# Patient Record
Sex: Female | Born: 1960
Health system: Southern US, Community
[De-identification: ages and names within clinical notes are randomized; demographics above are authoritative.]

## PROBLEM LIST (undated history)

## (undated) DIAGNOSIS — F32A Depression, unspecified: Secondary | ICD-10-CM

## (undated) DIAGNOSIS — G5700 Lesion of sciatic nerve, unspecified lower limb: Secondary | ICD-10-CM

## (undated) DIAGNOSIS — J45909 Unspecified asthma, uncomplicated: Secondary | ICD-10-CM

## (undated) DIAGNOSIS — E785 Hyperlipidemia, unspecified: Secondary | ICD-10-CM

## (undated) DIAGNOSIS — I10 Essential (primary) hypertension: Secondary | ICD-10-CM

## (undated) DIAGNOSIS — R0600 Dyspnea, unspecified: Secondary | ICD-10-CM

## (undated) DIAGNOSIS — E079 Disorder of thyroid, unspecified: Secondary | ICD-10-CM

## (undated) DIAGNOSIS — E039 Hypothyroidism, unspecified: Secondary | ICD-10-CM

## (undated) HISTORY — DX: Lesion of sciatic nerve, unspecified lower limb: G57.00

## (undated) HISTORY — DX: Unspecified asthma, uncomplicated: J45.909

## (undated) HISTORY — PX: STRABISMUS SURGERY: SHX218

## (undated) HISTORY — DX: Disorder of thyroid, unspecified: E07.9

## (undated) HISTORY — DX: Hyperlipidemia, unspecified: E78.5

## (undated) HISTORY — PX: FOOT SURGERY: SHX648

## (undated) HISTORY — DX: Essential (primary) hypertension: I10

## (undated) HISTORY — PX: TOTAL ABDOMINAL HYSTERECTOMY: SHX209

---

## 1998-11-02 ENCOUNTER — Encounter: Admission: RE | Admit: 1998-11-02 | Discharge: 1998-11-02 | Payer: Self-pay | Admitting: Internal Medicine

## 1998-11-30 ENCOUNTER — Emergency Department (HOSPITAL_COMMUNITY): Admission: EM | Admit: 1998-11-30 | Discharge: 1998-11-30 | Payer: Self-pay | Admitting: Emergency Medicine

## 1998-11-30 ENCOUNTER — Encounter: Payer: Self-pay | Admitting: Emergency Medicine

## 1999-05-02 ENCOUNTER — Emergency Department (HOSPITAL_COMMUNITY): Admission: EM | Admit: 1999-05-02 | Discharge: 1999-05-02 | Payer: Self-pay | Admitting: Emergency Medicine

## 2000-09-04 ENCOUNTER — Other Ambulatory Visit: Admission: RE | Admit: 2000-09-04 | Discharge: 2000-09-04 | Payer: Self-pay | Admitting: Obstetrics and Gynecology

## 2003-01-19 ENCOUNTER — Emergency Department (HOSPITAL_COMMUNITY): Admission: EM | Admit: 2003-01-19 | Discharge: 2003-01-20 | Payer: Self-pay | Admitting: Emergency Medicine

## 2003-01-20 ENCOUNTER — Encounter: Payer: Self-pay | Admitting: Emergency Medicine

## 2003-07-07 ENCOUNTER — Ambulatory Visit (HOSPITAL_COMMUNITY): Admission: RE | Admit: 2003-07-07 | Discharge: 2003-07-07 | Payer: Self-pay | Admitting: Obstetrics and Gynecology

## 2003-07-07 ENCOUNTER — Encounter (INDEPENDENT_AMBULATORY_CARE_PROVIDER_SITE_OTHER): Payer: Self-pay | Admitting: *Deleted

## 2003-08-18 ENCOUNTER — Ambulatory Visit (HOSPITAL_COMMUNITY): Admission: RE | Admit: 2003-08-18 | Discharge: 2003-08-18 | Payer: Self-pay | Admitting: Obstetrics and Gynecology

## 2004-08-19 ENCOUNTER — Ambulatory Visit (HOSPITAL_COMMUNITY): Admission: RE | Admit: 2004-08-19 | Discharge: 2004-08-19 | Payer: Self-pay | Admitting: Obstetrics and Gynecology

## 2005-02-24 ENCOUNTER — Other Ambulatory Visit: Admission: RE | Admit: 2005-02-24 | Discharge: 2005-02-24 | Payer: Self-pay | Admitting: Family Medicine

## 2005-09-22 ENCOUNTER — Ambulatory Visit (HOSPITAL_COMMUNITY): Admission: RE | Admit: 2005-09-22 | Discharge: 2005-09-22 | Payer: Self-pay | Admitting: Obstetrics and Gynecology

## 2005-11-12 ENCOUNTER — Other Ambulatory Visit: Admission: RE | Admit: 2005-11-12 | Discharge: 2005-11-12 | Payer: Self-pay | Admitting: Obstetrics and Gynecology

## 2006-06-16 ENCOUNTER — Encounter (INDEPENDENT_AMBULATORY_CARE_PROVIDER_SITE_OTHER): Payer: Self-pay | Admitting: *Deleted

## 2006-06-16 ENCOUNTER — Ambulatory Visit (HOSPITAL_COMMUNITY): Admission: RE | Admit: 2006-06-16 | Discharge: 2006-06-17 | Payer: Self-pay | Admitting: Obstetrics and Gynecology

## 2006-10-13 ENCOUNTER — Ambulatory Visit (HOSPITAL_COMMUNITY): Admission: RE | Admit: 2006-10-13 | Discharge: 2006-10-13 | Payer: Self-pay | Admitting: Obstetrics and Gynecology

## 2007-11-16 ENCOUNTER — Ambulatory Visit (HOSPITAL_COMMUNITY): Admission: RE | Admit: 2007-11-16 | Discharge: 2007-11-16 | Payer: Self-pay | Admitting: Obstetrics and Gynecology

## 2007-11-16 ENCOUNTER — Encounter: Admission: RE | Admit: 2007-11-16 | Discharge: 2007-11-16 | Payer: Self-pay | Admitting: *Deleted

## 2007-11-16 ENCOUNTER — Encounter (INDEPENDENT_AMBULATORY_CARE_PROVIDER_SITE_OTHER): Payer: Self-pay | Admitting: Interventional Radiology

## 2007-11-16 ENCOUNTER — Other Ambulatory Visit: Admission: RE | Admit: 2007-11-16 | Discharge: 2007-11-16 | Payer: Self-pay | Admitting: Interventional Radiology

## 2007-12-02 ENCOUNTER — Encounter: Admission: RE | Admit: 2007-12-02 | Discharge: 2007-12-02 | Payer: Self-pay | Admitting: *Deleted

## 2008-03-30 ENCOUNTER — Encounter: Admission: RE | Admit: 2008-03-30 | Discharge: 2008-03-30 | Payer: Self-pay | Admitting: Obstetrics and Gynecology

## 2008-04-12 ENCOUNTER — Encounter: Admission: RE | Admit: 2008-04-12 | Discharge: 2008-04-12 | Payer: Self-pay | Admitting: Endocrinology

## 2008-11-21 ENCOUNTER — Ambulatory Visit (HOSPITAL_COMMUNITY): Admission: RE | Admit: 2008-11-21 | Discharge: 2008-11-21 | Payer: Self-pay | Admitting: Obstetrics and Gynecology

## 2009-05-19 ENCOUNTER — Encounter: Admission: RE | Admit: 2009-05-19 | Discharge: 2009-05-19 | Payer: Self-pay | Admitting: Chiropractic Medicine

## 2009-11-22 ENCOUNTER — Ambulatory Visit (HOSPITAL_COMMUNITY): Admission: RE | Admit: 2009-11-22 | Discharge: 2009-11-22 | Payer: Self-pay | Admitting: Obstetrics and Gynecology

## 2010-07-21 ENCOUNTER — Encounter: Payer: Self-pay | Admitting: Endocrinology

## 2010-07-21 ENCOUNTER — Encounter: Payer: Self-pay | Admitting: Chiropractic Medicine

## 2010-07-22 ENCOUNTER — Encounter: Payer: Self-pay | Admitting: Endocrinology

## 2010-08-26 ENCOUNTER — Other Ambulatory Visit: Payer: Self-pay | Admitting: Endocrinology

## 2010-08-26 DIAGNOSIS — E049 Nontoxic goiter, unspecified: Secondary | ICD-10-CM

## 2010-09-04 ENCOUNTER — Ambulatory Visit
Admission: RE | Admit: 2010-09-04 | Discharge: 2010-09-04 | Disposition: A | Discharge status: Home / Self Care | Payer: Managed Care, Other (non HMO) | Source: Ambulatory Visit | Attending: Endocrinology | Admitting: Endocrinology

## 2010-09-04 DIAGNOSIS — E049 Nontoxic goiter, unspecified: Secondary | ICD-10-CM

## 2010-11-15 NOTE — H&P (Signed)
NAME:  Evelyn Baker, Evelyn Baker NO.:  [id number]   MEDICAL RECORD NO.:  [id number]          PATIENT TYPE:  AMB   LOCATION:                                [facility]:  [facility]   PHYSICIAN:  Janine Limbo, M.D.DATE OF BIRTH:  04-Jan-1961   DATE OF ADMISSION:  06/16/2006  DATE OF DISCHARGE:                              HISTORY & PHYSICAL   HISTORY OF PRESENT ILLNESS:  Evelyn Baker is a 50 year old female, para 4-  1-0-4, who presents for a vaginal hysterectomy.  The patient has been  followed at the [facility] and [facility]cology [facility] of  [facility].  The patient has a known history of  fibroids, menorrhagia, and dysmenorrhea.  An endometrial biopsy was  performed and it showed benign elements.  The patient's most recent Pap  smear was within normal limits.  Gonorrhea and Chlamydia cultures of the  cervix were negative. The patient has had a hysteroscopy with dilatation  and curettage.  This was performed in 2005.  The findings at the time  included endometrial polyps.  The pathology returned showing benign  cells.  The patient is prepared at this time to proceed with definitive  therapy.  The patient has tried hormone therapy in the past but she  continued to have discomfort.   OBSTETRICAL HISTORY:  The patient has had 4 term vaginal deliveries and  1 preterm stillbirth.   PAST MEDICAL HISTORY:  The patient has a history of hypothyroidism and  she currently taking Synthroid.  She had her wisdom teeth removed.  The  patient has had a laparoscopic tubal cautery and a diagnostic  laparoscopy in the past.  The patient has also had a prior dilatation  and curettage.   DRUG ALLERGIES:  The patient reports that she is ALLERGIC TO IODINE AND  SHELLFISH.   SOCIAL HISTORY:  The patient denies cigarette use, alcohol use, and  recreational drug use.  She is married.  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   REVIEW OF  SYSTEMS:  The patient has a history of occasional dyspareunia.   FAMILY HISTORY:  The patient's mother and father have hypertension.  Her  paternal grandmother has had a myocardial infarction in the past.   PHYSICAL EXAM:  Weight is 204 pounds.  Height is 5 feet 5 inches.  HEENT:  Within normal limits.  CHEST:  Clear.  HEART:  Regular rate and rhythm.  BREASTS:  Without masses.  ABDOMEN:  Nontender.  EXTREMITIES:  Grossly normal.  NEUROLOGIC:  Grossly normal.  PELVIC:  External genitalia is normal. Vagina is normal.  Cervix is  nontender.  Uterus is 10 weeks' size and irregular.  Adnexa no masses  and rectovaginal exam confirms.   LABORATORY VALUES:  The patient has an ultrasound performed and it  showed a uterus that measured 10 cm x 6 cm.  Multiple fibroids were  noted.  The ovaries were within normal limits.   ASSESSMENT:  1. Fibroid uterus.  2. Menorrhagia.  3. Dysmenorrhea.  4. Hypothyroidism.  5. Jehovah's Witness.  PLAN:  The patient will undergo a vaginal hysterectomy.  She understands  the indications for her surgical procedure and she accepts the risk of,  but not limited to, anesthetic complications, bleeding, infections, and  possible damage to the surrounding organs.      Janine Limbo, M.D.  Electronically Signed     AVS/MEDQ  D:  06/15/2006  T:  06/15/2006  Job:  [id number]

## 2010-11-15 NOTE — H&P (Signed)
NAME:  Evelyn Baker, GANGL                        ACCOUNT NO.:  [id number]   MEDICAL RECORD NO.:  [id number]                   PATIENT TYPE:  AMB   LOCATION:  SDC                                  [facility]ILITY:  [facility]   PHYSICIAN:  Janine Limbo, M.D.            DATE OF BIRTH:  [birth year]   DATE OF ADMISSION:  DATE OF DISCHARGE:                                HISTORY & PHYSICAL   DATE OF SURGERY:  July 07, 2003   HISTORY OF PRESENT ILLNESS:  Evelyn Baker is a 50 year old female, para 4-1-0-  4, who presents for a hysteroscopy and D&C because of irregular cycles and  an endometrial polyp.  The patient has been followed at the [facility] and Gynecology division of [facility].  An  endometrial biopsy showed benign elements.  A hydrosonogram showed a 9.1 x  5.6 cm uterus with an endometrium that measured 0.72 cm.  There was a 0.94  cm lesion seen within the endometrium that was on a stalk.  This was thought  to be consistent with an endometrial polyp.  The patient's most recent Pap  smear was in June 2004 and it was said to be within normal limits.   OBSTETRICAL HISTORY:  The patient has had four term vaginal deliveries and  one preterm stillbirth.   PAST MEDICAL HISTORY:  The patient has a history of thyroid disease.  She is  currently taking Synthroid.  She has also had her wisdom teeth removed as  well as other teeth.  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  Her biopsy, however, was normal.  The patient has had a  D&C in the past.  She also has had a laparoscopy and a tubal ligation in the  past.   DRUG ALLERGIES:  SHELLFISH.   SOCIAL HISTORY:  The patient denies cigarette use, alcohol use, and  recreational drug use.   REVIEW OF SYSTEMS:  Noncontributory.   FAMILY HISTORY:  Noncontributory.   PHYSICAL EXAMINATION:  VITAL SIGNS:  Weight is 176 pounds, height is 5 feet  5 inches.  HEENT:  Within normal  limits.  CHEST:  Clear.  HEART:  Regular rate and rhythm.  BREASTS:  Without masses.  ABDOMEN:  Nontender.  EXTREMITIES:  Within normal limits.  NEUROLOGIC:  Grossly normal.  PELVIC:  External genitalia is normal.  Vagina is normal.  Cervix is  nontender.  Uterus is upper limits of normal size.  Adnexa with no masses.   ASSESSMENT:  1. Irregular cycles.  2. Endometrial polyp.   PLAN:  The patient will undergo an hysteroscopy with dilatation and  curettage the understands the indications for her procedure and she accepts  the risks of, but not limited to, anesthetic complications, bleeding,  infections, and possible damage to the surrounding organs.  Janine Limbo, M.D.    AVS/MEDQ  D:  07/05/2003  T:  07/05/2003  Job:  [id number]   cc:   Al Decant. Janey Greaser, MD  [address]  [place]  [place] [address]  Fax: [phone]

## 2010-11-15 NOTE — Op Note (Signed)
NAME:  Evelyn Baker, Evelyn Baker                        ACCOUNT NO.:  [id number]   MEDICAL RECORD NO.:  [id number]                   PATIENT TYPE:  AMB   LOCATION:  SDC                                  [facility]   PHYSICIAN:  Janine Limbo, M.D.            DATE OF BIRTH:  01-11-1961   DATE OF PROCEDURE:  07/07/2003  DATE OF DISCHARGE:                                 OPERATIVE REPORT   PREOPERATIVE DIAGNOSES:  1. Irregular bleeding.  2. Endometrial polyp.   POSTOPERATIVE DIAGNOSES:  1. Irregular bleeding.  2. Endometrial polyp.   PROCEDURE:  Hysteroscopy with dilatation and curettage.   SURGEON:  Janine Limbo, M.D.   ANESTHESIA:  General.   DISPOSITION:  [name] is a 50 year old female, para 4-1-0-4, who  presents with irregular bleeding.  A hydrosonogram showed a 0.94 cm lesion  within the endometrium that was on a stalk.  This was thought to be  consistent with an endometrial polyp.  The patient understands the  indications for her procedure and she accepts the risks of, but not limited  to, anesthetic complications, bleeding, infections, and possible damage to  the surrounding organs.   FINDINGS:  The uterus was upper limits normal size.  The uterus sounded to 9  cm.  A small endometrial polyp was seen inside the uterine cavity.  There  were no pathologic lesions seen.   DESCRIPTION OF PROCEDURE:  The patient was taken to the operating room,  where a general anesthetic was given.  The patient's abdomen, perineum, and  vagina were prepped with multiple layers of Betadine.  A Foley catheter was  placed in the bladder.  The patient was sterilely draped.  Examination under  anesthesia was performed.  A paracervical block was placed using 10 mL of  0.5% Marcaine with epinephrine.  An endocervical curettage was then  performed.  The uterus sounded to 9 cm.  The cervix was gradually dilated.  The diagnostic hysteroscope was inserted and the cavity was carefully  inspected.  Pictures were taken.  The cavity was then explored using Randall  stone forceps and then the cavity was curetted using a medium sharp curette.  The cavity was felt to be clean at the end of our procedure.  The diagnostic  hysteroscope was reinspected and the cavity was carefully inspected.  No  lesions were noted.  Again pictures were taken.  All instruments were  removed.  There was bleeding from the cervix, and silver nitrate was  applied.  At this point hemostasis was thought to be adequate throughout.  Repeat exam showed the uterus to be firm.  The patient was then awakened  from her anesthetic and returned to the supine position.  She was taken to  the recovery room in stable condition.  She was given 30 mg of IV Toradol  during her procedure.   FOLLOW-UP INSTRUCTIONS:  The patient was given a prescription for Darvocet-N  100, and she will take one tablet every four to six hours as needed for  pain.  She was given 20 tablets with one refill.  She will return to see Dr.  Stefano Gaul in two to three weeks for a follow-up examination.  She was given a  copy of the postoperative instruction sheet as prepared by the [facility] for patients who have undergone a dilatation and  curettage.                                               Janine Limbo, M.D.    AVS/MEDQ  D:  07/07/2003  T:  07/07/2003  Job:  [id number]   cc:   Al Decant. Janey Greaser, MD  [address]  [place]  [place] [address]  Fax: [phone]

## 2010-11-15 NOTE — Op Note (Signed)
NAME:  [name], [name] NO.:  [id number]   MEDICAL RECORD NO.:  [id number]          PATIENT TYPE:  AMB   LOCATION:  SDC                           [facility]   PHYSICIAN:  Janine Limbo, M.D.DATE OF BIRTH:  [birth year]   DATE OF PROCEDURE:  06/16/2006  DATE OF DISCHARGE:                               OPERATIVE REPORT   PREOPERATIVE DIAGNOSES:  1. Ten-week size fibroid uterus.  2. Menorrhagia.  3. Dysmenorrhea.   POSTOPERATIVE DIAGNOSIS:  1. Ten-week size fibroid uterus.  2. Menorrhagia.  3. Dysmenorrhea.  4. Pelvic adhesions.   PROCEDURES:  1. Vaginal hysterectomy.  2. Lysis of adhesions.   SURGEON:  Janine Limbo, M.D.   FIRST ASSISTANT:  Osborn Coho, M.D.   ANESTHETIC:  General.   DISPOSITION:  Evelyn Baker is a 50 year old female, para 4 -1-0-4, who  presents with the above-mentioned diagnosis.  The patient understands  the indications for her surgical procedure and she accepts the risks of,  but not limited to, anesthetic complications, bleeding, infections, and  possible damage to the surrounding organs.   FINDINGS:  The uterus weighed approximately 170 g, and it contained  multiple fibroids.  There were adhesions present between the left pelvic  sidewall and the left ovary.  There were also adhesions in the left  fallopian tube.  There were a few adhesions in the posterior cul-de-sac.  The adhesions were lysed so that we could remove the uterus.  The right  fallopian tube and the right ovary appeared normal.   PROCEDURE:  The patient was taken to the operating room, where a general  anesthetic was given.  The patient's abdomen, perineum, and vagina were  prepped with multiple layers of Hibiclens.  Examination under anesthesia  was performed.  A Foley catheter was placed in the bladder.  The patient  was then sterilely draped.  The cervix was injected with 30 mL of a  diluted solution of Pitressin and saline.  A circumferential  incision  was made around the cervix and the vaginal mucosa was advanced  anteriorly and posteriorly.  The posterior cul-de-sac and then the  anterior cul-de-sac was sharply entered.  Alternating from right to left  the uterosacral ligaments, paracervical tissues, and the parametrial  tissues were clamped, cut, sutured, and tied securely.  Adhesions were  noted and the adhesions were lysed so that we could properly isolate the  uterine arteries.  The uterine arteries and then the upper pedicles were  then clamped, cut, sutured, and tied securely.  The uterus was inverted  through the posterior colpotomy.  The remainder of the upper pedicle was  then clamped and cut and the uterus was removed from the operative  field.  The upper pedicles were then secured using a free tie and then a  suture ligature.  Again adhesions were lysed on the left pelvic sidewall  so that we could properly isolate the pedicle.  Hemostasis was noted to  be adequate in the pelvis.  There was some bleeding from the vaginal  cuff and figure-of-eight sutures were placed.  The sutures attached  to  the uterosacral ligaments were then brought out through the vaginal  angles and tied securely.  A McCall culdoplasty suture was placed in the  posterior cul-de-sac incorporating the posterior vaginal mucosa and the  uterosacral ligaments bilaterally.  We again inspected the pelvis and  there was no bleeding from the pelvis.  We again noted some bleeding  from the vaginal cuff, but this was isolated using Allis clamps.  After  we were comfortable that hemostasis was adequate, we then began to close  the posterior cuff.  Figure-of-eight sutures were used incorporating the  anterior vaginal mucosa, the anterior peritoneum, the posterior  peritoneum, and then the posterior vaginal mucosa.  The McCall  culdoplasty suture was tied and the apex of vagina was noted to elevate  into the mid pelvis.  Hemostasis was adequate throughout.   Sponge,  needle, and instrument counts were correct.  The estimated blood loss  for the procedure was 300 mL.  The patient had a urine output of  approximately 100 mL of clear urine.  The patient received 1.5 L of IV  fluid.  She received a gram of Ancef 1 hour prior to surgery.  She  received 30 mg of Toradol IV and 30 mg of Toradol IM during the  operative procedure.  The patient was awakened from her anesthetic and  taken to the recovery room in stable condition.  The uterus was taken to  pathology for evaluation.  The adhesions were taken to pathology for  evaluation.      Janine Limbo, M.D.  Electronically Signed     AVS/MEDQ  D:  06/16/2006  T:  06/16/2006  Job:  [id number]

## 2010-12-06 ENCOUNTER — Other Ambulatory Visit (HOSPITAL_COMMUNITY): Payer: Self-pay | Admitting: Obstetrics and Gynecology

## 2010-12-06 DIAGNOSIS — Z1231 Encounter for screening mammogram for malignant neoplasm of breast: Secondary | ICD-10-CM

## 2010-12-26 ENCOUNTER — Ambulatory Visit (HOSPITAL_COMMUNITY): Payer: Managed Care, Other (non HMO)

## 2011-01-22 ENCOUNTER — Ambulatory Visit (HOSPITAL_COMMUNITY)
Admission: RE | Admit: 2011-01-22 | Discharge: 2011-01-22 | Disposition: A | Discharge status: Home / Self Care | Payer: Managed Care, Other (non HMO) | Source: Ambulatory Visit | Attending: Obstetrics and Gynecology | Admitting: Obstetrics and Gynecology

## 2011-01-22 DIAGNOSIS — Z1231 Encounter for screening mammogram for malignant neoplasm of breast: Secondary | ICD-10-CM | POA: Insufficient documentation

## 2011-01-23 ENCOUNTER — Other Ambulatory Visit (HOSPITAL_COMMUNITY): Payer: Self-pay | Admitting: Obstetrics and Gynecology

## 2011-01-23 DIAGNOSIS — R928 Other abnormal and inconclusive findings on diagnostic imaging of breast: Secondary | ICD-10-CM

## 2011-02-05 ENCOUNTER — Ambulatory Visit
Admission: RE | Admit: 2011-02-05 | Discharge: 2011-02-05 | Disposition: A | Discharge status: Home / Self Care | Payer: Managed Care, Other (non HMO) | Source: Ambulatory Visit | Attending: Obstetrics and Gynecology | Admitting: Obstetrics and Gynecology

## 2011-02-05 DIAGNOSIS — R928 Other abnormal and inconclusive findings on diagnostic imaging of breast: Secondary | ICD-10-CM

## 2011-09-01 ENCOUNTER — Other Ambulatory Visit: Payer: Self-pay | Admitting: Endocrinology

## 2011-09-01 DIAGNOSIS — E049 Nontoxic goiter, unspecified: Secondary | ICD-10-CM

## 2011-09-30 ENCOUNTER — Other Ambulatory Visit: Payer: Managed Care, Other (non HMO)

## 2011-10-03 ENCOUNTER — Ambulatory Visit
Admission: RE | Admit: 2011-10-03 | Discharge: 2011-10-03 | Disposition: A | Discharge status: Home / Self Care | Payer: BC Managed Care – PPO | Source: Ambulatory Visit | Attending: Endocrinology | Admitting: Endocrinology

## 2011-10-03 DIAGNOSIS — E049 Nontoxic goiter, unspecified: Secondary | ICD-10-CM

## 2011-10-17 ENCOUNTER — Other Ambulatory Visit: Payer: Managed Care, Other (non HMO)

## 2012-02-05 ENCOUNTER — Other Ambulatory Visit: Payer: Self-pay | Admitting: Obstetrics and Gynecology

## 2012-02-05 DIAGNOSIS — Z1231 Encounter for screening mammogram for malignant neoplasm of breast: Secondary | ICD-10-CM

## 2012-02-19 ENCOUNTER — Encounter: Payer: Self-pay | Admitting: Obstetrics and Gynecology

## 2012-02-19 ENCOUNTER — Ambulatory Visit (HOSPITAL_COMMUNITY)
Admission: RE | Admit: 2012-02-19 | Discharge: 2012-02-19 | Disposition: A | Discharge status: Home / Self Care | Payer: BC Managed Care – PPO | Source: Ambulatory Visit | Attending: Obstetrics and Gynecology | Admitting: Obstetrics and Gynecology

## 2012-02-19 ENCOUNTER — Ambulatory Visit (INDEPENDENT_AMBULATORY_CARE_PROVIDER_SITE_OTHER): Payer: BC Managed Care – PPO | Admitting: Obstetrics and Gynecology

## 2012-02-19 VITALS — BP 120/82 | HR 96 | Ht 65.0 in | Wt 195.0 lb

## 2012-02-19 DIAGNOSIS — Z1231 Encounter for screening mammogram for malignant neoplasm of breast: Secondary | ICD-10-CM

## 2012-02-19 DIAGNOSIS — Z01419 Encounter for gynecological examination (general) (routine) without abnormal findings: Secondary | ICD-10-CM

## 2012-02-19 NOTE — Progress Notes (Signed)
Regular Periods: no Mammogram: yes  Monthly Breast Ex.: yes Exercise: no  Tetanus < 10 years: yes Seatbelts: yes  NI. Bladder Functn.: yes Abuse at home: no  Daily BM's: yes Stressful Work: yes  Healthy Diet: yes Sigmoid-Colonoscopy: n/a  Calcium: no Medical problems this year: back problems   LAST PAP:12/13/07  Contraception: partial hysterectomy   Mammogram:  August 2012, pt is scheduled for 1 today  PCP: Dr. Gaye Alken  PMH: none  [facility]: none  Last Bone Scan: n/a  Subjective:    Evelyn Baker is a 51 y.o. female, G5P4, who presents for an annual exam. See above. The patient recently traveled to [place] and now has diarrhea.  She sees her family physician.  Prior Hysterectomy: Yes    History   Social History  . Marital Status: Married    Spouse Name: N/A    Number of Children: N/A  . Years of Education: N/A   Social History Main Topics  . Smoking status: Never Smoker   . Smokeless tobacco: None  . Alcohol Use: No  . Drug Use: No  . Sexually Active: Yes    Birth Control/ Protection: Surgical     partial hysterectomy   Other Topics Concern  . None   Social History Narrative  . None    Menstrual cycle:   LMP: No LMP recorded. Patient has had a hysterectomy.           Cycle: no cycle due to hysterectomy  The following portions of the patient's history were reviewed and updated as appropriate: allergies, current medications, past family history, past medical history, past social history, past surgical history and problem list.  Review of Systems Pertinent items are noted in HPI. Breast:Negative for breast lump,nipple discharge or nipple retraction Gastrointestinal: Negative for abdominal pain, change in bowel habits or rectal bleeding Urinary:negative   Objective:    BP 120/82  Pulse 96  Ht 5\' 5"  (1.651 m)  Wt 195 lb (88.451 kg)  BMI 32.45 kg/m2    Weight:  Wt Readings from Last 1 Encounters:  02/19/12 195 lb (88.451 kg)          BMI:  Body mass index is 32.45 kg/(m^2).  General Appearance: Alert, appropriate appearance for age. No acute distress HEENT: Grossly normal Neck / Thyroid: Supple, no masses, nodes or enlargement Lungs: clear to auscultation bilaterally Back: No CVA tenderness Breast Exam: No masses or nodes.No dimpling, nipple retraction or discharge. Cardiovascular: Regular rate and rhythm. S1, S2, no murmur Gastrointestinal: Soft, non-tender, no masses or organomegaly  ++++++++++++++++++++++++++++++++++++++++++++++++++++++++  Pelvic Exam: External genitalia: normal general appearance Vaginal: normal without tenderness, induration or masses Adnexa: normal bimanual exam Rectovaginal: normal rectal, no masses  ++++++++++++++++++++++++++++++++++++++++++++++++++++++++  Lymphatic Exam: Non-palpable nodes in neck, clavicular, axillary, or inguinal regions Neurologic: Normal speech, no tremor  Psychiatric: Alert and oriented, appropriate affect.   Wet Prep:not applicable Urinalysis:not applicable UPT: Not done   Assessment:    Normal gyn exam   Overweight or obese: Yes   Pelvic relaxation: Yes   Plan:    mammogram return annually or prn Contraception:no method    STD screen request: No   The updated Pap smear screening guidelines were discussed with the patient. The patient requested that I obtain a Pap smear: No.  Proper diet and regular exercise were reviewed.  Annual mammograms recommended starting at age 17. Proper breast care was discussed.  Screening colonoscopy is recommended beginning at age 70.  Regular health maintenance was reviewed.  Sleep hygiene was discussed.  Adequate calcium and vitamin D intake was emphasized.  Mylinda Latina.D.

## 2012-02-24 ENCOUNTER — Other Ambulatory Visit: Payer: Self-pay | Admitting: Obstetrics and Gynecology

## 2012-02-24 DIAGNOSIS — R928 Other abnormal and inconclusive findings on diagnostic imaging of breast: Secondary | ICD-10-CM

## 2012-02-26 ENCOUNTER — Encounter: Payer: Self-pay | Admitting: Obstetrics and Gynecology

## 2012-03-05 ENCOUNTER — Ambulatory Visit
Admission: RE | Admit: 2012-03-05 | Discharge: 2012-03-05 | Disposition: A | Discharge status: Home / Self Care | Payer: BC Managed Care – PPO | Source: Ambulatory Visit | Attending: Obstetrics and Gynecology | Admitting: Obstetrics and Gynecology

## 2012-03-05 DIAGNOSIS — R928 Other abnormal and inconclusive findings on diagnostic imaging of breast: Secondary | ICD-10-CM

## 2012-08-02 ENCOUNTER — Other Ambulatory Visit: Payer: Self-pay | Admitting: Obstetrics and Gynecology

## 2012-08-02 DIAGNOSIS — R921 Mammographic calcification found on diagnostic imaging of breast: Secondary | ICD-10-CM

## 2012-08-02 DIAGNOSIS — C50919 Malignant neoplasm of unspecified site of unspecified female breast: Secondary | ICD-10-CM

## 2012-09-01 ENCOUNTER — Other Ambulatory Visit: Payer: Self-pay | Admitting: Endocrinology

## 2012-09-01 DIAGNOSIS — E049 Nontoxic goiter, unspecified: Secondary | ICD-10-CM

## 2012-09-10 ENCOUNTER — Ambulatory Visit
Admission: RE | Admit: 2012-09-10 | Discharge: 2012-09-10 | Disposition: A | Discharge status: Home / Self Care | Payer: BC Managed Care – PPO | Source: Ambulatory Visit | Attending: Obstetrics and Gynecology | Admitting: Obstetrics and Gynecology

## 2012-09-10 ENCOUNTER — Other Ambulatory Visit: Payer: Self-pay | Admitting: Obstetrics and Gynecology

## 2012-09-10 ENCOUNTER — Ambulatory Visit
Admission: RE | Admit: 2012-09-10 | Discharge: 2012-09-10 | Disposition: A | Discharge status: Home / Self Care | Payer: BC Managed Care – PPO | Source: Ambulatory Visit | Attending: Endocrinology | Admitting: Endocrinology

## 2012-09-10 DIAGNOSIS — R921 Mammographic calcification found on diagnostic imaging of breast: Secondary | ICD-10-CM

## 2012-09-10 DIAGNOSIS — E049 Nontoxic goiter, unspecified: Secondary | ICD-10-CM

## 2012-09-17 ENCOUNTER — Ambulatory Visit
Admission: RE | Admit: 2012-09-17 | Discharge: 2012-09-17 | Disposition: A | Discharge status: Home / Self Care | Payer: BC Managed Care – PPO | Source: Ambulatory Visit | Attending: Obstetrics and Gynecology | Admitting: Obstetrics and Gynecology

## 2012-09-17 DIAGNOSIS — R921 Mammographic calcification found on diagnostic imaging of breast: Secondary | ICD-10-CM

## 2013-03-11 ENCOUNTER — Other Ambulatory Visit: Payer: Self-pay | Admitting: Obstetrics and Gynecology

## 2013-03-11 DIAGNOSIS — Z1231 Encounter for screening mammogram for malignant neoplasm of breast: Secondary | ICD-10-CM

## 2013-03-18 ENCOUNTER — Ambulatory Visit (HOSPITAL_COMMUNITY)
Admission: RE | Admit: 2013-03-18 | Discharge: 2013-03-18 | Disposition: A | Discharge status: Home / Self Care | Payer: BC Managed Care – PPO | Source: Ambulatory Visit | Attending: Obstetrics and Gynecology | Admitting: Obstetrics and Gynecology

## 2013-03-18 DIAGNOSIS — Z1231 Encounter for screening mammogram for malignant neoplasm of breast: Secondary | ICD-10-CM | POA: Insufficient documentation

## 2014-01-16 IMAGING — MG MM DIGITAL SCREENING BILAT
6 series · 6 of 6 positions shown · non-contrast
Comparison: Previous exams.

CLINICAL DATA: Screening.

DIGITAL BILATERAL SCREENING MAMMOGRAM WITH CAD

[R CC]
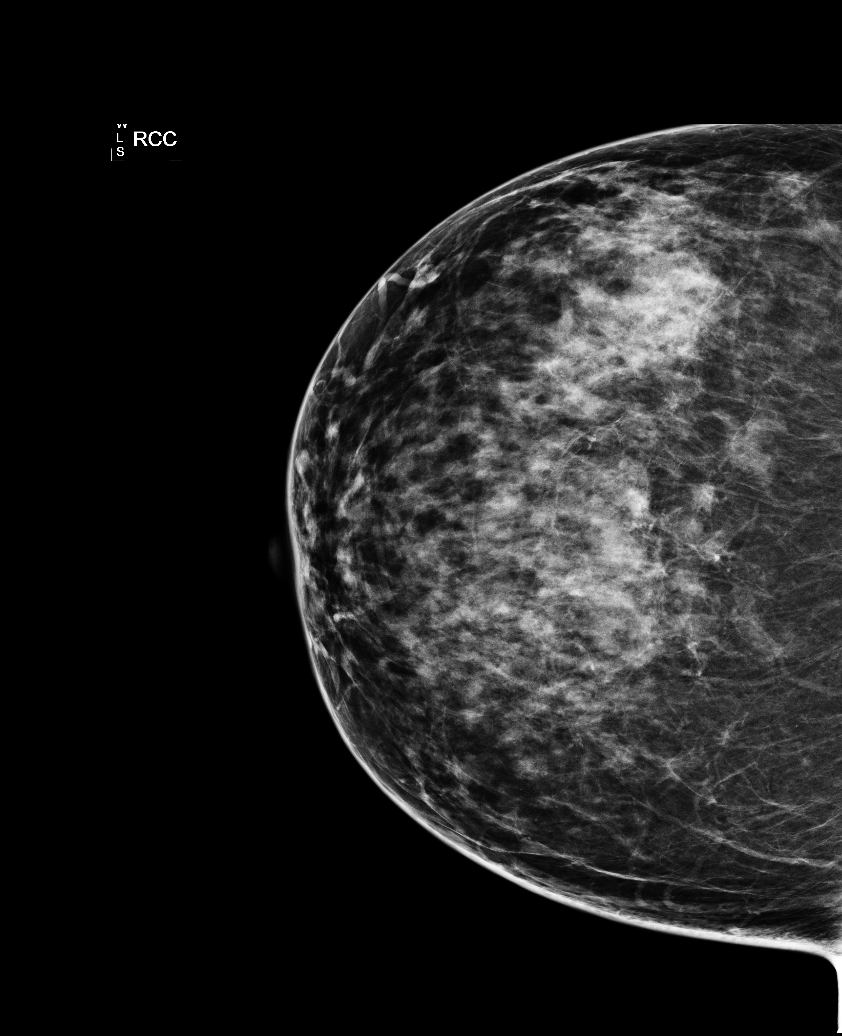

[R MLO (1 of 2)]
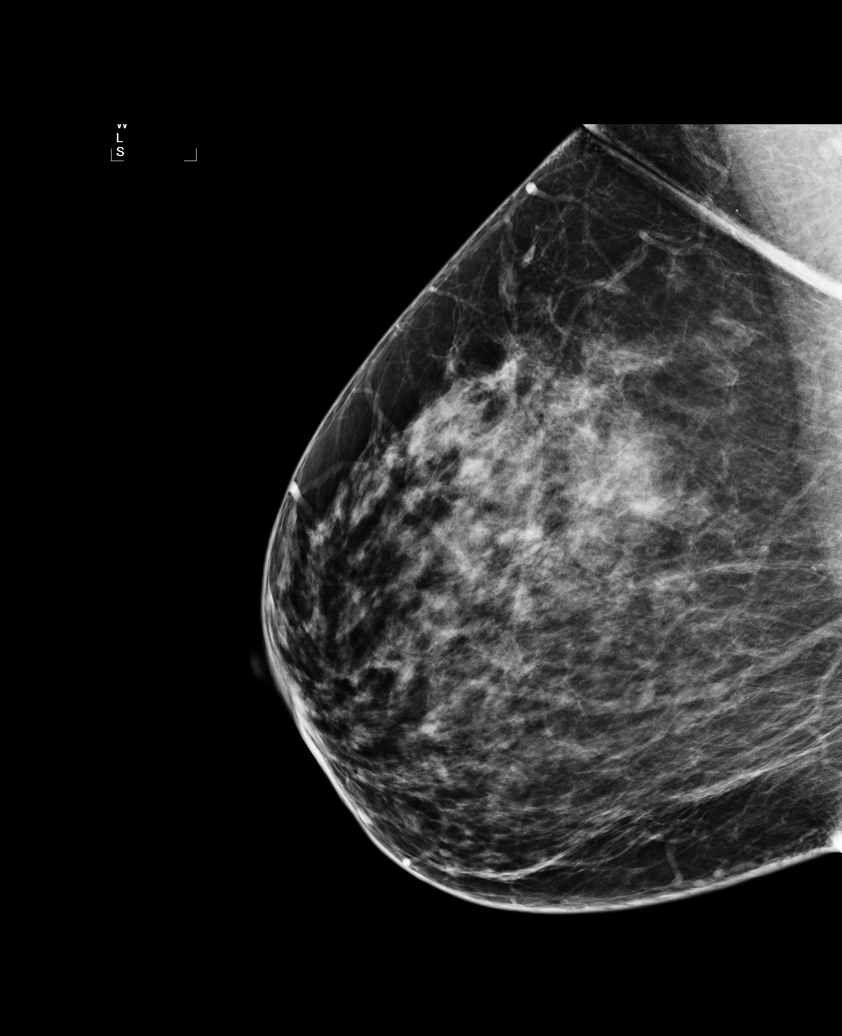

[L CC]
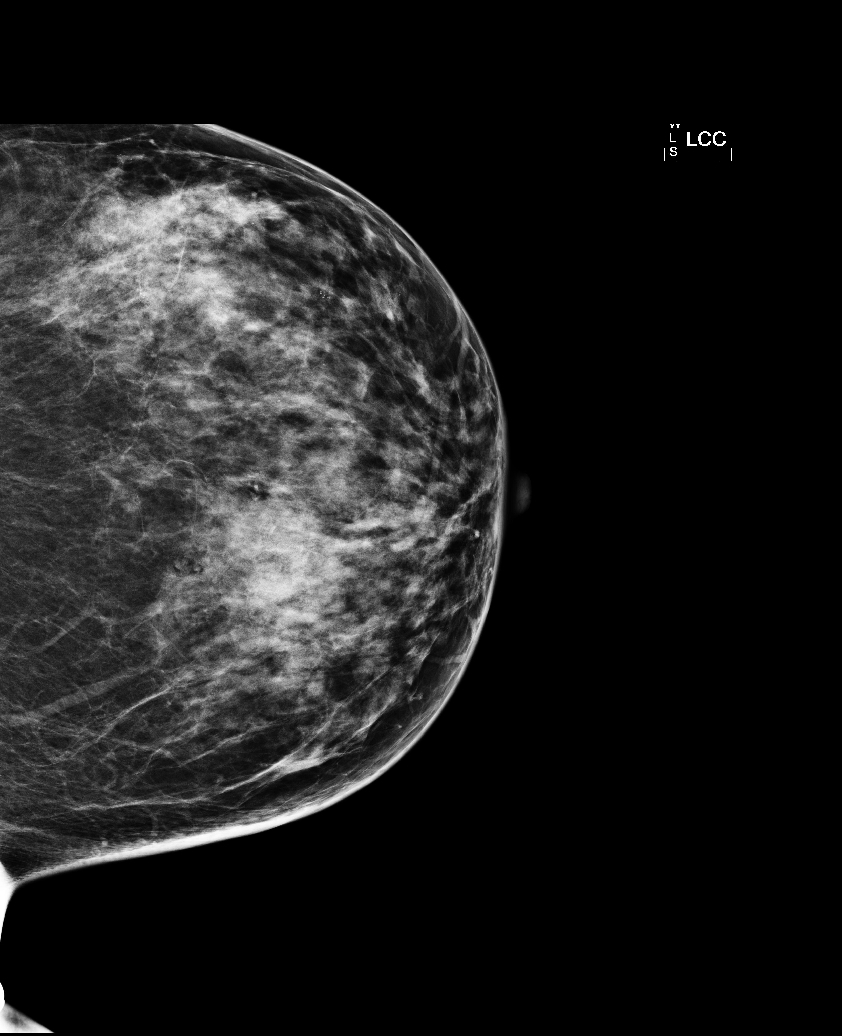

[L MLO (1 of 2)]
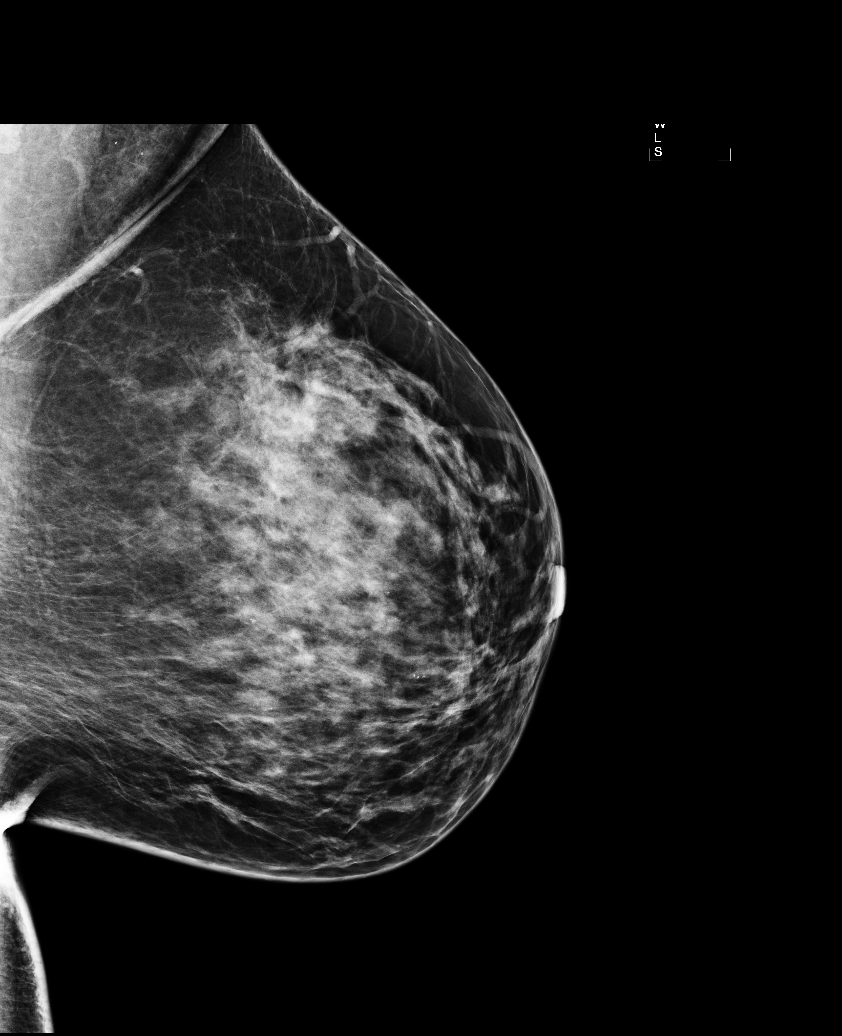

[L MLO (2 of 2)]
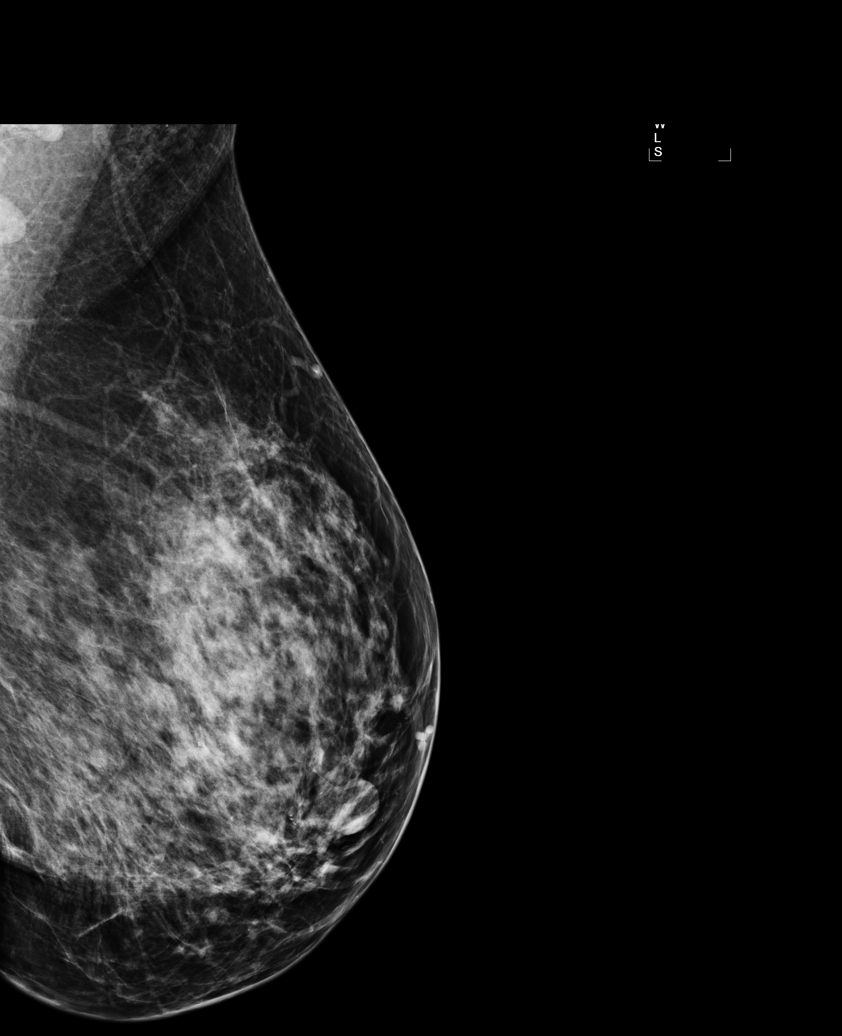

[R MLO (2 of 2)]
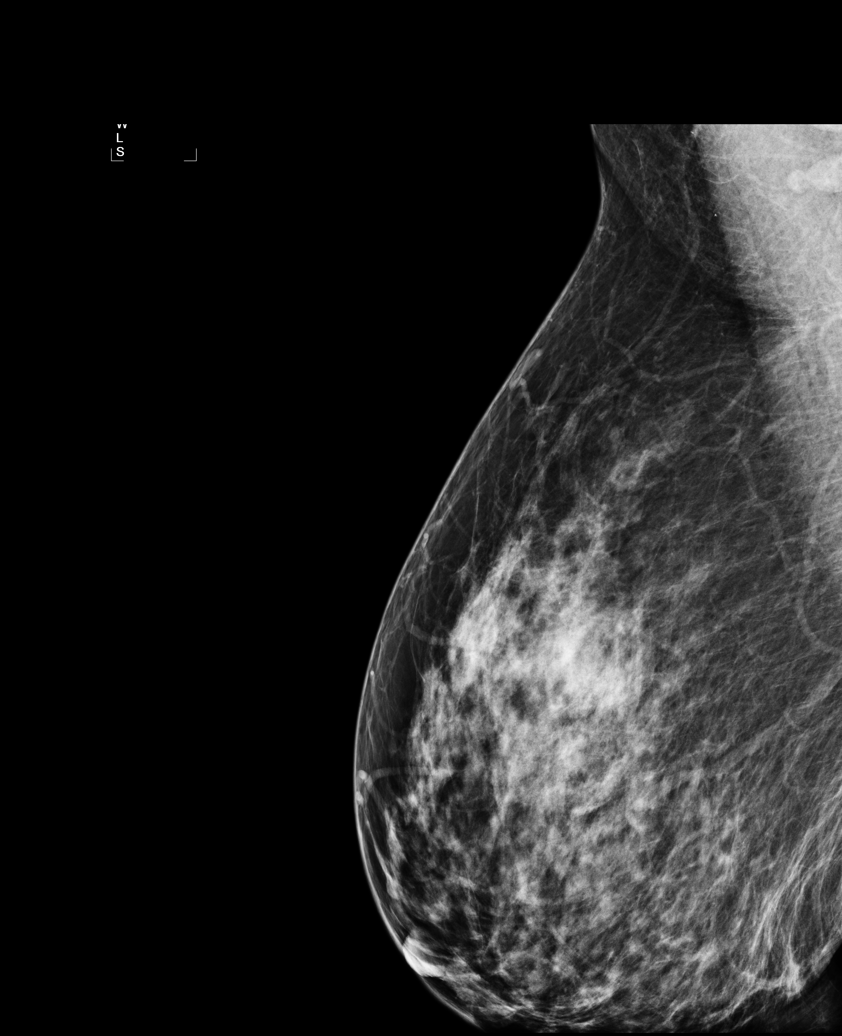

[6 of 6 positions shown; findings below may reference images not displayed]

FINDINGS: Two views of each breast demonstrate heterogeneously
dense tissue.  In the right breast, calcifications warrant further
evaluation with magnified views.  In the left breast, no mass or
malignant type calcifications are identified.

Images were processed with CAD.
IMPRESSION: Further evaluation is suggested for calcifications in the right
breast.

RECOMMENDATION:
Diagnostic mammogram of the right breast. (Code:LA-1-774)

BI-RADS CATEGORY 0:  Incomplete.  Need additional imaging
evaluation and/or prior mammograms for comparison.

## 2014-03-31 ENCOUNTER — Other Ambulatory Visit: Payer: Self-pay | Admitting: Endocrinology

## 2014-03-31 DIAGNOSIS — E049 Nontoxic goiter, unspecified: Secondary | ICD-10-CM

## 2014-04-03 ENCOUNTER — Other Ambulatory Visit (HOSPITAL_COMMUNITY): Payer: Self-pay | Admitting: Obstetrics and Gynecology

## 2014-04-03 DIAGNOSIS — Z1231 Encounter for screening mammogram for malignant neoplasm of breast: Secondary | ICD-10-CM

## 2014-04-07 ENCOUNTER — Ambulatory Visit (HOSPITAL_COMMUNITY)
Admission: RE | Admit: 2014-04-07 | Discharge: 2014-04-07 | Disposition: A | Discharge status: Home / Self Care | Payer: 59 | Source: Ambulatory Visit | Attending: Obstetrics and Gynecology | Admitting: Obstetrics and Gynecology

## 2014-04-07 ENCOUNTER — Ambulatory Visit (HOSPITAL_COMMUNITY): Payer: BC Managed Care – PPO

## 2014-04-07 DIAGNOSIS — Z1231 Encounter for screening mammogram for malignant neoplasm of breast: Secondary | ICD-10-CM | POA: Diagnosis present

## 2014-04-14 ENCOUNTER — Ambulatory Visit
Admission: RE | Admit: 2014-04-14 | Discharge: 2014-04-14 | Disposition: A | Discharge status: Home / Self Care | Payer: 59 | Source: Ambulatory Visit | Attending: Endocrinology | Admitting: Endocrinology

## 2014-04-14 DIAGNOSIS — E049 Nontoxic goiter, unspecified: Secondary | ICD-10-CM

## 2014-05-01 ENCOUNTER — Encounter: Payer: Self-pay | Admitting: Obstetrics and Gynecology

## 2015-04-09 ENCOUNTER — Other Ambulatory Visit: Payer: Self-pay | Admitting: Endocrinology

## 2015-04-09 DIAGNOSIS — E049 Nontoxic goiter, unspecified: Secondary | ICD-10-CM

## 2015-04-18 ENCOUNTER — Other Ambulatory Visit: Payer: Self-pay

## 2015-04-20 ENCOUNTER — Other Ambulatory Visit: Payer: Self-pay

## 2015-04-24 ENCOUNTER — Ambulatory Visit
Admission: RE | Admit: 2015-04-24 | Discharge: 2015-04-24 | Disposition: A | Discharge status: Home / Self Care | Payer: 59 | Source: Ambulatory Visit | Attending: Endocrinology | Admitting: Endocrinology

## 2015-04-24 DIAGNOSIS — E049 Nontoxic goiter, unspecified: Secondary | ICD-10-CM

## 2015-05-03 ENCOUNTER — Other Ambulatory Visit: Payer: Self-pay | Admitting: Endocrinology

## 2015-05-03 DIAGNOSIS — E049 Nontoxic goiter, unspecified: Secondary | ICD-10-CM

## 2015-07-26 MED FILL — AMLODIPINE BESYLATE 2.5 MG: 2.5 | 30 days supply | Qty: 30 | Fill #0

## 2015-07-26 MED FILL — LEVOTHYROXINE 137 MCG TAB: 137 | 30 days supply | Qty: 30 | Fill #1

## 2015-07-26 MED FILL — HYDROCHLOROTHIAZIDE 25 MG T: 25 | 30 days supply | Qty: 30 | Fill #0

## 2015-07-27 ENCOUNTER — Other Ambulatory Visit: Payer: Self-pay

## 2015-07-27 DIAGNOSIS — Z1231 Encounter for screening mammogram for malignant neoplasm of breast: Secondary | ICD-10-CM

## 2015-08-08 ENCOUNTER — Ambulatory Visit
Admission: RE | Admit: 2015-08-08 | Discharge: 2015-08-08 | Disposition: A | Discharge status: Home / Self Care | Payer: 59 | Source: Ambulatory Visit

## 2015-08-08 DIAGNOSIS — Z1231 Encounter for screening mammogram for malignant neoplasm of breast: Secondary | ICD-10-CM | POA: Diagnosis not present

## 2015-08-23 DIAGNOSIS — E039 Hypothyroidism, unspecified: Secondary | ICD-10-CM | POA: Diagnosis not present

## 2015-08-28 DIAGNOSIS — E039 Hypothyroidism, unspecified: Secondary | ICD-10-CM | POA: Diagnosis not present

## 2015-08-28 DIAGNOSIS — E049 Nontoxic goiter, unspecified: Secondary | ICD-10-CM | POA: Diagnosis not present

## 2015-08-28 MED FILL — LEVOTHYROXINE 112 MCG TAB: 112 | 30 days supply | Qty: 30 | Fill #0

## 2015-08-30 DIAGNOSIS — I10 Essential (primary) hypertension: Secondary | ICD-10-CM | POA: Diagnosis not present

## 2015-08-30 DIAGNOSIS — E039 Hypothyroidism, unspecified: Secondary | ICD-10-CM | POA: Diagnosis not present

## 2015-08-30 DIAGNOSIS — J4520 Mild intermittent asthma, uncomplicated: Secondary | ICD-10-CM | POA: Diagnosis not present

## 2015-08-30 DIAGNOSIS — K219 Gastro-esophageal reflux disease without esophagitis: Secondary | ICD-10-CM | POA: Diagnosis not present

## 2015-08-30 MED FILL — AMLODIPINE BESYLATE 2.5 MG: 2.5 | 90 days supply | Qty: 90 | Fill #0

## 2015-08-30 MED FILL — EPINEPHRINE 0.3 MG AUTO-INJ: 0.3 | 30 days supply | Qty: 2 | Fill #0

## 2015-08-30 MED FILL — HYDROCHLOROTHIAZIDE 25 MG T: 25 | 90 days supply | Qty: 90 | Fill #0

## 2015-10-01 MED FILL — LEVOTHYROXINE 112 MCG TAB: 112 | 30 days supply | Qty: 30 | Fill #1

## 2015-10-03 ENCOUNTER — Ambulatory Visit
Admission: RE | Admit: 2015-10-03 | Discharge: 2015-10-03 | Disposition: A | Discharge status: Home / Self Care | Payer: 59 | Source: Ambulatory Visit | Attending: Endocrinology | Admitting: Endocrinology

## 2015-10-03 DIAGNOSIS — E049 Nontoxic goiter, unspecified: Secondary | ICD-10-CM

## 2015-10-03 DIAGNOSIS — E042 Nontoxic multinodular goiter: Secondary | ICD-10-CM | POA: Diagnosis not present

## 2015-10-03 MED FILL — DENTA 5000 PLUS CREAM: 1.1 | 30 days supply | Qty: 51 | Fill #2

## 2015-10-26 DIAGNOSIS — E039 Hypothyroidism, unspecified: Secondary | ICD-10-CM | POA: Diagnosis not present

## 2015-11-02 MED FILL — LEVOTHYROXINE 100 MCG TAB: 100 | 90 days supply | Qty: 90 | Fill #0

## 2015-11-23 MED FILL — HYDROCODON-APAP 5-325: 5-325 | 5 days supply | Qty: 20 | Fill #0

## 2015-11-23 MED FILL — IBUPROFEN 600 MG TABLET: 600 | 8 days supply | Qty: 30 | Fill #0

## 2015-11-23 MED FILL — CHLORHEXIDINE 0.12% RINSE: 0.12 | 16 days supply | Qty: 473 | Fill #0

## 2015-11-23 MED FILL — CLINDAMYCIN HCL 300 MG CAP: 300 | 10 days supply | Qty: 40 | Fill #0

## 2015-12-06 DIAGNOSIS — M62838 Other muscle spasm: Secondary | ICD-10-CM | POA: Diagnosis not present

## 2015-12-11 MED FILL — CHLORHEXIDINE 0.12% RINSE: 0.12 | 17 days supply | Qty: 473 | Fill #0

## 2015-12-18 DIAGNOSIS — E039 Hypothyroidism, unspecified: Secondary | ICD-10-CM | POA: Diagnosis not present

## 2015-12-20 MED FILL — HYDROCHLOROTHIAZIDE 25 MG T: 25 | 90 days supply | Qty: 90 | Fill #1

## 2016-02-04 MED FILL — LEVOTHYROXINE 100 MCG TAB: 100 | 90 days supply | Qty: 90 | Fill #0

## 2016-04-09 DIAGNOSIS — H8110 Benign paroxysmal vertigo, unspecified ear: Secondary | ICD-10-CM | POA: Diagnosis not present

## 2016-04-09 DIAGNOSIS — I10 Essential (primary) hypertension: Secondary | ICD-10-CM | POA: Diagnosis not present

## 2016-04-09 MED FILL — MECLIZINE 25 MG TABLET: 25 | 30 days supply | Qty: 30 | Fill #0

## 2016-06-02 MED FILL — LEVOTHYROXINE 100 MCG TAB: 100 | 90 days supply | Qty: 90 | Fill #0

## 2016-06-27 DIAGNOSIS — J4520 Mild intermittent asthma, uncomplicated: Secondary | ICD-10-CM | POA: Diagnosis not present

## 2016-06-27 MED FILL — FLUTICASONE PROP 50 MCG SPR: 50 | 60 days supply | Qty: 16 | Fill #0

## 2016-07-01 DIAGNOSIS — H5213 Myopia, bilateral: Secondary | ICD-10-CM | POA: Diagnosis not present

## 2016-07-01 DIAGNOSIS — H524 Presbyopia: Secondary | ICD-10-CM | POA: Diagnosis not present

## 2016-08-26 DIAGNOSIS — E039 Hypothyroidism, unspecified: Secondary | ICD-10-CM | POA: Diagnosis not present

## 2016-09-02 DIAGNOSIS — E049 Nontoxic goiter, unspecified: Secondary | ICD-10-CM | POA: Diagnosis not present

## 2016-09-02 DIAGNOSIS — E039 Hypothyroidism, unspecified: Secondary | ICD-10-CM | POA: Diagnosis not present

## 2016-09-02 DIAGNOSIS — I10 Essential (primary) hypertension: Secondary | ICD-10-CM | POA: Diagnosis not present

## 2016-09-02 MED FILL — HYDROCHLOROTHIAZIDE 25 MG T: 25 | 30 days supply | Qty: 30 | Fill #0

## 2016-09-02 MED FILL — AMLODIPINE BESYLATE 2.5 MG: 2.5 | 30 days supply | Qty: 30 | Fill #0

## 2016-09-02 MED FILL — LEVOTHYROXINE 112 MCG TAB: 112 | 30 days supply | Qty: 30 | Fill #0

## 2016-10-06 MED FILL — LEVOTHYROXINE 112 MCG TAB: 112 | 30 days supply | Qty: 30 | Fill #1

## 2016-10-06 MED FILL — AMLODIPINE BESYLATE 2.5 MG: 2.5 | 30 days supply | Qty: 30 | Fill #1

## 2016-10-06 MED FILL — HYDROCHLOROTHIAZIDE 25 MG T: 25 | 30 days supply | Qty: 30 | Fill #1

## 2016-10-11 DIAGNOSIS — J101 Influenza due to other identified influenza virus with other respiratory manifestations: Secondary | ICD-10-CM | POA: Diagnosis not present

## 2016-10-11 DIAGNOSIS — J069 Acute upper respiratory infection, unspecified: Secondary | ICD-10-CM | POA: Diagnosis not present

## 2016-10-14 DIAGNOSIS — E039 Hypothyroidism, unspecified: Secondary | ICD-10-CM | POA: Diagnosis not present

## 2016-10-15 MED FILL — LEVOTHYROXINE 137 MCG TAB: 137 | 30 days supply | Qty: 30 | Fill #0

## 2016-10-21 DIAGNOSIS — Z01419 Encounter for gynecological examination (general) (routine) without abnormal findings: Secondary | ICD-10-CM | POA: Diagnosis not present

## 2016-10-21 DIAGNOSIS — Z01411 Encounter for gynecological examination (general) (routine) with abnormal findings: Secondary | ICD-10-CM | POA: Diagnosis not present

## 2016-10-21 DIAGNOSIS — Z1231 Encounter for screening mammogram for malignant neoplasm of breast: Secondary | ICD-10-CM | POA: Diagnosis not present

## 2016-10-21 DIAGNOSIS — Z6832 Body mass index (BMI) 32.0-32.9, adult: Secondary | ICD-10-CM | POA: Diagnosis not present

## 2016-10-27 ENCOUNTER — Institutional Professional Consult (permissible substitution): Payer: 59 | Admitting: Pulmonary Disease

## 2016-11-25 MED FILL — HYDROCHLOROTHIAZIDE 25 MG T: 25 | 90 days supply | Qty: 90 | Fill #2

## 2016-11-25 MED FILL — LEVOTHYROXINE 112 MCG TAB: 112 | 30 days supply | Qty: 30 | Fill #2

## 2016-11-25 MED FILL — AMLODIPINE BESYLATE 2.5 MG: 2.5 | 90 days supply | Qty: 90 | Fill #2

## 2016-11-27 MED FILL — LEVOTHYROXINE 137 MCG TAB: 137 | 30 days supply | Qty: 30 | Fill #1

## 2016-12-26 MED FILL — LEVOTHYROXINE 137 MCG TAB: 137 | 30 days supply | Qty: 30 | Fill #2

## 2017-03-19 MED FILL — OMEPRAZOLE DR 40 MG CAPSULE: 40 | 90 days supply | Qty: 90 | Fill #0

## 2017-03-19 MED FILL — LEVOTHYROXINE 137 MCG TAB: 137 | 90 days supply | Qty: 90 | Fill #0

## 2017-04-23 DIAGNOSIS — I10 Essential (primary) hypertension: Secondary | ICD-10-CM | POA: Diagnosis not present

## 2017-04-23 DIAGNOSIS — R400 Somnolence: Secondary | ICD-10-CM | POA: Diagnosis not present

## 2017-04-23 DIAGNOSIS — E039 Hypothyroidism, unspecified: Secondary | ICD-10-CM | POA: Diagnosis not present

## 2017-06-16 MED FILL — LEVOTHYROXINE 137 MCG TAB: 137 | 90 days supply | Qty: 90 | Fill #1

## 2017-06-16 MED FILL — HYDROCHLOROTHIAZIDE 25 MG T: 25 | 60 days supply | Qty: 60 | Fill #3

## 2017-06-16 MED FILL — AMLODIPINE BESYLATE 2.5 MG: 2.5 | 60 days supply | Qty: 60 | Fill #3

## 2017-06-18 DIAGNOSIS — J069 Acute upper respiratory infection, unspecified: Secondary | ICD-10-CM | POA: Diagnosis not present

## 2017-07-16 MED FILL — CLINDAMYCIN PH 1% GEL: 1 | 7 days supply | Qty: 30 | Fill #0

## 2017-07-22 MED FILL — LEVOTHYROXINE 125 MCG TAB: 125 | 90 days supply | Qty: 90 | Fill #0

## 2017-07-28 MED FILL — IPRATROPIUM 0.06% SPRAY: 0.06 | 30 days supply | Qty: 15 | Fill #0

## 2017-07-28 MED FILL — OSELTAMIVIR PHOSPHATE 75 MG: 75 | 5 days supply | Qty: 10 | Fill #0

## 2017-07-28 MED FILL — HYDROCODONE-HOMATROPINE SYR: 5-1.5 | 6 days supply | Qty: 120 | Fill #0

## 2017-08-18 ENCOUNTER — Other Ambulatory Visit: Payer: Self-pay | Admitting: Endocrinology

## 2017-08-18 DIAGNOSIS — E049 Nontoxic goiter, unspecified: Secondary | ICD-10-CM

## 2017-08-18 MED FILL — NEO/POLYMYXIN/DEXAMETH DROP: 3.5-10000-0 | 30 days supply | Qty: 5 | Fill #0

## 2017-08-21 MED FILL — AMLODIPINE BESYLATE 2.5 MG: 2.5 | 30 days supply | Qty: 30 | Fill #0

## 2017-08-21 MED FILL — HYDROCHLOROTHIAZIDE 25 MG T: 25 | 30 days supply | Qty: 30 | Fill #0

## 2017-09-16 MED FILL — VENTOLIN HFA 90 MCG INHALER: 108 (90 BAS | 16 days supply | Qty: 18 | Fill #0

## 2017-09-16 MED FILL — TRIAMTERENE/HCTZ 37.5/25 CP: 37.5-25 | 30 days supply | Qty: 30 | Fill #0

## 2017-09-16 MED FILL — QVAR REDIHALER 80 MCG/ACT A: 80 | 60 days supply | Qty: 11 | Fill #0

## 2017-10-08 MED FILL — CLINDAMYCIN PH 1% GEL: 1 | 7 days supply | Qty: 30 | Fill #1

## 2017-10-15 MED FILL — TRIAMTERENE/HCTZ 37.5/25 CP: 37.5-25 | 30 days supply | Qty: 30 | Fill #1

## 2017-10-22 MED FILL — metroNIDAZOLE 500 MG TABS: 500 | 7 days supply | Qty: 14 | Fill #0

## 2017-10-22 MED FILL — FLUCONAZOLE 150 MG TABS: 150 | 1 days supply | Qty: 1 | Fill #0

## 2017-10-27 MED FILL — LEVOTHYROXINE 125 MCG TAB: 125 | 90 days supply | Qty: 90 | Fill #1

## 2017-11-26 MED FILL — TRIAMTERENE/HCTZ 37.5/25 CP: 37.5-25 | 30 days supply | Qty: 30 | Fill #2

## 2017-12-14 ENCOUNTER — Ambulatory Visit
Admission: RE | Admit: 2017-12-14 | Discharge: 2017-12-14 | Disposition: A | Discharge status: Home / Self Care | Payer: Self-pay | Source: Ambulatory Visit | Attending: Endocrinology | Admitting: Endocrinology

## 2017-12-14 DIAGNOSIS — E049 Nontoxic goiter, unspecified: Secondary | ICD-10-CM

## 2017-12-17 MED FILL — EPINEPHRINE 0.3 MG AUTO-INJ: 0.3 | 30 days supply | Qty: 2 | Fill #0

## 2017-12-17 MED FILL — CLINDAMYCIN PH 1% GEL: 1 | 15 days supply | Qty: 30 | Fill #0

## 2017-12-23 MED FILL — TRIAMTERENE/HCTZ 37.5/25 CP: 37.5-25 | 90 days supply | Qty: 90 | Fill #0

## 2018-01-20 MED FILL — LEVOTHYROXINE 125 MCG TAB: 125 | 90 days supply | Qty: 90 | Fill #0

## 2018-04-02 ENCOUNTER — Ambulatory Visit (INDEPENDENT_AMBULATORY_CARE_PROVIDER_SITE_OTHER): Payer: Self-pay | Admitting: Obstetrics and Gynecology

## 2018-04-02 VITALS — BP 125/85 | HR 103 | Temp 99.4°F | Resp 20 | Wt 196.6 lb

## 2018-04-02 DIAGNOSIS — J0110 Acute frontal sinusitis, unspecified: Secondary | ICD-10-CM

## 2018-04-02 DIAGNOSIS — R0981 Nasal congestion: Secondary | ICD-10-CM

## 2018-04-02 DIAGNOSIS — R6889 Other general symptoms and signs: Secondary | ICD-10-CM

## 2018-04-02 LAB — POCT INFLUENZA A/B
Influenza A, POC: NEGATIVE
Influenza B, POC: NEGATIVE

## 2018-04-02 MED ORDER — AMOXICILLIN-POT CLAVULANATE 875-125 MG PO TABS: 1.0000 | ORAL_TABLET | Freq: Two times a day (BID) | ORAL | 0 refills | Status: AC

## 2018-04-02 NOTE — Progress Notes (Signed)
  Subjective:     Patient ID: Evelyn Baker, female   DOB: [birth year], 57 y.o.   MRN: [id number]  HPI   [name] is a 57 y.o. female with a history of asthma here with nasal congestion, chills, headache and cough X 2 days. No fever.  Does not feel short of breath. Has used her albuterol inhaler 1 X in the last 24 hours. No wheezing. History of sinusitis.    Review of Systems  Constitutional: Positive for chills. Negative for fever.  HENT: Positive for congestion, postnasal drip, rhinorrhea, sinus pressure and sore throat. Negative for trouble swallowing and voice change.   Respiratory: Negative for shortness of breath and wheezing.   Neurological: Positive for headaches.   Objective:   Physical Exam  Constitutional: She is oriented to person, place, and time. She appears well-developed and well-nourished.  Non-toxic appearance. She appears ill. No distress.  HENT:  Head: Normocephalic.  Mouth/Throat: Oropharynx is clear and moist.  Eyes: Pupils are equal, round, and reactive to light. Conjunctivae and EOM are normal.  Cardiovascular: Normal rate.  Pulmonary/Chest: Effort normal and breath sounds normal. No stridor. No respiratory distress. She has no wheezes. She has no rales. She exhibits no tenderness.  Musculoskeletal: Normal range of motion.  Neurological: She is alert and oriented to person, place, and time.  Skin: Skin is warm and dry.   Assessment:      1. Flu-like symptoms - POCT Influenza A/B  2. Head congestion  3. Acute non-recurrent frontal sinusitis  Other orders - omeprazole (PRILOSEC) 40 MG capsule; Take 40 mg by mouth daily. - cetirizine (ZYRTEC) 10 MG chewable tablet; Chew 10 mg by mouth daily. - meclizine (ANTIVERT) 25 MG tablet; meclizine 25 mg tablet - beclomethasone (QVAR) 40 MCG/ACT inhaler; Inhale into the lungs 2 (two) times daily. - amoxicillin-clavulanate (AUGMENTIN) 875-125 MG tablet; Take 1 tablet by mouth 2 (two) times daily for 7  days.   Plan:   PCR flu negative  DC home in stable condition Rx: Augmentin Return in 5-7 days if no improvement or see PCP OTC benadryl at night and OTC zyrtec during the day as directed on the bottle Increase rest and fluid  Nyeem Stoke, Artist Pais, NP 04/02/2018 7:42 PM

## 2018-04-09 MED FILL — CLINDAMYCIN PH 1% GEL: 1 | 30 days supply | Qty: 60 | Fill #1

## 2018-05-14 MED FILL — TRIAMTERENE/HCTZ 37.5/25 CP: 37.5-25 | 90 days supply | Qty: 90 | Fill #1

## 2018-05-14 MED FILL — LEVOTHYROXINE 125 MCG TAB: 125 | 90 days supply | Qty: 90 | Fill #1

## 2018-05-24 MED FILL — HYDROCODON-APAP 7.5-325: 7.5-325 | 2 days supply | Qty: 8 | Fill #0

## 2018-05-24 MED FILL — AZITHROMYCIN 250 MG TABLET: 250 | 5 days supply | Qty: 6 | Fill #0

## 2018-05-24 MED FILL — LORazepam 1 MG TABS: 1 | 1 days supply | Qty: 2 | Fill #0

## 2018-06-03 MED FILL — HYDROCORTISONE ACE-PRAMOXIN: 2.5-1 | 14 days supply | Qty: 30 | Fill #0

## 2018-09-15 MED FILL — TRIAMTERENE/HCTZ 37.5/25 CP: 37.5-25 | 30 days supply | Qty: 30 | Fill #0

## 2018-09-15 MED FILL — LEVOTHYROXINE 125 MCG TAB: 125 | 90 days supply | Qty: 90 | Fill #2

## 2018-09-16 MED FILL — HYDROCORTISONE ACE-PRAMOXIN: 2.5-1 | 14 days supply | Qty: 30 | Fill #1

## 2018-09-16 MED FILL — CLINDAMYCIN PH 1% GEL: 1 | 30 days supply | Qty: 60 | Fill #2 | Status: TO

## 2018-11-12 MED FILL — CLINDAMYCIN PH 1% GEL: 1 | 15 days supply | Qty: 30 | Fill #0

## 2018-12-14 MED FILL — TRIAMTERENE/HCTZ 37.5/25 CP: 37.5-25 | 30 days supply | Qty: 30 | Fill #0

## 2018-12-14 MED FILL — LEVOTHYROXINE 125 MCG TAB: 125 | 90 days supply | Qty: 90 | Fill #3

## 2018-12-29 MED FILL — OMEPRAZOLE DR 40 MG CAPSULE: 40 | 90 days supply | Qty: 90 | Fill #0

## 2019-01-25 MED FILL — TRIAMTERENE/HCTZ 37.5/25 CP: 37.5-25 | 90 days supply | Qty: 90 | Fill #0

## 2019-01-26 ENCOUNTER — Other Ambulatory Visit: Payer: Self-pay

## 2019-01-26 ENCOUNTER — Encounter: Payer: Self-pay | Admitting: Dietician

## 2019-01-26 ENCOUNTER — Encounter: Payer: No Typology Code available for payment source | Attending: Family Medicine | Admitting: Dietician

## 2019-01-26 DIAGNOSIS — E785 Hyperlipidemia, unspecified: Secondary | ICD-10-CM | POA: Diagnosis not present

## 2019-01-26 NOTE — Progress Notes (Signed)
Medical Nutrition Therapy  Appt Start Time: 7:30am  End Time: 8:30am  Primary concerns today: grocery shopping, meal planning, weight maintenance, high cholesterol    Referral diagnosis: E78.5 Hyperlipidemia, unspecified  Preferred learning style: no preference indicated Learning readiness: ready   NUTRITION ASSESSMENT   Anthropometrics  Weight: 210 lbs   Biochemical Data & Labs Cholesterol: 198  Chol/HDL Ratio: 4.1 (H) HDL: 48  Triglycerides: 137  LDL: 123 (H)  Non-HDL: 150 (H)   Clinical Medical Hx: asthma, HTN, hyperlipidemia, hypothyroidism, GERD Medications: Qvar, Zyrtec, Cipro, Synthroid, Antivert, Lariam, Prilosec, aspirin  Allergies: shellfish, pineapple, avocado   Psychosocial/Lifestyle Pt is personable and talkative, very pleasant. Lives with her husband, mentioned she has daughters. Currently working from home. Was engaged during appointment and seems very interested in nutrition.   24-Hr Dietary Recall First Meal: quaker oat squares (or honey bunches of oats, or special K) + 2% lactose free milk + decaf coffee w/ creamer & Splenda  Snack: none stated Second Meal: baked fish + vegetables Snack: none stated  Third Meal: fried chicken  Snack: none stated  Beverages: decaf coffee, Crystal Light lemonade, water   Food & Nutrition Related Hx Dietary Hx: For meats, typically eats baked and fried fish and chicken, and will sometimes eat bacon and sausage for breakfast on Sundays. Uses Mrs. Dash and does not add salt. Likes zucchini bread and has been having this lately in the mornings with coffee. Likes fruit including nectarines, apricots, cherries, strawberries, berries, melons (no apples, oranges, and bananas, because she was told these are "bad"), and likes all vegetables. Doesn't care much for sweets (adding sugar to foods, or candy.) Been avoiding bread since June when found out about high cholesterol. Also, made other dietary changes and has been paying attention to  food intake since June. Does not drink sodas, drinks lots of water. Likes her decaf coffee, and now uses small amount of creamer with Splenda, rather than lots of [place] sweet cream like before.    Estimated Daily Fluid Intake: 64 oz Supplements: "Smarty Pants" Women's Formula, [employer] Made "CholestOff Plus" GI / Other Notable Symptoms: constipation, sometimes nausea  Sleep: does not sleep well  Stress / Self-Care: enjoys walking alone daily   Physical Activity  Current average weekly physical activity: 30-60 mins of walking daily   Estimated Energy Needs Calories: 1800-2000 Carbohydrate: 200-225g Protein: 113-125g Fat: 60-67g   NUTRITION DIAGNOSIS  Intake of types of fats inconsistent with needs (NI-5.5.3) related to food and nutrition related knowledge deficit as evidenced by pt reported food hx of saturated fat intake that exceeds estimated needs and elevated serum cholesterol levels.    NUTRITION INTERVENTION  Nutrition education (E-1) on the following topics:  . Balanced, healthful eating  . Hyperlipidemia nutrition therapy   Handouts Provided Include   MyPlate Plan   Dyslipidemia Nutrition Therapy   Learning Style & Readiness for Change Teaching method utilized: Visual & Auditory  Demonstrated degree of understanding via: Teach Back  Barriers to learning/adherence to lifestyle change: None Identified   Goals Established by Pt . Follow the MyPlate Plan while tracking food and beverage intake.    MONITORING & EVALUATION Dietary intake, weekly physical activity, and goals in 4 to 6 weeks.  RD's Notes for Next Visit  . Meal Ideas/ 1-Day Sample Meal Plan  . Types of Fat   Next Steps  Patient is to return to [facility] for follow up visit in 4 to 6 weeks.

## 2019-01-26 NOTE — Patient Instructions (Signed)
   Aim to follow your MyPlate Plan, and we will take a look at your progress when you come back at your follow up.

## 2019-02-24 MED FILL — CLINDAMYCIN PH 1% GEL: 1 | 15 days supply | Qty: 30 | Fill #0

## 2019-02-24 MED FILL — LEVOTHYROXINE 125 MCG TAB: 125 | 90 days supply | Qty: 90 | Fill #0

## 2019-03-02 ENCOUNTER — Ambulatory Visit: Payer: No Typology Code available for payment source | Admitting: Dietician

## 2019-03-31 ENCOUNTER — Other Ambulatory Visit: Payer: Self-pay

## 2019-03-31 ENCOUNTER — Encounter: Payer: No Typology Code available for payment source | Attending: Family Medicine | Admitting: Dietician

## 2019-03-31 ENCOUNTER — Encounter: Payer: Self-pay | Admitting: Dietician

## 2019-03-31 DIAGNOSIS — E785 Hyperlipidemia, unspecified: Secondary | ICD-10-CM | POA: Diagnosis not present

## 2019-03-31 NOTE — Progress Notes (Signed)
199 lbs  Medical Nutrition Therapy  Follow-Up Visit Appt Start Time: 7:30am   End Time: 8:15am  Patient was originally seen on 01/26/2019 for hyperlipidemia and weight maintenance nutrition therapy.  Primary concerns today: continued guidance with food choices and maintaining physical activity   NUTRITION ASSESSMENT   Anthropometrics  Weight: 199 lbs (-11 lbs since previous visit 2 months ago)   Clinical Medical Hx: asthma, HTN, hyperlipidemia, hypothyroidism, GERD  Lifestyle & Dietary Hx Pt is personable and talkative, very pleasant. Lives with her husband, mentioned she has daughters. Still working from home d/t the pandemic. Is always engaged during appointments and seems very interested in nutrition.   Since previous nutrition visit, pt states she kept up with her goal of tracking her food intake and building balanced meals for about 6 weeks. Then, pt states she noticed she began stress eating more d/t certain family situations. States she would like to channel her stress towards walking more and being active rather than eating. Loves walking, and continues to go for walks outdoors almost daily and enjoys going to parks to walk with her daughters. Is worried about keeping up with walking during the winter months. Picked up sewing as a new hobby and has been making masks and other things as another way to keep busy so she doesn't eat out of boredom or stress.   Pt states she has been struggling to figure out good healthy lunch meals since she is at home working everyday and does not always have foods she needs on hand. States her husband eats differently ("eats what he wants") however she would like to make better food choices but has a hard time keeping fresh produce around before it goes bad since only she is eating it. States that her husband will go out and pick up lunch for her during the week, however it is usually fast food (fried chicken sandwich) so she constantly has to request he  order her a salad or something else.   States she likes fruit and that it helps her digestive system. Does not care for sweets or candy. Still avoids red meat (unless it is lean) and has cut back bread intake.  States she loves cheese and eats this almost daily.   24-Hr Dietary Recall First Meal: oatmeal + egg (or cereal + 2% milk + fruit)  Snack: grapes + cheese  Second Meal: salad (or fried chicken sandwich)  Snack: fruit Third Meal: lean meat + vegetables + potato Snack: fruit  Beverages: water   Estimated Energy Needs Calories: 1800-2000 Carbohydrate: 200-225g Protein: 113-125g Fat: 60-67g   NUTRITION DIAGNOSIS  Intake of types of fats inconsistent with needs (NI-5.5.3) related to food and nutrition related knowledge deficit as evidenced by pt reported food hx of saturated fat intake that exceeds estimated needs and elevated serum cholesterol levels.  (from 01/26/2019)   NUTRITION INTERVENTION  Nutrition education (E-1) on the following topics:   Balanced, healthful eating and how to build meals and snacks   Handouts Provided Include   Meal Ideas   Balanced Snacks   Learning Style & Readiness for Change Teaching method utilized: Visual & Auditory  Demonstrated degree of understanding via: Teach Back  Barriers to learning/adherence to lifestyle change: None Identified   Progress/ Updates on Pt's Goals . Continue building balanced meals and snacks, especially focusing on lunches. Incorporate protein and carbohydrates.    MONITORING & EVALUATION Dietary intake, weekly physical activity, and goals in 2 months.  RD's Notes for Next  Visit . Types of Fat  Next Steps  Patient is to return to [facility] for follow up visit in 2 months. Patient may contact via phone or email with questions or concerns in the meantime.

## 2019-05-31 ENCOUNTER — Ambulatory Visit: Payer: No Typology Code available for payment source | Admitting: Dietician

## 2019-06-20 MED FILL — LEVOTHYROXINE 125 MCG TAB: 125 | 90 days supply | Qty: 90 | Fill #1

## 2019-06-20 MED FILL — TRIAMTERENE/HCTZ 37.5/25 CP: 37.5-25 | 90 days supply | Qty: 90 | Fill #1

## 2019-06-22 MED FILL — ALBUTEROL SULFATE HFA 108 (: 108 (90 BAS | 17 days supply | Qty: 9 | Fill #0

## 2019-07-21 MED FILL — LEVOTHYROXINE 137 MCG TAB: 137 | 90 days supply | Qty: 90 | Fill #0

## 2019-07-27 ENCOUNTER — Other Ambulatory Visit: Payer: Self-pay | Admitting: Endocrinology

## 2019-07-27 DIAGNOSIS — E049 Nontoxic goiter, unspecified: Secondary | ICD-10-CM

## 2019-08-25 ENCOUNTER — Other Ambulatory Visit: Payer: Self-pay | Admitting: Family Medicine

## 2019-08-25 DIAGNOSIS — Z1231 Encounter for screening mammogram for malignant neoplasm of breast: Secondary | ICD-10-CM

## 2019-09-05 ENCOUNTER — Ambulatory Visit
Admission: RE | Admit: 2019-09-05 | Discharge: 2019-09-05 | Disposition: A | Discharge status: Home / Self Care | Payer: No Typology Code available for payment source | Source: Ambulatory Visit | Attending: Endocrinology | Admitting: Endocrinology

## 2019-09-05 DIAGNOSIS — E049 Nontoxic goiter, unspecified: Secondary | ICD-10-CM

## 2019-09-26 ENCOUNTER — Ambulatory Visit
Admission: RE | Admit: 2019-09-26 | Discharge: 2019-09-26 | Disposition: A | Discharge status: Home / Self Care | Payer: No Typology Code available for payment source | Source: Ambulatory Visit | Attending: Family Medicine | Admitting: Family Medicine

## 2019-09-26 ENCOUNTER — Other Ambulatory Visit: Payer: Self-pay

## 2019-09-26 DIAGNOSIS — Z1231 Encounter for screening mammogram for malignant neoplasm of breast: Secondary | ICD-10-CM

## 2019-10-11 MED FILL — TRIAMTERENE/HCTZ 37.5/25 CP: 37.5-25 | 90 days supply | Qty: 90 | Fill #0

## 2019-10-19 MED FILL — LEVOTHYROXINE SODIUM 125 MC: 125 | 30 days supply | Qty: 30 | Fill #0

## 2019-11-22 MED FILL — LEVOTHYROXINE SODIUM 125 MC: 125 | 90 days supply | Qty: 90 | Fill #1

## 2020-01-24 MED FILL — LEVOTHYROXINE SODIUM 112 MC: 112 | 30 days supply | Qty: 30 | Fill #0

## 2020-01-31 ENCOUNTER — Other Ambulatory Visit: Payer: Self-pay

## 2020-01-31 DIAGNOSIS — I83893 Varicose veins of bilateral lower extremities with other complications: Secondary | ICD-10-CM

## 2020-02-06 MED FILL — TRIAMTERENE/HCTZ 37.5/25 CP: 37.5-25 | 90 days supply | Qty: 90 | Fill #1

## 2020-02-13 ENCOUNTER — Ambulatory Visit (HOSPITAL_COMMUNITY)
Admission: RE | Admit: 2020-02-13 | Discharge: 2020-02-13 | Disposition: A | Discharge status: Home / Self Care | Payer: No Typology Code available for payment source | Source: Ambulatory Visit | Attending: Surgery | Admitting: Surgery

## 2020-02-13 ENCOUNTER — Other Ambulatory Visit: Payer: Self-pay

## 2020-02-13 ENCOUNTER — Ambulatory Visit (INDEPENDENT_AMBULATORY_CARE_PROVIDER_SITE_OTHER): Payer: No Typology Code available for payment source | Admitting: Physician Assistant

## 2020-02-13 ENCOUNTER — Encounter: Payer: Self-pay | Admitting: Physician Assistant

## 2020-02-13 DIAGNOSIS — I8393 Asymptomatic varicose veins of bilateral lower extremities: Secondary | ICD-10-CM

## 2020-02-13 DIAGNOSIS — R600 Localized edema: Secondary | ICD-10-CM | POA: Insufficient documentation

## 2020-02-13 DIAGNOSIS — I872 Venous insufficiency (chronic) (peripheral): Secondary | ICD-10-CM | POA: Insufficient documentation

## 2020-02-13 DIAGNOSIS — I83893 Varicose veins of bilateral lower extremities with other complications: Secondary | ICD-10-CM | POA: Insufficient documentation

## 2020-02-13 NOTE — Progress Notes (Signed)
Requested by:  Leighton Ruff, [place],  [place] [id number]  Reason for consultation: bilateral lower extremity varicose veins with pain and swelling   History of Present Illness   Evelyn Baker is a 59 y.o. (March 15, 1961) female who presents for evaluation of bilateral lower extremity varicose veins with pain and swelling  Venous symptoms include: aching, heavy, tired, swelling mostly of LLE compared to RLE Onset/duration: noticed more swelling in the past 18 months since working from home and not going into her work office Aggravating factors: sitting for prolonged periods of time Alleviating factors: legs feel better and edema resolves overnight Compression: in distant past but not since symptom onset Pain medications:  none Previous vein procedures:  none History of DVT:  Negative  Past Medical History:  Diagnosis Date  . Asthma   . Hyperlipidemia   . Hypertension   . Thyroid disease     No past surgical history on file.  Social History   Socioeconomic History  . Marital status: Married    Spouse name: Not on file  . Number of children: Not on file  . Years of education: Not on file  . Highest education level: Not on file  Occupational History  . Not on file  Tobacco Use  . Smoking status: Never Smoker  Substance and Sexual Activity  . Alcohol use: No  . Drug use: No  . Sexual activity: Yes    Birth control/protection: Surgical    Comment: partial hysterectomy  Other Topics Concern  . Not on file  Social History Narrative  . Not on file   Social Determinants of Health   Financial Resource Strain:   . Difficulty of Paying Living Expenses:   Food Insecurity:   . Worried About Charity fundraiser in the Last Year:   . Arboriculturist in the Last Year:   Transportation Needs:   . Film/video editor (Medical):   Marland Kitchen Lack of Transportation (Non-Medical):   Physical Activity:   . Days of Exercise per Week:   . Minutes of  Exercise per Session:   Stress:   . Feeling of Stress :   Social Connections:   . Frequency of Communication with Friends and Family:   . Frequency of Social Gatherings with Friends and Family:   . Attends Religious Services:   . Active Member of Clubs or Organizations:   . Attends Archivist Meetings:   Marland Kitchen Marital Status:   Intimate Partner Violence:   . Fear of Current or Ex-Partner:   . Emotionally Abused:   Marland Kitchen Physically Abused:   . Sexually Abused:    No family history on file.  Current Outpatient Medications  Medication Sig Dispense Refill  . beclomethasone (QVAR) 40 MCG/ACT inhaler Inhale into the lungs 2 (two) times daily.    . cetirizine (ZYRTEC) 10 MG chewable tablet Chew 10 mg by mouth daily.    . ciprofloxacin (CIPRO) 500 MG tablet Take 500 mg by mouth 2 (two) times daily. BID for 3 days    . levothyroxine (SYNTHROID, LEVOTHROID) 150 MCG tablet Take 150 mcg by mouth daily.    . meclizine (ANTIVERT) 25 MG tablet meclizine 25 mg tablet    . mefloquine (LARIAM) 250 MG tablet Take 250 mg by mouth every 7 (seven) days.    Marland Kitchen omeprazole (PRILOSEC) 40 MG capsule Take 40 mg by mouth daily.     No current facility-administered medications for this visit.  Allergies  Allergen Reactions  . Codeine   . Latex   . Shellfish Allergy     REVIEW OF SYSTEMS (negative unless checked):   Cardiac:  []  Chest pain or chest pressure? []  Shortness of breath upon activity? []  Shortness of breath when lying flat? []  Irregular heart rhythm?  Vascular:  []  Pain in calf, thigh, or hip brought on by walking? []  Pain in feet at night that wakes you up from your sleep? []  Blood clot in your veins? []  Leg swelling?  Pulmonary:  []  Oxygen at home? []  Productive cough? []  Wheezing?  Neurologic:  []  Sudden weakness in arms or legs? []  Sudden numbness in arms or legs? []  Sudden onset of difficult speaking or slurred speech? []  Temporary loss of vision in one eye? []   Problems with dizziness?  Gastrointestinal:  []  Blood in stool? []  Vomited blood?  Genitourinary:  []  Burning when urinating? []  Blood in urine?  Psychiatric:  []  Major depression  Hematologic:  []  Bleeding problems? []  Problems with blood clotting?  Dermatologic:  []  Rashes or ulcers?  Constitutional:  []  Fever or chills?  Ear/Nose/Throat:  []  Change in hearing? []  Nose bleeds? []  Sore throat?  Musculoskeletal:  []  Back pain? []  Joint pain? []  Muscle pain?   Physical Examination    There were no vitals filed for this visit. There is no height or weight on file to calculate BMI.  General:  WDWN in NAD; vital signs documented above Gait: Not observed HENT: WNL, normocephalic Pulmonary: normal non-labored breathing Cardiac: regular HR Abdomen: soft, NT, no masses Skin: without rashes Extremities: symmetrical DP pulses; prominent surface veins of medial thighs and medial knee; no visible varicosities; pigmentation changes medial lower left leg compared to right Musculoskeletal: no muscle wasting or atrophy  Neurologic: A&O X 3;  No focal weakness or paresthesias are detected Psychiatric:  The pt has Normal affect.  Non-invasive Vascular Imaging   BLE Venous Insufficiency Duplex :   RLE:   Negative for DVT and SVT,   Proximal thigh and proximal calf GSV reflux,  GSV diameter around 24mm  Negative for SSV reflux,  Negative for deep venous reflux   LLE:  Negative for DVT and SVT,   GSV reflux at [facility], proximal calf and anterior accessory,   GSV diameter 4-93mm  Negative for SSV reflux,  deep venous reflux in CFV   Medical Decision Making   Evelyn Baker is a 59 y.o. female who presents with chronic venous insufficiency of LLE with venous symptoms including edema   Venous reflux duplex was negative for DVT of BLE  Venous insufficiency was noted in L GSV at the saphenofemoral junction, in the anterior accessory saphenous vein and in the  proximal calf  I discussed with the patient the use of her 20-30 mm thigh high compression stockings and need for 3 month trial of such;  I also discussed elevation of LLE when possible during the day  She can also use NSAIDs for relief of her venous symptoms  The patient will follow up in 3 months with Dr Oneida Alar or Dr. Scot Dock for further evaluation of venous insufficiency   Dagoberto Ligas, PA-C [facility] [place] Office: [phone]  02/13/2020, 3:20 PM  Clinic MD: Dr. Trula Slade

## 2020-02-21 ENCOUNTER — Other Ambulatory Visit (HOSPITAL_COMMUNITY): Payer: Self-pay | Admitting: Endocrinology

## 2020-02-21 MED FILL — LEVOTHYROXINE 100 MCG TABLE: 100 | 30 days supply | Qty: 30 | Fill #0

## 2020-03-06 MED FILL — OMEPRAZOLE 40 MG CPDR: 40 | 90 days supply | Qty: 90 | Fill #0

## 2020-03-27 MED FILL — LEVOTHYROXINE 100 MCG TABLE: 100 | 30 days supply | Qty: 30 | Fill #1

## 2020-05-07 MED FILL — LEVOTHYROXINE 100 MCG TABLE: 100 | 30 days supply | Qty: 30 | Fill #2

## 2020-05-17 ENCOUNTER — Ambulatory Visit: Payer: No Typology Code available for payment source | Admitting: Vascular Surgery

## 2020-06-07 MED FILL — LEVOTHYROXINE 100 MCG TABLE: 100 | 30 days supply | Qty: 30 | Fill #3

## 2020-06-08 ENCOUNTER — Other Ambulatory Visit (HOSPITAL_COMMUNITY): Payer: Self-pay | Admitting: Family Medicine

## 2020-06-08 MED FILL — TRIAMTERENE/HCTZ 37.5/25 CP: 37.5-25 | 90 days supply | Qty: 90 | Fill #0

## 2020-06-21 ENCOUNTER — Ambulatory Visit: Payer: No Typology Code available for payment source | Admitting: Vascular Surgery

## 2020-07-10 ENCOUNTER — Other Ambulatory Visit (HOSPITAL_COMMUNITY): Payer: Self-pay | Admitting: Family Medicine

## 2020-07-10 MED FILL — TRIAMTERENE-HCTZ 75-50 MG T: 75-50 | 30 days supply | Qty: 30 | Fill #0

## 2020-07-10 MED FILL — LEVOTHYROXINE 100 MCG TABLE: 100 | 90 days supply | Qty: 90 | Fill #4

## 2020-08-09 ENCOUNTER — Other Ambulatory Visit (HOSPITAL_COMMUNITY): Payer: Self-pay | Admitting: Family Medicine

## 2020-08-09 MED FILL — TRIAMTERENE-HCTZ 75-50 MG T: 75-50 | 30 days supply | Qty: 30 | Fill #0

## 2020-08-09 MED FILL — ALBUTEROL SULFATE HFA 108 (: 108 (90 BAS | 17 days supply | Qty: 9 | Fill #0

## 2020-09-13 ENCOUNTER — Ambulatory Visit: Payer: No Typology Code available for payment source | Admitting: Vascular Surgery

## 2020-09-14 ENCOUNTER — Other Ambulatory Visit (HOSPITAL_COMMUNITY): Payer: Self-pay | Admitting: Family Medicine

## 2020-10-11 ENCOUNTER — Other Ambulatory Visit (HOSPITAL_COMMUNITY): Payer: Self-pay

## 2020-10-11 MED ORDER — LEVOTHYROXINE SODIUM 100 MCG PO TABS
100.0000 ug | ORAL_TABLET | Freq: Every morning | ORAL | 0 refills | Status: DC
  Filled 2020-10-11: qty 90, 90d supply, fill #0

## 2020-10-11 MED FILL — Triamterene & Hydrochlorothiazide Tab 75-50 MG: ORAL | 30 days supply | Qty: 30 | Fill #0 | Status: AC

## 2020-10-30 ENCOUNTER — Other Ambulatory Visit: Payer: Self-pay

## 2020-12-11 ENCOUNTER — Other Ambulatory Visit (HOSPITAL_COMMUNITY): Payer: Self-pay

## 2020-12-11 MED FILL — Triamterene & Hydrochlorothiazide Tab 75-50 MG: ORAL | 30 days supply | Qty: 30 | Fill #1 | Status: AC

## 2020-12-12 ENCOUNTER — Other Ambulatory Visit (HOSPITAL_COMMUNITY): Payer: Self-pay | Admitting: Family Medicine

## 2020-12-12 ENCOUNTER — Other Ambulatory Visit: Payer: Self-pay

## 2020-12-12 ENCOUNTER — Ambulatory Visit (HOSPITAL_COMMUNITY)
Admission: RE | Admit: 2020-12-12 | Discharge: 2020-12-12 | Disposition: A | Discharge status: Home / Self Care | Payer: No Typology Code available for payment source | Source: Ambulatory Visit | Attending: Home Modifications | Admitting: Home Modifications

## 2020-12-12 DIAGNOSIS — M79605 Pain in left leg: Secondary | ICD-10-CM | POA: Insufficient documentation

## 2020-12-12 DIAGNOSIS — M7989 Other specified soft tissue disorders: Secondary | ICD-10-CM | POA: Diagnosis present

## 2020-12-12 NOTE — Progress Notes (Signed)
Left lower extremity venous duplex completed. Refer to "CV Proc" under chart review to view preliminary results.  12/12/2020 9:37 AM Kelby Aline., MHA, RVT, RDCS, RDMS

## 2020-12-27 ENCOUNTER — Other Ambulatory Visit (HOSPITAL_COMMUNITY): Payer: Self-pay

## 2020-12-27 MED ORDER — CEPHALEXIN 500 MG PO CAPS
500.0000 mg | ORAL_CAPSULE | Freq: Four times a day (QID) | ORAL | 0 refills | Status: AC
  Filled 2020-12-27: qty 20, 5d supply, fill #0

## 2020-12-27 MED ORDER — DICLOFENAC POTASSIUM 50 MG PO TABS
50.0000 mg | ORAL_TABLET | Freq: Two times a day (BID) | ORAL | 1 refills | Status: DC | PRN
  Filled 2020-12-27: qty 60, 30d supply, fill #0

## 2021-01-02 ENCOUNTER — Ambulatory Visit (INDEPENDENT_AMBULATORY_CARE_PROVIDER_SITE_OTHER): Payer: No Typology Code available for payment source | Admitting: Vascular Surgery

## 2021-01-02 ENCOUNTER — Other Ambulatory Visit: Payer: Self-pay

## 2021-01-02 ENCOUNTER — Encounter: Payer: Self-pay | Admitting: Vascular Surgery

## 2021-01-02 VITALS — BP 124/86 | HR 81 | Resp 16 | Ht 65.0 in | Wt 202.0 lb

## 2021-01-02 DIAGNOSIS — I83812 Varicose veins of left lower extremities with pain: Secondary | ICD-10-CM

## 2021-01-02 NOTE — Progress Notes (Signed)
Patient is a 60 year old female who returns for follow-up today.  She was last seen August 2021 in our APP clinic for bilateral lower extremity varicose veins with pain and swelling.  She currently develops heaviness aching fullness tired feeling left worse than right lower extremity.  This has been going on for about 2 years.  Her symptoms improve overnight and her legs are fairly fresh in the morning.  She was given a prescription for compression stockings in August of this past year.  However, over the last 4 months that she has also developed an additional symptom of a burning sensation deep within her left calf.  The foot is not involved.  It is not numbness or tingling.  She is currently being treated with Keflex and nonsteroidals for possible superficial thrombophlebitis.  She is also had some darkening of the skin and redness that has now occurred over the medial aspect of her left calf.  She states the pain deep inside the calf never leaves.  The swelling and aching does improve with leg elevation and compression stockings.  , Past Medical History:  Diagnosis Date   Asthma    Hyperlipidemia    Hypertension    Thyroid disease     Past Surgical History:  Procedure Laterality Date   FOOT SURGERY     TOTAL ABDOMINAL HYSTERECTOMY      Current Outpatient Medications on File Prior to Visit  Medication Sig Dispense Refill   albuterol (VENTOLIN HFA) 108 (90 Base) MCG/ACT inhaler INHALE 2 PUFFS BY MOUTH EVERY 4 HOURS AS NEEDED 8.5 g 2   beclomethasone (QVAR) 40 MCG/ACT inhaler Inhale into the lungs 2 (two) times daily.     benzonatate (TESSALON) 100 MG capsule TAKE 1 CAPSULE BY MOUTH THREE TIMES DAILY AS NEEDED FOR COUGH 30 capsule 0   diclofenac (CATAFLAM) 50 MG tablet Take 1 tablet (50 mg total) by mouth 2 (two) times daily as needed with food or milk 60 tablet 1   EPINEPHRINE BITARTRATE IN Inhale into the lungs.     levothyroxine (SYNTHROID) 100 MCG tablet TAKE 1 TABLET BY MOUTH IN THE  MORNING ON AN EMPTY STOMACH ONCE DAILY 30 tablet 6   levothyroxine (SYNTHROID) 100 MCG tablet Take 1 tablet by mouth in the morning on an empty stomach daily 90 tablet 0   levothyroxine (SYNTHROID, LEVOTHROID) 150 MCG tablet Take 150 mcg by mouth daily.     meclizine (ANTIVERT) 25 MG tablet meclizine 25 mg tablet     triamterene-hydrochlorothiazide (DYAZIDE) 37.5-25 MG capsule TAKE 1 CAPSULE BY MOUTH DAILY IN THE MORNING 90 capsule 1   triamterene-hydrochlorothiazide (MAXZIDE) 75-50 MG tablet TAKE 1 TABLET BY MOUTH EVERY MORNING 30 tablet 4   triamterene-hydrochlorothiazide (MAXZIDE) 75-50 MG tablet TAKE 1 TABLET BY MOUTH EVERY MORNING 30 tablet 0   No current facility-administered medications on file prior to visit.   Allergies  Allergen Reactions   Codeine    Latex    Shellfish Allergy     Physical exam:   Vitals:   01/02/21 1039  BP: 124/86  Pulse: 81  Resp: 16  SpO2: 98%  Weight: 202 lb (91.6 kg)  Height: 5\' 5"  (1.651 m)    Extremities: 2+ dorsalis pedis pulses Skin: Erythema left pretibial region medial calf  To: I reviewed the patient's venous reflux exam dated February 13, 2020.  This showed reflux in the right greater saphenous vein in the proximal thigh but not at the saphenofemoral junction.  The vein was about 4-1/2 mm  in diameter.  There was also some reflux at the proximal calf.  There is no reflux in the lesser saphenous vein.  On the left side there was reflux at the saphenofemoral junction with a vein diameter of about 4-1/2 to 5 mm throughout the thigh.  Is also an anterior accessory saphenous about 3-1/2 mm in diameter.  He had a DVT ultrasound on December 12, 2020 due to left leg pain.  This showed no evidence of DVT.  I did a SonoSite exam of the patient's left greater saphenous vein today which shows it is dilated around similar in diameter as described in the ultrasound previously.  It is very superficial however less than 2 mm from the skin surface and less than  1 mm from the skin surface in certain locations.  It does become deeper in the mid thigh.  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  She also now is developing some skin changes.  I had a lengthy discussion with her regarding the pain deep within her calf.  I am not sure exactly what the etiology of this is.  I told her with a vein procedure we could certainly help with the skin changes and a heaviness fullness and aching.  Hopefully the pain in her calf will resolve as well but this may be of another etiology and she understands this.  This is not completely resolved with compression stockings.  Plan: On SonoSite exam of the leg today the vein is very superficial almost underneath the skin surface.  In light of this I do not believe she is a very good candidate for laser ablation as she could end up with a significant burn in the skin.  I think a better option would be traditional ligation and stripping of her left greater saphenous vein in the operating room.  I gave her this option today as well as describing risk benefits and possible complications including but not limited to bleeding infection postoperative pain and risk of DVT 1/100,000.  She wishes to think about this and do little better research before she commits to that.  She will call us next week regarding her decision.   Ruta Hinds, MD [facility] [place] Office: [phone]

## 2021-01-02 NOTE — H&P (View-Only) (Signed)
Patient is a 60 year old female who returns for follow-up today.  She was last seen August 2021 in our APP clinic for bilateral lower extremity varicose veins with pain and swelling.  She currently develops heaviness aching fullness tired feeling left worse than right lower extremity.  This has been going on for about 2 years.  Her symptoms improve overnight and her legs are fairly fresh in the morning.  She was given a prescription for compression stockings in August of this past year.  However, over the last 4 months that she has also developed an additional symptom of a burning sensation deep within her left calf.  The foot is not involved.  It is not numbness or tingling.  She is currently being treated with Keflex and nonsteroidals for possible superficial thrombophlebitis.  She is also had some darkening of the skin and redness that has now occurred over the medial aspect of her left calf.  She states the pain deep inside the calf never leaves.  The swelling and aching does improve with leg elevation and compression stockings.  , Past Medical History:  Diagnosis Date   Asthma    Hyperlipidemia    Hypertension    Thyroid disease     Past Surgical History:  Procedure Laterality Date   FOOT SURGERY     TOTAL ABDOMINAL HYSTERECTOMY      Current Outpatient Medications on File Prior to Visit  Medication Sig Dispense Refill   albuterol (VENTOLIN HFA) 108 (90 Base) MCG/ACT inhaler INHALE 2 PUFFS BY MOUTH EVERY 4 HOURS AS NEEDED 8.5 g 2   beclomethasone (QVAR) 40 MCG/ACT inhaler Inhale into the lungs 2 (two) times daily.     benzonatate (TESSALON) 100 MG capsule TAKE 1 CAPSULE BY MOUTH THREE TIMES DAILY AS NEEDED FOR COUGH 30 capsule 0   diclofenac (CATAFLAM) 50 MG tablet Take 1 tablet (50 mg total) by mouth 2 (two) times daily as needed with food or milk 60 tablet 1   EPINEPHRINE BITARTRATE IN Inhale into the lungs.     levothyroxine (SYNTHROID) 100 MCG tablet TAKE 1 TABLET BY MOUTH IN THE  MORNING ON AN EMPTY STOMACH ONCE DAILY 30 tablet 6   levothyroxine (SYNTHROID) 100 MCG tablet Take 1 tablet by mouth in the morning on an empty stomach daily 90 tablet 0   levothyroxine (SYNTHROID, LEVOTHROID) 150 MCG tablet Take 150 mcg by mouth daily.     meclizine (ANTIVERT) 25 MG tablet meclizine 25 mg tablet     triamterene-hydrochlorothiazide (DYAZIDE) 37.5-25 MG capsule TAKE 1 CAPSULE BY MOUTH DAILY IN THE MORNING 90 capsule 1   triamterene-hydrochlorothiazide (MAXZIDE) 75-50 MG tablet TAKE 1 TABLET BY MOUTH EVERY MORNING 30 tablet 4   triamterene-hydrochlorothiazide (MAXZIDE) 75-50 MG tablet TAKE 1 TABLET BY MOUTH EVERY MORNING 30 tablet 0   No current facility-administered medications on file prior to visit.   Allergies  Allergen Reactions   Codeine    Latex    Shellfish Allergy     Physical exam:   Vitals:   01/02/21 1039  BP: 124/86  Pulse: 81  Resp: 16  SpO2: 98%  Weight: 202 lb (91.6 kg)  Height: 5\' 5"  (1.651 m)    Extremities: 2+ dorsalis pedis pulses Skin: Erythema left pretibial region medial calf  To: I reviewed the patient's venous reflux exam dated February 13, 2020.  This showed reflux in the right greater saphenous vein in the proximal thigh but not at the saphenofemoral junction.  The vein was about 4-1/2 mm  in diameter.  There was also some reflux at the proximal calf.  There is no reflux in the lesser saphenous vein.  On the left side there was reflux at the saphenofemoral junction with a vein diameter of about 4-1/2 to 5 mm throughout the thigh.  Is also an anterior accessory saphenous about 3-1/2 mm in diameter.  He had a DVT ultrasound on December 12, 2020 due to left leg pain.  This showed no evidence of DVT.  I did a SonoSite exam of the patient's left greater saphenous vein today which shows it is dilated around similar in diameter as described in the ultrasound previously.  It is very superficial however less than 2 mm from the skin surface and less than  1 mm from the skin surface in certain locations.  It does become deeper in the mid thigh.  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  She also now is developing some skin changes.  I had a lengthy discussion with her regarding the pain deep within her calf.  I am not sure exactly what the etiology of this is.  I told her with a vein procedure we could certainly help with the skin changes and a heaviness fullness and aching.  Hopefully the pain in her calf will resolve as well but this may be of another etiology and she understands this.  This is not completely resolved with compression stockings.  Plan: On SonoSite exam of the leg today the vein is very superficial almost underneath the skin surface.  In light of this I do not believe she is a very good candidate for laser ablation as she could end up with a significant burn in the skin.  I think a better option would be traditional ligation and stripping of her left greater saphenous vein in the operating room.  I gave her this option today as well as describing risk benefits and possible complications including but not limited to bleeding infection postoperative pain and risk of DVT 1/100,000.  She wishes to think about this and do little better research before she commits to that.  She will call us next week regarding her decision.   Ruta Hinds, MD [facility] Office: [phone]

## 2021-01-10 ENCOUNTER — Other Ambulatory Visit: Payer: Self-pay

## 2021-01-15 ENCOUNTER — Other Ambulatory Visit (HOSPITAL_COMMUNITY): Payer: Self-pay

## 2021-01-15 MED ORDER — LEVOTHYROXINE SODIUM 100 MCG PO TABS
100.0000 ug | ORAL_TABLET | Freq: Every morning | ORAL | 0 refills | Status: DC
  Filled 2021-01-15: qty 90, 90d supply, fill #0

## 2021-01-16 ENCOUNTER — Other Ambulatory Visit (HOSPITAL_COMMUNITY): Payer: Self-pay

## 2021-01-21 ENCOUNTER — Encounter (HOSPITAL_COMMUNITY): Payer: Self-pay | Admitting: Vascular Surgery

## 2021-01-21 ENCOUNTER — Telehealth (HOSPITAL_COMMUNITY): Payer: Self-pay

## 2021-01-21 ENCOUNTER — Other Ambulatory Visit: Payer: Self-pay

## 2021-01-21 NOTE — Telephone Encounter (Signed)
All FMLA forms were completed and faxed to [facility] @ [employer] [phone] with confirmation. Filed in accordion.   [employer]

## 2021-01-21 NOTE — Progress Notes (Signed)
PCP - Dr. Drema [place] Cardiologist -  EKG - DOS Chest x-ray -  ECHO -  Cardiac Cath -  CPAP -   COVID TEST- n/a  Anesthesia review: n/a  -------------  SDW INSTRUCTIONS:  Your procedure is scheduled on 7/26 Tuesday. Please report to [facility] Main Entrance "A" at 07:20 A.M., and check in at the Admitting office. Call this number if you have problems the morning of surgery: [phone]   Remember: Do not eat or drink after midnight the night before your surgery   Medications to take morning of surgery with a sip of water include: albuterol (VENTOLIN HFA) --- Please bring all inhalers with you the day of surgery.  beclomethasone (QVAR)  levothyroxine (SYNTHROID)  omeprazole (PRILOSEC) - if needed  As of today, STOP taking any Aspirin (unless otherwise instructed by your surgeon), Aleve, Naproxen, Ibuprofen, Motrin, Advil, Goody's, BC's, all herbal medications, fish oil, and all vitamins.    The Morning of Surgery Do not wear jewelry, make-up or nail polish. Do not wear lotions, powders, or perfumes, or deodorant Do not shave 48 hours prior to surgery.   Do not bring valuables to the hospital. [facility] is not responsible for any belongings or valuables.  If you are a smoker, DO NOT Smoke 24 hours prior to surgery  If you wear a CPAP at night please bring your mask the morning of surgery   Remember that you must have someone to transport you home after your surgery, and remain with you for 24 hours if you are discharged the same day.  Please bring cases for contacts, glasses, hearing aids, dentures or bridgework because it cannot be worn into surgery.   Patients discharged the day of surgery will not be allowed to drive home.   Please shower the NIGHT BEFORE/MORNING OF SURGERY (use antibacterial soap like DIAL soap if possible). Wear comfortable clothes the morning of surgery. Oral Hygiene is also important to reduce your risk of infection.  Remember - BRUSH YOUR TEETH THE  MORNING OF SURGERY WITH YOUR REGULAR TOOTHPASTE  Patient denies shortness of breath, fever, cough and chest pain.

## 2021-01-22 ENCOUNTER — Ambulatory Visit (HOSPITAL_COMMUNITY): Payer: No Typology Code available for payment source | Admitting: Anesthesiology

## 2021-01-22 ENCOUNTER — Ambulatory Visit (HOSPITAL_COMMUNITY)
Admission: RE | Admit: 2021-01-22 | Discharge: 2021-01-22 | Disposition: A | Discharge status: Home / Self Care | Payer: No Typology Code available for payment source | Attending: Vascular Surgery | Admitting: Vascular Surgery

## 2021-01-22 ENCOUNTER — Encounter (HOSPITAL_COMMUNITY): Payer: Self-pay | Admitting: Vascular Surgery

## 2021-01-22 ENCOUNTER — Other Ambulatory Visit (HOSPITAL_COMMUNITY): Payer: Self-pay

## 2021-01-22 ENCOUNTER — Encounter (HOSPITAL_COMMUNITY)
Admission: RE | Disposition: A | Discharge status: Home / Self Care | Payer: Self-pay | Source: Home / Self Care | Attending: Vascular Surgery

## 2021-01-22 ENCOUNTER — Other Ambulatory Visit: Payer: Self-pay

## 2021-01-22 DIAGNOSIS — I83812 Varicose veins of left lower extremities with pain: Secondary | ICD-10-CM

## 2021-01-22 DIAGNOSIS — Z91040 Latex allergy status: Secondary | ICD-10-CM | POA: Diagnosis not present

## 2021-01-22 DIAGNOSIS — Z885 Allergy status to narcotic agent status: Secondary | ICD-10-CM | POA: Diagnosis not present

## 2021-01-22 DIAGNOSIS — Z7951 Long term (current) use of inhaled steroids: Secondary | ICD-10-CM | POA: Diagnosis not present

## 2021-01-22 DIAGNOSIS — Z79899 Other long term (current) drug therapy: Secondary | ICD-10-CM | POA: Insufficient documentation

## 2021-01-22 DIAGNOSIS — Z79890 Hormone replacement therapy (postmenopausal): Secondary | ICD-10-CM | POA: Diagnosis not present

## 2021-01-22 DIAGNOSIS — Z91013 Allergy to seafood: Secondary | ICD-10-CM | POA: Insufficient documentation

## 2021-01-22 HISTORY — DX: Hypothyroidism, unspecified: E03.9

## 2021-01-22 HISTORY — PX: VEIN LIGATION AND STRIPPING: SHX2653

## 2021-01-22 HISTORY — DX: Dyspnea, unspecified: R06.00

## 2021-01-22 HISTORY — DX: Depression, unspecified: F32.A

## 2021-01-22 LAB — POCT I-STAT, CHEM 8
BUN: 18 mg/dL (ref 6–20)
Calcium, Ion: 1.18 mmol/L (ref 1.15–1.40)
Chloride: 104 mmol/L (ref 98–111)
Creatinine, Ser: 1 mg/dL (ref 0.44–1.00)
Glucose, Bld: 100 mg/dL — ABNORMAL HIGH (ref 70–99)
HCT: 38 % (ref 36.0–46.0)
Hemoglobin: 12.9 g/dL (ref 12.0–15.0)
Potassium: 3.3 mmol/L — ABNORMAL LOW (ref 3.5–5.1)
Sodium: 142 mmol/L (ref 135–145)
TCO2: 28 mmol/L (ref 22–32)

## 2021-01-22 LAB — SURGICAL PCR SCREEN
MRSA, PCR: NEGATIVE
Staphylococcus aureus: NEGATIVE

## 2021-01-22 LAB — NO BLOOD PRODUCTS

## 2021-01-22 SURGERY — LIGATION AND STRIPPING, VARICOSE VEIN
Anesthesia: General | Laterality: Left

## 2021-01-22 MED ORDER — HEPARIN SODIUM (PORCINE) 1000 UNIT/ML IJ SOLN
INTRAMUSCULAR | Status: AC
  Filled 2021-01-22: qty 1

## 2021-01-22 MED ORDER — PROPOFOL 10 MG/ML IV BOLUS
INTRAVENOUS | Status: AC
  Filled 2021-01-22: qty 20

## 2021-01-22 MED ORDER — LIDOCAINE HCL (CARDIAC) PF 100 MG/5ML IV SOSY
PREFILLED_SYRINGE | INTRAVENOUS | Status: DC | PRN
  Administered 2021-01-22: 60 mg via INTRAVENOUS

## 2021-01-22 MED ORDER — PROMETHAZINE HCL 25 MG/ML IJ SOLN: 6.2500 mg | INTRAMUSCULAR | Status: DC | PRN

## 2021-01-22 MED ORDER — FENTANYL CITRATE (PF) 100 MCG/2ML IJ SOLN
INTRAMUSCULAR | Status: DC | PRN
  Administered 2021-01-22 (×3): 25 ug via INTRAVENOUS

## 2021-01-22 MED ORDER — FENTANYL CITRATE (PF) 250 MCG/5ML IJ SOLN
INTRAMUSCULAR | Status: AC
  Filled 2021-01-22: qty 5

## 2021-01-22 MED ORDER — MIDAZOLAM HCL 2 MG/2ML IJ SOLN
INTRAMUSCULAR | Status: AC
  Filled 2021-01-22: qty 2

## 2021-01-22 MED ORDER — EPHEDRINE 5 MG/ML INJ
INTRAVENOUS | Status: AC
  Filled 2021-01-22: qty 5

## 2021-01-22 MED ORDER — PHENYLEPHRINE 40 MCG/ML (10ML) SYRINGE FOR IV PUSH (FOR BLOOD PRESSURE SUPPORT)
PREFILLED_SYRINGE | INTRAVENOUS | Status: AC
  Filled 2021-01-22: qty 10

## 2021-01-22 MED ORDER — CHLORHEXIDINE GLUCONATE 0.12 % MT SOLN
15.0000 mL | Freq: Once | OROMUCOSAL | Status: AC
  Administered 2021-01-22: 15 mL via OROMUCOSAL
  Filled 2021-01-22: qty 15

## 2021-01-22 MED ORDER — ROCURONIUM BROMIDE 10 MG/ML (PF) SYRINGE
PREFILLED_SYRINGE | INTRAVENOUS | Status: AC
  Filled 2021-01-22: qty 10

## 2021-01-22 MED ORDER — TRAMADOL HCL 50 MG PO TABS
50.0000 mg | ORAL_TABLET | Freq: Four times a day (QID) | ORAL | 0 refills | Status: DC | PRN
  Filled 2021-01-22: qty 20, 5d supply, fill #0

## 2021-01-22 MED ORDER — PROTAMINE SULFATE 10 MG/ML IV SOLN
INTRAVENOUS | Status: AC
  Filled 2021-01-22: qty 5

## 2021-01-22 MED ORDER — PHENYLEPHRINE HCL (PRESSORS) 10 MG/ML IV SOLN
INTRAVENOUS | Status: DC | PRN
  Administered 2021-01-22: 80 ug via INTRAVENOUS
  Administered 2021-01-22: 200 ug via INTRAVENOUS
  Administered 2021-01-22: 80 ug via INTRAVENOUS
  Administered 2021-01-22: 200 ug via INTRAVENOUS
  Administered 2021-01-22 (×2): 120 ug via INTRAVENOUS

## 2021-01-22 MED ORDER — HEPARIN SODIUM (PORCINE) 1000 UNIT/ML IJ SOLN
INTRAMUSCULAR | Status: AC
  Filled 2021-01-22: qty 2

## 2021-01-22 MED ORDER — PROPOFOL 10 MG/ML IV BOLUS
INTRAVENOUS | Status: DC | PRN
  Administered 2021-01-22: 150 mg via INTRAVENOUS
  Administered 2021-01-22: 50 mg via INTRAVENOUS

## 2021-01-22 MED ORDER — LACTATED RINGERS IV SOLN: INTRAVENOUS | Status: DC

## 2021-01-22 MED ORDER — ACETAMINOPHEN 500 MG PO TABS
1000.0000 mg | ORAL_TABLET | Freq: Once | ORAL | Status: AC
Start: 2021-01-22 — End: 2021-01-22
  Administered 2021-01-22: 1000 mg via ORAL
  Filled 2021-01-22: qty 2

## 2021-01-22 MED ORDER — ORAL CARE MOUTH RINSE: 15.0000 mL | Freq: Once | OROMUCOSAL | Status: AC

## 2021-01-22 MED ORDER — 0.9 % SODIUM CHLORIDE (POUR BTL) OPTIME
TOPICAL | Status: DC | PRN
  Administered 2021-01-22: 1000 mL

## 2021-01-22 MED ORDER — CHLORHEXIDINE GLUCONATE 4 % EX LIQD: 60.0000 mL | Freq: Once | CUTANEOUS | Status: DC

## 2021-01-22 MED ORDER — OXYCODONE HCL 5 MG PO TABS
5.0000 mg | ORAL_TABLET | Freq: Once | ORAL | Status: DC | PRN
Start: 2021-01-22 — End: 2021-01-22

## 2021-01-22 MED ORDER — ONDANSETRON HCL 4 MG/2ML IJ SOLN
INTRAMUSCULAR | Status: DC | PRN
  Administered 2021-01-22: 4 mg via INTRAVENOUS

## 2021-01-22 MED ORDER — HYDROMORPHONE HCL 1 MG/ML IJ SOLN
0.2500 mg | INTRAMUSCULAR | Status: DC | PRN
  Administered 2021-01-22 (×2): 0.25 mg via INTRAVENOUS

## 2021-01-22 MED ORDER — CEFAZOLIN SODIUM-DEXTROSE 2-4 GM/100ML-% IV SOLN
2.0000 g | INTRAVENOUS | Status: AC
  Administered 2021-01-22: 2 g via INTRAVENOUS
  Filled 2021-01-22: qty 100

## 2021-01-22 MED ORDER — LIDOCAINE 2% (20 MG/ML) 5 ML SYRINGE
INTRAMUSCULAR | Status: AC
  Filled 2021-01-22: qty 5

## 2021-01-22 MED ORDER — MIDAZOLAM HCL 5 MG/5ML IJ SOLN
INTRAMUSCULAR | Status: DC | PRN
  Administered 2021-01-22: 2 mg via INTRAVENOUS

## 2021-01-22 MED ORDER — OXYCODONE HCL 5 MG/5ML PO SOLN
5.0000 mg | Freq: Once | ORAL | Status: DC | PRN
Start: 2021-01-22 — End: 2021-01-22

## 2021-01-22 MED ORDER — DEXAMETHASONE SODIUM PHOSPHATE 4 MG/ML IJ SOLN
INTRAMUSCULAR | Status: DC | PRN
  Administered 2021-01-22: 4 mg via INTRAVENOUS

## 2021-01-22 MED ORDER — SODIUM CHLORIDE 0.9 % IV SOLN: INTRAVENOUS | Status: DC

## 2021-01-22 MED ORDER — DEXAMETHASONE SODIUM PHOSPHATE 10 MG/ML IJ SOLN
INTRAMUSCULAR | Status: AC
  Filled 2021-01-22: qty 1

## 2021-01-22 MED ORDER — APREPITANT 40 MG PO CAPS
40.0000 mg | ORAL_CAPSULE | Freq: Once | ORAL | Status: AC
  Administered 2021-01-22: 40 mg via ORAL
  Filled 2021-01-22: qty 1

## 2021-01-22 MED ORDER — ONDANSETRON HCL 4 MG/2ML IJ SOLN
INTRAMUSCULAR | Status: AC
  Filled 2021-01-22: qty 2

## 2021-01-22 MED ORDER — EPHEDRINE SULFATE 50 MG/ML IJ SOLN
INTRAMUSCULAR | Status: DC | PRN
  Administered 2021-01-22: 15 mg via INTRAVENOUS
  Administered 2021-01-22 (×2): 5 mg via INTRAVENOUS

## 2021-01-22 MED ORDER — HYDROMORPHONE HCL 1 MG/ML IJ SOLN
INTRAMUSCULAR | Status: AC
  Filled 2021-01-22: qty 1

## 2021-01-22 SURGICAL SUPPLY — 54 items
ADH SKN CLS APL DERMABOND .7 (GAUZE/BANDAGES/DRESSINGS) ×1
APL SKNCLS STERI-STRIP NONHPOA (GAUZE/BANDAGES/DRESSINGS) ×1
BAG COUNTER SPONGE SURGICOUNT (BAG) ×2 IMPLANT
BAG ISL DRAPE 18X18 STRL (DRAPES) ×1
BAG ISOLATION DRAPE 18X18 (DRAPES) ×1 IMPLANT
BAG SPNG CNTER NS LX DISP (BAG) ×1
BENZOIN TINCTURE PRP APPL 2/3 (GAUZE/BANDAGES/DRESSINGS) ×2 IMPLANT
BLADE SURG 15 STRL LF DISP TIS (BLADE) IMPLANT
BLADE SURG 15 STRL SS (BLADE)
BNDG CMPR MED 10X6 ELC LF (GAUZE/BANDAGES/DRESSINGS) ×1
BNDG ELASTIC 4X5.8 VLCR STR LF (GAUZE/BANDAGES/DRESSINGS) ×2 IMPLANT
BNDG ELASTIC 6X10 VLCR STRL LF (GAUZE/BANDAGES/DRESSINGS) ×1 IMPLANT
BNDG ELASTIC 6X5.8 VLCR STR LF (GAUZE/BANDAGES/DRESSINGS) ×3 IMPLANT
BNDG GAUZE ELAST 4 BULKY (GAUZE/BANDAGES/DRESSINGS) ×4 IMPLANT
CANISTER SUCT 3000ML PPV (MISCELLANEOUS) ×2 IMPLANT
CANNULA VESSEL 3MM 2 BLNT TIP (CANNULA) IMPLANT
CLIP VESOCCLUDE MED 6/CT (CLIP) ×1 IMPLANT
CLIP VESOCCLUDE SM WIDE 24/CT (CLIP) ×2 IMPLANT
COVER PROBE W GEL 5X96 (DRAPES) ×1 IMPLANT
DERMABOND ADVANCED (GAUZE/BANDAGES/DRESSINGS) ×1
DERMABOND ADVANCED .7 DNX12 (GAUZE/BANDAGES/DRESSINGS) IMPLANT
DRAPE ISOLATION BAG 18X18 (DRAPES) ×2
ELECT REM PT RETURN 9FT ADLT (ELECTROSURGICAL) ×2
ELECTRODE REM PT RTRN 9FT ADLT (ELECTROSURGICAL) ×1 IMPLANT
GAUZE 4X4 16PLY ~~LOC~~+RFID DBL (SPONGE) ×1 IMPLANT
GAUZE SPONGE 4X4 12PLY STRL (GAUZE/BANDAGES/DRESSINGS) ×2 IMPLANT
GLOVE SURG ENC MOIS LTX SZ7.5 (GLOVE) ×1 IMPLANT
GOWN STRL REUS W/ TWL LRG LVL3 (GOWN DISPOSABLE) ×3 IMPLANT
GOWN STRL REUS W/TWL LRG LVL3 (GOWN DISPOSABLE) ×4
KIT BASIN OR (CUSTOM PROCEDURE TRAY) ×2 IMPLANT
KIT TURNOVER KIT B (KITS) ×2 IMPLANT
LOOP VESSEL MINI RED (MISCELLANEOUS) ×1 IMPLANT
NS IRRIG 1000ML POUR BTL (IV SOLUTION) ×3 IMPLANT
PACK GENERAL/GYN (CUSTOM PROCEDURE TRAY) ×2 IMPLANT
PACK UNIVERSAL I (CUSTOM PROCEDURE TRAY) ×2 IMPLANT
PAD ARMBOARD 7.5X6 YLW CONV (MISCELLANEOUS) ×4 IMPLANT
SPECIMEN JAR SMALL (MISCELLANEOUS) ×2 IMPLANT
SPONGE T-LAP 18X18 ~~LOC~~+RFID (SPONGE) ×1 IMPLANT
STAPLER VISISTAT 35W (STAPLE) IMPLANT
STRIP CLOSURE SKIN 1/2X4 (GAUZE/BANDAGES/DRESSINGS) ×2 IMPLANT
SUT MNCRL AB 4-0 PS2 18 (SUTURE) ×1 IMPLANT
SUT SILK 0 TIES 10X30 (SUTURE) ×1 IMPLANT
SUT SILK 2 0 (SUTURE) ×2
SUT SILK 2-0 18XBRD TIE 12 (SUTURE) ×1 IMPLANT
SUT SILK 3 0 (SUTURE) ×2
SUT SILK 3-0 18XBRD TIE 12 (SUTURE) ×1 IMPLANT
SUT SILK 4 0 (SUTURE) ×2
SUT SILK 4-0 18XBRD TIE 12 (SUTURE) IMPLANT
SUT VIC AB 3-0 SH 27 (SUTURE) ×2
SUT VIC AB 3-0 SH 27X BRD (SUTURE) ×2 IMPLANT
SUT VICRYL 4-0 PS2 18IN ABS (SUTURE) ×4 IMPLANT
TOWEL GREEN STERILE (TOWEL DISPOSABLE) ×2 IMPLANT
UNDERPAD 30X36 HEAVY ABSORB (UNDERPADS AND DIAPERS) ×2 IMPLANT
WATER STERILE IRR 1000ML POUR (IV SOLUTION) ×2 IMPLANT

## 2021-01-22 NOTE — Anesthesia Postprocedure Evaluation (Signed)
Anesthesia Post Note  Patient: Evelyn Baker  Procedure(s) Performed: LEFT GREATER SAPHENOUS VEIN  LIGATION AND STRIPPING (Left)     Patient location during evaluation: PACU Anesthesia Type: General Level of consciousness: awake and alert, oriented and patient cooperative Pain management: pain level controlled Vital Signs Assessment: post-procedure vital signs reviewed and stable Respiratory status: spontaneous breathing, nonlabored ventilation and respiratory function stable Cardiovascular status: blood pressure returned to baseline and stable Postop Assessment: no apparent nausea or vomiting Anesthetic complications: no   No notable events documented.  Last Vitals:  Vitals:   01/22/21 1215 01/22/21 1230  BP: 137/83 131/86  Pulse: 63 65  Resp: 12 12  Temp:    SpO2: 100% 95%    Last Pain:  Vitals:   01/22/21 1215  TempSrc:   PainSc: [place]

## 2021-01-22 NOTE — Interval H&P Note (Signed)
History and Physical Interval Note:  01/22/2021 9:53 AM  Evelyn Baker  has presented today for surgery, with the diagnosis of VARICOSE VEINS OF LEFT LOWER EXTREMITY WITH PAIN.  The various methods of treatment have been discussed with the patient and family. After consideration of risks, benefits and other options for treatment, the patient has consented to  Procedure(s): [name] (Left) as a surgical intervention.  The patient's history has been reviewed, patient examined, no change in status, stable for surgery.  I have reviewed the patient's chart and labs.  Questions were answered to the patient's satisfaction.     Ruta Hinds

## 2021-01-22 NOTE — Anesthesia Preprocedure Evaluation (Addendum)
Anesthesia Evaluation  Patient identified by MRN, date of birth, ID band Patient awake    Reviewed: Allergy & Precautions, NPO status , Patient's Chart, lab work & pertinent test results  Airway Mallampati: I  TM Distance: >3 FB Neck ROM: Full    Dental no notable dental hx. (+) Edentulous Upper, Edentulous Lower   Pulmonary shortness of breath and with exertion, asthma (uses rescue inhaler every 2-69month) ,    Pulmonary exam normal breath sounds clear to auscultation       Cardiovascular hypertension (SBP 150s in preop, normally 120/80), Pt. on medications Normal cardiovascular exam Rhythm:Regular Rate:Normal     Neuro/Psych PSYCHIATRIC DISORDERS Depression negative neurological ROS     GI/Hepatic Neg liver ROS, GERD  Medicated and Controlled,  Endo/Other  Hypothyroidism BMI 33  Renal/GU negative Renal ROS  negative genitourinary   Musculoskeletal negative musculoskeletal ROS (+)   Abdominal (+) + obese,   Peds  Hematology negative hematology ROS (+)   Anesthesia Other Findings Varicose veins LLE  Reproductive/Obstetrics negative OB ROS                            Anesthesia Physical Anesthesia Plan  ASA: 2  Anesthesia Plan: General   Post-op Pain Management:    Induction: Intravenous  PONV Risk Score and Plan: 3 and Ondansetron, Dexamethasone, Midazolam and Treatment may vary due to age or medical condition  Airway Management Planned: LMA  Additional Equipment: None  Intra-op Plan:   Post-operative Plan: Extubation in OR  Informed Consent: I have reviewed the patients History and Physical, chart, labs and discussed the procedure including the risks, benefits and alternatives for the proposed anesthesia with the patient or authorized representative who has indicated his/her understanding and acceptance.     Dental advisory given  Plan Discussed with: CRNA  Anesthesia  Plan Comments:        Anesthesia Quick Evaluation

## 2021-01-22 NOTE — Discharge Instructions (Signed)
Can remove dressing after 48 hours Elevate legs above the level of your heart when you are resting Can walk as early as today; make sure you are walking a little everyday to avoid blood clots

## 2021-01-22 NOTE — Anesthesia Procedure Notes (Signed)
Procedure Name: LMA Insertion Date/Time: 01/22/2021 10:43 AM Performed by: Cathren Harsh, CRNA Pre-anesthesia Checklist: Patient identified, Emergency Drugs available, Suction available and Patient being monitored Patient Re-evaluated:Patient Re-evaluated prior to induction Oxygen Delivery Method: Circle System Utilized Preoxygenation: Pre-oxygenation with 100% oxygen Induction Type: IV induction LMA: LMA inserted LMA Size: 4.0 Number of attempts: 1 Airway Equipment and Method: Bite block Placement Confirmation: positive ETCO2 Tube secured with: Tape Dental Injury: Teeth and Oropharynx as per pre-operative assessment

## 2021-01-22 NOTE — Op Note (Signed)
Procedure: Ligation and stripping of left greater saphenous vein  Preoperative diagnosis: Symptomatic varicose veins left leg  Postoperative diagnosis: Same  Anesthesia: General  Assistant: Arlee Muslim, PA-C for assistance with exposure retraction and expediting procedure.  Operative details: After the informed consent, the patient was taken the operating.  The patient was in supine position operating table.  After induction general anesthesia patient's left lower extremities prepped and draped in usual sterile fashion.  Ultrasound was then used to identify the course of the left greater saphenous vein from the knee up to the level of saphenofemoral junction.  It was about 4 mm in diameter.  It was about 2 mm in the skin surface.  A transverse incision was made just above the level of the knee joint and the vein was dissected free circumferentially and a vessel loop placed around this.  Attention was then turned to the left groin.  An oblique incision was made in the left groin over the saphenofemoral junction carried down through the subcutaneous tissues down the level of the saphenofemoral junction.  Several small side branches were ligated and divided tween silk ties or cauterized.  A 2-0 silk tie was used to ligate the greater saphenous vein right at the saphenofemoral junction the vein was transected.  The vein was also ligated distally at the knee level and transected.  The vein stripper then was passed from the knee up to the level of the groin.  The bullet was attached and the vein completely stripped through and I confirmed this after taking it off the stripper.  Hemostasis was obtained with direct pressure.  The knee incision was closed with a 4-0 Monocryl suture.  The groin incision was closed with a layer of 3-0 Vicryl subcutaneous layer and a 4-0 Vicryl subcuticular stitch in the skin.  Dermabond was applied to both incisions.  A dry sterile dressing was then applied with compression from the  foot up to the level of the thigh.  The patient tolerated procedure well and there were no complications.  The instrument sponge and needle counts correct in the case.  Patient was taken to recovery in stable condition.  Ruta Hinds, MD [facility] [place] Office: [phone]

## 2021-01-22 NOTE — Transfer of Care (Signed)
Immediate Anesthesia Transfer of Care Note  Patient: Evelyn Baker  Procedure(s) Performed: LEFT GREATER SAPHENOUS VEIN  LIGATION AND STRIPPING (Left)  Patient Location: PACU  Anesthesia Type:General  Level of Consciousness: drowsy  Airway & Oxygen Therapy: Patient connected to face mask  Post-op Assessment: Report given to RN and Post -op Vital signs reviewed and stable  Post vital signs: Reviewed and stable  Last Vitals:  Vitals Value Taken Time  BP 128/83 01/22/21 1200  Temp 36.3 C 01/22/21 1200  Pulse 69 01/22/21 1202  Resp 11 01/22/21 1202  SpO2 100 % 01/22/21 1202  Vitals shown include unvalidated device data.  Last Pain:  Vitals:   01/22/21 0813  TempSrc:   PainSc: 9       Patients Stated Pain Goal: 3 ([id number] [phone])  Complications: No notable events documented.

## 2021-01-23 ENCOUNTER — Encounter (HOSPITAL_COMMUNITY): Payer: Self-pay | Admitting: Vascular Surgery

## 2021-01-24 ENCOUNTER — Telehealth: Payer: Self-pay

## 2021-01-24 NOTE — Telephone Encounter (Signed)
Patient calls today to report she has "dry mouth, a swollen eye and a jumpy nose." She has started taking tylenol #3 and docusate sodium. She thinks it may be an allergic reaction but is unclear what is affecting her. Advised her it was possible and to follow up with her PCP. She also may try benadryl. She is not having any difficulty breathing or SOB.

## 2021-01-29 ENCOUNTER — Other Ambulatory Visit (HOSPITAL_COMMUNITY): Payer: Self-pay

## 2021-01-29 ENCOUNTER — Telehealth: Payer: Self-pay | Admitting: *Deleted

## 2021-01-29 MED FILL — Triamterene & Hydrochlorothiazide Tab 75-50 MG: ORAL | 30 days supply | Qty: 30 | Fill #2 | Status: AC

## 2021-01-29 NOTE — Telephone Encounter (Signed)
Returning [name] earlier telephone voice message regarding over the counter medication to take for left leg discomfort.  Advised Evelyn Baker to take Ibuprofen 600 MG with meals as needed prn leg discomfort.  Advised her it is permitted to shower at this time.

## 2021-02-07 ENCOUNTER — Encounter: Payer: Self-pay | Admitting: Vascular Surgery

## 2021-02-07 ENCOUNTER — Ambulatory Visit (INDEPENDENT_AMBULATORY_CARE_PROVIDER_SITE_OTHER): Payer: No Typology Code available for payment source | Admitting: Vascular Surgery

## 2021-02-07 ENCOUNTER — Other Ambulatory Visit: Payer: Self-pay

## 2021-02-07 VITALS — BP 134/85 | HR 72 | Temp 97.9°F | Resp 20 | Ht 65.0 in | Wt 200.0 lb

## 2021-02-07 DIAGNOSIS — I83812 Varicose veins of left lower extremities with pain: Secondary | ICD-10-CM

## 2021-02-07 NOTE — Progress Notes (Signed)
Patient is a 60 year old female who returns for postoperative follow-up today.  She underwent ligation and prescription cream of her left greater saphenous vein on January 22, 2021.  She has not really had any incisional drainage.  Still complains of some burning and warmth in the left pretibial region.  She has not been wearing compression stockings.  He was encouraged to do this today.  Physical exam:  Vitals:   02/07/21 0915  BP: 134/85  Pulse: 72  Resp: 20  Temp: 97.9 F (36.6 C)  SpO2: 97%  Weight: 200 lb (90.7 kg)  Height: '5\' 5"'$  (1.651 m)    Extremities: Healing left groin and left medial thigh incision some hemosiderin staining left pretibial region no significant edema or bruising  Assessment: Doing well status post ligation stripping left greater saphenous vein.  Hopefully her symptoms will continue to improve with time but she states she really has not had enough time at this point to see if she has had changes in her left leg.  Plan: Patient will continue to wear at least a 20 to 30 mm compression stocking on the left leg to help control symptoms.  She will follow-up on an as-needed basis.  She can return to work on Monday, August 15.  Ruta Hinds, MD [facility] [place] Office: [phone]

## 2021-02-08 ENCOUNTER — Telehealth: Payer: Self-pay

## 2021-02-08 NOTE — Telephone Encounter (Signed)
Spoke with patient and advised her STD paperwork has been completed and faxed and the confirmation was received. Patient voiced her understanding.

## 2021-03-14 ENCOUNTER — Other Ambulatory Visit (HOSPITAL_COMMUNITY): Payer: Self-pay

## 2021-03-15 ENCOUNTER — Other Ambulatory Visit (HOSPITAL_COMMUNITY): Payer: Self-pay

## 2021-03-15 MED ORDER — TRIAMTERENE-HCTZ 75-50 MG PO TABS
0.5000 | ORAL_TABLET | Freq: Every day | ORAL | 3 refills | Status: DC
  Filled 2021-03-15: qty 45, 90d supply, fill #0
  Filled 2021-06-19: qty 45, 90d supply, fill #1
  Filled 2021-10-10: qty 45, 90d supply, fill #2
  Filled 2021-11-26 – 2022-02-07 (×2): qty 45, 90d supply, fill #3

## 2021-04-10 ENCOUNTER — Ambulatory Visit: Payer: No Typology Code available for payment source

## 2021-04-18 ENCOUNTER — Other Ambulatory Visit (HOSPITAL_COMMUNITY): Payer: Self-pay

## 2021-04-18 MED ORDER — LEVOTHYROXINE SODIUM 100 MCG PO TABS
100.0000 ug | ORAL_TABLET | Freq: Every morning | ORAL | 3 refills | Status: DC
  Filled 2021-04-18: qty 90, 90d supply, fill #0
  Filled 2021-06-19 – 2021-07-15 (×2): qty 90, 90d supply, fill #1
  Filled 2021-10-10: qty 90, 90d supply, fill #2

## 2021-04-23 ENCOUNTER — Ambulatory Visit: Payer: No Typology Code available for payment source

## 2021-05-20 ENCOUNTER — Other Ambulatory Visit (HOSPITAL_COMMUNITY): Payer: Self-pay

## 2021-05-20 MED ORDER — ALBUTEROL SULFATE HFA 108 (90 BASE) MCG/ACT IN AERS
2.0000 | INHALATION_SPRAY | RESPIRATORY_TRACT | 0 refills | Status: DC | PRN
  Filled 2021-05-20: qty 8.5, 17d supply, fill #0

## 2021-05-20 MED ORDER — OMEPRAZOLE 40 MG PO CPDR
40.0000 mg | DELAYED_RELEASE_CAPSULE | Freq: Every day | ORAL | 0 refills | Status: DC
  Filled 2021-05-20: qty 90, 90d supply, fill #0

## 2021-06-19 ENCOUNTER — Other Ambulatory Visit (HOSPITAL_COMMUNITY): Payer: Self-pay

## 2021-07-15 ENCOUNTER — Other Ambulatory Visit (HOSPITAL_COMMUNITY): Payer: Self-pay

## 2021-08-02 IMAGING — US US THYROID
1 series · 13 of 25 positions shown · non-contrast
Comparison: December 14, 2017

CLINICAL DATA: Nontoxic goiter

EXAM:
THYROID ULTRASOUND
TECHNIQUE: Ultrasound examination of the thyroid gland and adjacent soft
tissues was performed.

[Series 1: us thyroid · 0.03mm/px · 13 of 48 slices shown]
[im 1/48]
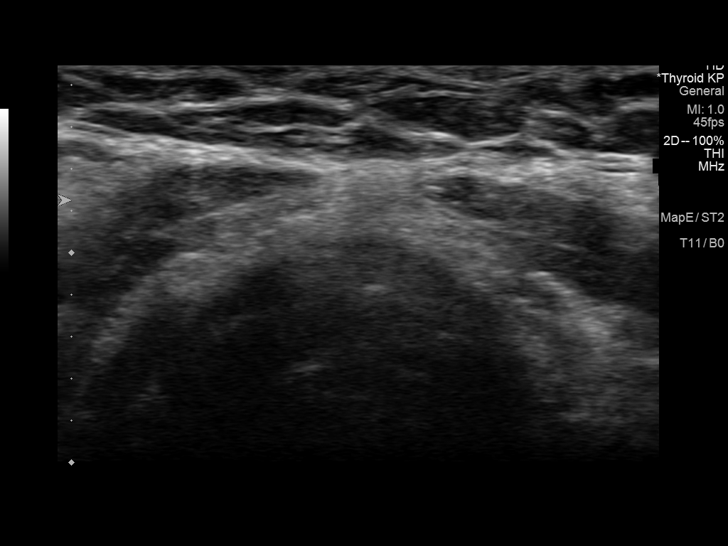
[im 4/48]
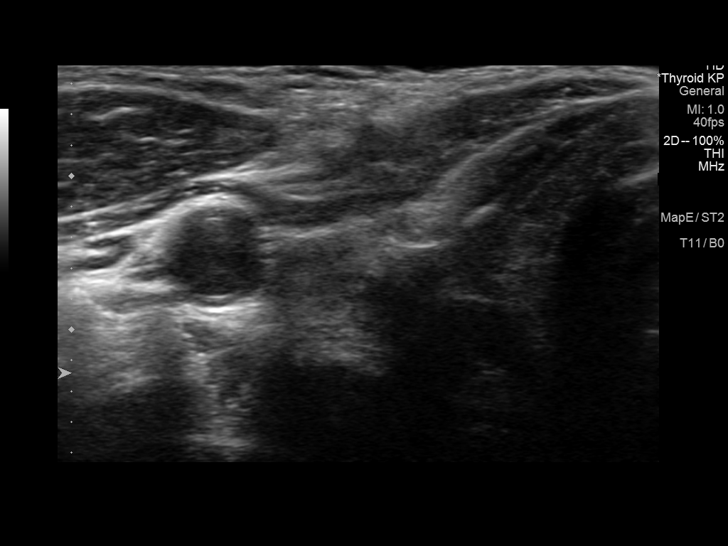
[im 8/48]
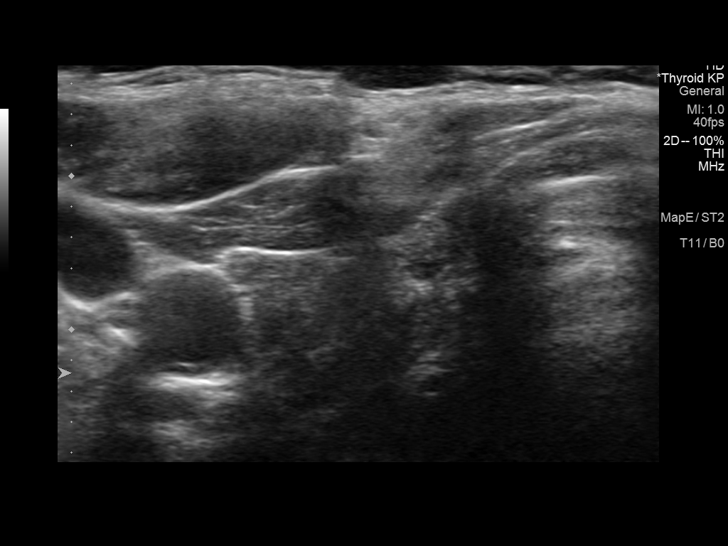
[im 12/48]
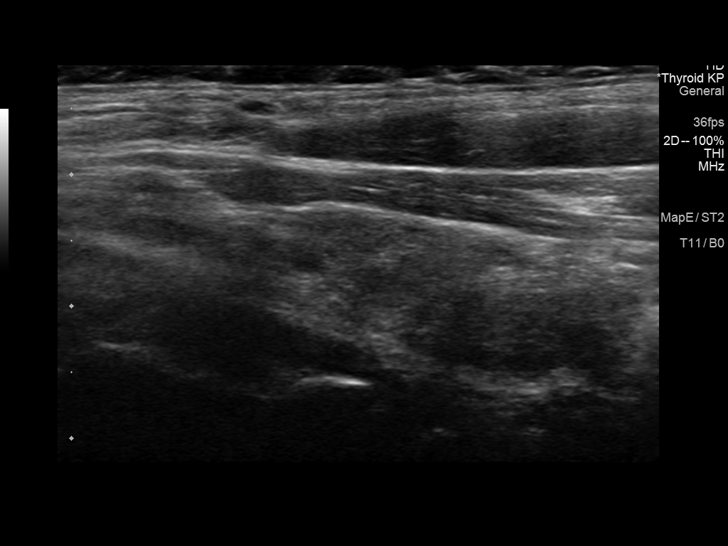
[im 16/48]
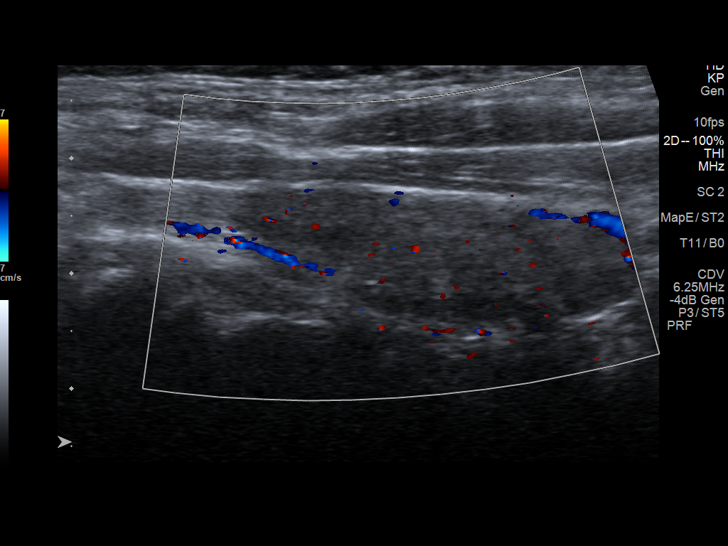
[im 20/48]
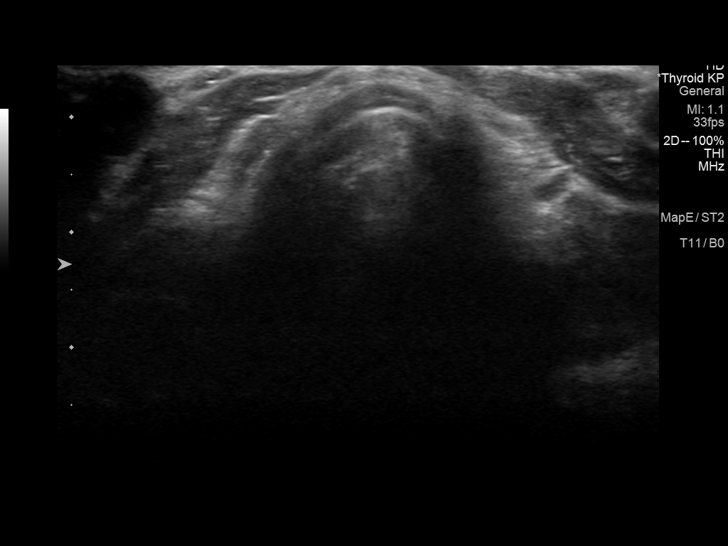
[im 24/48]
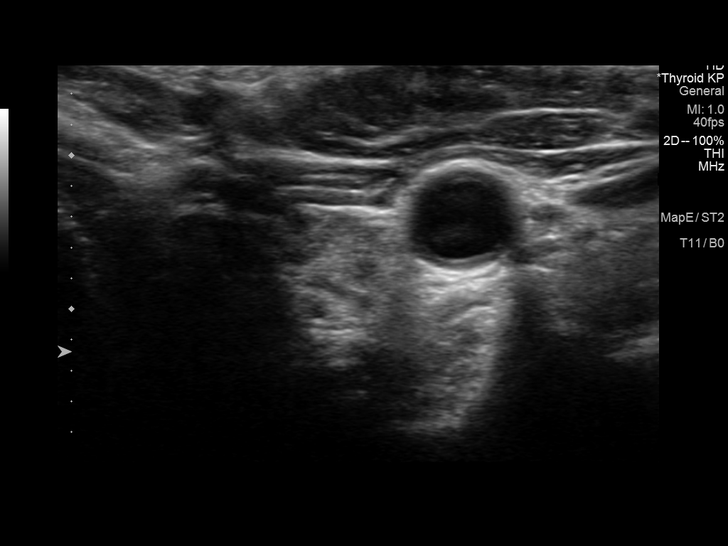
[im 28/48]
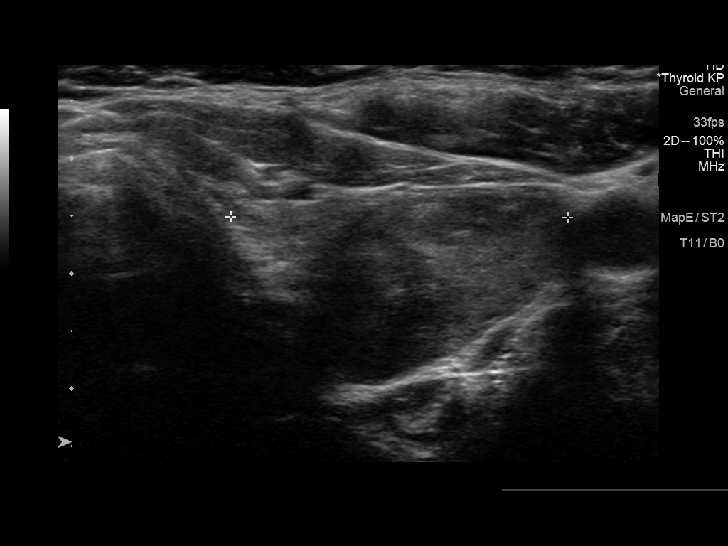
[im 32/48]
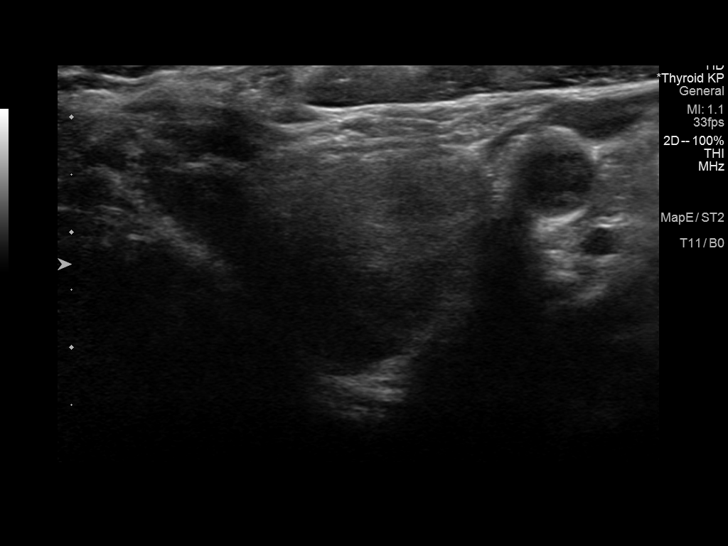
[im 36/48]
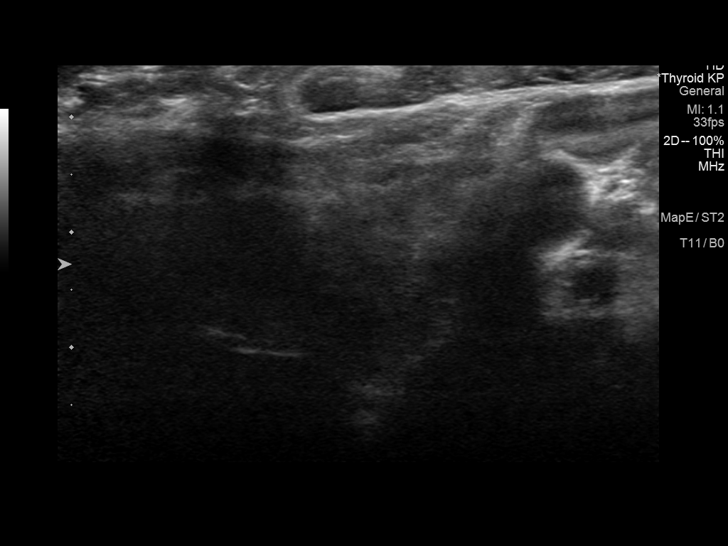
[im 40/48]
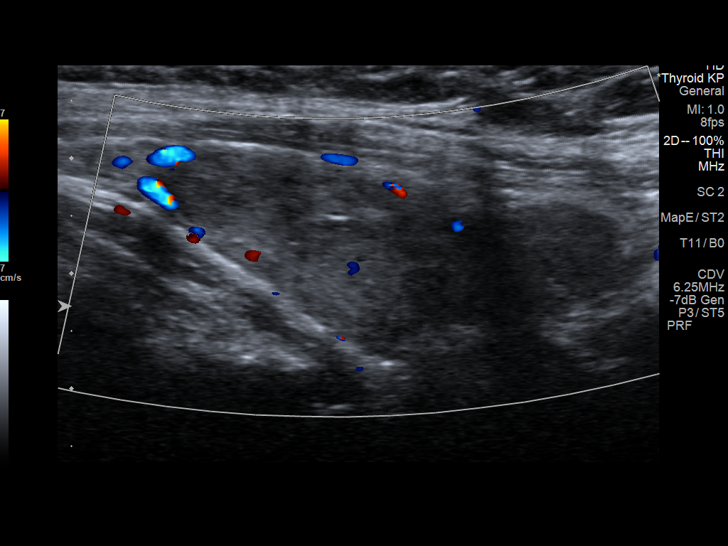
[im 44/48]
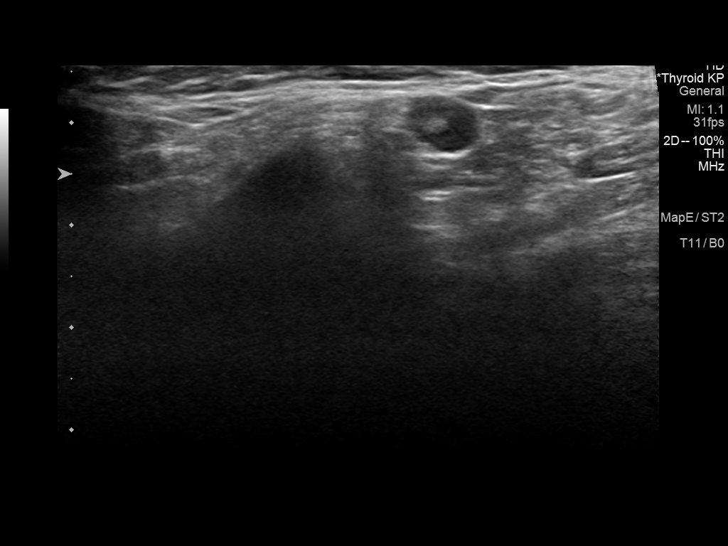
[im 48/48]
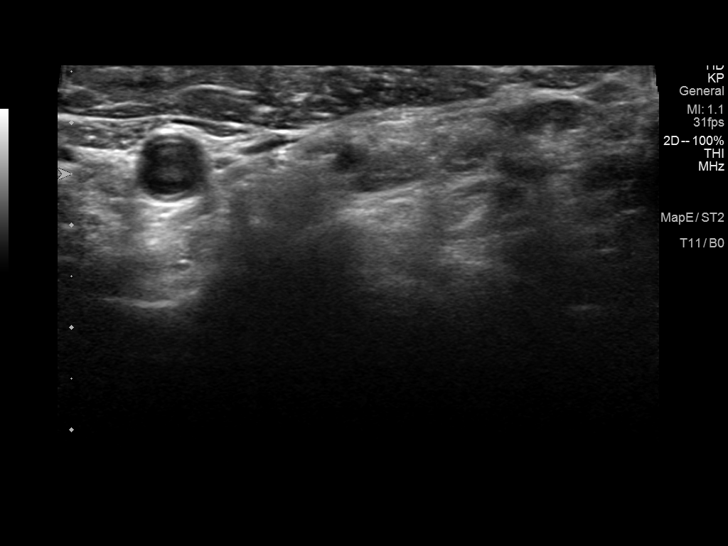

[13 of 25 positions shown; findings below may reference images not displayed]

FINDINGS: Parenchymal Echotexture: Moderately heterogenous

Isthmus: 0.3 cm

Right lobe: 4.6 x 1.2 x 1.5 cm

Left lobe: 5.1 x 1.8 x 2.9 cm

_________________________________________________________

Estimated total number of nodules >/= 1 cm: 1

Number of spongiform nodules >/=  2 cm not described below (TR1): 0

Number of mixed cystic and solid nodules >/= 1.5 cm not described
below (TR2): 0

_________________________________________________________

Nodule # 1:

Prior biopsy: Yes

Location: Left; Mid

Maximum size: 4.2 cm; Other 2 dimensions: 2.1 x 1.6 cm, previously,
4.3 x 2 x 2 cm cm

Composition: solid/almost completely solid (2)

Echogenicity: hypoechoic (2)

Shape: not taller-than-wide (0)

Margins: smooth (0)

Echogenic foci: none (0)

ACR TI-RADS total points: 4.

ACR TI-RADS risk category:  TR4 (4-6 points).

Significant change in size (>/= 20% in two dimensions and minimal
increase of 2 mm): No

Change in features: No

Change in ACR TI-RADS risk category: No

ACR TI-RADS recommendations:

This thyroid nodule was previously biopsied.

_________________________________________________________

No new thyroid nodule identified on today's exam. No concerning
cervical lymph nodes identified on this exam.
IMPRESSION: 1. Stable sized moderately heterogeneous thyroid gland as before.
2. Stable dominant left-sided thyroid nodule. This thyroid nodule
was previously biopsied. Correlation with prior biopsy results is
recommended. This thyroid nodule has demonstrated greater than 5
years of stability. No further follow-up is required unless
clinically indicated.

The above is in keeping with the ACR TI-RADS recommendations - [HOSPITAL] 5934;[DATE].

## 2021-09-25 ENCOUNTER — Other Ambulatory Visit: Payer: Self-pay | Admitting: Family Medicine

## 2021-09-25 DIAGNOSIS — Z1231 Encounter for screening mammogram for malignant neoplasm of breast: Secondary | ICD-10-CM

## 2021-10-10 ENCOUNTER — Other Ambulatory Visit (HOSPITAL_COMMUNITY): Payer: Self-pay

## 2021-10-11 ENCOUNTER — Ambulatory Visit: Payer: No Typology Code available for payment source

## 2021-10-11 ENCOUNTER — Other Ambulatory Visit (HOSPITAL_COMMUNITY): Payer: Self-pay

## 2021-10-11 MED ORDER — ALBUTEROL SULFATE HFA 108 (90 BASE) MCG/ACT IN AERS
2.0000 | INHALATION_SPRAY | RESPIRATORY_TRACT | 2 refills | Status: DC | PRN
  Filled 2021-10-11: qty 8.5, 17d supply, fill #0

## 2021-10-11 MED ORDER — OMEPRAZOLE 40 MG PO CPDR
40.0000 mg | DELAYED_RELEASE_CAPSULE | Freq: Every day | ORAL | 1 refills | Status: DC
  Filled 2021-10-11: qty 90, 90d supply, fill #0

## 2021-10-16 ENCOUNTER — Ambulatory Visit
Admission: RE | Admit: 2021-10-16 | Discharge: 2021-10-16 | Disposition: A | Discharge status: Home / Self Care | Payer: No Typology Code available for payment source | Source: Ambulatory Visit | Attending: Family Medicine | Admitting: Family Medicine

## 2021-10-16 DIAGNOSIS — Z1231 Encounter for screening mammogram for malignant neoplasm of breast: Secondary | ICD-10-CM

## 2021-10-18 ENCOUNTER — Other Ambulatory Visit: Payer: Self-pay | Admitting: Family Medicine

## 2021-10-18 DIAGNOSIS — R928 Other abnormal and inconclusive findings on diagnostic imaging of breast: Secondary | ICD-10-CM

## 2021-10-31 ENCOUNTER — Other Ambulatory Visit (HOSPITAL_COMMUNITY): Payer: Self-pay

## 2021-11-01 ENCOUNTER — Other Ambulatory Visit (HOSPITAL_COMMUNITY): Payer: Self-pay

## 2021-11-01 MED ORDER — LEVOTHYROXINE SODIUM 112 MCG PO TABS
112.0000 ug | ORAL_TABLET | Freq: Every morning | ORAL | 6 refills | Status: DC
  Filled 2021-11-01: qty 30, 30d supply, fill #0
  Filled 2021-11-26: qty 30, 30d supply, fill #1
  Filled 2021-12-27: qty 30, 30d supply, fill #2
  Filled 2022-01-23: qty 30, 30d supply, fill #3
  Filled 2022-02-25: qty 90, 90d supply, fill #4
  Filled 2022-02-25: qty 30, 30d supply, fill #4

## 2021-11-19 ENCOUNTER — Ambulatory Visit
Admission: RE | Admit: 2021-11-19 | Discharge: 2021-11-19 | Disposition: A | Discharge status: Home / Self Care | Payer: No Typology Code available for payment source | Source: Ambulatory Visit | Attending: Family Medicine | Admitting: Family Medicine

## 2021-11-19 ENCOUNTER — Ambulatory Visit: Payer: No Typology Code available for payment source

## 2021-11-19 DIAGNOSIS — R928 Other abnormal and inconclusive findings on diagnostic imaging of breast: Secondary | ICD-10-CM

## 2021-11-26 ENCOUNTER — Other Ambulatory Visit (HOSPITAL_COMMUNITY): Payer: Self-pay

## 2021-12-27 ENCOUNTER — Other Ambulatory Visit (HOSPITAL_COMMUNITY): Payer: Self-pay

## 2022-01-21 ENCOUNTER — Other Ambulatory Visit (HOSPITAL_COMMUNITY): Payer: Self-pay

## 2022-01-21 MED ORDER — EPINEPHRINE 0.3 MG/0.3ML IJ SOAJ
INTRAMUSCULAR | 1 refills | Status: DC
  Filled 2022-01-21: qty 2, 20d supply, fill #0

## 2022-01-23 ENCOUNTER — Other Ambulatory Visit (HOSPITAL_COMMUNITY): Payer: Self-pay

## 2022-02-07 ENCOUNTER — Other Ambulatory Visit (HOSPITAL_COMMUNITY): Payer: Self-pay

## 2022-02-18 ENCOUNTER — Ambulatory Visit (INDEPENDENT_AMBULATORY_CARE_PROVIDER_SITE_OTHER): Payer: No Typology Code available for payment source | Admitting: Ophthalmology

## 2022-02-18 ENCOUNTER — Encounter (INDEPENDENT_AMBULATORY_CARE_PROVIDER_SITE_OTHER): Payer: Self-pay | Admitting: Ophthalmology

## 2022-02-18 DIAGNOSIS — H25813 Combined forms of age-related cataract, bilateral: Secondary | ICD-10-CM | POA: Diagnosis not present

## 2022-02-18 DIAGNOSIS — Q141 Congenital malformation of retina: Secondary | ICD-10-CM | POA: Diagnosis not present

## 2022-02-18 DIAGNOSIS — H35033 Hypertensive retinopathy, bilateral: Secondary | ICD-10-CM | POA: Diagnosis not present

## 2022-02-18 DIAGNOSIS — I10 Essential (primary) hypertension: Secondary | ICD-10-CM

## 2022-02-18 NOTE — Progress Notes (Signed)
[facility] Note  02/18/2022     CHIEF COMPLAINT Patient presents for Retina Evaluation   HISTORY OF PRESENT ILLNESS: Evelyn Baker is a 61 y.o. female who presents to the clinic today for:   HPI     Retina Evaluation   In right eye.  Associated Symptoms Negative for Flashes, Floaters, Distortion, Blind Spot, Pain, Redness, Photophobia, Glare, Trauma, Scalp Tenderness, Jaw Claudication, Shoulder/Hip pain, Fever, Weight Loss and Fatigue.  Context:  distance vision, mid-range vision, near vision, driving and night driving.  Treatments tried include no treatments.  I, the attending physician,  performed the HPI with the patient and updated documentation appropriately.        Comments   Patient referred by Dr. Wyatt Portela for possible retinal tear OD. Was in Dr. Zenia Resides office today for cataract evaluation. Found possible retinal tear OD, but patient denies any symptoms of floaters or flashes of light. Vision blurred OU for the past year. Increased difficulty driving, especially at night. Dr. Katy Fitch is considering cataract surgery for patient, but needs retinal clearance prior to surgery. History of strabismus surgery as a child.       Last edited by Bernarda Caffey, MD on 02/19/2022  1:42 AM.    Pt is here on the referral of Dr. Katy Fitch for concern of retinal tear OD, pt was seen this afternoon for cataract eval  Referring physician: Debbra Riding, MD [address] [place],  [place] [address]  HISTORICAL INFORMATION:   Selected notes from the MEDICAL RECORD NUMBER Referred by Dr. Katy Fitch for concern of retinal tear OD LEE:  Ocular Hx- PMH-    CURRENT MEDICATIONS: No current outpatient medications on file. (Ophthalmic Drugs)   No current facility-administered medications for this visit. (Ophthalmic Drugs)   Current Outpatient Medications (Other)  Medication Sig   albuterol (VENTOLIN HFA) 108 (90 Base) MCG/ACT inhaler Inhale 2 puffs into  the lungs every 4 (four) hours as needed for wheezing or shortness of breath.   albuterol (VENTOLIN HFA) 108 (90 Base) MCG/ACT inhaler Inhale 2 puffs into the lungs every 4 (four) hours as needed.   aspirin EC 81 MG tablet Take 81 mg by mouth 2 (two) times daily. Swallow whole.   beclomethasone (QVAR) 40 MCG/ACT inhaler Inhale 2 puffs into the lungs daily as needed (Asthma).   clindamycin (CLINDAGEL) 1 % gel Apply 1 application topically daily.   EPINEPHrine 0.3 mg/0.3 mL IJ SOAJ injection Inject 0.3 mg into the muscle as needed for anaphylaxis.   EPINEPHrine 0.3 mg/0.3 mL IJ SOAJ injection Use as directed once as needed   ibuprofen (ADVIL) 200 MG tablet Take 200-400 mg by mouth 2 (two) times daily.   levothyroxine (SYNTHROID) 112 MCG tablet Take 1 tablet (112 mcg total) by mouth in the morning on an empty stomach.   meclizine (ANTIVERT) 25 MG tablet Take 25 mg by mouth daily as needed for dizziness.   omeprazole (PRILOSEC) 40 MG capsule Take 40 mg by mouth daily as needed (Heart burn).   omeprazole (PRILOSEC) 40 MG capsule Take 1 capsule (40 mg total) by mouth daily.   triamterene-hydrochlorothiazide (MAXZIDE) 75-50 MG tablet Take 0.5 tablets by mouth daily.   diclofenac (CATAFLAM) 50 MG tablet Take 1 tablet (50 mg total) by mouth 2 (two) times daily as needed with food or milk (Patient not taking: Reported on 01/17/2021)   levothyroxine (SYNTHROID) 100 MCG tablet Take 1 tablet (100 mcg total) by mouth in the  morning on an empty stomach daily (Patient not taking: Reported on 02/18/2022)   traMADol (ULTRAM) 50 MG tablet Take 1 tablet (50 mg total) by mouth every 6 (six) hours as needed. (Patient not taking: Reported on 02/18/2022)   triamterene-hydrochlorothiazide (MAXZIDE) 75-50 MG tablet TAKE 1 TABLET BY MOUTH EVERY MORNING (Patient not taking: Reported on 02/07/2021)   No current facility-administered medications for this visit. (Other)   REVIEW OF SYSTEMS: ROS   Positive for: Endocrine, Eyes,  Respiratory Negative for: Constitutional, Gastrointestinal, Neurological, Skin, Genitourinary, Musculoskeletal, HENT, Cardiovascular, Psychiatric, Allergic/Imm, Heme/Lymph Last edited by Jobe Marker, COT on 02/18/2022 12:51 PM.     ALLERGIES Allergies  Allergen Reactions   Shellfish Allergy Anaphylaxis and Swelling   Avocado Swelling    Lips   Codeine Other (See Comments)    Constipation   Latex Hives    Power gloves   Pineapple Swelling    lip   Penicillins Rash    Reaction: Childhood   PAST MEDICAL HISTORY Past Medical History:  Diagnosis Date   Asthma    Depression    Dyspnea    Hyperlipidemia    Hypertension    Hypothyroidism    Thyroid disease    Past Surgical History:  Procedure Laterality Date   FOOT SURGERY     STRABISMUS SURGERY     TOTAL ABDOMINAL HYSTERECTOMY     VEIN LIGATION AND STRIPPING Left 01/22/2021   Procedure: LEFT GREATER SAPHENOUS VEIN  LIGATION AND [place];  Surgeon: Elam Dutch, MD;  Location: [facility] OR;  Service: Vascular;  Laterality: Left;   FAMILY HISTORY Family History  Problem Relation Age of Onset   Heart failure Mother    Kidney failure Mother    SOCIAL HISTORY Social History   Tobacco Use   Smoking status: Never   Smokeless tobacco: Never  Vaping Use   Vaping Use: Never used  Substance Use Topics   Alcohol use: No   Drug use: No       OPHTHALMIC EXAM:  Base Eye Exam     Visual Acuity (Snellen - Linear)       Right Left   Dist cc 20/40 -2 20/30 -2   Dist ph cc 20/30 +1 20/25 -2    Correction: Glasses         Tonometry (Tonopen, 1:08 PM)       Right Left   Pressure 17 14         Pupils       Dark Light Shape React APD   Right 8 8 Round Minimal None   Left 8 8 Round Minimal None         Visual Fields (Counting fingers)       Left Right    Full Full         Extraocular Movement       Right Left    Full, Ortho Full, Ortho         Neuro/Psych     Oriented x3: Yes    Mood/Affect: Normal         Dilation     Both eyes: 1.0% Mydriacyl, 2.5% Phenylephrine @ 1:08 PM           Slit Lamp and Fundus Exam     Slit Lamp Exam       Right Left   Lids/Lashes Dermatochalasis - upper lid Dermatochalasis - upper lid   Conjunctiva/Sclera White and quiet White and quiet   Cornea trace PEE trace PEE  Anterior Chamber deep and clear deep and clear   Iris Round and dilated Round and dilated   Lens 2-3+ Nuclear sclerosis, 2-3+ Cortical cataract 2-3+ Nuclear sclerosis, 2-3+ Cortical cataract   Anterior Vitreous Vitreous syneresis; focal, punctate vitreous condensation at 0430 Vitreous syneresis         Fundus Exam       Right Left   Disc Pink and Sharp, mild temporal PPA Pink and Sharp, mild temporal PPA, mild tilt   C/D Ratio 0.5 0.4   Macula Flat, Good foveal reflex, mild RPE mottling, No heme or edema Flat, Good foveal reflex, mild RPE mottling, No heme or edema   Vessels mild attenuation mild attenuation   Periphery Attached, pigmented VR tuft at 0600 far periphery Attached, round pigmented CR scar vs CHRPE at 0430           Refraction     Wearing Rx       Sphere Cylinder Axis   Right -9.75 +0.75 040   Left -8.75 Sphere            IMAGING AND PROCEDURES  Imaging and Procedures for 02/18/2022  OCT, Retina - OU - Both Eyes       Right Eye Quality was good. Central Foveal Thickness: 225. Progression has no prior data. Findings include normal foveal contour, no IRF, no SRF, myopic contour, vitreomacular adhesion .   Left Eye Quality was good. Central Foveal Thickness: 227. Progression has no prior data. Findings include normal foveal contour, no IRF, no SRF, myopic contour, vitreomacular adhesion .   Notes *Images captured and stored on drive  Diagnosis / Impression:  NFP; no IRF/SRF OU Myopic contour OU +VMA OU  Clinical management:  See below  Abbreviations: NFP - Normal foveal profile. CME - cystoid macular edema. PED  - pigment epithelial detachment. IRF - intraretinal fluid. SRF - subretinal fluid. EZ - ellipsoid zone. ERM - epiretinal membrane. ORA - outer retinal atrophy. ORT - outer retinal tubulation. SRHM - subretinal hyper-reflective material. IRHM - intraretinal hyper-reflective material            ASSESSMENT/PLAN:    ICD-10-CM   1. 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 Essential hypertension  I10     4. Hypertensive retinopathy of both eyes  H35.033 OCT, Retina - OU - Both Eyes    5. Combined forms of age-related cataract of both eyes  H25.813      Pigmented VR tuft OD  - focal pigmented VR -- 0600 far periphery -- no retinal break on scleral depression  - discussed findings, prognosis, treatment options - no retinal or ophthalmic interventions indicated or recommended at this time  - recommend monitoring for now  - f/u 4-6 weeks, DFE, OCT  2. CHRPE OS  - focal round pigmented lesion at 0430  - monitor  3,4. Hypertensive retinopathy OU - discussed importance of tight BP control - monitor  5. Mixed Cataract OU - under the expert management of Dr. Zenia Resides - clear from a retina standpoint to proceed with cataract surgery when pt and surgeon are ready    Ophthalmic Meds Ordered this visit:  No orders of the defined types were placed in this encounter.    Return for f/u 4-6 weeks, VR tuft OD, DFE, OCT.  There are no Patient Instructions on file for  this visit.   Explained the diagnoses, plan, and follow up with the patient and they expressed understanding.  Patient expressed understanding of the importance of proper follow up care.   Gardiner Sleeper, M.D., Ph.D. Diseases & Surgery of the Retina and Vitreous [facility]  I have reviewed the above documentation for accuracy and completeness, and I agree with the above. Gardiner Sleeper,  M.D., Ph.D. 02/19/22 1:52 AM  Abbreviations: M myopia (nearsighted); A astigmatism; H hyperopia (farsighted); P presbyopia; Mrx spectacle prescription;  CTL contact lenses; OD right eye; OS left eye; OU both eyes  XT exotropia; ET esotropia; PEK punctate epithelial keratitis; PEE punctate epithelial erosions; DES dry eye syndrome; MGD meibomian gland dysfunction; ATs artificial tears; PFAT's preservative free artificial tears; [place] nuclear sclerotic cataract; PSC posterior subcapsular cataract; ERM epi-retinal membrane; PVD posterior vitreous detachment; RD retinal detachment; DM diabetes mellitus; DR diabetic retinopathy; NPDR non-proliferative diabetic retinopathy; PDR proliferative diabetic retinopathy; CSME clinically significant macular edema; DME diabetic macular edema; dbh dot blot hemorrhages; CWS cotton wool spot; POAG primary open angle glaucoma; C/D cup-to-disc ratio; HVF humphrey visual field; GVF goldmann visual field; OCT optical coherence tomography; IOP intraocular pressure; BRVO Branch retinal vein occlusion; CRVO central retinal vein occlusion; CRAO central retinal artery occlusion; BRAO branch retinal artery occlusion; RT retinal tear; SB scleral buckle; PPV pars plana vitrectomy; VH Vitreous hemorrhage; PRP panretinal laser photocoagulation; IVK intravitreal kenalog; VMT vitreomacular traction; MH Macular hole;  NVD neovascularization of the disc; NVE neovascularization elsewhere; AREDS age related eye disease study; ARMD age related macular degeneration; POAG primary open angle glaucoma; EBMD epithelial/anterior basement membrane dystrophy; ACIOL anterior chamber intraocular lens; IOL intraocular lens; PCIOL posterior chamber intraocular lens; Phaco/IOL phacoemulsification with intraocular lens placement; [place] photorefractive keratectomy; LASIK laser assisted in situ keratomileusis; HTN hypertension; DM diabetes mellitus; COPD chronic obstructive pulmonary disease

## 2022-02-19 ENCOUNTER — Encounter (INDEPENDENT_AMBULATORY_CARE_PROVIDER_SITE_OTHER): Payer: Self-pay | Admitting: Ophthalmology

## 2022-02-25 ENCOUNTER — Other Ambulatory Visit (HOSPITAL_COMMUNITY): Payer: Self-pay

## 2022-03-25 ENCOUNTER — Other Ambulatory Visit (HOSPITAL_COMMUNITY): Payer: Self-pay

## 2022-03-25 MED ORDER — BENZONATATE 200 MG PO CAPS
200.0000 mg | ORAL_CAPSULE | Freq: Every day | ORAL | 0 refills | Status: DC
  Filled 2022-03-25: qty 5, 5d supply, fill #0

## 2022-03-25 MED ORDER — DOXYCYCLINE HYCLATE 100 MG PO TABS
100.0000 mg | ORAL_TABLET | Freq: Two times a day (BID) | ORAL | 0 refills | Status: DC
  Filled 2022-03-25: qty 20, 10d supply, fill #0

## 2022-04-01 ENCOUNTER — Encounter (INDEPENDENT_AMBULATORY_CARE_PROVIDER_SITE_OTHER): Payer: No Typology Code available for payment source | Admitting: Ophthalmology

## 2022-04-01 ENCOUNTER — Encounter (INDEPENDENT_AMBULATORY_CARE_PROVIDER_SITE_OTHER): Payer: Self-pay

## 2022-04-01 DIAGNOSIS — I10 Essential (primary) hypertension: Secondary | ICD-10-CM

## 2022-04-01 DIAGNOSIS — Q141 Congenital malformation of retina: Secondary | ICD-10-CM

## 2022-04-01 DIAGNOSIS — H35033 Hypertensive retinopathy, bilateral: Secondary | ICD-10-CM

## 2022-04-01 DIAGNOSIS — H25813 Combined forms of age-related cataract, bilateral: Secondary | ICD-10-CM

## 2022-04-03 ENCOUNTER — Other Ambulatory Visit: Payer: Self-pay | Admitting: Endocrinology

## 2022-04-03 DIAGNOSIS — E049 Nontoxic goiter, unspecified: Secondary | ICD-10-CM

## 2022-04-09 ENCOUNTER — Other Ambulatory Visit (HOSPITAL_COMMUNITY): Payer: Self-pay

## 2022-04-09 MED ORDER — BUDESONIDE-FORMOTEROL FUMARATE 160-4.5 MCG/ACT IN AERO
2.0000 | INHALATION_SPRAY | Freq: Two times a day (BID) | RESPIRATORY_TRACT | 0 refills | Status: DC
  Filled 2022-04-09: qty 10.2, 30d supply, fill #0

## 2022-04-25 ENCOUNTER — Ambulatory Visit
Admission: RE | Admit: 2022-04-25 | Discharge: 2022-04-25 | Disposition: A | Discharge status: Home / Self Care | Payer: No Typology Code available for payment source | Source: Ambulatory Visit | Attending: Endocrinology | Admitting: Endocrinology

## 2022-04-25 DIAGNOSIS — E049 Nontoxic goiter, unspecified: Secondary | ICD-10-CM

## 2022-04-30 ENCOUNTER — Telehealth: Payer: Self-pay

## 2022-04-30 ENCOUNTER — Other Ambulatory Visit: Payer: Self-pay

## 2022-04-30 DIAGNOSIS — I83812 Varicose veins of left lower extremities with pain: Secondary | ICD-10-CM

## 2022-04-30 DIAGNOSIS — M79605 Pain in left leg: Secondary | ICD-10-CM

## 2022-04-30 NOTE — Telephone Encounter (Signed)
Pt called with c/o RLE VV pain. She had a LLE laser ablation last year and feels she has started having the same similar symptoms she previously experienced in the LLE, now in the RLE. She has been scheduled for a reflux study of RLE and to see APP next week.

## 2022-05-02 ENCOUNTER — Other Ambulatory Visit: Payer: Self-pay

## 2022-05-02 NOTE — Progress Notes (Unsigned)
Requested by:  Marda Stalker, PA-C [place],  [place] [address]   History of Present Illness   Evelyn Baker is a 61 y.o. (October 07, 1960) female who presents for evaluation of painful varicose veins in RLE. She previously has had ligation and stripping of her left greater saphenous vein by Dr. Oneida Alar in July of 2022. She now feels that her right leg is getting similar sensations that she had on the left. She explains that she feels some numbness along the lateral anterior right lower leg, and also a tingling feeling like something is crawling on her leg. She denies any aching, throbbing, itching, burning, bleeding or ulceration. She has not had very much swelling recently. She wears compression stockings daily. She admits to no elevating as much as she has been instructed to in the past. She works from home for [employer] so she does sit a lot of the day. She knows she should probably take more breaks than she does. She reports no issues with her LLE since her ablation.  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 Aggravating factors: sitting, standing Alleviating factors: compression  Compression:  yes Helps:  yes Pain medications:  no Previous vein procedures:  ligation and stripping of left greater saphenous vein History of DVT:  No  Past Medical History:  Diagnosis Date   Asthma    Depression    Dyspnea    Hyperlipidemia    Hypertension    Hypothyroidism    Thyroid disease     Past Surgical History:  Procedure Laterality Date   FOOT SURGERY     STRABISMUS SURGERY     TOTAL ABDOMINAL HYSTERECTOMY     VEIN LIGATION AND STRIPPING Left 01/22/2021   Procedure: LEFT GREATER SAPHENOUS VEIN  LIGATION AND [place];  Surgeon: Elam Dutch, MD;  Location: [facility] OR;  Service: Vascular;  Laterality: Left;    Social History   Socioeconomic History   Marital status: Married    Spouse name:  Not on file   Number of children: Not on file   Years of education: Not on file   Highest education level: Not on file  Occupational History   Not on file  Tobacco Use   Smoking status: Never    Passive exposure: Never   Smokeless tobacco: Never  Vaping Use   Vaping Use: Never used  Substance and Sexual Activity   Alcohol use: No   Drug use: No   Sexual activity: Yes    Birth control/protection: Surgical    Comment: partial hysterectomy  Other Topics Concern   Not on file  Social History Narrative   Not on file   Social Determinants of Health   Financial Resource Strain: Not on file  Food Insecurity: Not on file  Transportation Needs: Not on file  Physical Activity: Not on file  Stress: Not on file  Social Connections: Not on file  Intimate Partner Violence: Not on file    Family History  Problem Relation Age of Onset   Heart failure Mother    Kidney failure Mother     Current Outpatient Medications  Medication Sig Dispense Refill   albuterol (VENTOLIN HFA) 108 (90 Base) MCG/ACT inhaler Inhale 2 puffs into the lungs every 4 (four) hours as needed for wheezing or shortness of breath.     albuterol (VENTOLIN HFA) 108 (90 Base) MCG/ACT inhaler Inhale 2 puffs into the lungs every 4 (  four) hours as needed. 8.5 g 2   aspirin EC 81 MG tablet Take 81 mg by mouth 2 (two) times daily. Swallow whole.     beclomethasone (QVAR) 40 MCG/ACT inhaler Inhale 2 puffs into the lungs daily as needed (Asthma).     benzonatate (TESSALON) 200 MG capsule Take 1 capsule (200 mg total) by mouth at bedtime for 5 days 5 capsule 0   budesonide-formoterol (SYMBICORT) 160-4.5 MCG/ACT inhaler Inhale 2 puffs into the lungs 2 (two) times daily. 10.2 g 0   clindamycin (CLINDAGEL) 1 % gel Apply 1 application topically daily.     diclofenac (CATAFLAM) 50 MG tablet Take 1 tablet (50 mg total) by mouth 2 (two) times daily as needed with food or milk 60 tablet 1   doxycycline (VIBRA-TABS) 100 MG tablet Take  1 tablet (100 mg total) by mouth 2 (two) times daily fro 10 days 20 tablet 0   EPINEPHrine 0.3 mg/0.3 mL IJ SOAJ injection Inject 0.3 mg into the muscle as needed for anaphylaxis.     EPINEPHrine 0.3 mg/0.3 mL IJ SOAJ injection Use as directed once as needed 2 each 1   ibuprofen (ADVIL) 200 MG tablet Take 200-400 mg by mouth 2 (two) times daily.     levothyroxine (SYNTHROID) 112 MCG tablet Take 1 tablet (112 mcg total) by mouth in the morning on an empty stomach. 30 tablet 6   meclizine (ANTIVERT) 25 MG tablet Take 25 mg by mouth daily as needed for dizziness.     omeprazole (PRILOSEC) 40 MG capsule Take 1 capsule (40 mg total) by mouth daily. 90 capsule 1   triamterene-hydrochlorothiazide (MAXZIDE) 75-50 MG tablet Take 0.5 tablets by mouth daily. 45 tablet 3   traMADol (ULTRAM) 50 MG tablet Take 1 tablet (50 mg total) by mouth every 6 (six) hours as needed. (Patient not taking: Reported on 02/18/2022) 20 tablet 0   No current facility-administered medications for this visit.    Allergies  Allergen Reactions   Shellfish Allergy Anaphylaxis and Swelling   Avocado Swelling    Lips   Codeine Other (See Comments)    Constipation   Iodinated Contrast Media Other (See Comments)   Latex Hives    Power gloves   Pineapple Swelling    lip   Penicillins Rash    Reaction: Childhood    REVIEW OF SYSTEMS (negative unless checked):   Cardiac:  '[]'$  Chest pain or chest pressure? '[]'$  Shortness of breath upon activity? '[]'$  Shortness of breath when lying flat? '[]'$  Irregular heart rhythm?  Vascular:  '[]'$  Pain in calf, thigh, or hip brought on by walking? '[]'$  Pain in feet at night that wakes you up from your sleep? '[]'$  Blood clot in your veins? '[]'$  Leg swelling?  Pulmonary:  '[]'$  Oxygen at home? '[]'$  Productive cough? '[]'$  Wheezing?  Neurologic:  '[]'$  Sudden weakness in arms or legs? '[]'$  Sudden numbness in arms or legs? '[]'$  Sudden onset of difficult speaking or slurred speech? '[]'$  Temporary loss of vision  in one eye? '[]'$  Problems with dizziness?  Gastrointestinal:  '[]'$  Blood in stool? '[]'$  Vomited blood?  Genitourinary:  '[]'$  Burning when urinating? '[]'$  Blood in urine?  Psychiatric:  '[]'$  Major depression  Hematologic:  '[]'$  Bleeding problems? '[]'$  Problems with blood clotting?  Dermatologic:  '[]'$  Rashes or ulcers?  Constitutional:  '[]'$  Fever or chills?  Ear/Nose/Throat:  '[]'$  Change in hearing? '[]'$  Nose bleeds? '[]'$  Sore throat?  Musculoskeletal:  '[]'$  Back pain? '[]'$  Joint pain? '[]'$  Muscle pain?  Physical Examination     Vitals:   05/05/22 1343  BP: 135/87  Pulse: 92  Resp: 20  Temp: 98.6 F (37 C)  TempSrc: Temporal  SpO2: 97%  Weight: 195 lb 9.6 oz (88.7 kg)  Height: '5\' 5"'$  (1.651 m)   Body mass index is 32.55 kg/m.  General:  WDWN in NAD; vital signs documented above Gait: Normal HENT: WNL, normocephalic Pulmonary: normal non-labored breathing Cardiac: regular HR Vascular Exam/Pulses:2+ popliteal, 2+ Dp and PT pulses bilaterally. Feet warm and well perfused Extremities: without varicose veins, without reticular veins, without edema, without stasis pigmentation, without lipodermatosclerosis, without ulcers Musculoskeletal: no muscle wasting or atrophy  Neurologic: A&O X 3;  No focal weakness or paresthesias are detected Psychiatric:  The pt has Normal affect.  Non-invasive Vascular Imaging   BLE Venous Insufficiency Duplex (05/05/22):  RLE:  No DVT and SVT No GSV reflux  GSV diameter 0.08 -0.42 cm SSV reflux in proximal calf No deep venous reflux AASV with reflux   Medical Decision Making   AARIANNA HOADLEY is a 61 y.o. female who presents with: RLE chronic venous insufficiency with some numbness and tingling in right leg. Her duplex today shows no DVT or SVT. No GSV reflux. She does have some reflux in SSV and AASV. Based on these findings she is not a candidate for venous ablation or other venous interventions. Based on the patient's history and examination, I  recommend: continued use of her knee high or thigh high compression stockings, daily elevation above level of heart, exercise, refraining from prolonged sitting or standing. Provided reassurance that she is not at risk for DVT or limb loss She will follow up as needed if any new or concerning symptoms    Karoline Caldwell, PA-C [facility]: [phone]  05/05/2022, 2:19 PM  Clinic MD: Trula Slade

## 2022-05-05 ENCOUNTER — Ambulatory Visit (INDEPENDENT_AMBULATORY_CARE_PROVIDER_SITE_OTHER): Payer: No Typology Code available for payment source | Admitting: Physician Assistant

## 2022-05-05 ENCOUNTER — Encounter: Payer: Self-pay | Admitting: Physician Assistant

## 2022-05-05 ENCOUNTER — Ambulatory Visit (HOSPITAL_COMMUNITY)
Admission: RE | Admit: 2022-05-05 | Discharge: 2022-05-05 | Disposition: A | Discharge status: Home / Self Care | Payer: No Typology Code available for payment source | Source: Ambulatory Visit | Attending: Surgery | Admitting: Surgery

## 2022-05-05 VITALS — BP 135/87 | HR 92 | Temp 98.6°F | Resp 20 | Ht 65.0 in | Wt 195.6 lb

## 2022-05-05 DIAGNOSIS — N6019 Diffuse cystic mastopathy of unspecified breast: Secondary | ICD-10-CM | POA: Insufficient documentation

## 2022-05-05 DIAGNOSIS — F329 Major depressive disorder, single episode, unspecified: Secondary | ICD-10-CM | POA: Insufficient documentation

## 2022-05-05 DIAGNOSIS — M79604 Pain in right leg: Secondary | ICD-10-CM | POA: Diagnosis not present

## 2022-05-05 DIAGNOSIS — K5909 Other constipation: Secondary | ICD-10-CM | POA: Insufficient documentation

## 2022-05-05 DIAGNOSIS — M7989 Other specified soft tissue disorders: Secondary | ICD-10-CM | POA: Diagnosis not present

## 2022-05-05 DIAGNOSIS — I83812 Varicose veins of left lower extremities with pain: Secondary | ICD-10-CM | POA: Diagnosis present

## 2022-05-05 DIAGNOSIS — I83811 Varicose veins of right lower extremities with pain: Secondary | ICD-10-CM

## 2022-05-05 DIAGNOSIS — N941 Unspecified dyspareunia: Secondary | ICD-10-CM | POA: Insufficient documentation

## 2022-05-05 DIAGNOSIS — M79605 Pain in left leg: Secondary | ICD-10-CM | POA: Insufficient documentation

## 2022-05-05 DIAGNOSIS — R8290 Unspecified abnormal findings in urine: Secondary | ICD-10-CM | POA: Insufficient documentation

## 2022-05-05 DIAGNOSIS — E669 Obesity, unspecified: Secondary | ICD-10-CM | POA: Insufficient documentation

## 2022-05-25 ENCOUNTER — Encounter (HOSPITAL_COMMUNITY): Payer: Self-pay | Admitting: Emergency Medicine

## 2022-05-25 ENCOUNTER — Ambulatory Visit (HOSPITAL_COMMUNITY)
Admission: EM | Admit: 2022-05-25 | Discharge: 2022-05-25 | Disposition: A | Discharge status: Home / Self Care | Payer: No Typology Code available for payment source | Attending: Emergency Medicine | Admitting: Emergency Medicine

## 2022-05-25 DIAGNOSIS — M79605 Pain in left leg: Secondary | ICD-10-CM

## 2022-05-25 MED ORDER — DOXYCYCLINE HYCLATE 100 MG PO TABS: 100.0000 mg | ORAL_TABLET | Freq: Two times a day (BID) | ORAL | 0 refills | Status: DC

## 2022-05-25 NOTE — Discharge Instructions (Signed)
Contact Ms. Wharton's office tomorrow to arrange follow up.   If you get short of breath or have chest pain, please call [phone]/go to the ER.   [facility] should call  you tomorrow to arrange your ultrasound of your left lower leg.

## 2022-05-25 NOTE — ED Provider Notes (Signed)
Evelyn Baker    CSN: [id number] Arrival date & time: 05/25/22  1148      History   Chief Complaint Chief Complaint  Patient presents with   Leg Pain    HPI Evelyn Baker is a 61 y.o. female. Pt reports LLE pain, swelling, redness, and warmth that began about 9-10 days ago. Started with a small lump on her medial/anterior left lower leg area that has progressively worsened. Did fly a week ago to [place], wore compression socks on flights. Symptoms have progressively worsen over time. Has tried ice and heat with no relief in symptoms. Area aches and throbs. Denies hx of DVT/PE. Denies any cuts/abrasions to LLE, does not shave legs.    Leg Pain   Past Medical History:  Diagnosis Date   Asthma    Depression    Dyspnea    Hyperlipidemia    Hypertension    Hypothyroidism    Thyroid disease     Patient Active Problem List   Diagnosis Date Noted   Dyspareunia in female 05/05/2022   Abnormal urine 05/05/2022   Chronic constipation 05/05/2022   Fibrocystic disease of breast 05/05/2022   Obesity 05/05/2022   Reactive depression (situational) 05/05/2022   Chronic venous insufficiency 02/13/2020   Leg edema, left 02/13/2020    Past Surgical History:  Procedure Laterality Date   FOOT SURGERY     STRABISMUS SURGERY     TOTAL ABDOMINAL HYSTERECTOMY     VEIN LIGATION AND STRIPPING Left 01/22/2021   Procedure: LEFT GREATER SAPHENOUS VEIN  LIGATION AND [place];  Surgeon: Elam Dutch, MD;  Location: [facility] OR;  Service: Vascular;  Laterality: Left;    OB History     Gravida  5   Para  4   Term      Preterm      AB      Living  4      SAB      IAB      Ectopic      Multiple      Live Births               Home Medications    Prior to Admission medications   Medication Sig Start Date End Date Taking? Authorizing Provider  albuterol (VENTOLIN HFA) 108 (90 Base) MCG/ACT inhaler Inhale 2 puffs into the lungs every 4 (four) hours as  needed for wheezing or shortness of breath. 01/22/21   Dagoberto Ligas, PA-C  albuterol (VENTOLIN HFA) 108 (90 Base) MCG/ACT inhaler Inhale 2 puffs into the lungs every 4 (four) hours as needed. 10/11/21     aspirin EC 81 MG tablet Take 81 mg by mouth 2 (two) times daily. Swallow whole.    [provider]  beclomethasone (QVAR) 40 MCG/ACT inhaler Inhale 2 puffs into the lungs daily as needed (Asthma).    [provider]  benzonatate (TESSALON) 200 MG capsule Take 1 capsule (200 mg total) by mouth at bedtime for 5 days 03/24/22     budesonide-formoterol (SYMBICORT) 160-4.5 MCG/ACT inhaler Inhale 2 puffs into the lungs 2 (two) times daily. 04/09/22     clindamycin (CLINDAGEL) 1 % gel Apply 1 application topically daily.    [provider]  diclofenac (CATAFLAM) 50 MG tablet Take 1 tablet (50 mg total) by mouth 2 (two) times daily as needed with food or milk 12/27/20     doxycycline (VIBRA-TABS) 100 MG tablet Take 1 tablet (100 mg total) by mouth 2 (two) times daily  fro 10 days 05/25/22   Carvel Getting, NP  EPINEPHrine 0.3 mg/0.3 mL IJ SOAJ injection Inject 0.3 mg into the muscle as needed for anaphylaxis.    [provider]  EPINEPHrine 0.3 mg/0.3 mL IJ SOAJ injection Use as directed once as needed 01/21/22     ibuprofen (ADVIL) 200 MG tablet Take 200-400 mg by mouth 2 (two) times daily.    [provider]  levothyroxine (SYNTHROID) 112 MCG tablet Take 1 tablet (112 mcg total) by mouth in the morning on an empty stomach. 11/01/21     meclizine (ANTIVERT) 25 MG tablet Take 25 mg by mouth daily as needed for dizziness.    [provider]  omeprazole (PRILOSEC) 40 MG capsule Take 1 capsule (40 mg total) by mouth daily. 10/11/21     traMADol (ULTRAM) 50 MG tablet Take 1 tablet (50 mg total) by mouth every 6 (six) hours as needed. Patient not taking: Reported on 02/18/2022 01/22/21   Dagoberto Ligas, PA-C  triamterene-hydrochlorothiazide (MAXZIDE) 75-50 MG  tablet Take 0.5 tablets by mouth daily. 03/15/21       Family History Family History  Problem Relation Age of Onset   Heart failure Mother    Kidney failure Mother     Social History Social History   Tobacco Use   Smoking status: Never    Passive exposure: Never   Smokeless tobacco: Never  Vaping Use   Vaping Use: Never used  Substance Use Topics   Alcohol use: No   Drug use: No     Allergies   Shellfish allergy, Avocado, Codeine, Iodinated contrast media, Latex, Pineapple, and Penicillins   Review of Systems Review of Systems   Physical Exam Triage Vital Signs ED Triage Vitals  Enc Vitals Group     BP 05/25/22 1305 (!) 128/94     Pulse Rate 05/25/22 1305 (!) 102     Resp 05/25/22 1305 18     Temp 05/25/22 1305 98 F (36.7 C)     Temp Source 05/25/22 1305 Oral     SpO2 05/25/22 1305 96 %     Weight --      Height --      Head Circumference --      Peak Flow --      Pain Score 05/25/22 1302 10     Pain Loc --      Pain Edu? --      Excl. in [place]? --    No data found.  Updated Vital Signs BP (!) 128/94 (BP Location: Left Arm)   Pulse (!) 102   Temp 98 F (36.7 C) (Oral)   Resp 18   SpO2 96%   Visual Acuity Right Eye Distance:   Left Eye Distance:   Bilateral Distance:    Right Eye Near:   Left Eye Near:    Bilateral Near:     Physical Exam Constitutional:      Appearance: Normal appearance.  Pulmonary:     Effort: Pulmonary effort is normal.  Musculoskeletal:     Left lower leg: Swelling and tenderness present. No bony tenderness. Edema present.       Legs:     Comments: Negative homan's sign LLE. Medial/anterior LLE warm to the touch, erythematous, painful to the touch, and swollen.   Neurological:     Mental Status: She is alert.      UC Treatments / Results  Labs (all labs ordered are listed, but only abnormal results are displayed) Labs  Reviewed - No data to display  EKG   Radiology No results  found.  Procedures Procedures (including critical care time)  Medications Ordered in UC Medications - No data to display  Initial Impression / Assessment and Plan / UC Course  I have reviewed the triage vital signs and the nursing notes.  Pertinent labs & imaging results that were available during my care of the patient were reviewed by me and considered in my medical decision making (see chart for details).    Possibilities include DVT or cellulitis although location is unusual for DVT. I cannot get outpatient LE ultrasound on Sundays. Discussed options with patient including risks. Pt elects to start on antibiotics, monitor self carefully for sx PE, and get outpatient [place] tomorrow instead of going to ED today. Strict ED precautions reviewed.   Final Clinical Impressions(s) / UC Diagnoses   Final diagnoses:  Left leg pain     Discharge Instructions      Contact Ms. Wharton's office tomorrow to arrange follow up.   If you get short of breath or have chest pain, please call 911/go to the ER.   [facility] should call  you tomorrow to arrange your ultrasound of your left lower leg.    ED Prescriptions     Medication Sig Dispense Auth. Provider   doxycycline (VIBRA-TABS) 100 MG tablet Take 1 tablet (100 mg total) by mouth 2 (two) times daily fro 10 days 20 tablet Daisi Kentner, Dionne Bucy, NP      PDMP not reviewed this encounter.   Carvel Getting, NP 05/25/22 [phone]

## 2022-05-25 NOTE — ED Triage Notes (Signed)
Pt reports left leg pain. States she developed a "lump" on her leg last Friday. States have tried ice and heat and very tender to touch.

## 2022-05-26 ENCOUNTER — Ambulatory Visit (HOSPITAL_COMMUNITY)
Admission: RE | Admit: 2022-05-26 | Discharge: 2022-05-26 | Disposition: A | Discharge status: Home / Self Care | Payer: No Typology Code available for payment source | Source: Ambulatory Visit | Attending: Family Medicine | Admitting: Family Medicine

## 2022-05-26 DIAGNOSIS — M79605 Pain in left leg: Secondary | ICD-10-CM | POA: Diagnosis present

## 2022-05-26 NOTE — Progress Notes (Signed)
Lower extremity venous has been completed.   Preliminary results in CV Proc.   Evelyn Baker 05/26/2022 11:36 AM

## 2022-05-27 ENCOUNTER — Other Ambulatory Visit (HOSPITAL_COMMUNITY): Payer: Self-pay

## 2022-05-27 MED ORDER — LEVOTHYROXINE SODIUM 112 MCG PO TABS
112.0000 ug | ORAL_TABLET | Freq: Every morning | ORAL | 6 refills | Status: DC
  Filled 2022-05-27: qty 90, 90d supply, fill #0
  Filled 2022-08-25: qty 90, 90d supply, fill #1

## 2022-05-28 ENCOUNTER — Other Ambulatory Visit (HOSPITAL_COMMUNITY): Payer: Self-pay

## 2022-05-28 MED ORDER — TRIAMTERENE-HCTZ 75-50 MG PO TABS
0.5000 | ORAL_TABLET | Freq: Every day | ORAL | 3 refills | Status: DC
  Filled 2022-05-28: qty 45, 90d supply, fill #0
  Filled 2022-10-13: qty 45, 90d supply, fill #1
  Filled 2023-01-02: qty 45, 90d supply, fill #2

## 2022-06-04 ENCOUNTER — Telehealth: Payer: Self-pay

## 2022-06-04 DIAGNOSIS — M79605 Pain in left leg: Secondary | ICD-10-CM

## 2022-06-04 DIAGNOSIS — I83812 Varicose veins of left lower extremities with pain: Secondary | ICD-10-CM

## 2022-06-04 DIAGNOSIS — M7989 Other specified soft tissue disorders: Secondary | ICD-10-CM

## 2022-06-04 NOTE — Telephone Encounter (Signed)
Pt called stating that she was having L leg pain, possibly varicose veins, and requested an appt.  Reviewed pt's chart, returned call for clarification, two identifiers used. Pt describes pain, swelling, and redness on her LLE. She has been elevating and wearing compressions. She recently had DVT study which was negative. Encourage to continue compressions and elevate properly. Appts made for [place] and [place]. Confirmed understanding.

## 2022-06-04 NOTE — Progress Notes (Deleted)
Office Note  History of Present Illness   Evelyn Baker is a 61 y.o. (Nov 27, 1960) female who presents for evaluation of ***  Venous symptoms include: (aching, heavy, tired, throbbing, burning, itching, swelling, bleeding, ulcer)  *** Onset/duration:  ***  Occupation:  *** Aggravating factors: (sitting, standing) Alleviating factors: (elevation) Compression:  *** Helps:  *** Pain medications:  *** Previous vein procedures:  *** History of DVT:  ***  Past Medical History:  Diagnosis Date   Asthma    Depression    Dyspnea    Hyperlipidemia    Hypertension    Hypothyroidism    Thyroid disease     Past Surgical History:  Procedure Laterality Date   FOOT SURGERY     STRABISMUS SURGERY     TOTAL ABDOMINAL HYSTERECTOMY     VEIN LIGATION AND STRIPPING Left 01/22/2021   Procedure: LEFT GREATER SAPHENOUS VEIN  LIGATION AND [place];  Surgeon: Elam Dutch, MD;  Location: [facility] OR;  Service: Vascular;  Laterality: Left;    Social History   Socioeconomic History   Marital status: Married    Spouse name: Not on file   Number of children: Not on file   Years of education: Not on file   Highest education level: Not on file  Occupational History   Not on file  Tobacco Use   Smoking status: Never    Passive exposure: Never   Smokeless tobacco: Never  Vaping Use   Vaping Use: Never used  Substance and Sexual Activity   Alcohol use: No   Drug use: No   Sexual activity: Yes    Birth control/protection: Surgical    Comment: partial hysterectomy  Other Topics Concern   Not on file  Social History Narrative   Not on file   Social Determinants of Health   Financial Resource Strain: Not on file  Food Insecurity: Not on file  Transportation Needs: Not on file  Physical Activity: Not on file  Stress: Not on file  Social Connections: Not on file  Intimate Partner Violence: Not on file   *** Family History  Problem Relation Age of Onset   Heart failure  Mother    Kidney failure Mother     Current Outpatient Medications  Medication Sig Dispense Refill   albuterol (VENTOLIN HFA) 108 (90 Base) MCG/ACT inhaler Inhale 2 puffs into the lungs every 4 (four) hours as needed for wheezing or shortness of breath.     albuterol (VENTOLIN HFA) 108 (90 Base) MCG/ACT inhaler Inhale 2 puffs into the lungs every 4 (four) hours as needed. 8.5 g 2   aspirin EC 81 MG tablet Take 81 mg by mouth 2 (two) times daily. Swallow whole.     beclomethasone (QVAR) 40 MCG/ACT inhaler Inhale 2 puffs into the lungs daily as needed (Asthma).     benzonatate (TESSALON) 200 MG capsule Take 1 capsule (200 mg total) by mouth at bedtime for 5 days 5 capsule 0   budesonide-formoterol (SYMBICORT) 160-4.5 MCG/ACT inhaler Inhale 2 puffs into the lungs 2 (two) times daily. 10.2 g 0   clindamycin (CLINDAGEL) 1 % gel Apply 1 application topically daily.     diclofenac (CATAFLAM) 50 MG tablet Take 1 tablet (50 mg total) by mouth 2 (two) times daily as needed with food or milk 60 tablet 1   doxycycline (VIBRA-TABS) 100 MG tablet Take 1 tablet (100 mg total) by mouth 2 (two) times daily fro 10 days 20  tablet 0   EPINEPHrine 0.3 mg/0.3 mL IJ SOAJ injection Inject 0.3 mg into the muscle as needed for anaphylaxis.     EPINEPHrine 0.3 mg/0.3 mL IJ SOAJ injection Use as directed once as needed 2 each 1   ibuprofen (ADVIL) 200 MG tablet Take 200-400 mg by mouth 2 (two) times daily.     levothyroxine (SYNTHROID) 112 MCG tablet Take 1 tablet (112 mcg total) by mouth every morning on an empty stomach 30 tablet 6   meclizine (ANTIVERT) 25 MG tablet Take 25 mg by mouth daily as needed for dizziness.     omeprazole (PRILOSEC) 40 MG capsule Take 1 capsule (40 mg total) by mouth daily. 90 capsule 1   traMADol (ULTRAM) 50 MG tablet Take 1 tablet (50 mg total) by mouth every 6 (six) hours as needed. (Patient not taking: Reported on 02/18/2022) 20 tablet 0   triamterene-hydrochlorothiazide (MAXZIDE) 75-50 MG  tablet Take 0.5 tablets by mouth daily. 90 tablet 3   No current facility-administered medications for this visit.    Allergies  Allergen Reactions   Shellfish Allergy Anaphylaxis and Swelling   Avocado Swelling    Lips   Codeine Other (See Comments)    Constipation   Iodinated Contrast Media Other (See Comments)   Latex Hives    Power gloves   Pineapple Swelling    lip   Penicillins Rash    Reaction: Childhood    ***REVIEW OF SYSTEMS (negative unless checked):   Cardiac:  '[]'$  Chest pain or chest pressure? '[]'$  Shortness of breath upon activity? '[]'$  Shortness of breath when lying flat? '[]'$  Irregular heart rhythm?  Vascular:  '[]'$  Pain in calf, thigh, or hip brought on by walking? '[]'$  Pain in feet at night that wakes you up from your sleep? '[]'$  Blood clot in your veins? '[]'$  Leg swelling?  Pulmonary:  '[]'$  Oxygen at home? '[]'$  Productive cough? '[]'$  Wheezing?  Neurologic:  '[]'$  Sudden weakness in arms or legs? '[]'$  Sudden numbness in arms or legs? '[]'$  Sudden onset of difficult speaking or slurred speech? '[]'$  Temporary loss of vision in one eye? '[]'$  Problems with dizziness?  Gastrointestinal:  '[]'$  Blood in stool? '[]'$  Vomited blood?  Genitourinary:  '[]'$  Burning when urinating? '[]'$  Blood in urine?  Psychiatric:  '[]'$  Major depression  Hematologic:  '[]'$  Bleeding problems? '[]'$  Problems with blood clotting?  Dermatologic:  '[]'$  Rashes or ulcers?  Constitutional:  '[]'$  Fever or chills?  Ear/Nose/Throat:  '[]'$  Change in hearing? '[]'$  Nose bleeds? '[]'$  Sore throat?  Musculoskeletal:  '[]'$  Back pain? '[]'$  Joint pain? '[]'$  Muscle pain?   Physical Examination    There were no vitals filed for this visit. There is no height or weight on file to calculate BMI.  General:  WDWN in NAD; vital signs documented above Gait: Not observed HENT: WNL, normocephalic Pulmonary: normal non-labored breathing , without Rales, rhonchi,  wheezing Cardiac: {Desc; regular/irreg:14544} HR, without  Murmurs  {With/Without:20273} carotid bruit*** Abdomen: soft, NT, no masses Skin: {With/Without:20273} rashes Vascular Exam/Pulses:  Right Left  Radial {Exam; arterial pulse strength 0-4:30167} {Exam; arterial pulse strength 0-4:30167}  Ulnar {Exam; arterial pulse strength 0-4:30167} {Exam; arterial pulse strength 0-4:30167}  Femoral {Exam; arterial pulse strength 0-4:30167} {Exam; arterial pulse strength 0-4:30167}  Popliteal {Exam; arterial pulse strength 0-4:30167} {Exam; arterial pulse strength 0-4:30167}  DP {Exam; arterial pulse strength 0-4:30167} {Exam; arterial pulse strength 0-4:30167}  PT {Exam; arterial pulse strength 0-4:30167} {Exam; arterial pulse strength 0-4:30167}   Extremities: {With/Without:20273} varicose veins, {With/Without:20273} reticular veins, {With/Without:20273}  edema, {With/Without:20273} stasis pigmentation, {With/Without:20273} lipodermatosclerosis, {With/Without:20273} ulcers Musculoskeletal: no muscle wasting or atrophy  Neurologic: A&O X 3;  No focal weakness or paresthesias are detected Psychiatric:  The pt has {Desc; normal/abnormal:11317::"Normal"} affect.  Non-invasive Vascular Imaging   BLE Venous Insufficiency Duplex (***):  RLE:  *** DVT and SVT,  *** GSV reflux ***, GSV diameter *** *** SSV reflux ***, *** deep venous reflux  LLE: *** DVT and SVT,  *** GSV reflux ***,  GSV diameter *** *** SSV reflux ***, *** deep venous reflux   Medical Decision Making   Evelyn Baker is a 61 y.o. female who presents with: ***LE chronic venous insufficiency, ***varicose veins with complications  Based on the patient's history and examination, I recommend: ***. I discussed with the patient the use of her 20-30 mm thigh high compression stockings and need for 3 month trial of such. The patient will follow up in 3 months with Dr. Marland Kitchen Thank you for allowing [place] to participate in this patient's care.   Luay Balding Emmit Alexanders, PA-C [facility]  [place] Office: [phone]  06/04/2022, 8:53 AM  Clinic MD: ***

## 2022-06-04 NOTE — Progress Notes (Unsigned)
[facility] [place]   Reason for referral: Swollen left leg  History of Present Illness  Evelyn Baker is a 61 y.o. female who presents with chief complaint: swollen leg and erythema in the left LE.  She has a history of ligation and stripping of the left GSV. by Dr. Oneida Alar in July of 2022  She does wear compression daily and uses elevation when at rest.    She was recently seen for right LE varicose veins and the duplex demonstrated no reflux in the  GSV.  She does have some reflux in SSV and AASV. Based on these findings she is not a candidate for venous ablation or other venous interventions.   She called our office on 06/04/22 with new reports of left leg pain.  She has an area or firmness medial left calf.  No erythema, no pitting edema.  She denies trauma to the leg or surgery.  She states it has been hurting for 4 weeks now.  Ice and elevation seem to help some.     Past Medical History:  Diagnosis Date   Asthma    Depression    Dyspnea    Hyperlipidemia    Hypertension    Hypothyroidism    Thyroid disease     Past Surgical History:  Procedure Laterality Date   FOOT SURGERY     STRABISMUS SURGERY     TOTAL ABDOMINAL HYSTERECTOMY     VEIN LIGATION AND STRIPPING Left 01/22/2021   Procedure: LEFT GREATER SAPHENOUS VEIN  LIGATION AND [place];  Surgeon: Elam Dutch, MD;  Location: [facility];  Service: Vascular;  Laterality: Left;    Social History   Socioeconomic History   Marital status: Married    Spouse name: Not on file   Number of children: Not on file   Years of education: Not on file   Highest education level: Not on file  Occupational History   Not on file  Tobacco Use   Smoking status: Never    Passive exposure: Never   Smokeless tobacco: Never  Vaping Use   Vaping Use: Never used  Substance and Sexual Activity   Alcohol use: No   Drug use: No   Sexual activity: Yes    Birth control/protection: Surgical    Comment: partial  hysterectomy  Other Topics Concern   Not on file  Social History Narrative   Not on file   Social Determinants of Health   Financial Resource Strain: Not on file  Food Insecurity: Not on file  Transportation Needs: Not on file  Physical Activity: Not on file  Stress: Not on file  Social Connections: Not on file  Intimate Partner Violence: Not on file    Family History  Problem Relation Age of Onset   Heart failure Mother    Kidney failure Mother     Current Outpatient Medications on File Prior to Visit  Medication Sig Dispense Refill   albuterol (VENTOLIN HFA) 108 (90 Base) MCG/ACT inhaler Inhale 2 puffs into the lungs every 4 (four) hours as needed for wheezing or shortness of breath.     aspirin EC 81 MG tablet Take 81 mg by mouth 2 (two) times daily. Swallow whole.     beclomethasone (QVAR) 40 MCG/ACT inhaler Inhale 2 puffs into the lungs daily as needed (Asthma).     budesonide-formoterol (SYMBICORT) 160-4.5 MCG/ACT inhaler Inhale 2 puffs into the lungs 2 (two) times daily. 10.2 g 0   clindamycin (CLINDAGEL) 1 %  gel Apply 1 application topically daily.     diclofenac (CATAFLAM) 50 MG tablet Take 1 tablet (50 mg total) by mouth 2 (two) times daily as needed with food or milk 60 tablet 1   EPINEPHrine 0.3 mg/0.3 mL IJ SOAJ injection Inject 0.3 mg into the muscle as needed for anaphylaxis.     ibuprofen (ADVIL) 200 MG tablet Take 200-400 mg by mouth 2 (two) times daily.     levothyroxine (SYNTHROID) 112 MCG tablet Take 1 tablet (112 mcg total) by mouth every morning on an empty stomach 30 tablet 6   meclizine (ANTIVERT) 25 MG tablet Take 25 mg by mouth daily as needed for dizziness.     omeprazole (PRILOSEC) 40 MG capsule Take 1 capsule (40 mg total) by mouth daily. 90 capsule 1   triamterene-hydrochlorothiazide (MAXZIDE) 75-50 MG tablet Take 0.5 tablets by mouth daily. 90 tablet 3   No current facility-administered medications on file prior to visit.    Allergies as of  06/05/2022 - Review Complete 06/05/2022  Allergen Reaction Noted   Shellfish allergy Anaphylaxis and Swelling 02/19/2012   Avocado Swelling 01/17/2021   Codeine Other (See Comments) 04/02/2018   Iodinated contrast media Other (See Comments) 05/05/2022   Latex Hives 02/19/2012   Pineapple Swelling 01/17/2021   Penicillins Rash 01/17/2021     ROS:   General:  No weight loss, Fever, chills  HEENT: No recent headaches, no nasal bleeding, no visual changes, no sore throat  Neurologic: No dizziness, blackouts, seizures. No recent symptoms of stroke or mini- stroke. No recent episodes of slurred speech, or temporary blindness.  Cardiac: No recent episodes of chest pain/pressure, no shortness of breath at rest.  No shortness of breath with exertion.  Denies history of atrial fibrillation or irregular heartbeat  Vascular: No history of rest pain in feet.  No history of claudication.  No history of non-healing ulcer,  history of DVT   Pulmonary: No home oxygen, no productive cough, no hemoptysis,  No asthma or wheezing  Musculoskeletal:  '[ ]'$  Arthritis, '[ ]'$  Low back pain,  '[ ]'$  Joint pain  Hematologic:No history of hypercoagulable state.  No history of easy bleeding.  No history of anemia  Gastrointestinal: No hematochezia or melena,  No gastroesophageal reflux, no trouble swallowing  Urinary: '[ ]'$  chronic Kidney disease, '[ ]'$  on HD - '[ ]'$  MWF or '[ ]'$  TTHS, '[ ]'$  Burning with urination, '[ ]'$  Frequent urination, '[ ]'$  Difficulty urinating;   Skin: No rashes  Psychological: No history of anxiety,  No history of depression  Physical Examination  Vitals:   06/05/22 1414  BP: 132/87  Pulse: 98  Temp: 98 F (36.7 C)  TempSrc: Temporal  SpO2: 98%  Weight: 197 lb (89.4 kg)  Height: '5\' 5"'$  (1.651 m)    Body mass index is 32.78 kg/m.  General:  Alert and oriented, no acute distress HEENT: Normal Neck: No bruit or JVD Pulmonary: Clear to auscultation bilaterally Cardiac: Regular Rate and  Rhythm without murmur Abdomen: Soft, non-tender, non-distended, no mass, no scars Skin: No rash Extremity Pulses:  2+ radial, brachial, femoral, dorsalis pedis, posterior tibial pulses bilaterally Musculoskeletal: No deformity or edema  Neurologic: Upper and lower extremity motor 5/5 and symmetric  DATA:  ----+  LEFT             Reflux NoRefluxReflux TimeDiameter cmsComments  Yes                                             +------------------+---------+------+-----------+------------+-------------  ----+  CFV                        yes   >1 second             chronic  thrombus   +------------------+---------+------+-----------+------------+-------------  ----+  FV mid            no                                                        +------------------+---------+------+-----------+------------+-------------  ----+  Popliteal        no                                                        +------------------+---------+------+-----------+------------+-------------  ----+  GSV at [facility]                                              prior                                                                        ablation/strippin                                                         g                   +------------------+---------+------+-----------+------------+-------------  ----+  GSV prox thigh                                          prior                                                                        ablation/strippin  g                   +------------------+---------+------+-----------+------------+-------------  ----+  GSV mid thigh                                           prior                                                                        ablation/strippin                                                          g                   +------------------+---------+------+-----------+------------+-------------  ----+  GSV dist thigh                                          prior                                                                        ablation/strippin                                                         g                   +------------------+---------+------+-----------+------------+-------------  ----+  GSV at knee                                             prior                                                                        ablation/strippin                                                         g                   +------------------+---------+------+-----------+------------+-------------  ----+  GSV prox calf                                           prior                                                                        ablation/strippin                                                         g                   +------------------+---------+------+-----------+------------+-------------  ----+  SSV Pop Fossa     no                            0.22                        +------------------+---------+------+-----------+------------+-------------  ----+  anterior accessoryno                            0.57                        +------------------+---------+------+-----------+------------+-------------  ----+              Assessment/Plan: History of venous reflux s/p  Ligation and stripping of left greater saphenous vein   Left:  - No evidence of acute deep vein thrombosis from the common femoral  through the popliteal veins.  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  - The common femoral vein is not competent.  - The  great saphenous vein has been ablated/stripped.  - The small saphenous vein is competent.   No new acute events and palpable pedal pulse on the left LE.  She should continue with compression, elevation and exercise as tolerates.  The left GSV has been stripped, she has palpable pedal pulses so she is not at risk of limb loss.  She has no fever or chills.  This is not an issue of the vessels.  She may need further soft tissue work up of the left Lateral leg contusion.   F/U with [facility] as needed.     Roxy Horseman PA-C [facility] [place] Office: [phone]  MD in clinic [place]

## 2022-06-05 ENCOUNTER — Ambulatory Visit (HOSPITAL_COMMUNITY)
Admission: RE | Admit: 2022-06-05 | Discharge: 2022-06-05 | Disposition: A | Discharge status: Home / Self Care | Payer: No Typology Code available for payment source | Source: Ambulatory Visit | Attending: Vascular Surgery | Admitting: Vascular Surgery

## 2022-06-05 ENCOUNTER — Ambulatory Visit (INDEPENDENT_AMBULATORY_CARE_PROVIDER_SITE_OTHER): Payer: No Typology Code available for payment source | Admitting: Physician Assistant

## 2022-06-05 ENCOUNTER — Ambulatory Visit: Payer: No Typology Code available for payment source

## 2022-06-05 VITALS — BP 132/87 | HR 98 | Temp 98.0°F | Ht 65.0 in | Wt 197.0 lb

## 2022-06-05 DIAGNOSIS — M7989 Other specified soft tissue disorders: Secondary | ICD-10-CM | POA: Diagnosis present

## 2022-06-05 DIAGNOSIS — I83812 Varicose veins of left lower extremities with pain: Secondary | ICD-10-CM | POA: Diagnosis present

## 2022-06-05 DIAGNOSIS — M79605 Pain in left leg: Secondary | ICD-10-CM

## 2022-06-10 ENCOUNTER — Telehealth: Payer: Self-pay

## 2022-06-10 NOTE — Telephone Encounter (Signed)
Pt called requesting a referral or order for an MRI. She stated that when she saw Gwenette Greet, [place] at her last visit, she mentioned getting an MRI. Informed pt that there was no mention of that in her last office note. Informed her that I would ask her since she was in the office today to clarify.  Reviewed pt's chart, spoke with Gwenette Greet, [place] who stated that there was no vascular cause of pt's symptoms. Pt needs to f/u with PCP. Called pt, two identifiers used. Relayed advice from [place], [place]. Pt will contact PCP for further work-up.

## 2022-06-12 ENCOUNTER — Other Ambulatory Visit (HOSPITAL_COMMUNITY): Payer: Self-pay | Admitting: Family Medicine

## 2022-06-12 DIAGNOSIS — I83813 Varicose veins of bilateral lower extremities with pain: Secondary | ICD-10-CM

## 2022-06-12 DIAGNOSIS — R2242 Localized swelling, mass and lump, left lower limb: Secondary | ICD-10-CM

## 2022-06-14 ENCOUNTER — Ambulatory Visit (HOSPITAL_COMMUNITY)
Admission: RE | Admit: 2022-06-14 | Discharge: 2022-06-14 | Disposition: A | Discharge status: Home / Self Care | Payer: No Typology Code available for payment source | Source: Ambulatory Visit | Attending: Family Medicine | Admitting: Family Medicine

## 2022-06-14 DIAGNOSIS — I83813 Varicose veins of bilateral lower extremities with pain: Secondary | ICD-10-CM | POA: Insufficient documentation

## 2022-06-14 DIAGNOSIS — R2242 Localized swelling, mass and lump, left lower limb: Secondary | ICD-10-CM | POA: Diagnosis present

## 2022-06-24 ENCOUNTER — Other Ambulatory Visit (HOSPITAL_COMMUNITY): Payer: Self-pay

## 2022-06-24 MED ORDER — IBUPROFEN 600 MG PO TABS
600.0000 mg | ORAL_TABLET | Freq: Three times a day (TID) | ORAL | 0 refills | Status: DC | PRN
  Filled 2022-06-24: qty 21, 7d supply, fill #0

## 2022-07-04 DIAGNOSIS — M791 Myalgia, unspecified site: Secondary | ICD-10-CM | POA: Diagnosis not present

## 2022-07-04 DIAGNOSIS — I83813 Varicose veins of bilateral lower extremities with pain: Secondary | ICD-10-CM | POA: Diagnosis not present

## 2022-07-04 DIAGNOSIS — M79662 Pain in left lower leg: Secondary | ICD-10-CM | POA: Diagnosis not present

## 2022-07-07 DIAGNOSIS — M79662 Pain in left lower leg: Secondary | ICD-10-CM | POA: Insufficient documentation

## 2022-07-10 ENCOUNTER — Other Ambulatory Visit (HOSPITAL_COMMUNITY): Payer: Self-pay

## 2022-07-10 DIAGNOSIS — M79662 Pain in left lower leg: Secondary | ICD-10-CM | POA: Diagnosis not present

## 2022-07-10 MED ORDER — PREDNISONE 10 MG PO TABS
ORAL_TABLET | ORAL | 0 refills | Status: AC
  Filled 2022-07-10: qty 20, 8d supply, fill #0

## 2022-07-23 ENCOUNTER — Other Ambulatory Visit (HOSPITAL_COMMUNITY): Payer: Self-pay

## 2022-07-23 DIAGNOSIS — R768 Other specified abnormal immunological findings in serum: Secondary | ICD-10-CM | POA: Diagnosis not present

## 2022-07-23 DIAGNOSIS — M25562 Pain in left knee: Secondary | ICD-10-CM | POA: Diagnosis not present

## 2022-07-23 DIAGNOSIS — M79671 Pain in right foot: Secondary | ICD-10-CM | POA: Diagnosis not present

## 2022-07-23 DIAGNOSIS — M25561 Pain in right knee: Secondary | ICD-10-CM | POA: Diagnosis not present

## 2022-07-23 DIAGNOSIS — M79642 Pain in left hand: Secondary | ICD-10-CM | POA: Diagnosis not present

## 2022-07-23 DIAGNOSIS — M2550 Pain in unspecified joint: Secondary | ICD-10-CM | POA: Diagnosis not present

## 2022-07-23 DIAGNOSIS — M79641 Pain in right hand: Secondary | ICD-10-CM | POA: Diagnosis not present

## 2022-07-23 DIAGNOSIS — R700 Elevated erythrocyte sedimentation rate: Secondary | ICD-10-CM | POA: Diagnosis not present

## 2022-07-23 DIAGNOSIS — M79672 Pain in left foot: Secondary | ICD-10-CM | POA: Diagnosis not present

## 2022-07-23 DIAGNOSIS — M79605 Pain in left leg: Secondary | ICD-10-CM | POA: Diagnosis not present

## 2022-07-23 MED ORDER — PREDNISONE 5 MG PO TABS
5.0000 mg | ORAL_TABLET | ORAL | 0 refills | Status: DC
  Filled 2022-07-23: qty 63, 18d supply, fill #0

## 2022-08-06 ENCOUNTER — Other Ambulatory Visit: Payer: Self-pay | Admitting: Endocrinology

## 2022-08-06 ENCOUNTER — Encounter: Payer: Self-pay | Admitting: Endocrinology

## 2022-08-06 ENCOUNTER — Other Ambulatory Visit (HOSPITAL_COMMUNITY): Payer: Self-pay

## 2022-08-06 DIAGNOSIS — R21 Rash and other nonspecific skin eruption: Secondary | ICD-10-CM | POA: Diagnosis not present

## 2022-08-06 DIAGNOSIS — R700 Elevated erythrocyte sedimentation rate: Secondary | ICD-10-CM

## 2022-08-06 DIAGNOSIS — M0579 Rheumatoid arthritis with rheumatoid factor of multiple sites without organ or systems involvement: Secondary | ICD-10-CM | POA: Diagnosis not present

## 2022-08-06 DIAGNOSIS — Z79899 Other long term (current) drug therapy: Secondary | ICD-10-CM | POA: Diagnosis not present

## 2022-08-06 MED ORDER — PREDNISONE 5 MG PO TABS
10.0000 mg | ORAL_TABLET | Freq: Every day | ORAL | 0 refills | Status: DC
  Filled 2022-08-06: qty 60, 30d supply, fill #0

## 2022-08-07 ENCOUNTER — Ambulatory Visit (HOSPITAL_BASED_OUTPATIENT_CLINIC_OR_DEPARTMENT_OTHER)
Admission: RE | Admit: 2022-08-07 | Discharge: 2022-08-07 | Disposition: A | Discharge status: Home / Self Care | Payer: 59 | Source: Ambulatory Visit | Attending: Endocrinology | Admitting: Endocrinology

## 2022-08-07 ENCOUNTER — Ambulatory Visit (HOSPITAL_BASED_OUTPATIENT_CLINIC_OR_DEPARTMENT_OTHER): Payer: 59

## 2022-08-07 DIAGNOSIS — R700 Elevated erythrocyte sedimentation rate: Secondary | ICD-10-CM | POA: Diagnosis not present

## 2022-08-07 DIAGNOSIS — R911 Solitary pulmonary nodule: Secondary | ICD-10-CM | POA: Diagnosis not present

## 2022-08-07 MED ORDER — IOHEXOL 300 MG/ML  SOLN
75.0000 mL | Freq: Once | INTRAMUSCULAR | Status: AC | PRN
  Administered 2022-08-07: 75 mL via INTRAVENOUS

## 2022-08-15 ENCOUNTER — Other Ambulatory Visit (HOSPITAL_COMMUNITY): Payer: Self-pay

## 2022-08-15 MED ORDER — AZATHIOPRINE 50 MG PO TABS
50.0000 mg | ORAL_TABLET | Freq: Every day | ORAL | 2 refills | Status: DC
  Filled 2022-08-15: qty 30, 30d supply, fill #0

## 2022-08-25 ENCOUNTER — Other Ambulatory Visit (HOSPITAL_COMMUNITY): Payer: Self-pay

## 2022-08-26 ENCOUNTER — Other Ambulatory Visit: Payer: Self-pay

## 2022-08-26 ENCOUNTER — Other Ambulatory Visit (HOSPITAL_COMMUNITY): Payer: Self-pay

## 2022-08-26 DIAGNOSIS — R911 Solitary pulmonary nodule: Secondary | ICD-10-CM | POA: Diagnosis not present

## 2022-08-26 DIAGNOSIS — M057A Rheumatoid arthritis with rheumatoid factor of other specified site without organ or systems involvement: Secondary | ICD-10-CM | POA: Diagnosis not present

## 2022-08-26 DIAGNOSIS — M791 Myalgia, unspecified site: Secondary | ICD-10-CM | POA: Diagnosis not present

## 2022-08-26 DIAGNOSIS — I83813 Varicose veins of bilateral lower extremities with pain: Secondary | ICD-10-CM | POA: Diagnosis not present

## 2022-08-26 DIAGNOSIS — I872 Venous insufficiency (chronic) (peripheral): Secondary | ICD-10-CM | POA: Diagnosis not present

## 2022-08-26 DIAGNOSIS — M79662 Pain in left lower leg: Secondary | ICD-10-CM | POA: Diagnosis not present

## 2022-08-26 MED ORDER — ACETAMINOPHEN-CODEINE 300-15 MG PO TABS
1.0000 | ORAL_TABLET | Freq: Every evening | ORAL | 1 refills | Status: DC | PRN
  Filled 2022-08-26: qty 30, 30d supply, fill #0

## 2022-08-26 MED ORDER — BETAMETHASONE DIPROPIONATE 0.05 % EX CREA
1.0000 | TOPICAL_CREAM | Freq: Two times a day (BID) | CUTANEOUS | 3 refills | Status: DC | PRN
  Filled 2022-08-26 (×2): qty 30, 15d supply, fill #0

## 2022-08-27 ENCOUNTER — Other Ambulatory Visit (HOSPITAL_COMMUNITY): Payer: Self-pay

## 2022-08-28 ENCOUNTER — Other Ambulatory Visit (HOSPITAL_COMMUNITY): Payer: Self-pay

## 2022-09-03 DIAGNOSIS — R21 Rash and other nonspecific skin eruption: Secondary | ICD-10-CM | POA: Diagnosis not present

## 2022-09-03 DIAGNOSIS — M0579 Rheumatoid arthritis with rheumatoid factor of multiple sites without organ or systems involvement: Secondary | ICD-10-CM | POA: Diagnosis not present

## 2022-09-03 DIAGNOSIS — Z79899 Other long term (current) drug therapy: Secondary | ICD-10-CM | POA: Diagnosis not present

## 2022-09-03 DIAGNOSIS — R700 Elevated erythrocyte sedimentation rate: Secondary | ICD-10-CM | POA: Diagnosis not present

## 2022-09-04 ENCOUNTER — Other Ambulatory Visit (HOSPITAL_BASED_OUTPATIENT_CLINIC_OR_DEPARTMENT_OTHER): Payer: Self-pay | Admitting: Rheumatology

## 2022-09-04 DIAGNOSIS — R700 Elevated erythrocyte sedimentation rate: Secondary | ICD-10-CM

## 2022-09-08 ENCOUNTER — Other Ambulatory Visit (HOSPITAL_COMMUNITY): Payer: Self-pay

## 2022-09-10 ENCOUNTER — Encounter (HOSPITAL_BASED_OUTPATIENT_CLINIC_OR_DEPARTMENT_OTHER): Payer: Self-pay

## 2022-09-10 ENCOUNTER — Ambulatory Visit (HOSPITAL_BASED_OUTPATIENT_CLINIC_OR_DEPARTMENT_OTHER)
Admission: RE | Admit: 2022-09-10 | Discharge: 2022-09-10 | Disposition: A | Discharge status: Home / Self Care | Payer: 59 | Source: Ambulatory Visit | Attending: Rheumatology | Admitting: Rheumatology

## 2022-09-10 ENCOUNTER — Other Ambulatory Visit (HOSPITAL_COMMUNITY): Payer: Self-pay

## 2022-09-10 DIAGNOSIS — J479 Bronchiectasis, uncomplicated: Secondary | ICD-10-CM | POA: Diagnosis not present

## 2022-09-10 DIAGNOSIS — R700 Elevated erythrocyte sedimentation rate: Secondary | ICD-10-CM | POA: Diagnosis not present

## 2022-09-10 MED ORDER — PREDNISONE 5 MG PO TABS
10.0000 mg | ORAL_TABLET | Freq: Every day | ORAL | 0 refills | Status: DC
  Filled 2022-09-10: qty 60, 30d supply, fill #0

## 2022-09-16 ENCOUNTER — Other Ambulatory Visit: Payer: Self-pay | Admitting: Family Medicine

## 2022-09-16 ENCOUNTER — Encounter: Payer: Self-pay | Admitting: Family Medicine

## 2022-09-16 DIAGNOSIS — R590 Localized enlarged lymph nodes: Secondary | ICD-10-CM

## 2022-09-22 DIAGNOSIS — N6489 Other specified disorders of breast: Secondary | ICD-10-CM | POA: Diagnosis not present

## 2022-09-22 DIAGNOSIS — R928 Other abnormal and inconclusive findings on diagnostic imaging of breast: Secondary | ICD-10-CM | POA: Diagnosis not present

## 2022-09-25 ENCOUNTER — Encounter: Payer: Self-pay | Admitting: Internal Medicine

## 2022-09-25 ENCOUNTER — Ambulatory Visit: Payer: 59 | Admitting: Internal Medicine

## 2022-09-25 VITALS — BP 130/80 | HR 98 | Ht 65.0 in | Wt 198.6 lb

## 2022-09-25 DIAGNOSIS — D84821 Immunodeficiency due to drugs: Secondary | ICD-10-CM | POA: Diagnosis not present

## 2022-09-25 DIAGNOSIS — Z79899 Other long term (current) drug therapy: Secondary | ICD-10-CM | POA: Diagnosis not present

## 2022-09-25 DIAGNOSIS — R918 Other nonspecific abnormal finding of lung field: Secondary | ICD-10-CM

## 2022-09-25 DIAGNOSIS — Z8739 Personal history of other diseases of the musculoskeletal system and connective tissue: Secondary | ICD-10-CM | POA: Diagnosis not present

## 2022-09-25 DIAGNOSIS — Z8709 Personal history of other diseases of the respiratory system: Secondary | ICD-10-CM | POA: Diagnosis not present

## 2022-09-25 NOTE — Progress Notes (Signed)
OV 09/25/2022  Subjective:  Patient ID: Jefm Miles, female , DOB: March 03, 1961 , age 62 y.o. , MRN: [id number] , ADDRESS: [place] [id number] PCP Marda Stalker, PA-C Patient Care Team: Marda Stalker, PA-C as PCP - General (Family Medicine)  This Provider for this visit: Treatment Team:  Attending Provider: Brand Males, MD    09/25/2022 -   Chief Complaint  Patient presents with   Consult    Lung nodule. Possible ILD     HPI YOZELIN SEVERO 62 y.o. -62 year old female accompanied by her husband.  She is originally from [place].  Moved to [place] area in 1993.  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  She tells me that she was doing well till around Thanksgiving 2023.  She then started developing problems in the left shin including warmth and discoloration.  She was subsequently diagnosed to have rheumatoid arthritis as the main etiology for the problem.  She has been on Imuran and prednisone by Dr. Kathlene November.  She says she is having a lot of flareups but she denies any chronic shortness of breath or cough or wheezing.  She also recollects that end of January 2024 she had influenza that was confirmed.  She had a lot of cough and this ultimately resolved.  Husband also was sick at this time.  Because of her rheumatoid arthritis issues she ended up having a CT scan of the chest abdomen pelvis 08/07/2022.  I personally visualized this and I agree with the radiologist that shows some groundglass opacities particularly in the right lower lobe and left lower lobe.  There is also some in the right upper lobe anterior segment.  This is a CT chest with contrast.  Given the concern of these infiltrates but also because of rheumatoid arthritis she had a follow-up scan 3 weeks later on 09/10/2019.  This was a high-resolution CT chest without contrast that I personally visualized.  A lot of these groundglass opacities have resolved/improved.  In  the right upper lobe anterior segment that is now like a healing scar.  Radiologist also feels as widespread bronchiectasis although I am not personally able to appreciate that.  She also has diffuse lymphadenopathy.  She says she had a biopsy for this and the biopsies are benign.  The biopsy report are not available to me.  Currently she not having any shortness of breath cough or wheezing and she is asymptomatic and feeling well.  No B symptoms.  She is worried about ILD.    CT Chest data - HRCT 09/10/22  DDENDUM: As mentioned in the body of the report, there are multiple bilateral axillary and subpectoral lymph nodes (right-greater-than-left). This is nonspecific, but correlation with mammography and physical examination is recommended, as the possibility of malignancy such as breast cancer is not excluded.   These results will be called to the ordering clinician or representative by the Radiologist Assistant, and communication documented in the PACS or [employer].     Electronically Signed   By: Vinnie Langton M.D.   On: 09/15/2022 05:47    Addended by Etheleen Mayhew, MD on 09/15/2022  5:49 AM    Study Result  Narrative & Impression  CLINICAL DATA:  62 year old female with history of asthma, shortness of breath and elevated sedimentation rate. Evaluate for interstitial lung disease.   EXAM: CT CHEST WITHOUT CONTRAST   TECHNIQUE: Multidetector CT imaging of the chest was performed following the standard protocol  without intravenous contrast. High resolution imaging of the lungs, as well as inspiratory and expiratory imaging, was performed.   RADIATION DOSE REDUCTION: This exam was performed according to the departmental dose-optimization program which includes automated exposure control, adjustment of the mA and/or kV according to patient size and/or use of iterative reconstruction technique.   COMPARISON:  CT of the chest, abdomen and pelvis 08/07/2022.    FINDINGS: Cardiovascular: Heart size is normal. Trace amount of pericardial fluid and/or thickening, unlikely to be of any hemodynamic significance at this time. No pericardial calcification. There is aortic atherosclerosis, as well as atherosclerosis of the great vessels of the mediastinum and the coronary arteries, including calcified atherosclerotic plaque in the left anterior descending and right coronary arteries.   Mediastinum/Nodes: No pathologically enlarged mediastinal or hilar lymph nodes. Please note that accurate exclusion of hilar adenopathy is limited on noncontrast CT scans. Esophagus is unremarkable in appearance. Numerous prominent axillary and subpectoral lymph nodes bilaterally (right-greater-than-left), largest is on the right side (axial image 24 of series 2) measuring 1.5 cm in short axis, increased compared to the prior study.   Lungs/Pleura: Widespread areas of cylindrical bronchiectasis with thickening of the peribronchovascular interstitium noted in the lungs bilaterally, most severe in the lung bases where there is some regional architectural distortion and some patchy ground-glass attenuation and volume loss. No other generalized regions of ground-glass attenuation, septal thickening, subpleural reticulation or honeycombing are noted. Inspiratory and expiratory imaging demonstrates some mild air trapping indicative of small airways disease. No acute consolidative airspace disease. No pleural effusions. No definite suspicious appearing pulmonary nodules or masses are noted.   Upper Abdomen: Unremarkable.   Musculoskeletal: There are no aggressive appearing lytic or blastic lesions noted in the visualized portions of the skeleton.   IMPRESSION: 1. 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 Further clinical evaluation is recommended. Strictly  speaking, interstitial lung disease is not entirely excluded, but not favored on the basis of today's examination. Repeat high-resolution chest CT should be considered in 12 months to assess for temporal changes in the appearance of the lung parenchyma. 2. Aortic atherosclerosis, in addition to two-vessel coronary artery disease. Please note that although the presence of coronary artery calcium documents the presence of coronary artery disease, the severity of this disease and any potential stenosis cannot be assessed on this non-gated CT examination. Assessment for potential risk factor modification, dietary therapy or pharmacologic therapy may be warranted, if clinically indicated. 3. Trace volume of pericardial fluid and/or thickening, unlikely of hemodynamic significance at this time.   Aortic Atherosclerosis (ICD10-I70.0).   Electronically Signed: By: Vinnie Langton M.D. On: 09/14/2022 12:02          has a past medical history of Asthma, Depression, Dyspnea, Hyperlipidemia, Hypertension, Hypothyroidism, and Thyroid disease.   reports that she has never smoked. She has never been exposed to tobacco smoke. She has never used smokeless tobacco.  Past Surgical History:  Procedure Laterality Date   FOOT SURGERY     STRABISMUS SURGERY     TOTAL ABDOMINAL HYSTERECTOMY     VEIN LIGATION AND STRIPPING Left 01/22/2021   Procedure: LEFT GREATER SAPHENOUS VEIN  LIGATION AND STRIPPING;  Surgeon: Elam Dutch, MD;  Location: [place];  Service: Vascular;  Laterality: Left;    Allergies  Allergen Reactions   Shellfish Allergy Anaphylaxis and Swelling   Avocado Swelling    Lips   Codeine Other (  See Comments)    Constipation   Iodinated Contrast Media Other (See Comments)   Latex Hives    Power gloves   Pineapple Swelling    lip   Penicillins Rash    Reaction: Childhood    Immunization History  Administered Date(s) Administered   Influenza-Unspecified 03/30/2014,  07/13/2019    Family History  Problem Relation Age of Onset   Heart failure Mother    Kidney failure Mother      Current Outpatient Medications:    albuterol (VENTOLIN HFA) 108 (90 Base) MCG/ACT inhaler, Inhale 2 puffs into the lungs every 4 (four) hours as needed for wheezing or shortness of breath., Disp: , Rfl:    aspirin EC 81 MG tablet, Take 81 mg by mouth 2 (two) times daily. Swallow whole., Disp: , Rfl:    azaTHIOprine (IMURAN) 50 MG tablet, Take 1 tablet (50 mg total) by mouth daily., Disp: 30 tablet, Rfl: 2   beclomethasone (QVAR) 40 MCG/ACT inhaler, Inhale 2 puffs into the lungs daily as needed (Asthma)., Disp: , Rfl:    betamethasone dipropionate 0.05 % cream, Apply 1 Application topically up to 2 (two) times daily as needed. Avoid face, groin and underarms., Disp: 60 g, Rfl: 3   budesonide-formoterol (SYMBICORT) 160-4.5 MCG/ACT inhaler, Inhale 2 puffs into the lungs 2 (two) times daily., Disp: 10.2 g, Rfl: 0   EPINEPHrine 0.3 mg/0.3 mL IJ SOAJ injection, Inject 0.3 mg into the muscle as needed for anaphylaxis., Disp: , Rfl:    levothyroxine (SYNTHROID) 112 MCG tablet, Take 1 tablet (112 mcg total) by mouth every morning on an empty stomach, Disp: 30 tablet, Rfl: 6   meclizine (ANTIVERT) 25 MG tablet, Take 25 mg by mouth daily as needed for dizziness., Disp: , Rfl:    omeprazole (PRILOSEC) 40 MG capsule, Take 1 capsule (40 mg total) by mouth daily., Disp: 90 capsule, Rfl: 1   predniSONE (DELTASONE) 5 MG tablet, Take 2 tablets (10 mg total) by mouth daily., Disp: 60 tablet, Rfl: 0   triamterene-hydrochlorothiazide (MAXZIDE) 75-50 MG tablet, Take 0.5 tablets by mouth daily., Disp: 90 tablet, Rfl: 3   acetaminophen-codeine (TYLENOL #2) 300-15 MG tablet, Take 1 tablet by mouth at bedtime as needed. (Patient not taking: Reported on 09/25/2022), Disp: 30 tablet, Rfl: 1   clindamycin (CLINDAGEL) 1 % gel, Apply 1 application topically daily. (Patient not taking: Reported on 09/25/2022),  Disp: , Rfl:    diclofenac (CATAFLAM) 50 MG tablet, Take 1 tablet (50 mg total) by mouth 2 (two) times daily as needed with food or milk (Patient not taking: Reported on 09/25/2022), Disp: 60 tablet, Rfl: 1   ibuprofen (ADVIL) 200 MG tablet, Take 200-400 mg by mouth 2 (two) times daily. (Patient not taking: Reported on 09/25/2022), Disp: , Rfl:    ibuprofen (ADVIL) 600 MG tablet, Take 1 tablet (600 mg total) by mouth 3 (three) times daily as needed with food or milk (Patient not taking: Reported on 09/25/2022), Disp: 21 tablet, Rfl: 0      Objective:   Vitals:   09/25/22 1516  BP: 130/80  Pulse: 98  SpO2: 97%  Weight: 198 lb 9.6 oz (90.1 kg)  Height: 5\' 5"  (1.651 m)    Estimated body mass index is 33.05 kg/m as calculated from the following:   Height as of this encounter: 5\' 5"  (1.651 m).   Weight as of this encounter: 198 lb 9.6 oz (90.1 kg).  @WEIGHTCHANGE @  [employer]   09/25/22 1516  Weight: 198 lb  9.6 oz (90.1 kg)     Physical Exam    General: No distress. Looks well Neuro: Alert and Oriented x 3. GCS 15. Speech normal Psych: Pleasant Resp:  Barrel Chest - no.  Wheeze - no, Crackles - no, No overt respiratory distress CVS: Normal heart sounds. Murmurs - no Ext: Stigmata of Connective Tissue Disease - no BUT LLE SHIN - DARKER and SLIGHTLY WARMER HEENT: Normal upper airway. PEERL +. No post nasal drip        Assessment:       ICD-10-CM   1. History of rheumatoid arthritis  Z87.39     2. History of influenza  Z87.09     3. Ground glass opacity present on imaging of lung  R91.8     4. Immunosuppression due to drug therapy [facility])  [id number]          Plan:     Patient Instructions     ICD-10-CM   1. History of rheumatoid arthritis  Z87.39     2. History of influenza  Z87.09     3. Ground glass opacity present on imaging of lung  R91.8      Glad you are better after flu in end of Jan 2024 To me the CT chest in feb 2024 and early MArch  2024 are consistent with flu involving lung and healing process We need to keep an eye and see if there is any permanent residual lung damage especially in setting of RA  PLAN - do HRCT supine and prone in 3 months - do full PFT in 3 months - return sooner or call if any respiratory issues  Followup - 15 min visit in 3 months to review reesults     SIGNATURE    Dr. Brand Males, M.D., F.C.C.P,  Pulmonary and Critical Care Medicine Staff Physician, [place] Director - Interstitial Lung Disease  Program  Pulmonary [place] [facility] [place], [place], [address]  Pager: [phone], If no answer or between  15:00h - 7:00h: call [phone] Telephone: [phone]  4:11 PM 09/25/2022

## 2022-09-25 NOTE — Patient Instructions (Signed)
ICD-10-CM   1. History of rheumatoid arthritis  Z87.39     2. History of influenza  Z87.09     3. Ground glass opacity present on imaging of lung  R91.8      Glad you are better after flu in end of Jan 2024 To me the CT chest in feb 2024 and early MArch 2024 are consistent with flu involving lung and healing process We need to keep an eye and see if there is any permanent residual lung damage especially in setting of RA  PLAN - do HRCT supine and prone in 3 months - do full PFT in 3 months - return sooner or call if any respiratory issues  Followup - 15 min visit in 3 months to review reesults

## 2022-10-01 ENCOUNTER — Other Ambulatory Visit (HOSPITAL_COMMUNITY): Payer: Self-pay

## 2022-10-01 MED ORDER — AZATHIOPRINE 100 MG PO TABS
100.0000 mg | ORAL_TABLET | Freq: Every day | ORAL | 2 refills | Status: DC
  Filled 2022-10-01: qty 30, 30d supply, fill #0
  Filled 2022-12-03: qty 30, 30d supply, fill #1
  Filled 2023-01-02: qty 30, 30d supply, fill #2

## 2022-10-02 ENCOUNTER — Other Ambulatory Visit: Payer: 59

## 2022-10-02 DIAGNOSIS — M79662 Pain in left lower leg: Secondary | ICD-10-CM | POA: Diagnosis not present

## 2022-10-02 DIAGNOSIS — M057A Rheumatoid arthritis with rheumatoid factor of other specified site without organ or systems involvement: Secondary | ICD-10-CM | POA: Diagnosis not present

## 2022-10-02 DIAGNOSIS — Z6833 Body mass index (BMI) 33.0-33.9, adult: Secondary | ICD-10-CM | POA: Diagnosis not present

## 2022-10-02 DIAGNOSIS — M791 Myalgia, unspecified site: Secondary | ICD-10-CM | POA: Diagnosis not present

## 2022-10-02 DIAGNOSIS — R911 Solitary pulmonary nodule: Secondary | ICD-10-CM | POA: Diagnosis not present

## 2022-10-09 ENCOUNTER — Other Ambulatory Visit (HOSPITAL_COMMUNITY): Payer: Self-pay

## 2022-10-13 ENCOUNTER — Institutional Professional Consult (permissible substitution): Payer: 59 | Admitting: Internal Medicine

## 2022-10-13 ENCOUNTER — Other Ambulatory Visit (HOSPITAL_COMMUNITY): Payer: Self-pay

## 2022-10-13 MED ORDER — PREDNISONE 5 MG PO TABS
10.0000 mg | ORAL_TABLET | Freq: Every day | ORAL | 0 refills | Status: DC
  Filled 2022-10-13: qty 60, 30d supply, fill #0

## 2022-10-14 ENCOUNTER — Other Ambulatory Visit: Payer: Self-pay

## 2022-10-15 DIAGNOSIS — M0579 Rheumatoid arthritis with rheumatoid factor of multiple sites without organ or systems involvement: Secondary | ICD-10-CM | POA: Diagnosis not present

## 2022-10-15 DIAGNOSIS — R21 Rash and other nonspecific skin eruption: Secondary | ICD-10-CM | POA: Diagnosis not present

## 2022-10-15 DIAGNOSIS — R700 Elevated erythrocyte sedimentation rate: Secondary | ICD-10-CM | POA: Diagnosis not present

## 2022-10-15 DIAGNOSIS — Z79899 Other long term (current) drug therapy: Secondary | ICD-10-CM | POA: Diagnosis not present

## 2022-10-16 ENCOUNTER — Other Ambulatory Visit: Payer: Self-pay | Admitting: Rheumatology

## 2022-10-16 ENCOUNTER — Other Ambulatory Visit (HOSPITAL_COMMUNITY): Payer: Self-pay

## 2022-10-16 ENCOUNTER — Other Ambulatory Visit: Payer: Self-pay

## 2022-10-16 DIAGNOSIS — H40013 Open angle with borderline findings, low risk, bilateral: Secondary | ICD-10-CM | POA: Diagnosis not present

## 2022-10-16 DIAGNOSIS — H33331 Multiple defects of retina without detachment, right eye: Secondary | ICD-10-CM | POA: Diagnosis not present

## 2022-10-16 DIAGNOSIS — M79605 Pain in left leg: Secondary | ICD-10-CM

## 2022-10-24 DIAGNOSIS — E039 Hypothyroidism, unspecified: Secondary | ICD-10-CM | POA: Diagnosis not present

## 2022-10-31 ENCOUNTER — Other Ambulatory Visit (HOSPITAL_COMMUNITY): Payer: Self-pay

## 2022-10-31 DIAGNOSIS — M0579 Rheumatoid arthritis with rheumatoid factor of multiple sites without organ or systems involvement: Secondary | ICD-10-CM | POA: Diagnosis not present

## 2022-10-31 DIAGNOSIS — E039 Hypothyroidism, unspecified: Secondary | ICD-10-CM | POA: Diagnosis not present

## 2022-10-31 DIAGNOSIS — E049 Nontoxic goiter, unspecified: Secondary | ICD-10-CM | POA: Diagnosis not present

## 2022-10-31 DIAGNOSIS — I10 Essential (primary) hypertension: Secondary | ICD-10-CM | POA: Diagnosis not present

## 2022-10-31 MED ORDER — LEVOTHYROXINE SODIUM 125 MCG PO TABS
125.0000 ug | ORAL_TABLET | Freq: Every morning | ORAL | 5 refills | Status: DC
  Filled 2022-10-31: qty 30, 30d supply, fill #0
  Filled 2022-12-02: qty 30, 30d supply, fill #1
  Filled 2023-01-02: qty 30, 30d supply, fill #2
  Filled 2023-01-29: qty 30, 30d supply, fill #3
  Filled 2023-02-27: qty 30, 30d supply, fill #4

## 2022-11-04 ENCOUNTER — Other Ambulatory Visit (HOSPITAL_BASED_OUTPATIENT_CLINIC_OR_DEPARTMENT_OTHER): Payer: Self-pay | Admitting: Rheumatology

## 2022-11-04 DIAGNOSIS — M79604 Pain in right leg: Secondary | ICD-10-CM

## 2022-11-04 DIAGNOSIS — M79605 Pain in left leg: Secondary | ICD-10-CM

## 2022-11-11 ENCOUNTER — Other Ambulatory Visit (HOSPITAL_COMMUNITY): Payer: Self-pay

## 2022-11-11 ENCOUNTER — Other Ambulatory Visit (HOSPITAL_BASED_OUTPATIENT_CLINIC_OR_DEPARTMENT_OTHER): Payer: Self-pay | Admitting: Family Medicine

## 2022-11-11 DIAGNOSIS — R911 Solitary pulmonary nodule: Secondary | ICD-10-CM

## 2022-11-12 ENCOUNTER — Other Ambulatory Visit (HOSPITAL_COMMUNITY): Payer: Self-pay

## 2022-11-12 MED ORDER — PREDNISONE 5 MG PO TABS
10.0000 mg | ORAL_TABLET | Freq: Every day | ORAL | 0 refills | Status: DC
  Filled 2022-11-12: qty 60, 30d supply, fill #0

## 2022-11-21 ENCOUNTER — Ambulatory Visit (HOSPITAL_BASED_OUTPATIENT_CLINIC_OR_DEPARTMENT_OTHER): Payer: 59

## 2022-11-21 ENCOUNTER — Ambulatory Visit (HOSPITAL_BASED_OUTPATIENT_CLINIC_OR_DEPARTMENT_OTHER): Admission: RE | Admit: 2022-11-21 | Payer: 59 | Source: Ambulatory Visit

## 2022-11-21 ENCOUNTER — Encounter (HOSPITAL_BASED_OUTPATIENT_CLINIC_OR_DEPARTMENT_OTHER): Payer: Self-pay

## 2022-12-05 ENCOUNTER — Other Ambulatory Visit (HOSPITAL_COMMUNITY): Payer: Self-pay

## 2022-12-05 ENCOUNTER — Ambulatory Visit (HOSPITAL_BASED_OUTPATIENT_CLINIC_OR_DEPARTMENT_OTHER)
Admission: RE | Admit: 2022-12-05 | Discharge: 2022-12-05 | Disposition: A | Discharge status: Home / Self Care | Payer: 59 | Source: Ambulatory Visit | Attending: Rheumatology | Admitting: Rheumatology

## 2022-12-05 DIAGNOSIS — M79605 Pain in left leg: Secondary | ICD-10-CM | POA: Diagnosis not present

## 2022-12-05 DIAGNOSIS — M79604 Pain in right leg: Secondary | ICD-10-CM

## 2022-12-05 DIAGNOSIS — R600 Localized edema: Secondary | ICD-10-CM | POA: Diagnosis not present

## 2022-12-10 ENCOUNTER — Other Ambulatory Visit (HOSPITAL_COMMUNITY): Payer: Self-pay

## 2022-12-10 MED ORDER — PREDNISONE 5 MG PO TABS
10.0000 mg | ORAL_TABLET | Freq: Every day | ORAL | 0 refills | Status: DC
  Filled 2022-12-10: qty 60, 30d supply, fill #0

## 2022-12-11 ENCOUNTER — Other Ambulatory Visit (HOSPITAL_COMMUNITY): Payer: Self-pay

## 2022-12-11 MED ORDER — PREDNISONE 5 MG PO TABS
10.0000 mg | ORAL_TABLET | Freq: Every day | ORAL | 0 refills | Status: DC
  Filled 2022-12-11: qty 60, 30d supply, fill #0

## 2022-12-12 ENCOUNTER — Other Ambulatory Visit (HOSPITAL_COMMUNITY): Payer: Self-pay

## 2022-12-18 DIAGNOSIS — R21 Rash and other nonspecific skin eruption: Secondary | ICD-10-CM | POA: Diagnosis not present

## 2022-12-18 DIAGNOSIS — Z79899 Other long term (current) drug therapy: Secondary | ICD-10-CM | POA: Diagnosis not present

## 2022-12-18 DIAGNOSIS — M0579 Rheumatoid arthritis with rheumatoid factor of multiple sites without organ or systems involvement: Secondary | ICD-10-CM | POA: Diagnosis not present

## 2022-12-18 DIAGNOSIS — R700 Elevated erythrocyte sedimentation rate: Secondary | ICD-10-CM | POA: Diagnosis not present

## 2022-12-24 DIAGNOSIS — M79662 Pain in left lower leg: Secondary | ICD-10-CM | POA: Diagnosis not present

## 2022-12-24 DIAGNOSIS — M057A Rheumatoid arthritis with rheumatoid factor of other specified site without organ or systems involvement: Secondary | ICD-10-CM | POA: Diagnosis not present

## 2022-12-24 DIAGNOSIS — M791 Myalgia, unspecified site: Secondary | ICD-10-CM | POA: Diagnosis not present

## 2022-12-24 DIAGNOSIS — R911 Solitary pulmonary nodule: Secondary | ICD-10-CM | POA: Diagnosis not present

## 2022-12-26 ENCOUNTER — Ambulatory Visit (HOSPITAL_COMMUNITY)
Admission: RE | Admit: 2022-12-26 | Discharge: 2022-12-26 | Disposition: A | Discharge status: Home / Self Care | Payer: 59 | Source: Ambulatory Visit | Attending: Internal Medicine | Admitting: Internal Medicine

## 2022-12-26 DIAGNOSIS — Z8739 Personal history of other diseases of the musculoskeletal system and connective tissue: Secondary | ICD-10-CM | POA: Insufficient documentation

## 2022-12-26 DIAGNOSIS — R918 Other nonspecific abnormal finding of lung field: Secondary | ICD-10-CM | POA: Insufficient documentation

## 2022-12-26 DIAGNOSIS — Z8709 Personal history of other diseases of the respiratory system: Secondary | ICD-10-CM | POA: Insufficient documentation

## 2022-12-26 DIAGNOSIS — Z79899 Other long term (current) drug therapy: Secondary | ICD-10-CM | POA: Diagnosis not present

## 2022-12-26 DIAGNOSIS — R590 Localized enlarged lymph nodes: Secondary | ICD-10-CM | POA: Diagnosis not present

## 2022-12-26 DIAGNOSIS — D84821 Immunodeficiency due to drugs: Secondary | ICD-10-CM | POA: Diagnosis not present

## 2022-12-26 DIAGNOSIS — J479 Bronchiectasis, uncomplicated: Secondary | ICD-10-CM | POA: Diagnosis not present

## 2022-12-26 DIAGNOSIS — J984 Other disorders of lung: Secondary | ICD-10-CM | POA: Diagnosis not present

## 2022-12-26 DIAGNOSIS — E041 Nontoxic single thyroid nodule: Secondary | ICD-10-CM | POA: Diagnosis not present

## 2022-12-31 DIAGNOSIS — I10 Essential (primary) hypertension: Secondary | ICD-10-CM | POA: Diagnosis not present

## 2022-12-31 DIAGNOSIS — E039 Hypothyroidism, unspecified: Secondary | ICD-10-CM | POA: Diagnosis not present

## 2023-01-02 ENCOUNTER — Other Ambulatory Visit (HOSPITAL_COMMUNITY): Payer: Self-pay

## 2023-01-09 NOTE — Progress Notes (Signed)
Mild post infections scarring in lung. Otherf indings are small fluid around heart and thyroid nodule. Wil discuss at followup 01/13/23. Will not be calling with rsu,s

## 2023-01-13 ENCOUNTER — Encounter: Payer: Self-pay | Admitting: Internal Medicine

## 2023-01-13 ENCOUNTER — Ambulatory Visit: Payer: 59 | Admitting: Internal Medicine

## 2023-01-13 VITALS — BP 132/88 | HR 95 | Ht 65.0 in | Wt 212.2 lb

## 2023-01-13 DIAGNOSIS — Z79899 Other long term (current) drug therapy: Secondary | ICD-10-CM

## 2023-01-13 DIAGNOSIS — Z8739 Personal history of other diseases of the musculoskeletal system and connective tissue: Secondary | ICD-10-CM | POA: Diagnosis not present

## 2023-01-13 DIAGNOSIS — Z6835 Body mass index (BMI) 35.0-35.9, adult: Secondary | ICD-10-CM

## 2023-01-13 DIAGNOSIS — I2510 Atherosclerotic heart disease of native coronary artery without angina pectoris: Secondary | ICD-10-CM | POA: Diagnosis not present

## 2023-01-13 DIAGNOSIS — D84821 Immunodeficiency due to drugs: Secondary | ICD-10-CM

## 2023-01-13 DIAGNOSIS — Z8709 Personal history of other diseases of the respiratory system: Secondary | ICD-10-CM | POA: Diagnosis not present

## 2023-01-13 DIAGNOSIS — R918 Other nonspecific abnormal finding of lung field: Secondary | ICD-10-CM

## 2023-01-13 DIAGNOSIS — I3139 Other pericardial effusion (noninflammatory): Secondary | ICD-10-CM | POA: Diagnosis not present

## 2023-01-13 NOTE — Patient Instructions (Addendum)
ICD-10-CM   1. Ground glass opacity present on imaging of lung  R91.8     2. History of influenza  Z87.09     3. History of rheumatoid arthritis  Z87.39     4. Immunosuppression due to drug therapy (HCC)  D84.821    Z79.899     5. Coronary artery calcification seen on CT scan  I25.10        Glad you are better after flu in end of Jan 2024 To me the CT chest in feb 2024 and early MArch 2024 are consistent with flu involving lung and healing process The CT chest June 2024 is much much better and close to normal Do not think any of this is RA involving lungs   PLAN - expectant approach is best  - do full PFT in 1 year  Coronary Artery Calcification on CT scan Small amount of pericardial effusion  -Asymptomatic and clinically likely not significant  Plan - refer Dr Rosemary Holms of [facility] for completeness especially in the setting of autoimmune disease p  OBesity  - talk to PCP Jarrett Soho, PA-C abou weight loss drugs  Followup - 15 min visit in 12 months to review reesults

## 2023-01-13 NOTE — Progress Notes (Signed)
OV 09/25/2022  Subjective:  Patient ID: Evelyn Baker, female , DOB: [birth year] , age 62 y.o. , MRN: [id number] , ADDRESS: [address] [place] [address] PCP Jarrett Soho, PA-C Patient Care Team: Jarrett Soho, PA-C as PCP - General (Family Medicine)  This Provider for this visit: Treatment Team:  Attending Provider: Kalman Shan, MD    09/25/2022 -   Chief Complaint  Patient presents with   Consult    Lung nodule. Possible ILD     HPI Evelyn Baker 62 y.o. -62 year old female accompanied by her husband.  She is originally from [place].  Moved to [place] area in 1993.  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  She tells me that she was doing well till around Thanksgiving 2023.  She then started developing problems in the left shin including warmth and discoloration.  She was subsequently diagnosed to have rheumatoid arthritis as the main etiology for the problem.  She has been on Imuran and prednisone by Dr. Deanne Coffer.  She says she is having a lot of flareups but she denies any chronic shortness of breath or cough or wheezing.  She also recollects that end of January 2024 she had influenza that was confirmed.  She had a lot of cough and this ultimately resolved.  Husband also was sick at this time.  Because of her rheumatoid arthritis issues she ended up having a CT scan of the chest abdomen pelvis 08/07/2022.  I personally visualized this and I agree with the radiologist that shows some groundglass opacities particularly in the right lower lobe and left lower lobe.  There is also some in the right upper lobe anterior segment.  This is a CT chest with contrast.  Given the concern of these infiltrates but also because of rheumatoid arthritis she had a follow-up scan 3 weeks later on 09/10/2022.  This was a high-resolution CT chest without contrast that I personally visualized.  A lot of these groundglass opacities have resolved/improved.  In  the right upper lobe anterior segment that is now like a healing scar.  Radiologist also feels as widespread bronchiectasis although I am not personally able to appreciate that.  She also has diffuse lymphadenopathy.  She says she had a biopsy for this and the biopsies are benign.  The biopsy report are not available to me.  Currently she not having any shortness of breath cough or wheezing and she is asymptomatic and feeling well.  No B symptoms.  She is worried about ILD.    CT Chest data - HRCT 09/10/22  DDENDUM: As mentioned in the body of the report, there are multiple bilateral axillary and subpectoral lymph nodes (right-greater-than-left). This is nonspecific, but correlation with mammography and physical examination is recommended, as the possibility of malignancy such as breast cancer is not excluded.   These results will be called to the ordering clinician or representative by the Radiologist Assistant, and communication documented in the PACS or [employer].     Electronically Signed   By: Trudie Reed M.D.   On: 09/15/2022 05:47    Addended by Florencia Reasons, MD on 09/15/2022  5:49 AM    Study Result  Narrative & Impression  CLINICAL DATA:  62 year old female with history of asthma, shortness of breath and elevated sedimentation rate. Evaluate for interstitial lung disease.   EXAM: CT CHEST WITHOUT CONTRAST   TECHNIQUE: Multidetector CT imaging of the chest was performed following the standard protocol  without intravenous contrast. High resolution imaging of the lungs, as well as inspiratory and expiratory imaging, was performed.   RADIATION DOSE REDUCTION: This exam was performed according to the departmental dose-optimization program which includes automated exposure control, adjustment of the mA and/or kV according to patient size and/or use of iterative reconstruction technique.   COMPARISON:  CT of the chest, abdomen and pelvis 08/07/2022.    FINDINGS: Cardiovascular: Heart size is normal. Trace amount of pericardial fluid and/or thickening, unlikely to be of any hemodynamic significance at this time. No pericardial calcification. There is aortic atherosclerosis, as well as atherosclerosis of the great vessels of the mediastinum and the coronary arteries, including calcified atherosclerotic plaque in the left anterior descending and right coronary arteries.   Mediastinum/Nodes: No pathologically enlarged mediastinal or hilar lymph nodes. Please note that accurate exclusion of hilar adenopathy is limited on noncontrast CT scans. Esophagus is unremarkable in appearance. Numerous prominent axillary and subpectoral lymph nodes bilaterally (right-greater-than-left), largest is on the right side (axial image 24 of series 2) measuring 1.5 cm in short axis, increased compared to the prior study.   Lungs/Pleura: Widespread areas of cylindrical bronchiectasis with thickening of the peribronchovascular interstitium noted in the lungs bilaterally, most severe in the lung bases where there is some regional architectural distortion and some patchy ground-glass attenuation and volume loss. No other generalized regions of ground-glass attenuation, septal thickening, subpleural reticulation or honeycombing are noted. Inspiratory and expiratory imaging demonstrates some mild air trapping indicative of small airways disease. No acute consolidative airspace disease. No pleural effusions. No definite suspicious appearing pulmonary nodules or masses are noted.   Upper Abdomen: Unremarkable.   Musculoskeletal: There are no aggressive appearing lytic or blastic lesions noted in the visualized portions of the skeleton.   IMPRESSION: 1. 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 Further clinical evaluation is recommended. Strictly  speaking, interstitial lung disease is not entirely excluded, but not favored on the basis of today's examination. Repeat high-resolution chest CT should be considered in 12 months to assess for temporal changes in the appearance of the lung parenchyma. 2. Aortic atherosclerosis, in addition to two-vessel coronary artery disease. Please note that although the presence of coronary artery calcium documents the presence of coronary artery disease, the severity of this disease and any potential stenosis cannot be assessed on this non-gated CT examination. Assessment for potential risk factor modification, dietary therapy or pharmacologic therapy may be warranted, if clinically indicated. 3. Trace volume of pericardial fluid and/or thickening, unlikely of hemodynamic significance at this time.   Aortic Atherosclerosis (ICD10-I70.0).   Electronically Signed: By: Trudie Reed M.D. On: 09/14/2022 12:02       OV 01/13/2023  Subjective:  Patient ID: Evelyn Baker, female , DOB: 07-22-1960 , age 26 y.o. , MRN: [id number] , ADDRESS: [address] [place] [address] PCP Jarrett Soho, PA-C Patient Care Team: Jarrett Soho, PA-C as PCP - General (Family Medicine)  This Provider for this visit: Treatment Team:  Attending Provider: Kalman Shan, MD    01/13/2023 -   Chief Complaint  Patient presents with   Follow-up    F/u ct      HPI Evelyn Baker 62 y.o. -resents with her husband to discuss test results.  She continues to be asymptomatic.  She maintains herself on Symbicort.  She has not had any rheumatoid arthritis flareu Interim Health status: No new complaints No new  medical problems. No new surgeries. No ER visits. No Urgent care visits. No changes to medications.  She had a CT scan of the chest and the right middle lobe bronchiectasis related infiltrates are significantly improved.  The groundglass opacities on the bottom of the lungs is nearly  resolved.  This is my personal independent interpretation.  There are no new issues.  She continues on Imuran.  She is concerned about her coronary artery calcification and small amount of pericardial fluid seen on both scans.  She does not have any chest pain.  I have referred her to Dr. Rosemary Holms.  She does not want to be seen at [facility] because of the delays.     CT Chest data from date:12/26/2022  - personally visualized and independently interpreted : Yes - my findings are: Read above  arrative & Impression  CLINICAL DATA:  Possible interstitial lung disease.   EXAM: CT CHEST WITHOUT CONTRAST   TECHNIQUE: Multidetector CT imaging of the chest was performed following the standard protocol without intravenous contrast. High resolution imaging of the lungs, as well as inspiratory and expiratory imaging, was performed.   RADIATION DOSE REDUCTION: This exam was performed according to the departmental dose-optimization program which includes automated exposure control, adjustment of the mA and/or kV according to patient size and/or use of iterative reconstruction technique.   COMPARISON:  09/10/2022, 08/07/2022.   FINDINGS: Cardiovascular: Heart is enlarged. Small amount of pericardial fluid may be physiologic.   Mediastinum/Nodes: 2.3 cm low-attenuation left thyroid nodule. No pathologically enlarged mediastinal lymph nodes. Hilar regions are difficult to evaluate without IV contrast. Small to borderline enlarged axillary lymph nodes measure up to 1.4 cm on the right, as on 09/10/2022. Esophagus is grossly unremarkable.   Lungs/Pleura: Mild basilar cylindrical bronchiectasis, peribronchovascular nodularity and ground-glass. Negative for subpleural reticulation, traction bronchiectasis/bronchiolectasis, architectural distortion or honeycombing, including on prone imaging. Lungs are otherwise clear. No pleural fluid. Airway is unremarkable. No air trapping.   Upper Abdomen:  Visualized portions of the liver, gallbladder, adrenal glands, kidneys, spleen, pancreas, stomach and bowel are grossly unremarkable. No upper abdominal adenopathy.   Musculoskeletal: Degenerative changes in the spine. No worrisome lytic or sclerotic lesions.   IMPRESSION: 1. No evidence of interstitial lung disease. 2. Bibasilar postinfectious scarring. 3. Small to borderline enlarged axillary lymph nodes, as before. Difficult to exclude a lymphoproliferative disorder. 4. 2.3 cm low-attenuation left thyroid nodule. Patient underwent thyroid ultrasound 04/25/2022. No follow-up recommended unless clinically warranted. (Ref: J Am Coll Radiol. 2015 Feb;12(2): 143-50).     Electronically Signed   By: Leanna Battles M.D.   On: 12/30/2022 11:49     LAB RESULTS last 96 hours No results found.  LAB RESULTS last 90 days No results found for this or any previous visit (from the past 2160 hour(s)).   PFT      No data to display             has a past medical history of Asthma, Depression, Dyspnea, Hyperlipidemia, Hypertension, Hypothyroidism, and Thyroid disease.   reports that she has never smoked. She has never been exposed to tobacco smoke. She has never used smokeless tobacco.  Past Surgical History:  Procedure Laterality Date   FOOT SURGERY     STRABISMUS SURGERY     TOTAL ABDOMINAL HYSTERECTOMY     VEIN LIGATION AND STRIPPING Left 01/22/2021   Procedure: LEFT GREATER SAPHENOUS VEIN  LIGATION AND STRIPPING;  Surgeon: Sherren Kerns, MD;  Location: [facility] OR;  Service: Vascular;  Laterality: Left;    Allergies  Allergen Reactions   Shellfish Allergy Anaphylaxis and Swelling   Avocado Swelling    Lips   Codeine Other (See Comments)    Constipation   Iodinated Contrast Media Other (See Comments)   Latex Hives    Power gloves   Pineapple Swelling    lip   Penicillins Rash    Reaction: Childhood    Immunization History  Administered Date(s) Administered    Influenza-Unspecified 03/30/2014, 07/13/2019    Family History  Problem Relation Age of Onset   Heart failure Mother    Kidney failure Mother      Current Outpatient Medications:    budesonide-formoterol (SYMBICORT) 160-4.5 MCG/ACT inhaler, Inhale 2 puffs into the lungs 2 (two) times daily., Disp: 10.2 g, Rfl: 0   EPINEPHrine 0.3 mg/0.3 mL IJ SOAJ injection, Inject 0.3 mg into the muscle as needed for anaphylaxis., Disp: , Rfl:    levothyroxine (SYNTHROID) 125 MCG tablet, Take 1 tablet (125 mcg total) by mouth in the morning on an empty stomach., Disp: 30 tablet, Rfl: 5   meclizine (ANTIVERT) 25 MG tablet, Take 25 mg by mouth daily as needed for dizziness., Disp: , Rfl:    omeprazole (PRILOSEC) 40 MG capsule, Take 1 capsule (40 mg total) by mouth daily., Disp: 90 capsule, Rfl: 1   predniSONE (DELTASONE) 5 MG tablet, Take 2 tablets (10 mg total) by mouth daily., Disp: 60 tablet, Rfl: 0   triamterene-hydrochlorothiazide (MAXZIDE) 75-50 MG tablet, Take 0.5 tablets by mouth daily., Disp: 90 tablet, Rfl: 3   albuterol (VENTOLIN HFA) 108 (90 Base) MCG/ACT inhaler, Inhale 2 puffs into the lungs every 4 (four) hours as needed for wheezing or shortness of breath., Disp: , Rfl:    aspirin EC 81 MG tablet, Take 81 mg by mouth 2 (two) times daily. Swallow whole., Disp: , Rfl:    azathioprine (IMURAN) 100 MG tablet, Take 1 tablet (100 mg total) by mouth daily., Disp: 30 tablet, Rfl: 2   beclomethasone (QVAR) 40 MCG/ACT inhaler, Inhale 2 puffs into the lungs daily as needed (Asthma)., Disp: , Rfl:       Objective:   Vitals:   01/13/23 1630  BP: 132/88  Pulse: 95  SpO2: 99%  Weight: 212 lb 3.2 oz (96.3 kg)  Height: 5\' 5"  (1.651 m)    Estimated body mass index is 35.31 kg/m as calculated from the following:   Height as of this encounter: 5\' 5"  (1.651 m).   Weight as of this encounter: 212 lb 3.2 oz (96.3 kg).  @WEIGHTCHANGE @  [employer]   01/13/23 1630  Weight: 212 lb 3.2 oz (96.3 kg)      Physical Exam   General: No distress. Looks well O2 at rest: no Cane present: no Sitting in wheel chair: no Frail: no Obese: yes Neuro: Alert and Oriented x 3. GCS 15. Speech normal Psych: Pleasant Resp:  Barrel Chest - no.  Wheeze - no, Crackles - no, No overt respiratory distress  HEENT: Normal upper airway. PEERL +. No post nasal drip        Assessment:       ICD-10-CM   1. Ground glass opacity present on imaging of lung  R91.8     2. History of influenza  Z87.09     3. History of rheumatoid arthritis  Z87.39 Pulmonary function test    4. Immunosuppression due to drug therapy (HCC)  D84.821    Z79.899     5. Coronary artery  calcification seen on CT scan  I25.10     6. Pericardial effusion  I31.39     7. Class 2 severe obesity with serious comorbidity and body mass index (BMI) of 35.0 to 35.9 in adult, unspecified obesity type (HCC)  E66.01    Z68.35          Plan:     Patient Instructions     ICD-10-CM   1. Ground glass opacity present on imaging of lung  R91.8     2. History of influenza  Z87.09     3. History of rheumatoid arthritis  Z87.39     4. Immunosuppression due to drug therapy (HCC)  D84.821    Z79.899     5. Coronary artery calcification seen on CT scan  I25.10        Glad you are better after flu in end of Jan 2024 To me the CT chest in feb 2024 and early MArch 2024 are consistent with flu involving lung and healing process The CT chest June 2024 is much much better and close to normal Do not think any of this is RA involving lungs   PLAN - expectant approach is best  - do full PFT in 1 year  Coronary Artery Calcification on CT scan Small amount of pericardial effusion  -Asymptomatic and clinically likely not significant  Plan - refer Dr Rosemary Holms of [facility] for completeness especially in the setting of autoimmune disease p  OBesity  - talk to PCP Jarrett Soho, PA-C abou weight loss  drugs  Followup - 15 min visit in 12 months to review reesults   FOLLOWUP Return in about 1 year (around 01/13/2024) for 15 min visit, Face to Face Visit, with Dr Marchelle Gearing.  (Level 04 E&M 2024: Estb >= 30 min visit type: on-site physical face to visit visit spent in total care time and counseling or/and coordination of care by this undersigned MD - Dr Kalman Shan. This includes one or more of the following on this same day 01/13/2023: pre-charting, chart review, note writing, documentation discussion of test results, diagnostic or treatment recommendations, prognosis, risks and benefits of management options, instructions, education, compliance or risk-factor reduction. It excludes time spent by the CMA or office staff in the care of the patient . Actual time is 30 min)   SIGNATURE    Dr. Kalman Shan, M.D., F.C.C.P,  Pulmonary and Critical Care Medicine Staff Physician, [facility] Center Director - Interstitial Lung Disease  Program  Pulmonary Fibrosis [facility] [place], [place], [address]  Pager: [phone], If no answer or between  15:00h - 7:00h: call [phone] Telephone: [phone]  5:15 PM 01/13/2023

## 2023-01-20 ENCOUNTER — Telehealth: Payer: Self-pay | Admitting: Internal Medicine

## 2023-01-20 DIAGNOSIS — M359 Systemic involvement of connective tissue, unspecified: Secondary | ICD-10-CM

## 2023-01-20 NOTE — Telephone Encounter (Signed)
Cardiologist states they have not rcvd a referral for PT

## 2023-01-21 ENCOUNTER — Other Ambulatory Visit (HOSPITAL_COMMUNITY): Payer: Self-pay

## 2023-01-21 ENCOUNTER — Other Ambulatory Visit (HOSPITAL_BASED_OUTPATIENT_CLINIC_OR_DEPARTMENT_OTHER): Payer: Self-pay | Admitting: Family Medicine

## 2023-01-21 DIAGNOSIS — G459 Transient cerebral ischemic attack, unspecified: Secondary | ICD-10-CM | POA: Diagnosis not present

## 2023-01-21 MED ORDER — ROSUVASTATIN CALCIUM 5 MG PO TABS
5.0000 mg | ORAL_TABLET | Freq: Every day | ORAL | 0 refills | Status: DC
  Filled 2023-01-21: qty 30, 30d supply, fill #0

## 2023-01-21 NOTE — Telephone Encounter (Signed)
Referral for Dr Rosemary Holms of [facility] placed. Pt will get a call from the cardiologist. Nothing further needed.

## 2023-01-22 ENCOUNTER — Ambulatory Visit (HOSPITAL_BASED_OUTPATIENT_CLINIC_OR_DEPARTMENT_OTHER)
Admission: RE | Admit: 2023-01-22 | Discharge: 2023-01-22 | Disposition: A | Discharge status: Home / Self Care | Payer: 59 | Source: Ambulatory Visit | Attending: Family Medicine | Admitting: Family Medicine

## 2023-01-22 DIAGNOSIS — G459 Transient cerebral ischemic attack, unspecified: Secondary | ICD-10-CM | POA: Diagnosis not present

## 2023-01-22 DIAGNOSIS — I639 Cerebral infarction, unspecified: Secondary | ICD-10-CM | POA: Diagnosis not present

## 2023-01-22 MED ORDER — GADOBUTROL 1 MMOL/ML IV SOLN
9.6000 mL | Freq: Once | INTRAVENOUS | Status: AC | PRN
  Administered 2023-01-22: 9.6 mL via INTRAVENOUS
  Filled 2023-01-22: qty 10

## 2023-01-27 ENCOUNTER — Ambulatory Visit: Payer: 59 | Admitting: Cardiology

## 2023-01-29 ENCOUNTER — Other Ambulatory Visit (HOSPITAL_COMMUNITY): Payer: Self-pay

## 2023-01-29 ENCOUNTER — Other Ambulatory Visit: Payer: Self-pay

## 2023-01-29 MED ORDER — PREDNISONE 5 MG PO TABS
10.0000 mg | ORAL_TABLET | Freq: Every day | ORAL | 0 refills | Status: DC
  Filled 2023-01-29: qty 60, 30d supply, fill #0

## 2023-02-02 DIAGNOSIS — E039 Hypothyroidism, unspecified: Secondary | ICD-10-CM | POA: Diagnosis not present

## 2023-02-02 DIAGNOSIS — H6123 Impacted cerumen, bilateral: Secondary | ICD-10-CM | POA: Diagnosis not present

## 2023-02-02 DIAGNOSIS — R739 Hyperglycemia, unspecified: Secondary | ICD-10-CM | POA: Diagnosis not present

## 2023-02-02 DIAGNOSIS — E7800 Pure hypercholesterolemia, unspecified: Secondary | ICD-10-CM | POA: Diagnosis not present

## 2023-02-02 DIAGNOSIS — Z0000 Encounter for general adult medical examination without abnormal findings: Secondary | ICD-10-CM | POA: Diagnosis not present

## 2023-02-02 DIAGNOSIS — Z6835 Body mass index (BMI) 35.0-35.9, adult: Secondary | ICD-10-CM | POA: Diagnosis not present

## 2023-02-04 ENCOUNTER — Encounter: Payer: Self-pay | Admitting: Neurology

## 2023-02-04 ENCOUNTER — Other Ambulatory Visit (HOSPITAL_COMMUNITY): Payer: Self-pay

## 2023-02-04 ENCOUNTER — Ambulatory Visit: Payer: 59 | Admitting: Neurology

## 2023-02-04 ENCOUNTER — Telehealth: Payer: Self-pay | Admitting: Neurology

## 2023-02-04 VITALS — BP 151/87 | HR 103 | Ht 64.0 in | Wt 211.0 lb

## 2023-02-04 DIAGNOSIS — R404 Transient alteration of awareness: Secondary | ICD-10-CM

## 2023-02-04 DIAGNOSIS — E782 Mixed hyperlipidemia: Secondary | ICD-10-CM

## 2023-02-04 DIAGNOSIS — I6381 Other cerebral infarction due to occlusion or stenosis of small artery: Secondary | ICD-10-CM

## 2023-02-04 MED ORDER — CLOPIDOGREL BISULFATE 75 MG PO TABS
75.0000 mg | ORAL_TABLET | Freq: Every day | ORAL | 11 refills | Status: DC
  Filled 2023-02-04: qty 30, 30d supply, fill #0
  Filled 2023-02-27: qty 30, 30d supply, fill #1
  Filled 2023-03-30: qty 30, 30d supply, fill #2
  Filled 2023-03-30 (×2): qty 90, 90d supply, fill #2
  Filled 2023-07-02: qty 90, 90d supply, fill #3
  Filled 2023-09-21: qty 90, 90d supply, fill #4
  Filled 2023-12-18: qty 30, 30d supply, fill #5

## 2023-02-04 NOTE — Telephone Encounter (Signed)
Evelyn Baker NPR sent to [facility] [phone]

## 2023-02-04 NOTE — Progress Notes (Signed)
[facility] [address] [place] [address] [phone]       OFFICE CONSULT NOTE  Ms. Lubertha Sayres Date of Birth:  03-20-1961 Medical Record Number:  [id number]   Referring MD:  Jarrett Soho, PA-c  Reason for Referral:  TIA  HPI: Evelyn Baker is a 62 year old African-American lady seen today for initial office consultation visit for TIA.  History is obtained from the patient and review of referral notes and personally reviewed pertinent available imaging films in PACS.  She has past medical history of hypothyroidism, hypertension, gastroesophageal flux disease and lumbar disc disease.  Patient states that on 01/16/2023 she was at home doing work on her office computer when she felt trembling in her head that lasted several minutes.  She had trouble figuring out the words on the computer screen and and typing.  She stated that she was scribbling and not able to write clearly.  She appeared to be in a daze for couple of hours.  Her husband was sleeping finally woke up and talk to her and she was able to reply.  She denied significant headache, slurred speech, extremity weakness.  She however states she did not try to get up and walk during this episode.  Her husband who had had a stroke felt concerned and patient called primary care physician who ordered an MRI scan of the brain which was done on 01/22/2023 which shows right internal capsule lacunar infarct which is acute.  Patient had some lab work on 02/02/2023 which showed LDL-cholesterol to be 82 mg percent and hemoglobin A1c to be 6.6.  She has not had any neurovascular imaging done.  She denies any prior history of strokes TIA seizures significant head injury with loss of consciousness, migraine headaches or any other neurological problems.  She does not smoke and does not drink alcohol in significant amounts.  She does snore but has not been evaluated for sleep apnea.  She does have an upcoming appointment to see  cardiologist Dr. Rosemary Holms next week.  ROS:   14 system review of systems is positive for head trembling sensation, difficulty reading, difficulty writing, altered mental status and all other systems negative  PMH:  Past Medical History:  Diagnosis Date   Asthma    Depression    Dyspnea    Hyperlipidemia    Hypertension    Hypothyroidism    Thyroid disease     Social History:  Social History   Socioeconomic History   Marital status: Married    Spouse name: Not on file   Number of children: Not on file   Years of education: Not on file   Highest education level: Not on file  Occupational History   Not on file  Tobacco Use   Smoking status: Never    Passive exposure: Never   Smokeless tobacco: Never  Vaping Use   Vaping status: Never Used  Substance and Sexual Activity   Alcohol use: No   Drug use: No   Sexual activity: Yes    Birth control/protection: Surgical    Comment: partial hysterectomy  Other Topics Concern   Not on file  Social History Narrative   Not on file   Social Determinants of Health   Financial Resource Strain: Not on file  Food Insecurity: Not on file  Transportation Needs: Not on file  Physical Activity: Not on file  Stress: Not on file  Social Connections: Not on file  Intimate Partner Violence: Not on file  Medications:   Current Outpatient Medications on File Prior to Visit  Medication Sig Dispense Refill   albuterol (VENTOLIN HFA) 108 (90 Base) MCG/ACT inhaler Inhale 2 puffs into the lungs every 4 (four) hours as needed for wheezing or shortness of breath.     aspirin EC 81 MG tablet Take 81 mg by mouth 2 (two) times daily. Swallow whole.     azathioprine (IMURAN) 100 MG tablet Take 1 tablet (100 mg total) by mouth daily. 30 tablet 2   beclomethasone (QVAR) 40 MCG/ACT inhaler Inhale 2 puffs into the lungs daily as needed (Asthma).     budesonide-formoterol (SYMBICORT) 160-4.5 MCG/ACT inhaler Inhale 2 puffs into the lungs 2 (two)  times daily. 10.2 g 0   EPINEPHrine 0.3 mg/0.3 mL IJ SOAJ injection Inject 0.3 mg into the muscle as needed for anaphylaxis.     levothyroxine (SYNTHROID) 125 MCG tablet Take 1 tablet (125 mcg total) by mouth in the morning on an empty stomach. 30 tablet 5   meclizine (ANTIVERT) 25 MG tablet Take 25 mg by mouth daily as needed for dizziness.     omeprazole (PRILOSEC) 40 MG capsule Take 1 capsule (40 mg total) by mouth daily. 90 capsule 1   predniSONE (DELTASONE) 5 MG tablet Take 2 tablets (10 mg total) by mouth daily. (Patient taking differently: Take 5 mg by mouth daily.) 60 tablet 0   rosuvastatin (CRESTOR) 5 MG tablet Take 1 tablet (5 mg total) by mouth daily. 30 tablet 0   triamterene-hydrochlorothiazide (MAXZIDE) 75-50 MG tablet Take 0.5 tablets by mouth daily. 90 tablet 3   predniSONE (DELTASONE) 5 MG tablet Take 2 tablets (10 mg total) by mouth daily. (Patient not taking: Reported on 02/04/2023) 60 tablet 0   No current facility-administered medications on file prior to visit.    Allergies:   Allergies  Allergen Reactions   Shellfish Allergy Anaphylaxis and Swelling   Avocado Swelling    Lips   Codeine Other (See Comments)    Constipation   Iodinated Contrast Media Other (See Comments)   Latex Hives    Power gloves   Pineapple Swelling    lip   Penicillins Rash    Reaction: Childhood    Physical Exam General: well developed, well nourished, seated, in no evident distress Head: head normocephalic and atraumatic.   Neck: supple with no carotid or supraclavicular bruits Cardiovascular: regular rate and rhythm, no murmurs Musculoskeletal: no deformity Skin:  no rash/petichiae Vascular:  Normal pulses all extremities  Neurologic Exam Mental Status: Awake and fully alert. Oriented to place and time. Recent and remote memory intact. Attention span, concentration and fund of knowledge appropriate. Mood and affect appropriate.  Cranial Nerves: Fundoscopic exam reveals sharp disc  margins. Pupils equal, briskly reactive to light. Extraocular movements full without nystagmus. Visual fields full to confrontation. Hearing intact. Facial sensation intact. Face, tongue, palate moves normally and symmetrically.  Motor: Normal bulk and tone. Normal strength in all tested extremity muscles. Sensory.: intact to touch , pinprick , position and vibratory sensation.  Coordination: Rapid alternating movements normal in all extremities. Finger-to-nose and heel-to-shin performed accurately bilaterally. Gait and Station: Arises from chair without difficulty. Stance is normal. Gait demonstrates normal stride length and balance . Able to heel, toe and tandem walk without difficulty.  Reflexes: 1+ and symmetric. Toes downgoing.   NIHSS  0 Modified Rankin  0   ASSESSMENT: 62 year old African-American lady with transient episode of altered consciousness and difficulty reading and writing possibly of left hemispheric  TIA and July 2024 with MRI done 6 days later showing silent small right internal capsule acute lacunar infarct.  Vascular risk factors of diabetes, hypertension, hyperlipidemia obesity and at risk for sleep apnea     PLAN:I had a long d/w patient and her husband about her recent TIA and lacunar stroke, risk for recurrent stroke/TIAs, personally independently reviewed imaging studies and stroke evaluation results and answered questions..  I recommend she discontinue aspirin and switch to Plavix 75 mg daily   for secondary stroke prevention and maintain strict control of hypertension with blood pressure goal below 130/90, diabetes with hemoglobin A1c goal below 6.5% and lipids with LDL cholesterol goal below 70 mg/dL. I also advised the patient to eat a healthy diet with plenty of whole grains, cereals, fruits and vegetables, exercise regularly and maintain ideal body weight .check echocardiogram, 30-day heart monitor for paroxysmal A-fib, CT angiogram brain and neck and polysomnogram  for sleep apnea.  Followup in the future with me in 4 months or call earlier if necessary.  Greater than 50% time doing this 45-minute visit spent on counseling and coordination of care about her TIA and silent lacunar stroke and discussion about evaluation and treatment and answering questions.  Delia Heady, MD Note: This document was prepared with digital dictation and possible smart phrase technology. Any transcriptional errors that result from this process are unintentional.

## 2023-02-04 NOTE — Patient Instructions (Signed)
I had a long d/w patient and her husband about her recent TIA and lacunar stroke, risk for recurrent stroke/TIAs, personally independently reviewed imaging studies and stroke evaluation results and answered questions..  I recommend she discontinue aspirin and switch to Plavix 75 mg daily   for secondary stroke prevention and maintain strict control of hypertension with blood pressure goal below 130/90, diabetes with hemoglobin A1c goal below 6.5% and lipids with LDL cholesterol goal below 70 mg/dL. I also advised the patient to eat a healthy diet with plenty of whole grains, cereals, fruits and vegetables, exercise regularly and maintain ideal body weight .check echocardiogram, 30-day heart monitor for paroxysmal A-fib, CT angiogram brain and neck and polysomnogram for sleep apnea.  Followup in the future with me in 4 months or call earlier if necessary.  Stroke Prevention Some medical conditions and behaviors can lead to a higher chance of having a stroke. You can help prevent a stroke by eating healthy, exercising, not smoking, and managing any medical conditions you have. Stroke is a leading cause of functional impairment. Primary prevention is particularly important because a majority of strokes are first-time events. Stroke changes the lives of not only those who experience a stroke but also their family and other caregivers. How can this condition affect me? A stroke is a medical emergency and should be treated right away. A stroke can lead to brain damage and can sometimes be life-threatening. If a person gets medical treatment right away, there is a better chance of surviving and recovering from a stroke. What can increase my risk? The following medical conditions may increase your risk of a stroke: Cardiovascular disease. High blood pressure (hypertension). Diabetes. High cholesterol. Sickle cell disease. Blood clotting disorders (hypercoagulable state). Obesity. Sleep disorders (obstructive  sleep apnea). Other risk factors include: Being older than age 45. Having a history of blood clots, stroke, or mini-stroke (transient ischemic attack, TIA). Genetic factors, such as race, ethnicity, or a family history of stroke. Smoking cigarettes or using other tobacco products. Taking birth control pills, especially if you also use tobacco. Heavy use of alcohol or drugs, especially cocaine and methamphetamine. Physical inactivity. What actions can I take to prevent this? Manage your health conditions High cholesterol levels. Eating a healthy diet is important for preventing high cholesterol. If cholesterol cannot be managed through diet alone, you may need to take medicines. Take any prescribed medicines to control your cholesterol as told by your health care provider. Hypertension. To reduce your risk of stroke, try to keep your blood pressure below 130/80. Eating a healthy diet and exercising regularly are important for controlling blood pressure. If these steps are not enough to manage your blood pressure, you may need to take medicines. Take any prescribed medicines to control hypertension as told by your health care provider. Ask your health care provider if you should monitor your blood pressure at home. Have your blood pressure checked every year, even if your blood pressure is normal. Blood pressure increases with age and some medical conditions. Diabetes. Eating a healthy diet and exercising regularly are important parts of managing your blood sugar (glucose). If your blood sugar cannot be managed through diet and exercise, you may need to take medicines. Take any prescribed medicines to control your diabetes as told by your health care provider. Get evaluated for obstructive sleep apnea. Talk to your health care provider about getting a sleep evaluation if you snore a lot or have excessive sleepiness. Make sure that any other medical conditions  you have, such as atrial  fibrillation or atherosclerosis, are managed. Nutrition Follow instructions from your health care provider about what to eat or drink to help manage your health condition. These instructions may include: Reducing your daily calorie intake. Limiting how much salt (sodium) you use to 1,500 milligrams (mg) each day. Using only healthy fats for cooking, such as olive oil, canola oil, or sunflower oil. Eating healthy foods. You can do this by: Choosing foods that are high in fiber, such as whole grains, and fresh fruits and vegetables. Eating at least 5 servings of fruits and vegetables a day. Try to fill one-half of your plate with fruits and vegetables at each meal. Choosing lean protein foods, such as lean cuts of meat, poultry without skin, fish, tofu, beans, and nuts. Eating low-fat dairy products. Avoiding foods that are high in sodium. This can help lower blood pressure. Avoiding foods that have saturated fat, trans fat, and cholesterol. This can help prevent high cholesterol. Avoiding processed and prepared foods. Counting your daily carbohydrate intake.  Lifestyle If you drink alcohol: Limit how much you have to: 0-1 drink a day for women who are not pregnant. 0-2 drinks a day for men. Know how much alcohol is in your drink. In the [place] one drink equals one 12 oz bottle of beer ( ), one 5 oz glass of wine ( ), or one 1 oz glass of hard liquor (44mL). Do not use any products that contain nicotine or tobacco. These products include cigarettes, chewing tobacco, and vaping devices, such as e-cigarettes. If you need help quitting, ask your health care provider. Avoid secondhand smoke. Do not use drugs. Activity  Try to stay at a healthy weight. Get at least 30 minutes of exercise on most days, such as: Fast walking. Biking. Swimming. Medicines Take over-the-counter and prescription medicines only as told by your health care provider. Aspirin or blood thinners (antiplatelets  or anticoagulants) may be recommended to reduce your risk of forming blood clots that can lead to stroke. Avoid taking birth control pills. Talk to your health care provider about the risks of taking birth control pills if: You are over 15 years old. You smoke. You get very bad headaches. You have had a blood clot. Where to find more information American Stroke Association: www.strokeassociation.org Get help right away if: You or a loved one has any symptoms of a stroke. "BE FAST" is an easy way to remember the main warning signs of a stroke: B - Balance. Signs are dizziness, sudden trouble walking, or loss of balance. E - Eyes. Signs are trouble seeing or a sudden change in vision. F - Face. Signs are sudden weakness or numbness of the face, or the face or eyelid drooping on one side. A - Arms. Signs are weakness or numbness in an arm. This happens suddenly and usually on one side of the body. S - Speech. Signs are sudden trouble speaking, slurred speech, or trouble understanding what people say. T - Time. Time to call emergency services. Write down what time symptoms started. You or a loved one has other signs of a stroke, such as: A sudden, severe headache with no known cause. Nausea or vomiting. Seizure. These symptoms may represent a serious problem that is an emergency. Do not wait to see if the symptoms will go away. Get medical help right away. Call your local emergency services (911 in the [place] Do not drive yourself to the hospital. Summary You can help to prevent a stroke by  eating healthy, exercising, not smoking, limiting alcohol intake, and managing any medical conditions you may have. Do not use any products that contain nicotine or tobacco. These include cigarettes, chewing tobacco, and vaping devices, such as e-cigarettes. If you need help quitting, ask your health care provider. Remember "BE FAST" for warning signs of a stroke. Get help right away if you or a loved one has  any of these signs. This information is not intended to replace advice given to you by your health care provider. Make sure you discuss any questions you have with your health care provider. Document Revised: 05/19/2022 Document Reviewed: 05/19/2022 Elsevier Patient Education  2024 [employer].

## 2023-02-09 ENCOUNTER — Telehealth: Payer: Self-pay | Admitting: Neurology

## 2023-02-09 NOTE — Telephone Encounter (Signed)
Referral for sleep studies sent through [facility] to [facility]. Phone: [phone]

## 2023-02-12 ENCOUNTER — Ambulatory Visit (HOSPITAL_BASED_OUTPATIENT_CLINIC_OR_DEPARTMENT_OTHER): Payer: 59

## 2023-02-12 ENCOUNTER — Ambulatory Visit: Payer: 59 | Admitting: Cardiology

## 2023-02-12 ENCOUNTER — Encounter: Payer: Self-pay | Admitting: Cardiology

## 2023-02-12 VITALS — BP 140/80 | HR 80 | Resp 16 | Ht 64.0 in | Wt 210.0 lb

## 2023-02-12 DIAGNOSIS — Z8673 Personal history of transient ischemic attack (TIA), and cerebral infarction without residual deficits: Secondary | ICD-10-CM | POA: Diagnosis not present

## 2023-02-12 DIAGNOSIS — I700 Atherosclerosis of aorta: Secondary | ICD-10-CM

## 2023-02-12 DIAGNOSIS — I10 Essential (primary) hypertension: Secondary | ICD-10-CM

## 2023-02-12 DIAGNOSIS — I2510 Atherosclerotic heart disease of native coronary artery without angina pectoris: Secondary | ICD-10-CM | POA: Diagnosis not present

## 2023-02-12 DIAGNOSIS — I2584 Coronary atherosclerosis due to calcified coronary lesion: Secondary | ICD-10-CM | POA: Diagnosis not present

## 2023-02-12 DIAGNOSIS — M059 Rheumatoid arthritis with rheumatoid factor, unspecified: Secondary | ICD-10-CM

## 2023-02-12 DIAGNOSIS — I6381 Other cerebral infarction due to occlusion or stenosis of small artery: Secondary | ICD-10-CM

## 2023-02-12 NOTE — Progress Notes (Signed)
ID:  Evelyn Baker, DOB 08-May-1961, MRN [id number]  PCP:  Jarrett Soho, PA-C  Cardiologist:  Tessa Lerner, DO, [facility] (established care 02/12/23):  REASON FOR CONSULT: Coronary calcification noted on nongated CT study/recent TIA  REQUESTING PHYSICIAN:  Jarrett Soho, PA-C [address] [place],  [place] [address]  Chief Complaint  Patient presents with   New Patient (Initial Visit)    Referred by Dr. Marchelle Gearing Coronary calcification, recent stroke.    HPI  Evelyn Baker is a 62 y.o. African-American female who presents to the clinic for evaluation of coronary artery calcification, recent TIA.  At the request of Jarrett Soho, PA-C. Her past medical history and cardiovascular risk factors include: Recent TIA, lacunar stroke (MRI of the brain July 2024), hypothyroidism, hypertension, GERD, rheumatoid arthritis, coronary artery calcification noted on nongated CT study, aortic atherosclerosis.  Patient is referred to the practice for evaluation of coronary artery calcification and recent TIA.  Patient moved from [place] to [place] in 1993 and has been in her usual state of health.  Recently was diagnosed with rheumatoid arthritis and followed up with pulmonary medicine and had a CT scan which noted not only pulmonary findings but also coronary artery calcification and aortic atherosclerosis.  She was referred to cardiology for further evaluation and management.  In the interim, she developed symptoms of stroke and was diagnosed with TIA based on EMR.  She had an MRI of the brain without contrast which noted right internal capsule lacunar infarct, likely acute.  She has followed up with neurology who recommended additional testing with echocardiogram and cardiac monitor.  She is here to establish care.  Currently denies anginal chest pain or heart failure symptoms.  Started walking last week about 1 mile every other day and no exertional chest pain or shortness of  breath.  Patient states that she feels better after walking.  No family history of premature coronary artery disease or sudden cardiac death or cardiomyopathy.  Patient is accompanied by her husband at today's office visit also provides collateral history.  FUNCTIONAL STATUS: Prior to walking a week ago every other day 1 mile.  CARDIAC DATABASE: EKG: 02/12/2023: Sinus rhythm, 72 bpm, consider old anteroseptal infarct, without underlying ischemia injury pattern  Echocardiogram: No results found for this or any previous visit from the past 1095 days.   Stress Testing: No results found for this or any previous visit from the past 1095 days.    RADIOLOGY: CT chest without contrast 09/08/2022 1. 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 Further clinical evaluation is recommended. Strictly speaking, interstitial lung disease is not entirely excluded, but not favored on the basis of today's examination. Repeat high-resolution chest CT should be considered in 12 months to assess for temporal changes in the appearance of the lung parenchyma. 2. Aortic atherosclerosis, in addition to two-vessel coronary artery disease. Please note that although the presence of coronary artery calcium documents the presence of coronary artery disease, the severity of this disease and any potential stenosis cannot be assessed on this non-gated CT examination. Assessment for potential risk factor modification, dietary therapy or pharmacologic therapy may be warranted, if clinically indicated. 3. Trace volume of pericardial fluid and/or thickening, unlikely of hemodynamic significance at this time.   Aortic Atherosclerosis (ICD10-I70.0).  MRI of the brain without contrast 01/22/2023: 1. 12 mm acute/subacute nonhemorrhagic infarct involving the posterior body of the right caudate and  corona  radiata. 2. Otherwise normal MRI appearance the brain. 3. Bilateral mastoid effusions. No obstructing nasopharyngeal lesion is present.  ALLERGIES: Allergies  Allergen Reactions   Shellfish Allergy Anaphylaxis and Swelling   Avocado Swelling    Lips   Codeine Other (See Comments)    Constipation   Iodinated Contrast Media Other (See Comments)   Latex Hives    Power gloves   Pineapple Swelling    lip   Penicillins Rash    Reaction: Childhood    MEDICATION LIST PRIOR TO VISIT: Current Meds  Medication Sig   albuterol (VENTOLIN HFA) 108 (90 Base) MCG/ACT inhaler Inhale 2 puffs into the lungs every 4 (four) hours as needed for wheezing or shortness of breath.   azathioprine (IMURAN) 100 MG tablet Take 100 mg by mouth daily.   clopidogrel (PLAVIX) 75 MG tablet Take 1 tablet (75 mg total) by mouth daily.   EPINEPHrine 0.3 mg/0.3 mL IJ SOAJ injection Inject 0.3 mg into the muscle as needed for anaphylaxis.   levothyroxine (SYNTHROID) 125 MCG tablet Take 1 tablet (125 mcg total) by mouth in the morning on an empty stomach.   predniSONE (DELTASONE) 5 MG tablet Take 2 tablets (10 mg total) by mouth daily. (Patient taking differently: Take 5 mg by mouth daily.)   rosuvastatin (CRESTOR) 5 MG tablet Take 1 tablet (5 mg total) by mouth daily.   triamterene-hydrochlorothiazide (MAXZIDE) 75-50 MG tablet Take 0.5 tablets by mouth daily.     PAST MEDICAL HISTORY: Past Medical History:  Diagnosis Date   Asthma    Depression    Dyspnea    Hyperlipidemia    Hypertension    Hypothyroidism    Thyroid disease     PAST SURGICAL HISTORY: Past Surgical History:  Procedure Laterality Date   FOOT SURGERY     STRABISMUS SURGERY     TOTAL ABDOMINAL HYSTERECTOMY     VEIN LIGATION AND STRIPPING Left 01/22/2021   Procedure: LEFT GREATER SAPHENOUS VEIN  LIGATION AND STRIPPING;  Surgeon: Sherren Kerns, MD;  Location: [facility] OR;  Service: Vascular;  Laterality: Left;    FAMILY HISTORY: The  patient family history includes Heart failure in her mother; Kidney failure in her mother.  SOCIAL HISTORY:  The patient  reports that she has never smoked. She has never been exposed to tobacco smoke. She has never used smokeless tobacco. She reports that she does not drink alcohol and does not use drugs.  REVIEW OF SYSTEMS: Review of Systems  Cardiovascular:  Negative for chest pain, claudication, dyspnea on exertion, irregular heartbeat, leg swelling, near-syncope, orthopnea, palpitations, paroxysmal nocturnal dyspnea and syncope.  Respiratory:  Negative for shortness of breath.   Hematologic/Lymphatic: Negative for bleeding problem.  Musculoskeletal:  Negative for muscle cramps and myalgias.  Neurological:  Negative for dizziness and light-headedness.    PHYSICAL EXAM:    02/12/2023    1:51 PM 02/12/2023    1:08 PM 02/04/2023    9:27 AM  Vitals with BMI  Height  5\' 4"  5\' 4"   Weight  210 lbs 211 lbs  BMI  36.03 36.2  Systolic 140 177 161  Diastolic 80 83 87  Pulse 80 88 103    Physical Exam  Constitutional: No distress.  Age appropriate, hemodynamically stable.   Neck: No JVD present.  Cardiovascular: Normal rate, regular rhythm, S1 normal, S2 normal and intact distal pulses. Exam reveals no gallop, no S3 and no S4.  No murmur heard. Pulses:      Dorsalis  pedis pulses are 2+ on the right side and 2+ on the left side.       Posterior tibial pulses are 1+ on the right side and 1+ on the left side.  Pulmonary/Chest: Effort normal and breath sounds normal. No stridor. She has no wheezes. She has no rales.  Abdominal: Soft. Bowel sounds are normal. She exhibits no distension. There is no abdominal tenderness.  Musculoskeletal:        General: No edema.     Cervical back: Neck supple.  Neurological: She is alert and oriented to person, place, and time. She has intact cranial nerves (2-12).  Skin: Skin is warm and moist.     LABORATORY DATA:    Latest Ref Rng & Units  01/22/2021    8:08 AM  CBC  Hemoglobin 12.0 - 15.0 g/dL 65.7   Hematocrit 84.6 - 46.0 % 38.0        Latest Ref Rng & Units 01/22/2021    8:08 AM  CMP  Glucose 70 - 99 mg/dL 962   BUN 6 - 20 mg/dL 18   Creatinine 9.52 - 1.00 mg/dL 8.41   Sodium 324 - 401 mmol/L 142   Potassium 3.5 - 5.1 mmol/L 3.3   Chloride 98 - 111 mmol/L 104     No results found for: "CHOL", "HDL", "LDLCALC", "LDLDIRECT", "TRIG", "CHOLHDL" No components found for: "NTPROBNP" No results for input(s): "PROBNP" in the last 8760 hours. No results for input(s): "TSH" in the last 8760 hours.  BMP No results for input(s): "NA", "K", "CL", "CO2", "GLUCOSE", "BUN", "CREATININE", "CALCIUM", "GFRNONAA", "GFRAA" in the last 8760 hours.  HEMOGLOBIN A1C No results found for: "HGBA1C", "MPG"  UUV:OZDGU Panel w/reflex   - Collection Date 01/21/2022 01/09/2021 12/28/2019  Collection Time 08:51 AM 09:18 AM 08:41 AM  Order Date 01/21/2022 01/09/2021 12/28/2019  Result: ldl 133 LDL 127, ASCVD 3%    Cholesterol 201    H (Ref Range: <200 mg/dL) 440 (Ref Range: <347 mg/dL) 425 (Ref Range: <956 mg/dL)  TRIG 387 (Ref Range: 0-199 mg/dL) 564 (Ref Range: 3-329 mg/dL) 518 (Ref Range: 8-416 mg/dL)  HDLD 47 (Ref Range: 60-63 mg/dL) 47 (Ref Range: 01-60 mg/dL) 43 (Ref Range: 10-93 mg/dL)  Calc LDL 235    H (Ref Range: 0-99 mg/dL) 573    H (Ref Range: 2-20 mg/dL) 254    H (Ref Range: 2-70 mg/dL)  Non-HDL 623    H (Ref Range: 0-129 mg/dL) 762    H (Ref Range: 8-315 mg/dL) 176 (Ref Range: 1-607 mg/dL)  Chol/HDL ratio 4.3    H (Ref Range: 2.0-4.0 Ratio) 4.1    H (Ref Range: 2.0-4.0 Ratio) 4.0 (Ref Range: 2.0-4.0 Ratio)  Notes: WHARTON,COURTNEY 01/21/2022 04:30:47 PM > LDL up a little, HDL good. Overall a little worse over last 2 yrs, would consider low dose medication to help lower your cholesterol, crestor 5mg  once daily, will recheck in 6 mos WHARTON,COURTNEY 01/09/2021 12:29:31 PM > LDL a little high but HDL is good.  Work on diet/exercise and we can monitor it [facility] 12/28/2019 05:26:35 PM > better! Maintainhealthy diet/activity, HDL a little lower   External Labs: Collected: 02/02/2023 available in Care Everywhere. Total cholesterol 150, triglycerides 72, HDL 55, LDL calculated 82, non-HDL 96  IMPRESSION:    ICD-10-CM   1. Coronary atherosclerosis due to calcified coronary lesion  I25.10 ECHOCARDIOGRAM COMPLETE BUBBLE STUDY   I25.84 [facility] MYOCARDIAL PERFUSION WO LEXISCAN    2. Primary hypertension  I10 EKG 12-Lead  3. Hx-TIA (transient ischemic attack)  Z86.73 LONG TERM MONITOR (3-14 DAYS)    PCV MYOCARDIAL PERFUSION WO LEXISCAN    4. Lacunar infarction (HCC)  I63.81 LONG TERM MONITOR (3-14 DAYS)    PCV MYOCARDIAL PERFUSION WO LEXISCAN    5. Aortic atherosclerosis (HCC)  I70.0 PCV MYOCARDIAL PERFUSION WO LEXISCAN    6. Benign hypertension  I10     7. Rheumatoid arthritis with positive rheumatoid factor, involving unspecified site [facility])  M05.9        RECOMMENDATIONS: KESHANDA FRESHLEY is a 62 y.o. African-American female whose past medical history and cardiac risk factors include: Recent TIA, lacunar stroke (MRI of the brain July 2024), hypothyroidism, hypertension, GERD, rheumatoid arthritis, coronary artery calcification noted on nongated CT study, aortic atherosclerosis.  Coronary atherosclerosis due to calcified coronary lesion Incidentally noted on nongated CT study Neurology recently transitioned her to Plavix given the recent TIA/lacunar stroke on imaging.  Continue with Plavix Recently started on statin therapy 02/02/2023 -recommend repeat fasting lipids in 6 weeks patient to arrange either with PCP or our practice. Plan echo with bubble study given the recent TIA/stroke. Exercise nuclear stress test to evaluate for reversible ischemia and functional capacity.  EKG is interpretable pharmacological stress is requested due to intermediate/high risk pretest probability. Reemphasized  the importance of secondary prevention with focus on improving her modifiable cardiovascular risk factors such as glycemic control, lipid management, blood pressure control, weight loss.  Primary hypertension Office blood pressures are not at goal. She did not take her blood pressure medications this morning prior to coming in. Patient states that she takes half a tablet of triamterene/hydrochlorothiazide and SBP's around 131/83 at home I have asked her to keep a log of her blood pressures. Reemphasized importance of low-salt diet  Hx-TIA (transient ischemic attack) Lacunar infarction [facility]) Follows with neurology. Reemphasized the importance of secondary prevention with focus on improving her modifiable cardiovascular risk factors such as glycemic control, lipid management, blood pressure control, weight loss.  Data Reviewed: I have independently reviewed external notes provided by the referring provider as part of this office visit.   I have independently reviewed results of prior records, EKG, CT and MRI reports, outside labs available in Care Everywhere as part of medical decision making. I have ordered the following tests:  Orders Placed This Encounter  Procedures   LONG TERM MONITOR (3-14 DAYS)    Standing Status:   Future    Order Specific Question:   Where should this test be performed?    Answer:   [facility]    Order Specific Question:   Does the patient have an implanted cardiac device?    Answer:   No    Order Specific Question:   Prescribed days of wear    Answer:   88    Order Specific Question:   Type of enrollment    Answer:   Clinic Enrollment   [facility] MYOCARDIAL PERFUSION WO LEXISCAN    Standing Status:   Future    Standing Expiration Date:   02/12/2024   EKG 12-Lead   ECHOCARDIOGRAM COMPLETE BUBBLE STUDY    Standing Status:   Future    Standing Expiration Date:   02/12/2024    Order Specific Question:   Where should this test be performed    Answer:   [facility]    Order Specific Question:   Perflutren DEFINITY (image enhancing agent) should be administered unless hypersensitivity or allergy exist    Answer:   Administer  Perflutren    Order Specific Question:   Reason for exam    Answer:   TIA (transient ischemic attack) 435.9 / G45.9   I have not made medications changes at today's encounter as noted above. History of present illness was obtained by both patient and husband at today's office visit.   FINAL MEDICATION LIST END OF ENCOUNTER: No orders of the defined types were placed in this encounter.   Medications Discontinued During This Encounter  Medication Reason   aspirin EC 81 MG tablet    azathioprine (IMURAN) 100 MG tablet    predniSONE (DELTASONE) 5 MG tablet    omeprazole (PRILOSEC) 40 MG capsule    meclizine (ANTIVERT) 25 MG tablet    budesonide-formoterol (SYMBICORT) 160-4.5 MCG/ACT inhaler    beclomethasone (QVAR) 40 MCG/ACT inhaler      Current Outpatient Medications:    albuterol (VENTOLIN HFA) 108 (90 Base) MCG/ACT inhaler, Inhale 2 puffs into the lungs every 4 (four) hours as needed for wheezing or shortness of breath., Disp: , Rfl:    azathioprine (IMURAN) 100 MG tablet, Take 100 mg by mouth daily., Disp: , Rfl:    clopidogrel (PLAVIX) 75 MG tablet, Take 1 tablet (75 mg total) by mouth daily., Disp: 30 tablet, Rfl: 11   EPINEPHrine 0.3 mg/0.3 mL IJ SOAJ injection, Inject 0.3 mg into the muscle as needed for anaphylaxis., Disp: , Rfl:    levothyroxine (SYNTHROID) 125 MCG tablet, Take 1 tablet (125 mcg total) by mouth in the morning on an empty stomach., Disp: 30 tablet, Rfl: 5   predniSONE (DELTASONE) 5 MG tablet, Take 2 tablets (10 mg total) by mouth daily. (Patient taking differently: Take 5 mg by mouth daily.), Disp: 60 tablet, Rfl: 0   rosuvastatin (CRESTOR) 5 MG tablet, Take 1 tablet (5 mg total) by mouth daily., Disp: 30 tablet, Rfl: 0   triamterene-hydrochlorothiazide (MAXZIDE) 75-50 MG tablet, Take 0.5 tablets by  mouth daily., Disp: 90 tablet, Rfl: 3  Orders Placed This Encounter  Procedures   LONG TERM MONITOR (3-14 DAYS)   PCV MYOCARDIAL PERFUSION WO LEXISCAN   EKG 12-Lead   ECHOCARDIOGRAM COMPLETE BUBBLE STUDY    There are no Patient Instructions on file for this visit.   --Continue cardiac medications as reconciled in final medication list. --Return in about 6 weeks (around 03/26/2023) for Follow up, Coronary artery calcification. or sooner if needed. --Continue follow-up with your primary care physician regarding the management of your other chronic comorbid conditions.  Patient's questions and concerns were addressed to her satisfaction. She voices understanding of the instructions provided during this encounter.   This note was created using a voice recognition software as a result there may be grammatical errors inadvertently enclosed that do not reflect the nature of this encounter. Every attempt is made to correct such errors.  Tessa Lerner, [place], [facility]  Pager:  [phone] Office: [phone]

## 2023-02-13 ENCOUNTER — Ambulatory Visit (HOSPITAL_BASED_OUTPATIENT_CLINIC_OR_DEPARTMENT_OTHER)
Admission: RE | Admit: 2023-02-13 | Discharge: 2023-02-13 | Disposition: A | Discharge status: Home / Self Care | Payer: 59 | Source: Ambulatory Visit | Attending: Neurology | Admitting: Neurology

## 2023-02-13 DIAGNOSIS — G9389 Other specified disorders of brain: Secondary | ICD-10-CM | POA: Diagnosis not present

## 2023-02-13 DIAGNOSIS — E041 Nontoxic single thyroid nodule: Secondary | ICD-10-CM | POA: Diagnosis not present

## 2023-02-13 DIAGNOSIS — I672 Cerebral atherosclerosis: Secondary | ICD-10-CM | POA: Diagnosis not present

## 2023-02-13 DIAGNOSIS — I6522 Occlusion and stenosis of left carotid artery: Secondary | ICD-10-CM | POA: Diagnosis not present

## 2023-02-13 DIAGNOSIS — I6381 Other cerebral infarction due to occlusion or stenosis of small artery: Secondary | ICD-10-CM | POA: Insufficient documentation

## 2023-02-13 MED ORDER — IOHEXOL 350 MG/ML SOLN
80.0000 mL | Freq: Once | INTRAVENOUS | Status: AC | PRN
  Administered 2023-02-13: 80 mL via INTRAVENOUS

## 2023-02-17 ENCOUNTER — Ambulatory Visit: Payer: 59

## 2023-02-17 DIAGNOSIS — Z8673 Personal history of transient ischemic attack (TIA), and cerebral infarction without residual deficits: Secondary | ICD-10-CM

## 2023-02-17 DIAGNOSIS — I6381 Other cerebral infarction due to occlusion or stenosis of small artery: Secondary | ICD-10-CM

## 2023-02-18 ENCOUNTER — Other Ambulatory Visit (HOSPITAL_COMMUNITY): Payer: Self-pay

## 2023-02-18 DIAGNOSIS — R21 Rash and other nonspecific skin eruption: Secondary | ICD-10-CM | POA: Diagnosis not present

## 2023-02-18 DIAGNOSIS — Z79899 Other long term (current) drug therapy: Secondary | ICD-10-CM | POA: Diagnosis not present

## 2023-02-18 DIAGNOSIS — M0579 Rheumatoid arthritis with rheumatoid factor of multiple sites without organ or systems involvement: Secondary | ICD-10-CM | POA: Diagnosis not present

## 2023-02-18 DIAGNOSIS — R700 Elevated erythrocyte sedimentation rate: Secondary | ICD-10-CM | POA: Diagnosis not present

## 2023-02-18 MED ORDER — ROSUVASTATIN CALCIUM 5 MG PO TABS
5.0000 mg | ORAL_TABLET | Freq: Every day | ORAL | 3 refills | Status: DC
  Filled 2023-02-18: qty 90, 90d supply, fill #0

## 2023-02-27 ENCOUNTER — Other Ambulatory Visit (HOSPITAL_COMMUNITY): Payer: Self-pay

## 2023-03-02 NOTE — Progress Notes (Signed)
Kindly inform the patient that transcranial Doppler study shows significant narrowing of one of the blood vessels on the left side towards the front and the brain.  This may have been responsible for the TIA episode she had.  Continue medical management for now but if she has recurrent similar symptoms may consider angioplasty stenting in the future.

## 2023-03-03 ENCOUNTER — Telehealth: Payer: Self-pay

## 2023-03-03 ENCOUNTER — Other Ambulatory Visit (HOSPITAL_COMMUNITY): Payer: Self-pay

## 2023-03-03 ENCOUNTER — Telehealth: Payer: Self-pay | Admitting: Neurology

## 2023-03-03 DIAGNOSIS — I10 Essential (primary) hypertension: Secondary | ICD-10-CM

## 2023-03-03 MED ORDER — AZATHIOPRINE 100 MG PO TABS
100.0000 mg | ORAL_TABLET | Freq: Every day | ORAL | 0 refills | Status: DC
  Filled 2023-03-03: qty 90, 90d supply, fill #0

## 2023-03-03 NOTE — Telephone Encounter (Signed)
-----   Message from Delia Heady sent at 03/02/2023  9:04 PM EDT ----- Evelyn Baker inform the patient that transcranial Doppler study shows significant narrowing of one of the blood vessels on the left side towards the front and the brain.  This may have been responsible for the TIA episode she had.  Continue medical management for now but if she has recurrent similar symptoms may consider angioplasty stenting in the future.

## 2023-03-03 NOTE — Telephone Encounter (Signed)
Patient called to inform us that her bp has been high for the past 2 weeks. Patient mention her bp has been around 160-172/90-102. Patient mention you told her you would change her bp medication if she continues to see you. Patient would like for you to help her with her bp.

## 2023-03-03 NOTE — Telephone Encounter (Signed)
Called the patient and reviewed the TCD results. Advised for now Dr Pearlean Brownie recommends monitoring and treating medication management. Discussed risk factors for stroke and to ensure she is staying on top of those. Pt verbalized understanding.

## 2023-03-05 ENCOUNTER — Other Ambulatory Visit (HOSPITAL_COMMUNITY): Payer: Self-pay

## 2023-03-05 ENCOUNTER — Encounter: Payer: Self-pay | Admitting: Neurology

## 2023-03-05 ENCOUNTER — Ambulatory Visit: Payer: 59

## 2023-03-05 DIAGNOSIS — I2510 Atherosclerotic heart disease of native coronary artery without angina pectoris: Secondary | ICD-10-CM

## 2023-03-05 DIAGNOSIS — I2584 Coronary atherosclerosis due to calcified coronary lesion: Secondary | ICD-10-CM | POA: Diagnosis not present

## 2023-03-05 DIAGNOSIS — I6381 Other cerebral infarction due to occlusion or stenosis of small artery: Secondary | ICD-10-CM

## 2023-03-05 DIAGNOSIS — Z8673 Personal history of transient ischemic attack (TIA), and cerebral infarction without residual deficits: Secondary | ICD-10-CM

## 2023-03-05 DIAGNOSIS — I700 Atherosclerosis of aorta: Secondary | ICD-10-CM | POA: Diagnosis not present

## 2023-03-05 MED ORDER — CHLORTHALIDONE 25 MG PO TABS
25.0000 mg | ORAL_TABLET | Freq: Every morning | ORAL | 0 refills | Status: AC
Start: 2023-03-05 — End: 2023-04-04
  Filled 2023-03-05: qty 30, 30d supply, fill #0

## 2023-03-05 MED ORDER — VALSARTAN 80 MG PO TABS
80.0000 mg | ORAL_TABLET | Freq: Every day | ORAL | 0 refills | Status: AC
Start: 2023-03-05 — End: 2023-04-04
  Filled 2023-03-05: qty 30, 30d supply, fill #0

## 2023-03-05 NOTE — Telephone Encounter (Signed)
Spoke to the patient today when she came in for ancillary testing.  Reviewed her blood pressure log and numbers are still elevated with SBP higher than 140 mmHg consistently.  Patient is currently on triamterene/hydrochlorothiazide combination.  Will discontinue triamterene/hydrochlorothiazide for now.  Will start chlorthalidone 25 mg p.o. every morning and valsartan 80 mg p.o. every afternoon.  Blood work in 1 week to reevaluate renal function and electrolytes.  Tessa Lerner, [place], [facility]  Pager:  [phone] Office: [phone]

## 2023-03-06 DIAGNOSIS — I6381 Other cerebral infarction due to occlusion or stenosis of small artery: Secondary | ICD-10-CM | POA: Diagnosis not present

## 2023-03-06 DIAGNOSIS — Z8673 Personal history of transient ischemic attack (TIA), and cerebral infarction without residual deficits: Secondary | ICD-10-CM | POA: Diagnosis not present

## 2023-03-16 ENCOUNTER — Other Ambulatory Visit: Payer: 59

## 2023-03-16 DIAGNOSIS — D3132 Benign neoplasm of left choroid: Secondary | ICD-10-CM | POA: Diagnosis not present

## 2023-03-16 DIAGNOSIS — I10 Essential (primary) hypertension: Secondary | ICD-10-CM | POA: Diagnosis not present

## 2023-03-16 DIAGNOSIS — H33331 Multiple defects of retina without detachment, right eye: Secondary | ICD-10-CM | POA: Diagnosis not present

## 2023-03-16 DIAGNOSIS — H40013 Open angle with borderline findings, low risk, bilateral: Secondary | ICD-10-CM | POA: Diagnosis not present

## 2023-03-16 DIAGNOSIS — H25813 Combined forms of age-related cataract, bilateral: Secondary | ICD-10-CM | POA: Diagnosis not present

## 2023-03-17 ENCOUNTER — Ambulatory Visit (HOSPITAL_COMMUNITY)
Admission: RE | Admit: 2023-03-17 | Discharge: 2023-03-17 | Disposition: A | Discharge status: Home / Self Care | Payer: 59 | Source: Ambulatory Visit | Attending: Cardiology | Admitting: Cardiology

## 2023-03-17 DIAGNOSIS — I639 Cerebral infarction, unspecified: Secondary | ICD-10-CM | POA: Diagnosis not present

## 2023-03-17 DIAGNOSIS — I2510 Atherosclerotic heart disease of native coronary artery without angina pectoris: Secondary | ICD-10-CM | POA: Diagnosis not present

## 2023-03-17 DIAGNOSIS — I2584 Coronary atherosclerosis due to calcified coronary lesion: Secondary | ICD-10-CM

## 2023-03-17 DIAGNOSIS — Z8673 Personal history of transient ischemic attack (TIA), and cerebral infarction without residual deficits: Secondary | ICD-10-CM | POA: Diagnosis not present

## 2023-03-17 LAB — ECHOCARDIOGRAM COMPLETE BUBBLE STUDY
AR max vel: 2.16 cm2
AV Area VTI: 2.14 cm2
AV Area mean vel: 2.08 cm2
AV Mean grad: 3 mmHg
AV Peak grad: 6.5 mmHg
Ao pk vel: 1.27 m/s
Area-P 1/2: 3.91 cm2
S' Lateral: 2.2 cm

## 2023-03-17 LAB — MAGNESIUM: Magnesium: 2.1 mg/dL (ref 1.6–2.3)

## 2023-03-17 LAB — BASIC METABOLIC PANEL
BUN/Creatinine Ratio: 16 (ref 9–23)
BUN: 16 mg/dL (ref 6–24)
CO2: 30 mmol/L — ABNORMAL HIGH (ref 20–29)
Calcium: 9.1 mg/dL (ref 8.7–10.2)
Chloride: 97 mmol/L (ref 96–106)
Creatinine, Ser: 1.02 mg/dL — ABNORMAL HIGH (ref 0.57–1.00)
Glucose: 112 mg/dL — ABNORMAL HIGH (ref 70–99)
Potassium: 3.1 mmol/L — ABNORMAL LOW (ref 3.5–5.2)
Sodium: 140 mmol/L (ref 134–144)
eGFR: 63 mL/min/{1.73_m2} (ref >59)

## 2023-03-17 NOTE — Progress Notes (Signed)
*  PRELIMINARY RESULTS* Echocardiogram 2D Echocardiogram has been performed.  Evelyn Baker 03/17/2023, 12:09 PM

## 2023-03-19 ENCOUNTER — Other Ambulatory Visit: Payer: Self-pay | Admitting: Cardiology

## 2023-03-19 ENCOUNTER — Other Ambulatory Visit (HOSPITAL_COMMUNITY): Payer: Self-pay

## 2023-03-19 DIAGNOSIS — I6381 Other cerebral infarction due to occlusion or stenosis of small artery: Secondary | ICD-10-CM | POA: Diagnosis not present

## 2023-03-19 DIAGNOSIS — Z8673 Personal history of transient ischemic attack (TIA), and cerebral infarction without residual deficits: Secondary | ICD-10-CM | POA: Diagnosis not present

## 2023-03-19 DIAGNOSIS — E876 Hypokalemia: Secondary | ICD-10-CM

## 2023-03-19 MED ORDER — POTASSIUM CHLORIDE ER 10 MEQ PO CPCR
10.0000 meq | ORAL_CAPSULE | Freq: Two times a day (BID) | ORAL | 0 refills | Status: DC
Start: 2023-03-19 — End: 2023-04-21
  Filled 2023-03-19: qty 60, 30d supply, fill #0

## 2023-03-20 ENCOUNTER — Other Ambulatory Visit (HOSPITAL_COMMUNITY): Payer: Self-pay

## 2023-03-24 ENCOUNTER — Encounter: Payer: Self-pay | Admitting: Neurology

## 2023-03-25 ENCOUNTER — Other Ambulatory Visit (HOSPITAL_COMMUNITY): Payer: Self-pay

## 2023-03-25 DIAGNOSIS — Z79899 Other long term (current) drug therapy: Secondary | ICD-10-CM | POA: Diagnosis not present

## 2023-03-25 DIAGNOSIS — R700 Elevated erythrocyte sedimentation rate: Secondary | ICD-10-CM | POA: Diagnosis not present

## 2023-03-25 DIAGNOSIS — R21 Rash and other nonspecific skin eruption: Secondary | ICD-10-CM | POA: Diagnosis not present

## 2023-03-25 DIAGNOSIS — M0579 Rheumatoid arthritis with rheumatoid factor of multiple sites without organ or systems involvement: Secondary | ICD-10-CM | POA: Diagnosis not present

## 2023-03-25 MED ORDER — LEVOTHYROXINE SODIUM 125 MCG PO TABS
125.0000 ug | ORAL_TABLET | Freq: Every morning | ORAL | 1 refills | Status: DC
  Filled 2023-03-25: qty 90, 90d supply, fill #0
  Filled 2023-06-09: qty 90, 90d supply, fill #1

## 2023-03-30 ENCOUNTER — Other Ambulatory Visit: Payer: Self-pay | Admitting: Cardiology

## 2023-03-30 ENCOUNTER — Ambulatory Visit: Payer: 59 | Admitting: Cardiology

## 2023-03-30 ENCOUNTER — Other Ambulatory Visit (HOSPITAL_COMMUNITY): Payer: Self-pay

## 2023-03-30 ENCOUNTER — Other Ambulatory Visit: Payer: Self-pay

## 2023-03-30 DIAGNOSIS — I10 Essential (primary) hypertension: Secondary | ICD-10-CM

## 2023-03-30 MED ORDER — VALSARTAN 80 MG PO TABS
80.0000 mg | ORAL_TABLET | Freq: Every day | ORAL | 3 refills | Status: DC
Start: 2023-03-30 — End: 2023-04-14
  Filled 2023-03-30: qty 90, 90d supply, fill #0

## 2023-03-30 MED ORDER — PREDNISONE 5 MG PO TABS
10.0000 mg | ORAL_TABLET | Freq: Every day | ORAL | 0 refills | Status: DC
  Filled 2023-03-30: qty 60, 30d supply, fill #0

## 2023-03-30 MED ORDER — CHLORTHALIDONE 25 MG PO TABS
25.0000 mg | ORAL_TABLET | Freq: Every morning | ORAL | 3 refills | Status: DC
Start: 2023-03-30 — End: 2023-10-12
  Filled 2023-03-30: qty 90, 90d supply, fill #0
  Filled 2023-06-09: qty 90, 90d supply, fill #1
  Filled 2023-09-21: qty 90, 90d supply, fill #2

## 2023-04-03 ENCOUNTER — Telehealth: Payer: Self-pay | Admitting: Cardiology

## 2023-04-03 DIAGNOSIS — E876 Hypokalemia: Secondary | ICD-10-CM

## 2023-04-03 NOTE — Telephone Encounter (Signed)
Pt aware and will come in Monday 10?7 for repeat BMET ./cy  She was suppose to have repeat labs after starting potassium supplements.   Sunit Naranja, DO, [facility]

## 2023-04-03 NOTE — Telephone Encounter (Signed)
Patient is returning phone call.  °

## 2023-04-06 ENCOUNTER — Other Ambulatory Visit: Payer: Self-pay | Admitting: Cardiology

## 2023-04-06 DIAGNOSIS — E876 Hypokalemia: Secondary | ICD-10-CM | POA: Diagnosis not present

## 2023-04-06 LAB — BASIC METABOLIC PANEL
BUN/Creatinine Ratio: 21 (ref 12–28)
BUN: 21 mg/dL (ref 8–27)
CO2: 24 mmol/L (ref 20–29)
Calcium: 9.6 mg/dL (ref 8.7–10.3)
Chloride: 101 mmol/L (ref 96–106)
Creatinine, Ser: 0.98 mg/dL (ref 0.57–1.00)
Glucose: 100 mg/dL — ABNORMAL HIGH (ref 70–99)
Potassium: 3.3 mmol/L — ABNORMAL LOW (ref 3.5–5.2)
Sodium: 140 mmol/L (ref 134–144)
eGFR: 66 mL/min/{1.73_m2} (ref >59)

## 2023-04-08 ENCOUNTER — Encounter: Payer: Self-pay | Admitting: Neurology

## 2023-04-09 NOTE — Telephone Encounter (Signed)
Called the patient and there was no answer Left a detailed message advising that I work along with Dr Pearlean Brownie and can help in reviewing and help answer any questions that she may have. I have her scheduled with Dr Pearlean Brownie for a visit in February which we will keep on the books for now. Informed the pt to call back.

## 2023-04-14 ENCOUNTER — Ambulatory Visit: Payer: 59 | Attending: Cardiology | Admitting: Cardiology

## 2023-04-14 ENCOUNTER — Other Ambulatory Visit (HOSPITAL_COMMUNITY): Payer: Self-pay

## 2023-04-14 ENCOUNTER — Encounter: Payer: Self-pay | Admitting: Cardiology

## 2023-04-14 VITALS — BP 138/62 | HR 81 | Resp 16 | Ht 64.0 in | Wt 207.0 lb

## 2023-04-14 DIAGNOSIS — I6381 Other cerebral infarction due to occlusion or stenosis of small artery: Secondary | ICD-10-CM

## 2023-04-14 DIAGNOSIS — I700 Atherosclerosis of aorta: Secondary | ICD-10-CM | POA: Diagnosis not present

## 2023-04-14 DIAGNOSIS — E876 Hypokalemia: Secondary | ICD-10-CM | POA: Diagnosis not present

## 2023-04-14 DIAGNOSIS — M059 Rheumatoid arthritis with rheumatoid factor, unspecified: Secondary | ICD-10-CM

## 2023-04-14 DIAGNOSIS — Z8673 Personal history of transient ischemic attack (TIA), and cerebral infarction without residual deficits: Secondary | ICD-10-CM

## 2023-04-14 DIAGNOSIS — I2510 Atherosclerotic heart disease of native coronary artery without angina pectoris: Secondary | ICD-10-CM | POA: Diagnosis not present

## 2023-04-14 DIAGNOSIS — I10 Essential (primary) hypertension: Secondary | ICD-10-CM | POA: Diagnosis not present

## 2023-04-14 DIAGNOSIS — I2584 Coronary atherosclerosis due to calcified coronary lesion: Secondary | ICD-10-CM | POA: Diagnosis not present

## 2023-04-14 MED ORDER — VALSARTAN 160 MG PO TABS
160.0000 mg | ORAL_TABLET | Freq: Every day | ORAL | 0 refills | Status: DC
Start: 2023-04-14 — End: 2023-08-11
  Filled 2023-04-14: qty 30, 30d supply, fill #0
  Filled 2023-06-09: qty 30, 30d supply, fill #1
  Filled 2023-07-13: qty 30, 30d supply, fill #2

## 2023-04-14 NOTE — Progress Notes (Signed)
Cardiology Office Note:  .   Date:  04/14/2023  ID:  Evelyn Baker, DOB [birth year], MRN [id number] PCP:  Jarrett Soho, PA-C  Former Cardiology Providers: None   HeartCare Providers Cardiologist:  Tessa Lerner, DO , [facility] (established care 02/12/2023) Electrophysiologist:  None  Click to update primary MD,subspecialty MD or APP then REFRESH:1}    Chief Complaint  Patient presents with   Follow-up    Review test results  Coronary artery calcification BP med changes.     History of Present Illness: .   Evelyn Baker is a 62 y.o. African-American female whose past medical history and cardiovascular risk factors includes: Recent TIA, lacunar stroke (MRI of the brain July 2024), hypothyroidism, hypertension, GERD, rheumatoid arthritis, coronary artery calcification noted on nongated CT study, aortic atherosclerosis.   Patient was referred to the practice given the degree of coronary artery calcification and recent TIA.  Patient recently had a CT of the chest and was noted to have both pulmonary and cardiac findings.  She had coronary artery and aortic atherosclerosis on nongated CT study and referred to cardiology for further evaluation management.  In the interim she develops stroke symptoms and was diagnosed with TIA at her prior ER visit.  During the stroke workup she had an MRI of the brain which noted a right internal capsule lacunar infarct.  Neurology recommended echocardiogram and cardiac monitor for further evaluation.  At the last office visit the shared decision was to proceed with echocardiogram with bubbles given the recent TIA, stress test evaluate for reversible ischemia, and to further uptitrate blood pressure medications.  Her triamterene/hydrochlorothiazide was discontinued and transition to chlorthalidone and valsartan.  Since last office visit, patient denies any anginal chest pain or heart failure symptoms.  No recent strokelike symptoms.  Reviewed the  results of the echo, stress test, and monitor with her and her husband in detail at today's office visit.  Her home blood pressures are still between 130-140 mmHg as per the log that she provides.  Review of Systems: .   Review of Systems  Cardiovascular:  Negative for chest pain, claudication, dyspnea on exertion, irregular heartbeat, leg swelling, near-syncope, orthopnea, palpitations, paroxysmal nocturnal dyspnea and syncope.  Respiratory:  Negative for shortness of breath.   Hematologic/Lymphatic: Negative for bleeding problem.  Musculoskeletal:  Negative for muscle cramps and myalgias.  Neurological:  Negative for dizziness and light-headedness.    Studies Reviewed:   EKG: 02/12/2023: Sinus rhythm, 72 bpm, consider old anteroseptal infarct, without underlying ischemia injury pattern  Echocardiogram: 03/17/2023: LVEF 55 to 60%, grade 1 diastolic dysfunction, right ventricular size and function normal, trivial MR, estimated RAP 8 mmHg, agitated saline contrast negative for interatrial shunt  Stress Testing: Exercise Sestamibi stress test 03/05/2023: Exercise nuclear stress test was performed using Bruce protocol.  1 Day Rest and Stress images. Exercise time 4 minutes 30 seconds on Bruce protocol, achieved 6.53 METS, 99 % of APMHR. Stress ECG Equivocal for ischemia (peak stress ECG uninterpretable due to motion, however, 1 minute into recovery ST-T changes does not illustrate ischemia).  Normal myocardial perfusion with breast attenuation artifact.  No obvious evidence of reversible myocardial ischemia or prior infarct. Left ventricular size normal, wall thickness preserved, no obvious regional wall motion abnormalities.  Low risk study.   Cardiac monitor (Zio Patch): 02/17/2023 - 03/03/2023 Dominant rhythm sinus, followed by tachycardia (burden 16%). Heart rate 48-167 bpm.  Avg HR 85 bpm. No atrial fibrillation detected during the monitoring period. No supraventricular tachycardia,  high grade AV block, pauses (3 seconds or longer). One asymptomatic episode of NSVT, 02/26/2023, at 4:56 AM, 8 beats, 3.3 seconds, max heart rate 167 bpm, average HR 154 bpm. Total supraventricular ectopic burden <1%. Total ventricular ectopic burden <1%. Patient triggered events: 0.  RADIOLOGY: CT chest without contrast 09/08/2022 1. 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 Further clinical evaluation is recommended. Strictly speaking, interstitial lung disease is not entirely excluded, but not favored on the basis of today's examination. Repeat high-resolution chest CT should be considered in 12 months to assess for temporal changes in the appearance of the lung parenchyma. 2. Aortic atherosclerosis, in addition to two-vessel coronary artery disease. Please note that although the presence of coronary artery calcium documents the presence of coronary artery disease, the severity of this disease and any potential stenosis cannot be assessed on this non-gated CT examination. Assessment for potential risk factor modification, dietary therapy or pharmacologic therapy may be warranted, if clinically indicated. 3. Trace volume of pericardial fluid and/or thickening, unlikely of hemodynamic significance at this time.   Aortic Atherosclerosis (ICD10-I70.0).   MRI of the brain without contrast 01/22/2023: 1. 12 mm acute/subacute nonhemorrhagic infarct involving the posterior body of the right caudate and corona radiata. 2. Otherwise normal MRI appearance the brain. 3. Bilateral mastoid effusions. No obstructing nasopharyngeal lesion is present.  Risk Assessment/Calculations:   NA   Labs:       Latest Ref Rng & Units 01/22/2021    8:08 AM  CBC  Hemoglobin 12.0 - 15.0 g/dL 29.5   Hematocrit 62.1 - 46.0 % 38.0        Latest Ref Rng & Units 04/06/2023    8:45 AM 03/16/2023     9:46 AM 01/22/2021    8:08 AM  BMP  Glucose 70 - 99 mg/dL 308  657  846   BUN 8 - 27 mg/dL 21  16  18    Creatinine 0.57 - 1.00 mg/dL 9.62  9.52  8.41   BUN/Creat Ratio 12 - 28 21  16     Sodium 134 - 144 mmol/L 140  140  142   Potassium 3.5 - 5.2 mmol/L 3.3  3.1  3.3   Chloride 96 - 106 mmol/L 101  97  104   CO2 20 - 29 mmol/L 24  30    Calcium 8.7 - 10.3 mg/dL 9.6  9.1        Latest Ref Rng & Units 04/06/2023    8:45 AM 03/16/2023    9:46 AM 01/22/2021    8:08 AM  CMP  Glucose 70 - 99 mg/dL 324  401  027   BUN 8 - 27 mg/dL 21  16  18    Creatinine 0.57 - 1.00 mg/dL 2.53  6.64  4.03   Sodium 134 - 144 mmol/L 140  140  142   Potassium 3.5 - 5.2 mmol/L 3.3  3.1  3.3   Chloride 96 - 106 mmol/L 101  97  104   CO2 20 - 29 mmol/L 24  30    Calcium 8.7 - 10.3 mg/dL 9.6  9.1      No results found for: "CHOL", "HDL", "LDLCALC", "LDLDIRECT", "TRIG", "CHOLHDL" No results for input(s): "LIPOA" in the last 8760 hours. No components found for: "NTPROBNP" No results for input(s): "PROBNP" in the last 8760 hours. No results for input(s): "TSH" in the last 8760 hours.    Physical  Exam:    Today's Vitals   04/14/23 1327  BP: 138/62  Pulse: 81  Resp: 16  SpO2: 97%  Weight: 207 lb (93.9 kg)  Height: 5\' 4"  (1.626 m)   Body mass index is 35.53 kg/m. Wt Readings from Last 3 Encounters:  04/14/23 207 lb (93.9 kg)  02/12/23 210 lb (95.3 kg)  02/04/23 211 lb (95.7 kg)    Physical Exam  Constitutional: No distress.  hemodynamically stable  Neck: No JVD present.  Cardiovascular: Normal rate, regular rhythm, S1 normal, S2 normal, intact distal pulses and normal pulses. Exam reveals no gallop, no S3 and no S4.  No murmur heard. Pulmonary/Chest: Effort normal and breath sounds normal. No stridor. She has no wheezes. She has no rales.  Abdominal: Soft. Bowel sounds are normal. She exhibits no distension. There is no abdominal tenderness.  Musculoskeletal:        General: No edema.      Cervical back: Neck supple.  Neurological: She is alert and oriented to person, place, and time. She has intact cranial nerves (2-12).  Skin: Skin is warm and moist.    Impression & Recommendation(s):  Impression:   ICD-10-CM   1. Coronary atherosclerosis due to calcified coronary lesion  I25.10 Comprehensive metabolic panel   E95.28 LDL cholesterol, direct    Lipid panel    2. Aortic atherosclerosis (HCC)  I70.0 Comprehensive metabolic panel    LDL cholesterol, direct    Lipid panel    3. Primary hypertension  I10 valsartan (DIOVAN) 160 MG tablet    4. Hypokalemia  E87.6 Comprehensive metabolic panel    5. Hx-TIA (transient ischemic attack)  Z86.73     6. Lacunar infarction (HCC)  I63.81     7. Rheumatoid arthritis with positive rheumatoid factor, involving unspecified site (HCC)  M05.9        Recommendation(s):  Coronary atherosclerosis due to calcified coronary lesion Aortic atherosclerosis (HCC) Denies anginal chest pain or heart failure symptoms. Noted to have coronary artery calcification on a nongated CT study. Currently on antiplatelet therapy and statin. Since restarting Crestor she has not had her fasting lipids checked. Echocardiogram notes preserved LVEF without any significant valvular heart disease and bubble study negative for PFO. Stress test-low risk Reemphasized the importance of secondary prevention with focus on improving her modifiable cardiovascular risk factors such as glycemic control, lipid management, blood pressure control, weight loss.  Primary hypertension Hypokalemia Office blood pressures are improving. Home blood pressures are also improving, but not at goal We will increase valsartan 160 mg p.o. daily. Labs in 1 week to reevaluate kidney function Prior labs of noted hypokalemia.  She is currently taking Kdur twice a day  Hx-TIA (transient ischemic attack) Lacunar infarction [facility]) MRI findings of a lacunar infarct. Currently on  antiplatelet therapies and statin. Zio patch results did not illustrate atrial fibrillation during the monitoring period. Re emphasize importance of blood pressure management. Reemphasized the importance of secondary prevention with focus on improving her modifiable cardiovascular risk factors such as glycemic control, lipid management, blood pressure control, weight loss.  Orders Placed:  Orders Placed This Encounter  Procedures   Comprehensive metabolic panel    Standing Status:   Future    Standing Expiration Date:   04/13/2024    Order Specific Question:   Has the patient fasted?    Answer:   Yes   LDL cholesterol, direct    Standing Status:   Future    Standing Expiration Date:   04/13/2024  Lipid panel    Standing Status:   Future    Standing Expiration Date:   04/13/2024    Order Specific Question:   Has the patient fasted?    Answer:   Yes    As part of medical decision making results of the echo, stress test, Zio patch results were reviewed independently at today's visit.   Final Medication List:    Meds ordered this encounter  Medications   valsartan (DIOVAN) 160 MG tablet    Sig: Take 1 tablet (160 mg total) by mouth daily.    Dispense:  90 tablet    Refill:  0    Dose increase to 160 mg    Medications Discontinued During This Encounter  Medication Reason   valsartan (DIOVAN) 80 MG tablet Dose change     Current Outpatient Medications:    albuterol (VENTOLIN HFA) 108 (90 Base) MCG/ACT inhaler, Inhale 2 puffs into the lungs every 4 (four) hours as needed for wheezing or shortness of breath., Disp: , Rfl:    azathioprine (IMURAN) 100 MG tablet, Take 100 mg by mouth daily., Disp: , Rfl:    azathioprine (IMURAN) 100 MG tablet, Take 1 tablet (100 mg total) by mouth daily as directed, Disp: 90 tablet, Rfl: 0   chlorthalidone (HYGROTON) 25 MG tablet, Take 1 tablet (25 mg total) by mouth in the morning., Disp: 90 tablet, Rfl: 3   clopidogrel (PLAVIX) 75 MG tablet,  Take 1 tablet (75 mg total) by mouth daily., Disp: 30 tablet, Rfl: 11   EPINEPHrine 0.3 mg/0.3 mL IJ SOAJ injection, Inject 0.3 mg into the muscle as needed for anaphylaxis., Disp: , Rfl:    levothyroxine (SYNTHROID) 125 MCG tablet, Take 1 tablet (125 mcg total) by mouth in the morning on an empty stomach., Disp: 30 tablet, Rfl: 5   levothyroxine (SYNTHROID) 125 MCG tablet, Take 1 tablet (125 mcg total) by mouth every morning on an empty stomach, Disp: 90 tablet, Rfl: 1   potassium chloride (MICRO-K) 10 MEQ CR capsule, Take 1 capsule (10 mEq total) by mouth 2 (two) times daily., Disp: 60 capsule, Rfl: 0   predniSONE (DELTASONE) 5 MG tablet, Take 2 tablets (10 mg total) by mouth daily. (Patient taking differently: Take 5 mg by mouth daily.), Disp: 60 tablet, Rfl: 0   predniSONE (DELTASONE) 5 MG tablet, Take 2 tablets (10 mg total) by mouth daily., Disp: 60 tablet, Rfl: 0   rosuvastatin (CRESTOR) 5 MG tablet, Take 1 tablet (5 mg total) by mouth daily., Disp: 90 tablet, Rfl: 3   valsartan (DIOVAN) 160 MG tablet, Take 1 tablet (160 mg total) by mouth daily., Disp: 90 tablet, Rfl: 0  Consent:   N/A  Disposition:   Follow-up in 6 months as per patient's wishes to address her cardiovascular risk factors.  Her questions and concerns were addressed to her satisfaction. She voices understanding of the recommendations provided during this encounter.    Signed, Tessa Lerner, DO, [facility]  [facility]  [address] [place], [place] [address] 04/14/2023 2:21 PM

## 2023-04-14 NOTE — Patient Instructions (Addendum)
Medication Instructions:  Your physician has recommended you make the following change in your medication:   INCREASE Valsartan to 160 mg   *If you need a refill on your cardiac medications before your next appointment, please call your pharmacy*  Lab Work: FASTING CMP, Direct LDL, and Lipid Panel to be completed in 1 week  Fasting is required.   If you have labs (blood work) drawn today and your tests are completely normal, you will receive your results only by: MyChart Message (if you have MyChart) OR A paper copy in the mail If you have any lab test that is abnormal or we need to change your treatment, we will call you to review the results.  Testing/Procedures: None ordered  Follow-Up: At [facility], you and your health needs are our priority.  As part of our continuing mission to provide you with exceptional heart care, we have created designated Provider Care Teams.  These Care Teams include your primary Cardiologist (physician) and Advanced Practice Providers (APPs -  Physician Assistants and Nurse Practitioners) who all work together to provide you with the care you need, when you need it.  We recommend signing up for the patient portal called "MyChart".  Sign up information is provided on this After Visit Summary.  MyChart is used to connect with patients for Virtual Visits (Telemedicine).  Patients are able to view lab/test results, encounter notes, upcoming appointments, etc.  Non-urgent messages can be sent to your provider as well.   To learn more about what you can do with MyChart, go to [email or web address].    Your next appointment:   6 month(s)  The format for your next appointment:   In Person  Provider:   Tessa Lerner, DO {

## 2023-04-21 ENCOUNTER — Other Ambulatory Visit (HOSPITAL_COMMUNITY): Payer: Self-pay

## 2023-04-21 ENCOUNTER — Other Ambulatory Visit: Payer: Self-pay | Admitting: Cardiology

## 2023-04-21 ENCOUNTER — Ambulatory Visit: Payer: 59 | Attending: Cardiology

## 2023-04-21 DIAGNOSIS — E876 Hypokalemia: Secondary | ICD-10-CM

## 2023-04-21 MED ORDER — POTASSIUM CHLORIDE ER 10 MEQ PO CPCR
10.0000 meq | ORAL_CAPSULE | Freq: Two times a day (BID) | ORAL | 3 refills | Status: DC
Start: 2023-04-21 — End: 2023-08-11
  Filled 2023-04-21: qty 60, 30d supply, fill #0
  Filled 2023-05-19: qty 60, 30d supply, fill #1
  Filled 2023-06-09: qty 60, 30d supply, fill #2
  Filled 2023-07-13: qty 60, 30d supply, fill #3
  Filled 2023-07-27 – 2023-08-05 (×4): qty 60, 30d supply, fill #4

## 2023-04-22 LAB — LIPID PANEL
Chol/HDL Ratio: 2.8 ratio (ref 0.0–4.4)
Cholesterol, Total: 162 mg/dL (ref 100–199)
HDL: 58 mg/dL (ref >39)
LDL Chol Calc (NIH): 91 mg/dL (ref 0–99)
Triglycerides: 66 mg/dL (ref 0–149)
VLDL Cholesterol Cal: 13 mg/dL (ref 5–40)

## 2023-04-22 LAB — LDL CHOLESTEROL, DIRECT: LDL Direct: 92 mg/dL (ref 0–99)

## 2023-04-22 LAB — COMPREHENSIVE METABOLIC PANEL
ALT: 13 [IU]/L (ref 0–32)
AST: 17 [IU]/L (ref 0–40)
Albumin: 4.1 g/dL (ref 3.8–4.9)
Alkaline Phosphatase: 65 [IU]/L (ref 44–121)
BUN/Creatinine Ratio: 19 (ref 12–28)
BUN: 18 mg/dL (ref 8–27)
Bilirubin Total: 0.6 mg/dL (ref 0.0–1.2)
CO2: 27 mmol/L (ref 20–29)
Calcium: 9.6 mg/dL (ref 8.7–10.3)
Chloride: 103 mmol/L (ref 96–106)
Creatinine, Ser: 0.96 mg/dL (ref 0.57–1.00)
Globulin, Total: 3.4 g/dL (ref 1.5–4.5)
Glucose: 95 mg/dL (ref 70–99)
Potassium: 3.8 mmol/L (ref 3.5–5.2)
Sodium: 141 mmol/L (ref 134–144)
Total Protein: 7.5 g/dL (ref 6.0–8.5)
eGFR: 68 mL/min/{1.73_m2} (ref >59)

## 2023-04-26 NOTE — Progress Notes (Signed)
Potassium levels are now within normal limits. Cholesterol has improved from 133 mg/dL from July 20 23 to 92 mg/dL.  Given her comorbidities would recommend a goal LDL of at least 70 mg/dL and if possible 55 mg/dL  Would recommend increasing rosuvastatin to 20 mg p.o. nightly.   If patient is agreeable please order fasting lipids and CMP in 6 weeks to reevaluate therapy  Tessa Lerner, DO, [facility]

## 2023-04-27 ENCOUNTER — Telehealth: Payer: Self-pay

## 2023-04-27 DIAGNOSIS — I700 Atherosclerosis of aorta: Secondary | ICD-10-CM

## 2023-04-27 DIAGNOSIS — I2510 Atherosclerotic heart disease of native coronary artery without angina pectoris: Secondary | ICD-10-CM

## 2023-04-27 MED ORDER — ROSUVASTATIN CALCIUM 10 MG PO TABS: 10.0000 mg | ORAL_TABLET | Freq: Every day | ORAL | 3 refills | Status: DC

## 2023-04-27 NOTE — Telephone Encounter (Signed)
The patient has been notified of the result and verbalized understanding.  All questions (if any) were answered. Frutoso Schatz, RN 04/27/2023 10:32 AM   Patient would like to try increasing Crestor to 10 mg instead of 20 mg and have labs rechecked after that. I will check with Dr. Odis Hollingshead.

## 2023-04-27 NOTE — Telephone Encounter (Signed)
Okay that is fine.   ST

## 2023-04-27 NOTE — Telephone Encounter (Signed)
Prescription has been sent in  

## 2023-04-27 NOTE — Telephone Encounter (Signed)
-----   Message from [facility] sent at 04/26/2023  4:28 PM EDT ----- Potassium levels are now within normal limits. Cholesterol has improved from 133 mg/dL from July 20 23 to 92 mg/dL.  Given her comorbidities would recommend a goal LDL of at least 70 mg/dL and if possible 55 mg/dL  Would recommend increasing rosuvastatin to 20 mg p.o. nightly.   If patient is agreeable please order fasting lipids and CMP in 6 weeks to reevaluate therapy  Tessa Lerner, DO, [facility]

## 2023-05-01 DIAGNOSIS — M0579 Rheumatoid arthritis with rheumatoid factor of multiple sites without organ or systems involvement: Secondary | ICD-10-CM | POA: Diagnosis not present

## 2023-05-04 DIAGNOSIS — Z0271 Encounter for disability determination: Secondary | ICD-10-CM

## 2023-05-05 ENCOUNTER — Other Ambulatory Visit (HOSPITAL_COMMUNITY): Payer: Self-pay

## 2023-05-05 DIAGNOSIS — M549 Dorsalgia, unspecified: Secondary | ICD-10-CM | POA: Diagnosis not present

## 2023-05-05 DIAGNOSIS — M0579 Rheumatoid arthritis with rheumatoid factor of multiple sites without organ or systems involvement: Secondary | ICD-10-CM | POA: Diagnosis not present

## 2023-05-05 DIAGNOSIS — R202 Paresthesia of skin: Secondary | ICD-10-CM | POA: Diagnosis not present

## 2023-05-05 DIAGNOSIS — Z79899 Other long term (current) drug therapy: Secondary | ICD-10-CM | POA: Diagnosis not present

## 2023-05-05 DIAGNOSIS — M3500 Sicca syndrome, unspecified: Secondary | ICD-10-CM | POA: Diagnosis not present

## 2023-05-05 DIAGNOSIS — R21 Rash and other nonspecific skin eruption: Secondary | ICD-10-CM | POA: Diagnosis not present

## 2023-05-05 DIAGNOSIS — R700 Elevated erythrocyte sedimentation rate: Secondary | ICD-10-CM | POA: Diagnosis not present

## 2023-05-05 DIAGNOSIS — M2550 Pain in unspecified joint: Secondary | ICD-10-CM | POA: Diagnosis not present

## 2023-05-05 DIAGNOSIS — R918 Other nonspecific abnormal finding of lung field: Secondary | ICD-10-CM | POA: Diagnosis not present

## 2023-05-05 MED ORDER — PREDNISONE 5 MG PO TABS
2.5000 mg | ORAL_TABLET | Freq: Every day | ORAL | 0 refills | Status: DC
  Filled 2023-05-05: qty 15, 30d supply, fill #0

## 2023-05-12 ENCOUNTER — Other Ambulatory Visit (HOSPITAL_COMMUNITY): Payer: Self-pay

## 2023-05-26 NOTE — Progress Notes (Unsigned)
Office Visit Note  Patient: Evelyn Baker             Date of Birth: [birth year]           MRN: [id number]             PCP: Jarrett Soho, PA-C Referring: Dorisann Frames, MD Visit Date: 05/27/2023 Occupation: @GUAROCC @  Subjective:  Pain in multiple joints  History of Present Illness: Evelyn Baker is a 62 y.o. female seen in consultation per request of Dr. Talmage Nap.  According to the patient her symptoms started in November 2023 when she went to [place].  When she came back she was experiencing left lower extremity pain.  Patient had a previous history of DVT when she was 62 years old and pregnant.  She was concerned that she may have another DVT.  She went to her PCP who did ultrasound of her lower extremity which was negative.  She was also referred to a vein specialist and workup was negative.  She recalls having MRI of her lower extremity which was also negative per patient.  She was referred to an orthopedic surgeon who referred her to Dr. Deanne Coffer, a rheumatologist.  Patient states Dr. Deanne Coffer did extensive workup including x-rays and labs and diagnosed her with rheumatoid arthritis.  He started on prednisone 10 mg p.o. daily for about 4 months.  Patient states she gained weight and also her blood sugar increases while she was on prednisone.  In July 2024 Imuran was added 150 mg p.o. daily and prednisone was decreased to 5 mg p.o. daily.  She states she had GI side effects from Imuran and the dose was reduced to 100 mg p.o. daily.  She has been on the same dose since then.  The dose of prednisone was tapered to 2.5 mg p.o. daily in August 2020 for which she continues.  She has been experiencing pain in her joints since February 2024.  She complains of discomfort in her lower back, bilateral hands, left hip, knees and her left ankle joint.  She has noted swelling in her hands and her left ankle.  She states due to ongoing joint pain she was given Simponi Aria infusion on May 02, 2023.  She  has not noticed any improvement in her symptoms.  She continues to have joint pain but she has not noticed any joint swelling recently.  She states due to her insurance change Dr. Deanne Coffer would not be able to provide care to her.She gives history of dry mouth, shortness of breath, myalgias, arthralgias.  There is no history of oral ulcers, nasal ulcers, malar rash, Raynaud's, photosensitivity or lymphadenopathy.  There is history of rheumatoid arthritis in maternal great aunt.  She is gravida 5, para 4, miscarriage 1.  There is no history of preeclampsia. Patient had a lacunar stroke in July 2024.  She was evaluated by Dr. Pearlean Brownie, neurologist and Dr. Odis Hollingshead (cardiology).  She is on Plavix and statins.  Patient had an echocardiogram which was unremarkable. Patient was evaluated by Dr. Marchelle Gearing in July 2024.  He evaluated the CT scan of the chest and related to previous flu symptoms.  The CT scan of the chest in June of 08/19/2022 was better and close to normal per Dr. Jane Canary notes.  Activities of Daily Living:  Patient reports morning stiffness for 1-2 hours.   Patient Reports nocturnal pain.  Difficulty dressing/grooming: Denies Difficulty climbing stairs: Reports Difficulty getting out of chair: Reports Difficulty using hands for taps, buttons,  cutlery, and/or writing: Reports  Review of Systems  Constitutional:  Positive for fatigue.  HENT:  Positive for mouth dryness. Negative for mouth sores.   Eyes:  Negative for dryness.  Respiratory:  Positive for shortness of breath.        On exertion   Cardiovascular:  Negative for chest pain and palpitations.  Gastrointestinal:  Negative for blood in stool, constipation and diarrhea.  Endocrine: Negative for increased urination.  Genitourinary:  Negative for involuntary urination.  Musculoskeletal:  Positive for joint pain, gait problem, joint pain, joint swelling, myalgias, morning stiffness, muscle tenderness and myalgias. Negative for muscle  weakness.  Skin:  Positive for hair loss. Negative for color change, rash and sensitivity to sunlight.  Allergic/Immunologic: Positive for susceptible to infections.  Neurological:  Negative for dizziness and headaches.  Hematological:  Negative for swollen glands.  Psychiatric/Behavioral:  Positive for depressed mood and sleep disturbance. The patient is not nervous/anxious.     PMFS History:  Patient Active Problem List   Diagnosis Date Noted   Dyspareunia in female 05/05/2022   Abnormal urine 05/05/2022   Chronic constipation 05/05/2022   Fibrocystic disease of breast 05/05/2022   Obesity 05/05/2022   Reactive depression (situational) 05/05/2022   Chronic venous insufficiency 02/13/2020   Leg edema, left 02/13/2020    Past Medical History:  Diagnosis Date   Asthma    Depression    Dyspnea    Hyperlipidemia    Hypertension    Hypothyroidism    Thyroid disease     Family History  Problem Relation Age of Onset   Heart failure Mother    Kidney failure Mother    Stroke Sister    Aneurysm Sister    Heart Problems Brother    Hypertension Son    Hypertension Daughter    Past Surgical History:  Procedure Laterality Date   FOOT SURGERY Left    STRABISMUS SURGERY     age 70   TOTAL ABDOMINAL HYSTERECTOMY     VEIN LIGATION AND STRIPPING Left 01/22/2021   Procedure: LEFT GREATER SAPHENOUS VEIN  LIGATION AND STRIPPING;  Surgeon: Sherren Kerns, MD;  Location: [facility] OR;  Service: Vascular;  Laterality: Left;   Social History   Social History Narrative   Not on file   Immunization History  Administered Date(s) Administered   Influenza-Unspecified 03/30/2014, 07/13/2019     Objective: Vital Signs: BP (!) 137/92 (BP Location: Right Arm, Patient Position: Sitting, Cuff Size: Normal)   Pulse 78   Resp 15   Ht 5' 4.5" (1.638 m)   Wt 205 lb 9.6 oz (93.3 kg)   BMI 34.75 kg/m    Physical Exam Vitals and nursing note reviewed.  Constitutional:      Appearance: She is  well-developed.  HENT:     Head: Normocephalic and atraumatic.  Eyes:     Conjunctiva/sclera: Conjunctivae normal.  Cardiovascular:     Rate and Rhythm: Normal rate and regular rhythm.     Heart sounds: Normal heart sounds.  Pulmonary:     Effort: Pulmonary effort is normal.     Breath sounds: Normal breath sounds.  Abdominal:     General: Bowel sounds are normal.     Palpations: Abdomen is soft.  Musculoskeletal:     Cervical back: Normal range of motion.  Lymphadenopathy:     Cervical: No cervical adenopathy.  Skin:    General: Skin is warm and dry.     Capillary Refill: Capillary refill takes less than 2  seconds.     Comments: Hyperpigmentation was noted over bilateral lower extremities.  No sclerodactyly, telangiectasia or nailbed capillary changes were noted.  Neurological:     Mental Status: She is alert and oriented to person, place, and time.  Psychiatric:        Behavior: Behavior normal.      Musculoskeletal Exam: Cervical spine was in good range of motion.  She had painful limited range of motion of the lumbar spine.  Shoulder joints, elbow joints, wrist joints, MCPs PIPs and DIPs with good range of motion with no synovitis.  Hip joints and knee joints were in good range of motion without any warmth swelling or effusion.  There was no tenderness over ankles or MTPs.  PIP and DIP thickening was noted in her feet.  Bunionectomy scar was noted over the left great toe.  CDAI Exam: CDAI Score: -- Patient Global: --; Provider Global: -- Swollen: --; Tender: -- Joint Exam 05/27/2023   No joint exam has been documented for this visit   There is currently no information documented on the homunculus. Go to the Rheumatology activity and complete the homunculus joint exam.  Investigation: No additional findings.  Imaging: XR Foot 2 Views Left  Result Date: 05/27/2023 Hallux valgus deformity was noted.  Pins were noted in the first metatarsal head.  PIP and DIP narrowing  was noted.  No erosive changes were noted.  No intertarsal, tibiotalar or subtalar joint space narrowing was noted. 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  XR Foot 2 Views Right  Result Date: 05/27/2023 Hallux valgus deformity was noted.  PIP and DIP narrowing was noted.  No intertarsal, tibiotalar or subtalar joint space narrowing was noted.  No erosive changes were noted.  Hammertoes were noted. 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  XR Ankle 2 Views Left  Result Date: 05/27/2023 No tibiotalar or subtalar joint space narrowing was noted.  No erosive changes were noted. Impression: Unremarkable x-rays of the ankle.  XR KNEE 3 VIEW LEFT  Result Date: 05/27/2023 Mild medial compartment narrowing was noted.  Mild patellofemoral narrowing was noted.  No chondrocalcinosis was noted. Impression: These findings are suggestive of mild osteoarthritis and mild chondromalacia patella.  XR KNEE 3 VIEW RIGHT  Result Date: 05/27/2023 Mild medial compartment narrowing was noted.  No patellofemoral narrowing was noted.  No chondrocalcinosis was noted. Impression: These findings suggestive of mild osteoarthritis of the knee.  XR Hand 2 View Left  Result Date: 05/27/2023 CMC, PIP and DIP narrowing was noted.  No MCP, intercarpal or radiocarpal joint space narrowing was noted.  No erosive changes were noted. Impression: These findings are suggestive of early osteoarthritis.  XR Hand 2 View Right  Result Date: 05/27/2023 Mild PIP and DIP narrowing was noted.  No MCP, intercarpal radiocarpal joint space narrowing was noted.  No erosive changes were noted. Impression: Early osteoarthritic changes were noted.   Recent Labs: Lab Results  Component Value Date   HGB 12.9 01/22/2021   NA 141 04/21/2023   K 3.8 04/21/2023   CL 103 04/21/2023   CO2 27 04/21/2023   GLUCOSE 95 04/21/2023   BUN 18 04/21/2023   CREATININE 0.96 04/21/2023    BILITOT 0.6 04/21/2023   ALKPHOS 65 04/21/2023   AST 17 04/21/2023   ALT 13 04/21/2023   PROT 7.5 04/21/2023   ALBUMIN 4.1 04/21/2023   CALCIUM 9.6 04/21/2023   Records reviewed from Dr. Dereck Ligas office: August 06, 2022  TPMT 25.2 (normal) July 04, 2022 RF 54.3, ANA 1: 1280, U1 RNP positive, SSA antibody positive, SSB antibody positive, SCL 70 negative, anti-RNA polymerase III 21 ( weak positive) August 06, 2022 TB Gold negative July 23, 2022 C-reactive protein 2.94, hepatitis B nonreactive, hepatitis C nonreactive, SPEP normal, protein creatinine ratio normal, uric acid 4.6, ANCA negative, MPO negative, HLA-B27 negative MRI of the leg June 16, 2012-mild asymmetric marrow edema with mild left tibial shaft associated periosteal edema suspicious of mild tibial stress syndrome mild subcutaneous edema of the anterior medial aspect of the mild left lower leg without local fluid collection CT chest and abdomen pelvis showed subpleural lung nodules and some reticular changes at the lung bases questionable atelectasis versus scarring  Speciality Comments: No specialty comments available.  Procedures:  No procedures performed Allergies: Shellfish allergy, Avocado, Codeine, Iodinated contrast media, Pineapple, and Penicillins   Assessment / Plan:     Visit Diagnoses: Mixed connective tissue disease (HCC) - Positive RF, positive ANA, positive RNP, positive SSA, positive SSB, anti-RNA polymerase III weak positive, inflammatory arthritis, sicca symptoms -patient gives history of dry mouth, joint pain and intermittent joint swelling.  No synovitis was noted on the examination today.  Patient has been on prednisone since February 2024 with gradual taper.  She is currently on prednisone 2.5 mg p.o. daily.  She has been on Imuran since July 2024.  The dose of Imuran was reduced from 150 to 100 mg p.o. daily per patient due to GI side effects.  She was given a course of IV Simponi Aria infusion on  May 02, 2023.  Patient did not notice any difference with the Simponi Aria infusion.  She continues to have joint pain.  No synovitis was noted today.  I plan to continue Imuran which I discussed with the patient.  I would like for her to taper off prednisone over the next few weeks by taking it every other day.  I would observe the symptoms for now.  I want to hold off Simponi Aria infusion especially with positive ANA.  If patient develops inflammatory arthritis then Orencia or tocilizumab may be a better choice.  I will obtain following labs today.  Indications side effects contraindications of Imuran were discussed again.  A handout was given and consent was taken.  Plan: CK, ANA, Anti-scleroderma antibody, RNP Antibody, Anti-Smith antibody, Sjogrens syndrome-A extractable nuclear antibody, Sjogrens syndrome-B extractable nuclear antibody, Anti-DNA antibody, double-stranded, C3 and C4, Beta-2 glycoprotein antibodies, Cardiolipin antibodies, IgG, IgM, IgA, Lupus Anticoagulant Eval w/Reflex, IgG, IgA, IgM, Protein / creatinine ratio, urine  Baseline Immunosuppressant Therapy Labs TB GOLD August 26 2022 at Dr. Dereck Ligas office   Hepatitis Panel July 23, 2022 Dr. Dereck Ligas office    Immunoglobulins-ending   SPEP July 23, 2022 at Dr. Dereck Ligas office  TPMT August 06, 2022 at Dr. Dereck Ligas office Chest Xray: High-resolution CT on December 26, 2022  Patient was counseled on the purpose, proper use, and adverse effects of azathioprine including risk of infection, nausea, rash, and hair loss.  Also informed that medication can cause discoloration of urine, sweat and tears. Reviewed risk of cancer after long term use.  Discussed risk of myelosupression and reviewed importance of frequent lab work to monitor blood counts.  Standing orders placed. Reviewed drug-drug interactions including contraindication with allopurinol.  Provided patient with educational materials on azathioprine and answered all  questions.  Patient consented to azathioprine.  Will upload consent into the media tab.   High risk medication use -  Imuran 100 mg daily since August 2024.  She was previously on Imuran 150 mg p.o. daily and the dose was reduced due to GI side effects.  I will check labs today.- Plan: CBC with Differential/Platelet, COMPLETE METABOLIC PANEL WITH GFR.  Information on immunization was placed in the AVS.  She was advised to hold Imuran if she develops an infection.  Pain in both hands -she complains of pain and swelling in her bilateral hands.  No swelling was noted.  There was no tenderness over MCPs or PIPs.  Plan: XR Hand 2 View Right, XR Hand 2 View Left, Sedimentation rate, Rheumatoid factor, Cyclic citrul peptide antibody, IgG  Long term (current) use of systemic steroids -patient has been on prednisone 10 mg p.o. daily since February 2020 for which she has been gradually tapering.  I do not see any synovitis on the examination.  She is currently on prednisone 2.5 mg p.o. daily.  I advised her to reduce prednisone to 2.5 mg every other day for the next 2 to 3 weeks and then discontinue.  Chronic pain of both knees -patient complains of pain and swelling in the bilateral knee joints.  No warmth swelling or effusion was noted.  Plan: XR KNEE 3 VIEW RIGHT, XR KNEE 3 VIEW LEFT  Pain in left ankle and joints of left foot -she comp is of intermittent swelling in her left ankle joint.  No synovitis was noted.  Plan: XR Ankle 2 Views Left  Pain in both feet -osteoarthritic changes were noted clinically.  No synovitis was noted.  Left bunionectomy scar was noted.  Plan: XR Foot 2 Views Right, XR Foot 2 Views Left  Primary hypertension-blood pressure was elevated at 137/92 today.  She was advised to monitor blood pressure closely and follow-up with the PCP.  Lacunar stroke [facility]) - July 2024 she was evaluated by Dr. Pearlean Brownie and cardiology.  She is on Plavix.  Dyslipidemia-she is on Crestor.  Chronic venous  insufficiency-eval by vein specialist.  She has hyperpigmentation on bilateral lower extremities.  Other medical problems are listed as follows:  Hypertensive retinopathy of both eyes  Gastroesophageal reflux disease without esophagitis  Other specified hypothyroidism  History of asthma  Daytime somnolence  Reactive depression  History of DVT (deep vein thrombosis)-at age 82 while she was pregnant per patient.  Family history of rheumatoid arthritis-great aunt  Orders: Orders Placed This Encounter  Procedures   XR Hand 2 View Right   XR Hand 2 View Left   XR KNEE 3 VIEW RIGHT   XR KNEE 3 VIEW LEFT   XR Ankle 2 Views Left   XR Foot 2 Views Right   XR Foot 2 Views Left   CBC with Differential/Platelet   COMPLETE METABOLIC PANEL WITH GFR   CK   Sedimentation rate   Rheumatoid factor   Cyclic citrul peptide antibody, IgG   ANA   Anti-scleroderma antibody   RNP Antibody   Anti-Smith antibody   Sjogrens syndrome-A extractable nuclear antibody   Sjogrens syndrome-B extractable nuclear antibody   Anti-DNA antibody, double-stranded   C3 and C4   Beta-2 glycoprotein antibodies   Cardiolipin antibodies, IgG, IgM, IgA   Lupus Anticoagulant Eval w/Reflex   IgG, IgA, IgM   Protein / creatinine ratio, urine   Meds ordered this encounter  Medications   azathioprine (IMURAN) 100 MG tablet    Sig: Take 1 tablet (100 mg total) by mouth daily as directed    Dispense:  90 tablet  Refill:  0    Face-to-face time spent with patient was over 60 minutes. Greater than 50% of time was spent in counseling and coordination of care.  Follow-Up Instructions: Return for MCTD.   Pollyann Savoy, MD  Note - This record has been created using Animal nutritionist.  Chart creation errors have been sought, but may not always  have been located. Such creation errors do not reflect on  the standard of medical care.

## 2023-05-27 ENCOUNTER — Ambulatory Visit: Payer: Medicaid Other

## 2023-05-27 ENCOUNTER — Ambulatory Visit: Payer: Medicaid Other | Attending: Rheumatology | Admitting: Rheumatology

## 2023-05-27 ENCOUNTER — Ambulatory Visit (INDEPENDENT_AMBULATORY_CARE_PROVIDER_SITE_OTHER): Payer: Medicaid Other

## 2023-05-27 ENCOUNTER — Other Ambulatory Visit (HOSPITAL_COMMUNITY): Payer: Self-pay

## 2023-05-27 ENCOUNTER — Encounter: Payer: Self-pay | Admitting: Rheumatology

## 2023-05-27 VITALS — BP 137/92 | HR 78 | Resp 15 | Ht 64.5 in | Wt 205.6 lb

## 2023-05-27 DIAGNOSIS — H35033 Hypertensive retinopathy, bilateral: Secondary | ICD-10-CM

## 2023-05-27 DIAGNOSIS — Z8709 Personal history of other diseases of the respiratory system: Secondary | ICD-10-CM

## 2023-05-27 DIAGNOSIS — M25562 Pain in left knee: Secondary | ICD-10-CM

## 2023-05-27 DIAGNOSIS — G8929 Other chronic pain: Secondary | ICD-10-CM | POA: Diagnosis not present

## 2023-05-27 DIAGNOSIS — M79671 Pain in right foot: Secondary | ICD-10-CM

## 2023-05-27 DIAGNOSIS — M79672 Pain in left foot: Secondary | ICD-10-CM

## 2023-05-27 DIAGNOSIS — Z79899 Other long term (current) drug therapy: Secondary | ICD-10-CM | POA: Diagnosis not present

## 2023-05-27 DIAGNOSIS — E038 Other specified hypothyroidism: Secondary | ICD-10-CM

## 2023-05-27 DIAGNOSIS — M25572 Pain in left ankle and joints of left foot: Secondary | ICD-10-CM | POA: Diagnosis not present

## 2023-05-27 DIAGNOSIS — M79641 Pain in right hand: Secondary | ICD-10-CM | POA: Diagnosis not present

## 2023-05-27 DIAGNOSIS — M351 Other overlap syndromes: Secondary | ICD-10-CM

## 2023-05-27 DIAGNOSIS — Z8261 Family history of arthritis: Secondary | ICD-10-CM

## 2023-05-27 DIAGNOSIS — I6381 Other cerebral infarction due to occlusion or stenosis of small artery: Secondary | ICD-10-CM | POA: Diagnosis not present

## 2023-05-27 DIAGNOSIS — Z7952 Long term (current) use of systemic steroids: Secondary | ICD-10-CM

## 2023-05-27 DIAGNOSIS — Z86718 Personal history of other venous thrombosis and embolism: Secondary | ICD-10-CM

## 2023-05-27 DIAGNOSIS — F329 Major depressive disorder, single episode, unspecified: Secondary | ICD-10-CM

## 2023-05-27 DIAGNOSIS — K219 Gastro-esophageal reflux disease without esophagitis: Secondary | ICD-10-CM

## 2023-05-27 DIAGNOSIS — M25561 Pain in right knee: Secondary | ICD-10-CM

## 2023-05-27 DIAGNOSIS — M79642 Pain in left hand: Secondary | ICD-10-CM

## 2023-05-27 DIAGNOSIS — I872 Venous insufficiency (chronic) (peripheral): Secondary | ICD-10-CM

## 2023-05-27 DIAGNOSIS — I10 Essential (primary) hypertension: Secondary | ICD-10-CM | POA: Diagnosis not present

## 2023-05-27 DIAGNOSIS — E785 Hyperlipidemia, unspecified: Secondary | ICD-10-CM | POA: Diagnosis not present

## 2023-05-27 DIAGNOSIS — R400 Somnolence: Secondary | ICD-10-CM

## 2023-05-27 MED ORDER — AZATHIOPRINE 100 MG PO TABS
100.0000 mg | ORAL_TABLET | Freq: Every day | ORAL | 0 refills | Status: DC
  Filled 2023-05-27: qty 90, 90d supply, fill #0

## 2023-05-27 NOTE — Patient Instructions (Addendum)
Azathioprine Tablets What is this medication? AZATHIOPRINE (ay za THYE oh preen) prevents the body from rejecting an organ transplant. It works by lowering the body's immune system response. This helps the body accept the donor organ. It may also be used to treat rheumatoid arthritis. This medicine may be used for other purposes; ask your health care provider or pharmacist if you have questions. COMMON BRAND NAME(S): Azasan, Imuran What should I tell my care team before I take this medication? They need to know if you have any of these conditions: Infection Kidney disease Liver disease An unusual or allergic reaction to azathioprine, lactose, other medications, foods, dyes, or preservatives Pregnant or trying to get pregnant Breastfeeding How should I use this medication? Take this medication by mouth with a full glass of water. Take it as directed on the prescription label at the same time every day. Keep taking it unless your care team tells you to stop. Keep taking it even if you think you are better. Talk to your care team about the use of this medication in children. Special care may be needed. Overdosage: If you think you have taken too much of this medicine contact a poison control center or emergency room at once. NOTE: This medicine is only for you. Do not share this medicine with others. What if I miss a dose? If you miss a dose, take it as soon as you can. If it is almost time for your next dose, take only that dose. Do not take double or extra doses. What may interact with this medication? Do not take this medication with any of the following: Febuxostat Mercaptopurine This medication may also interact with the following: Allopurinol Aminosalicylates, such as sulfasalazine, mesalamine, balsalazide, and olsalazine Leflunomide Medications called ACE inhibitors, such as benazepril, captopril, enalapril, fosinopril, quinapril, lisinopril, ramipril, and  trandolapril Mycophenolate Sulfamethoxazole; trimethoprim Vaccines Warfarin This list may not describe all possible interactions. Give your health care provider a list of all the medicines, herbs, non-prescription drugs, or dietary supplements you use. Also tell them if you smoke, drink alcohol, or use illegal drugs. Some items may interact with your medicine. What should I watch for while using this medication? Visit your care team for regular checks on your progress. You may need blood work done while you are taking this medication. This medication may increase your risk of getting an infection. Call your care team for advice if you get a fever, chills, sore throat, or other symptoms of a cold or flu. Do not treat yourself. Try to avoid being around people who are sick. Talk to your care team about your risk of cancer. You may be more at risk for certain types of cancer if you take this medication. Talk to your care team if you may be pregnant. This medication can cause serious birth defects if taken during pregnancy. This medication may cause infertility. Talk to your care team if you are concerned about your fertility. What side effects may I notice from receiving this medication? Side effects that you should report to your care team as soon as possible: Allergic reactions--skin rash, itching, hives, swelling of the face, lips, tongue, or throat Change in your skin, such as a new growth, a sore that doesn't heal, or a change in a mole Dizziness, loss of balance or coordination, confusion or trouble speaking Infection--fever, chills, cough, sore throat, wounds that don't heal, pain or trouble when passing urine, general feeling of discomfort or being unwell Low red blood cell level--unusual  weakness or fatigue, dizziness, headache, trouble breathing Unusual bruising or bleeding Side effects that usually do not require medical attention (report to your care team if they continue or are  bothersome): Diarrhea Fatigue Nausea Vomiting This list may not describe all possible side effects. Call your doctor for medical advice about side effects. You may report side effects to FDA at 1-800-FDA-1088. Where should I keep my medication? Keep out of the reach of children and pets. Store at room temperature between 15 and 25 degrees C (59 and 77 degrees F). Protect from light. Get rid of any unused medication after the expiration date. To get rid of medications that are no longer needed or have expired: Take the medication to a medication take-back program. Check with your pharmacy or law enforcement to find a location. If you cannot return the medication, check the label or package insert to see if the medication should be thrown out in the garbage or flushed down the toilet. If you are not sure, ask your care team. If it is safe to put it in the trash, empty the medication out of the container. Mix the medication with cat litter, dirt, coffee grounds, or other unwanted substance. Seal the mixture in a bag or container. Put it in the trash. NOTE: This sheet is a summary. It may not cover all possible information. If you have questions about this medicine, talk to your doctor, pharmacist, or health care provider.  2024 Elsevier/Gold Standard (2021-11-19 00:00:00)  Standing Labs We placed an order today for your standing lab work.   Please have your standing labs drawn in February and every 3 months  Please have your labs drawn 2 weeks prior to your appointment so that the provider can discuss your lab results at your appointment, if possible.  Please note that you may see your imaging and lab results in MyChart before we have reviewed them. We will contact you once all results are reviewed. Please allow our office up to 72 hours to thoroughly review all of the results before contacting the office for clarification of your results.  WALK-IN LAB HOURS  Monday through Thursday from  8:00 am -12:30 pm and 1:00 pm-5:00 pm and Friday from 8:00 am-12:00 pm.  Patients with office visits requiring labs will be seen before walk-in labs.  You may encounter longer than normal wait times. Please allow additional time. Wait times may be shorter on  Monday and Thursday afternoons.  We do not book appointments for walk-in labs. We appreciate your patience and understanding with our staff.   Labs are drawn by [facility]. Please bring your co-pay at the time of your lab draw.  You may receive a bill from [facility] for your lab work.  Please note if you are on Hydroxychloroquine and and an order has been placed for a Hydroxychloroquine level,  you will need to have it drawn 4 hours or more after your last dose.  If you wish to have your labs drawn at another location, please call the office 24 hours in advance so we can fax the orders.  The office is located at [address], [place], [place] [address]   If you have any questions regarding directions or hours of operation,  please call [phone].   As a reminder, please drink plenty of water prior to coming for your lab work. Thanks!  Vaccines You are taking a medication(s) that can suppress your immune system.  The following immunizations are recommended: Flu annually Covid-19  Td/Tdap (tetanus, diphtheria, pertussis) every 10 years Pneumonia (Prevnar 15 then Pneumovax 23 at least 1 year apart.  Alternatively, can take Prevnar 20 without needing additional dose) Shingrix: 2 doses from 4 weeks to 6 months apart  Please check with your PCP to make sure you are up to date. If you have signs or symptoms of an infection or start antibiotics: First, call your PCP for workup of your infection. Hold your medication through the infection, until you complete your antibiotics, and until symptoms resolve if you take the following: Injectable medication (Actemra, Benlysta, Cimzia, Cosentyx, Enbrel, Humira, Kevzara, Orencia, Remicade,  Simponi, Stelara, Taltz, Tremfya) Methotrexate Leflunomide (Arava) Mycophenolate (Cellcept) Harriette Ohara, Olumiant, or Rinvoq

## 2023-05-29 ENCOUNTER — Other Ambulatory Visit (HOSPITAL_COMMUNITY): Payer: Self-pay

## 2023-06-01 ENCOUNTER — Other Ambulatory Visit (HOSPITAL_COMMUNITY): Payer: Self-pay

## 2023-06-01 LAB — COMPLETE METABOLIC PANEL WITH GFR
AG Ratio: 1.1 (calc) (ref 1.0–2.5)
ALT: 10 U/L (ref 6–29)
AST: 15 U/L (ref 10–35)
Albumin: 4.2 g/dL (ref 3.6–5.1)
Alkaline phosphatase (APISO): 64 U/L (ref 37–153)
BUN: 16 mg/dL (ref 7–25)
CO2: 33 mmol/L — ABNORMAL HIGH (ref 20–32)
Calcium: 9.9 mg/dL (ref 8.6–10.4)
Chloride: 101 mmol/L (ref 98–110)
Creat: 0.88 mg/dL (ref 0.50–1.05)
Globulin: 3.7 g/dL (ref 1.9–3.7)
Glucose, Bld: 95 mg/dL (ref 65–99)
Potassium: 3.3 mmol/L — ABNORMAL LOW (ref 3.5–5.3)
Sodium: 140 mmol/L (ref 135–146)
Total Bilirubin: 0.7 mg/dL (ref 0.2–1.2)
Total Protein: 7.9 g/dL (ref 6.1–8.1)
eGFR: 75 mL/min/{1.73_m2} (ref >=60)

## 2023-06-01 LAB — CBC WITH DIFFERENTIAL/PLATELET
Absolute Lymphocytes: 1049 {cells}/uL (ref 850–3900)
Absolute Monocytes: 495 {cells}/uL (ref 200–950)
Basophils Absolute: 12 {cells}/uL (ref 0–200)
Basophils Relative: 0.3 %
Eosinophils Absolute: 160 {cells}/uL (ref 15–500)
Eosinophils Relative: 4.1 %
HCT: 36.9 % (ref 35.0–45.0)
Hemoglobin: 12.2 g/dL (ref 11.7–15.5)
MCH: 28 pg (ref 27.0–33.0)
MCHC: 33.1 g/dL (ref 32.0–36.0)
MCV: 84.8 fL (ref 80.0–100.0)
MPV: 10.7 fL (ref 7.5–12.5)
Monocytes Relative: 12.7 %
Neutro Abs: 2184 {cells}/uL (ref 1500–7800)
Neutrophils Relative %: 56 %
Platelets: 240 10*3/uL (ref 140–400)
RBC: 4.35 10*6/uL (ref 3.80–5.10)
RDW: 17 % — ABNORMAL HIGH (ref 11.0–15.0)
Total Lymphocyte: 26.9 %
WBC: 3.9 10*3/uL (ref 3.8–10.8)

## 2023-06-01 LAB — LUPUS ANTICOAGULANT EVAL W/ REFLEX
PTT-LA Screen: 31 s (ref <=40)
dRVVT: 29 s (ref <=45)

## 2023-06-01 LAB — CARDIOLIPIN ANTIBODIES, IGG, IGM, IGA
Anticardiolipin IgA: <2 [APL'U]/mL (ref <20.0)
Anticardiolipin IgG: 2 [GPL'U]/mL (ref <20.0)
Anticardiolipin IgM: <2 [MPL'U]/mL (ref <20.0)

## 2023-06-01 LAB — CK: Total CK: 71 U/L (ref 29–143)

## 2023-06-01 LAB — CYCLIC CITRUL PEPTIDE ANTIBODY, IGG: Cyclic Citrullin Peptide Ab: <16 U

## 2023-06-01 LAB — ANTI-SMITH ANTIBODY: ENA SM Ab Ser-aCnc: <1 AI

## 2023-06-01 LAB — ANTI-NUCLEAR AB-TITER (ANA TITER)
ANA TITER: 1:40 {titer} — ABNORMAL HIGH
ANA Titer 1: 1:1280 {titer} — ABNORMAL HIGH

## 2023-06-01 LAB — PROTEIN / CREATININE RATIO, URINE
Creatinine, Urine: 90 mg/dL (ref 20–275)
Protein/Creat Ratio: 78 mg/g{creat} (ref 24–184)
Protein/Creatinine Ratio: 0.078 mg/mg{creat} (ref 0.024–0.184)
Total Protein, Urine: 7 mg/dL (ref 5–24)

## 2023-06-01 LAB — IGG, IGA, IGM
IgG (Immunoglobin G), Serum: 2305 mg/dL — ABNORMAL HIGH (ref 600–1640)
IgM, Serum: 165 mg/dL (ref 50–300)
Immunoglobulin A: 400 mg/dL — ABNORMAL HIGH (ref 47–310)

## 2023-06-01 LAB — SEDIMENTATION RATE: Sed Rate: 45 mm/h — ABNORMAL HIGH (ref 0–30)

## 2023-06-01 LAB — ANTI-DNA ANTIBODY, DOUBLE-STRANDED: ds DNA Ab: 3 [IU]/mL

## 2023-06-01 LAB — BETA-2 GLYCOPROTEIN ANTIBODIES
Beta-2 Glyco 1 IgA: <2 U/mL (ref <20.0)
Beta-2 Glyco 1 IgM: <2 U/mL (ref <20.0)
Beta-2 Glyco I IgG: <2 U/mL (ref <20.0)

## 2023-06-01 LAB — ANA: Anti Nuclear Antibody (ANA): POSITIVE — AB

## 2023-06-01 LAB — SJOGRENS SYNDROME-A EXTRACTABLE NUCLEAR ANTIBODY: SSA (Ro) (ENA) Antibody, IgG: >8 AI — AB

## 2023-06-01 LAB — RHEUMATOID FACTOR: Rheumatoid fact SerPl-aCnc: 35 [IU]/mL — ABNORMAL HIGH (ref <14)

## 2023-06-01 LAB — ANTI-SCLERODERMA ANTIBODY: Scleroderma (Scl-70) (ENA) Antibody, IgG: <1 AI

## 2023-06-01 LAB — C3 AND C4
C3 Complement: 108 mg/dL (ref 83–193)
C4 Complement: 21 mg/dL (ref 15–57)

## 2023-06-01 LAB — RNP ANTIBODY: Ribonucleic Protein(ENA) Antibody, IgG: <1 AI

## 2023-06-01 LAB — SJOGRENS SYNDROME-B EXTRACTABLE NUCLEAR ANTIBODY: SSB (La) (ENA) Antibody, IgG: >8 AI — AB

## 2023-06-01 NOTE — Progress Notes (Signed)
CBC unremarkable, potassium is low at 3.3.  Please notify patient and have results forwarded to her PCP.  Sedimentation rate is elevated, rheumatoid factor positive, ANA positive SSA positive, SSB positive, IgG and IgA elevated.  Anti-CCP negative, rest of the ENA panel negative, anticardiolipin antibodies negative, complements normal.  Labs are suggestive of Sjogren's syndrome.  I will discuss results at the follow-up visit.  No change in treatment advised.

## 2023-06-02 ENCOUNTER — Other Ambulatory Visit: Payer: Self-pay

## 2023-06-02 DIAGNOSIS — I2510 Atherosclerotic heart disease of native coronary artery without angina pectoris: Secondary | ICD-10-CM

## 2023-06-02 DIAGNOSIS — I700 Atherosclerosis of aorta: Secondary | ICD-10-CM

## 2023-06-09 ENCOUNTER — Other Ambulatory Visit (HOSPITAL_COMMUNITY): Payer: Self-pay

## 2023-06-26 NOTE — Progress Notes (Signed)
 Office Visit Note  Patient: Evelyn Baker             Date of Birth: 04-22-1961           MRN: [id number]             PCP: Katina Pfeiffer, PA-C Referring: Katina Pfeiffer, PA-C Visit Date: 07/10/2023 Occupation: @GUAROCC @  Subjective:  Follow-up   History of Present Illness: Evelyn Baker is a 62 y.o. female with mixed connective tissue disease, and osteoarthritis.  She returns today after her initial visit on May 27, 2023.  She states for the last 3 weeks she has been having lower back pain which radiates into her left lower extremity.  She states that she was on prednisone  at the time and did not notice any improvement.  She has been taking Tylenol  and also trying massage therapy but the pain has not improved.  She noticed some weakness in her left lower extremity.  She denies any loss of control of bladder and bowels.  There is no joint pain or joint swelling currently.  She continues to be on Imuran  100 mg p.o. daily.  As she gradually tapered prednisone  and now she is completely off prednisone .  She has not noticed any worsening of her symptoms.  She continues to have dry mouth symptoms.    Activities of Daily Living:  Patient reports morning stiffness for 20 minutes.   Patient Reports nocturnal pain.  Difficulty dressing/grooming: Denies Difficulty climbing stairs: Reports Difficulty getting out of chair: Reports Difficulty using hands for taps, buttons, cutlery, and/or writing: Reports  Review of Systems  Constitutional:  Positive for fatigue.  HENT:  Positive for mouth dryness. Negative for mouth sores.   Eyes:  Negative for dryness.  Respiratory:  Positive for shortness of breath.   Cardiovascular:  Negative for chest pain and palpitations.  Gastrointestinal:  Negative for blood in stool, constipation and diarrhea.  Endocrine: Negative for increased urination.  Genitourinary:  Negative for involuntary urination.  Musculoskeletal:  Positive for joint pain,  gait problem, joint pain, joint swelling, myalgias, muscle weakness, morning stiffness and myalgias. Negative for muscle tenderness.  Skin:  Positive for sensitivity to sunlight. Negative for color change, rash and hair loss.  Allergic/Immunologic: Negative for susceptible to infections.  Neurological:  Negative for dizziness and headaches.  Hematological:  Negative for swollen glands.  Psychiatric/Behavioral:  Positive for depressed mood and sleep disturbance. The patient is not nervous/anxious.     PMFS History:  Patient Active Problem List   Diagnosis Date Noted   Dyspareunia in female 05/05/2022   Abnormal urine 05/05/2022   Chronic constipation 05/05/2022   Fibrocystic disease of breast 05/05/2022   Obesity 05/05/2022   Reactive depression (situational) 05/05/2022   Chronic venous insufficiency 02/13/2020   Leg edema, left 02/13/2020    Past Medical History:  Diagnosis Date   Asthma    Depression    Dyspnea    Hyperlipidemia    Hypertension    Hypothyroidism    Thyroid  disease     Family History  Problem Relation Age of Onset   Heart failure Mother    Kidney failure Mother    Stroke Sister    Aneurysm Sister    Heart Problems Brother    Hypertension Son    Hypertension Daughter    Past Surgical History:  Procedure Laterality Date   FOOT SURGERY Left    STRABISMUS SURGERY     age 73   TOTAL ABDOMINAL HYSTERECTOMY  VEIN LIGATION AND STRIPPING Left 01/22/2021   Procedure: LEFT GREATER SAPHENOUS VEIN  LIGATION AND STRIPPING;  Surgeon: Harvey Carlin BRAVO, MD;  Location: [facility] OR;  Service: Vascular;  Laterality: Left;   Social History   Social History Narrative   Not on file   Immunization History  Administered Date(s) Administered   Influenza-Unspecified 03/30/2014, 07/13/2019     Objective: Vital Signs: BP 102/73 (BP Location: Left Arm, Patient Position: Sitting, Cuff Size: Normal)   Pulse 98   Resp 14   Ht 5' 5 (1.651 m)   Wt 209 lb (94.8 kg)   BMI  34.78 kg/m    Physical Exam Vitals and nursing note reviewed.  Constitutional:      Appearance: She is well-developed.  HENT:     Head: Normocephalic and atraumatic.  Eyes:     Conjunctiva/sclera: Conjunctivae normal.  Cardiovascular:     Rate and Rhythm: Normal rate and regular rhythm.     Heart sounds: Normal heart sounds.  Pulmonary:     Effort: Pulmonary effort is normal.     Breath sounds: Normal breath sounds.  Abdominal:     General: Bowel sounds are normal.     Palpations: Abdomen is soft.  Musculoskeletal:     Cervical back: Normal range of motion.  Lymphadenopathy:     Cervical: No cervical adenopathy.  Skin:    General: Skin is warm and dry.     Capillary Refill: Capillary refill takes less than 2 seconds.  Neurological:     Mental Status: She is alert and oriented to person, place, and time.  Psychiatric:        Behavior: Behavior normal.      Musculoskeletal Exam: Cervical spine with good range of motion.  Lumbar spine range of motion was difficult to assess due to discomfort.  Shoulders, elbows, wrist joints, MCPs PIPs and DIPs been good range of motion with no synovitis.  Hip joints were in good range of motion.  She has severe tenderness on palpation over left trochanteric bursa.  Knee joints were in good range of motion without any warmth swelling or effusion.  There was mild tenderness over left ankle joint without any synovitis.  There was no tenderness over MTPs.  CDAI Exam: CDAI Score: -- Patient Global: --; Provider Global: -- Swollen: --; Tender: -- Joint Exam 07/10/2023   No joint exam has been documented for this visit   There is currently no information documented on the homunculus. Go to the Rheumatology activity and complete the homunculus joint exam.  Investigation: No additional findings.  Imaging: No results found.   Recent Labs: Lab Results  Component Value Date   WBC 3.9 05/27/2023   HGB 12.2 05/27/2023   PLT 240 05/27/2023    NA 140 05/27/2023   K 3.3 (L) 05/27/2023   CL 101 05/27/2023   CO2 33 (H) 05/27/2023   GLUCOSE 95 05/27/2023   BUN 16 05/27/2023   CREATININE 0.88 05/27/2023   BILITOT 0.7 05/27/2023   ALKPHOS 65 04/21/2023   AST 15 05/27/2023   ALT 10 05/27/2023   PROT 7.9 05/27/2023   ALBUMIN 4.1 04/21/2023   CALCIUM  9.9 05/27/2023     Speciality Comments: No specialty comments available.  Procedures:  Large Joint Inj: L greater trochanter on 07/10/2023 11:52 AM Indications: pain Details: 27 G 1.5 in needle, lateral approach  Arthrogram: No  Medications: 60 mg triamcinolone  acetonide 40 MG/ML; 1.5 mL lidocaine  1 % Aspirate: 0 mL Outcome: tolerated well, no immediate  complications Procedure, treatment alternatives, risks and benefits explained, specific risks discussed. Consent was given by the patient. Immediately prior to procedure a time out was called to verify the correct patient, procedure, equipment, support staff and site/side marked as required. Patient was prepped and draped in the usual sterile fashion.     Allergies: Shellfish allergy, Avocado, Codeine , Iodinated contrast media, Pineapple, and Penicillins   Assessment / Plan:     Visit Diagnoses: Mixed connective tissue disease (HCC) - Positive ANA, positive SSA, positive SSB, positive RF, inflammatory arthritis and sicca symptoms:dxd Dr. Curt 07/24.  Patient had positive U1 RNP and anti-RNA polymerase 3 peak positive in the past.  She gives history of dry mouth, arthralgias and intermittent joint swelling.  She had been tapering prednisone  and came off prednisone  completely few weeks ago.  She continues to be on Imuran  100 mg p.o. daily which she is tolerating well.  She denies any side effects from current dose of Imuran .  She complains of severe lower back pain radiating into her left lower extremity.  On the examination today she had tenderness over her left trochanteric bursa.  She had no point tenderness over the lumbar spine or  gluteal region.  She had painful range of motion of her lumbar spine.May 27, 2023 urine protein creatinine ratio normal, ANA 1: 1280 NS, 1: 40 cytoplasmic, SSA> 8.0, SSB>8.0, dsDNA negative, Smith negative, RNP negative, C3-C4 normal, anticardiolipin negative, beta-2  GP 1 negative, lupus anticoagulant not detected, RF 35, anti-CCP negative, ESR 45, CK 71, IgG 2305, IgA 400, IgM normal.  Labs obtained at the last visit were reviewed with the patient and her husband.  Patient was on on prednisone  10 mg p.o. daily since February 2020 which she completely tapered.  I will obtain hemoglobin A1c with next labs  High risk medication use - Imuran  100 mg p.o. daily (since August 2024) dose was reduced from 150 mg daily due to GI side effects.  (Previous treatment Simponi Aria infusions for RA Dr. DELENA - Plan: CBC with Differential/Platelet, COMPLETE METABOLIC PANEL WITH GFR in February and every 3 months.  Information for immunization was placed in the AVS.  Screening for diabetes mellitus - Plan: Hemoglobin A1c with next labs.  Pain in both hands -no synovitis was noted.  X-ray obtained at the last visit showed early osteoarthritic changes.  X-ray findings were reviewed with the patient.  Trochanteric bursitis, left hip-she has severe tenderness on palpation over left trochanteric bursa.  Patient is also complains of nocturnal pain when she sleeps on the left side.  I believe her symptoms are coming from her trochanteric bursitis.  After informed consent was obtained and different treatment options were discussed left trochanteric bursa was injected with lidocaine  and Kenalog  as described above.  Patient tolerated the procedure well.  A handout on exercises was given.  Chronic pain of both knees -she is off-and-on discomfort in her knee joints.  No warmth swelling or effusion was noted.  X-ray obtained at the last visit showed mild osteoarthritis and mild chondromalacia patella.  Pain in left ankle and joints  of left foot - History of intermittent swelling.  No synovitis was noted.  X-rays were unremarkable.  Primary osteoarthritis of both feet - Clinical and radiographic findings were suggestive of osteoarthritis.  Chronic midline low back pain without sciatica-patient complains of discomfort in her lower back.  I believe the left-sided discomfort she is having is from trochanteric bursitis.  Plan: XR Lumbar Spine 2-3 Views.  X-rays showed  L5-S1 narrowing.  Core strengthening exercises were demonstrated in the office.  Primary hypertension-blood pressure was normal at 102/73.  She was advised to monitor blood pressure after the cortisone injection.  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  She is under care of neurology and cardiology.  Dyslipidemia  Chronic venous insufficiency  Hypertensive retinopathy of both eyes  Gastroesophageal reflux disease without esophagitis  Other specified hypothyroidism  Daytime somnolence  Reactive depression  History of asthma  History of DVT (deep vein thrombosis) - At age 17 during pregnancy.  Family history of rheumatoid arthritis-great aunt  Orders: Orders Placed This Encounter  Procedures   Large Joint Inj   XR Lumbar Spine 2-3 Views   CBC with Differential/Platelet   COMPLETE METABOLIC PANEL WITH GFR   Hemoglobin A1c   No orders of the defined types were placed in this encounter.    Follow-Up Instructions: Return in about 3 months (around 10/08/2023) for MCTD.   Maya Nash, MD  Note - This record has been created using Animal nutritionist.  Chart creation errors have been sought, but may not always  have been located. Such creation errors do not reflect on  the standard of medical care.

## 2023-06-30 DIAGNOSIS — K529 Noninfective gastroenteritis and colitis, unspecified: Secondary | ICD-10-CM | POA: Diagnosis not present

## 2023-07-03 ENCOUNTER — Other Ambulatory Visit (HOSPITAL_COMMUNITY): Payer: Self-pay

## 2023-07-07 ENCOUNTER — Other Ambulatory Visit (HOSPITAL_COMMUNITY): Payer: Self-pay

## 2023-07-08 ENCOUNTER — Other Ambulatory Visit (HOSPITAL_COMMUNITY): Payer: Self-pay

## 2023-07-08 ENCOUNTER — Encounter (HOSPITAL_COMMUNITY): Payer: Self-pay

## 2023-07-08 MED ORDER — ALBUTEROL SULFATE HFA 108 (90 BASE) MCG/ACT IN AERS
2.0000 | INHALATION_SPRAY | RESPIRATORY_TRACT | 3 refills | Status: DC | PRN
  Filled 2023-07-08: qty 18, 30d supply, fill #0

## 2023-07-09 ENCOUNTER — Other Ambulatory Visit (HOSPITAL_COMMUNITY): Payer: Self-pay

## 2023-07-10 ENCOUNTER — Encounter: Payer: Self-pay | Admitting: Rheumatology

## 2023-07-10 ENCOUNTER — Ambulatory Visit: Payer: Medicaid Other | Attending: Rheumatology | Admitting: Rheumatology

## 2023-07-10 ENCOUNTER — Ambulatory Visit (INDEPENDENT_AMBULATORY_CARE_PROVIDER_SITE_OTHER): Payer: Medicaid Other

## 2023-07-10 VITALS — BP 102/73 | HR 98 | Resp 14 | Ht 65.0 in | Wt 209.0 lb

## 2023-07-10 DIAGNOSIS — E038 Other specified hypothyroidism: Secondary | ICD-10-CM

## 2023-07-10 DIAGNOSIS — Z8709 Personal history of other diseases of the respiratory system: Secondary | ICD-10-CM

## 2023-07-10 DIAGNOSIS — M79642 Pain in left hand: Secondary | ICD-10-CM

## 2023-07-10 DIAGNOSIS — M351 Other overlap syndromes: Secondary | ICD-10-CM

## 2023-07-10 DIAGNOSIS — E785 Hyperlipidemia, unspecified: Secondary | ICD-10-CM | POA: Diagnosis not present

## 2023-07-10 DIAGNOSIS — M19071 Primary osteoarthritis, right ankle and foot: Secondary | ICD-10-CM

## 2023-07-10 DIAGNOSIS — M545 Low back pain, unspecified: Secondary | ICD-10-CM

## 2023-07-10 DIAGNOSIS — M7062 Trochanteric bursitis, left hip: Secondary | ICD-10-CM

## 2023-07-10 DIAGNOSIS — M25561 Pain in right knee: Secondary | ICD-10-CM | POA: Diagnosis not present

## 2023-07-10 DIAGNOSIS — Z8261 Family history of arthritis: Secondary | ICD-10-CM

## 2023-07-10 DIAGNOSIS — M79641 Pain in right hand: Secondary | ICD-10-CM

## 2023-07-10 DIAGNOSIS — Z131 Encounter for screening for diabetes mellitus: Secondary | ICD-10-CM | POA: Diagnosis not present

## 2023-07-10 DIAGNOSIS — G8929 Other chronic pain: Secondary | ICD-10-CM

## 2023-07-10 DIAGNOSIS — I6381 Other cerebral infarction due to occlusion or stenosis of small artery: Secondary | ICD-10-CM

## 2023-07-10 DIAGNOSIS — R400 Somnolence: Secondary | ICD-10-CM

## 2023-07-10 DIAGNOSIS — Z79899 Other long term (current) drug therapy: Secondary | ICD-10-CM | POA: Diagnosis not present

## 2023-07-10 DIAGNOSIS — M5442 Lumbago with sciatica, left side: Secondary | ICD-10-CM | POA: Diagnosis not present

## 2023-07-10 DIAGNOSIS — Z7952 Long term (current) use of systemic steroids: Secondary | ICD-10-CM

## 2023-07-10 DIAGNOSIS — M19072 Primary osteoarthritis, left ankle and foot: Secondary | ICD-10-CM

## 2023-07-10 DIAGNOSIS — Z86718 Personal history of other venous thrombosis and embolism: Secondary | ICD-10-CM

## 2023-07-10 DIAGNOSIS — H35033 Hypertensive retinopathy, bilateral: Secondary | ICD-10-CM

## 2023-07-10 DIAGNOSIS — I10 Essential (primary) hypertension: Secondary | ICD-10-CM

## 2023-07-10 DIAGNOSIS — I872 Venous insufficiency (chronic) (peripheral): Secondary | ICD-10-CM

## 2023-07-10 DIAGNOSIS — M25572 Pain in left ankle and joints of left foot: Secondary | ICD-10-CM | POA: Diagnosis not present

## 2023-07-10 DIAGNOSIS — K219 Gastro-esophageal reflux disease without esophagitis: Secondary | ICD-10-CM

## 2023-07-10 DIAGNOSIS — M25562 Pain in left knee: Secondary | ICD-10-CM

## 2023-07-10 DIAGNOSIS — F329 Major depressive disorder, single episode, unspecified: Secondary | ICD-10-CM

## 2023-07-10 MED ORDER — TRIAMCINOLONE ACETONIDE 40 MG/ML IJ SUSP
60.0000 mg | INTRAMUSCULAR | Status: AC | PRN
  Administered 2023-07-10: 60 mg via INTRA_ARTICULAR

## 2023-07-10 MED ORDER — LIDOCAINE HCL 1 % IJ SOLN
1.5000 mL | INTRAMUSCULAR | Status: AC | PRN
  Administered 2023-07-10: 1.5 mL

## 2023-07-10 NOTE — Patient Instructions (Addendum)
 Standing Labs We placed an order today for your standing lab work.   Please have your standing labs drawn in February and every 3 months  Please have your labs drawn 2 weeks prior to your appointment so that the provider can discuss your lab results at your appointment, if possible.  Please note that you may see your imaging and lab results in MyChart before we have reviewed them. We will contact you once all results are reviewed. Please allow our office up to 72 hours to thoroughly review all of the results before contacting the office for clarification of your results.  WALK-IN LAB HOURS  Monday through Thursday from 8:00 am -12:30 pm and 1:00 pm-5:00 pm and Friday from 8:00 am-12:00 pm.  Patients with office visits requiring labs will be seen before walk-in labs.  You may encounter longer than normal wait times. Please allow additional time. Wait times may be shorter on  Monday and Thursday afternoons.  We do not book appointments for walk-in labs. We appreciate your patience and understanding with our staff.   Labs are drawn by [facility]. Please bring your co-pay at the time of your lab draw.  You may receive a bill from [facility] for your lab work.  Please note if you are on Hydroxychloroquine and and an order has been placed for a Hydroxychloroquine level,  you will need to have it drawn 4 hours or more after your last dose.  If you wish to have your labs drawn at another location, please call the office 24 hours in advance so we can fax the orders.  The office is located at [address], [address], [place], [place] [address]   If you have any questions regarding directions or hours of operation,  please call [phone].   As a reminder, please drink plenty of water prior to coming for your lab work. Thanks!   Vaccines You are taking a medication(s) that can suppress your immune system.  The following immunizations are recommended: Flu annually Covid-19  RSV Td/Tdap (tetanus,  diphtheria, pertussis) every 10 years Pneumonia (Prevnar 15 then Pneumovax 23 at least 1 year apart.  Alternatively, can take Prevnar 20 without needing additional dose) Shingrix: 2 doses from 4 weeks to 6 months apart  Please check with your PCP to make sure you are up to date.   If you have signs or symptoms of an infection or start antibiotics: First, call your PCP for workup of your infection. Hold your medication through the infection, until you complete your antibiotics, and until symptoms resolve if you take the following: Injectable medication (Actemra, Benlysta, Cimzia, Cosentyx, Enbrel, Humira, Kevzara, Orencia, Remicade, Simponi, Stelara, Taltz, Tremfya) Methotrexate Leflunomide (Arava) Mycophenolate (Cellcept) Earma, Olumiant, or Rinvoq   Hip Bursitis  Hip bursitis is swelling of one or more fluid-filled sacs (bursae) in your hip joint. If the bursa becomes irritated, it can fill with extra fluid and become swollen. This condition can cause pain, and your symptoms may come and go over time. What are the causes? Repeated use of your hip muscles. Injury to the hip. Weak butt muscles. Bone spurs. Infection. In some cases, the cause may not be known. What increases the risk? Having a past hip injury or hip surgery. Having a condition, such as arthritis, gout, diabetes, or thyroid  disease. Having spine problems. Having one leg that is shorter than the other. Running a lot or doing long-distance running. Playing sports where there is a risk of injury or falling, such as football,  martial arts, or skiing. What are the signs or symptoms? Symptoms may come and go, and they often include: Pain in the hip or groin area. Pain may get worse when you move your hip. Tenderness and swelling of the hip. In rare cases, the bursa may become infected. If this happens, you may: Get a fever. Have warmth and redness in the hip area. How is this treated? This condition is treated  by: Resting your hip. Icing your hip. Wrapping the hip area with an elastic bandage (compression wrap). Keeping the hip raised. Other treatments may include: Using crutches, a cane, or a walker. Medicines. Draining fluid out of the bursa. Surgery to take out a bursa. This is rare. Long-term treatment may include: Doing exercises to help your strength and flexibility. Lifestyle changes like losing weight to lessen the strain on your hip. Follow these instructions at home: Managing pain, stiffness, and swelling     If told, put ice on the painful area. To do this: Put ice in a plastic bag. Place a towel between your skin and the bag. Leave the ice on for 20 minutes, 2-3 times a day. Take off the ice if your skin turns bright red. This is very important. If you cannot feel pain, heat, or cold, you have a greater risk of damage to the area. Raise your hip by putting a pillow under your hips while you lie down. Stop if you feel pain. If told, put heat on the affected area. Do this as often as told by your doctor. Use the heat source that your doctor recommends, such as a moist heat pack or a heating pad. Place a towel between your skin and the heat source. Leave the heat on for 20-30 minutes. Take off the heat if your skin turns bright red. This is very important. If you cannot feel pain, heat, or cold, you have a greater risk of getting burned. Activity Do not use your hip to support your body weight until your doctor says that you can. Use crutches, a cane, or a walker as told by your doctor. If the affected leg is one that you use to drive, ask your doctor if it is safe to drive. Rest and protect your hip as much as you can until you feel better. Return to your normal activities when your doctor says that it is safe. Do exercises as told by your doctor. General instructions Take over-the-counter and prescription medicines only as told by your doctor. Gently rub and stretch your  injured area as often as is comfortable. Wear elastic bandages only as told by your doctor. If one of your legs is shorter than the other, get fitted for a shoe insert or orthotic. Keep a healthy weight. Follow instructions from your doctor. Keep all follow-up visits. How is this prevented? Exercise regularly or as told by your doctor. Wear the right shoes for the sport you play and for daily activities. Warm up and stretch before being active. Cool down and stretch after being active. Take breaks often from repeated activity. Avoid activities that bother your hip or cause pain. Avoid sitting down for a long time. Where to find more information American Academy of Orthopa[employer]geons: orthoinfo.aaos.org Contact a doctor if: You have a fever. You have new symptoms. You have trouble walking or doing everyday activities. You have pain that gets worse or does not get better with medicine. The skin around your hip is red. You get a feeling of warmth in your  hip area. Get help right away if: You cannot move your hip. You have very bad pain. You cannot control the muscles in your feet. Summary Hip bursitis is swelling of one or more fluid-filled sacs (bursae) in your hip joint. Symptoms often come and go over time. This condition is often treated by resting and icing the hip. It also may help to keep the area raised and wrapped in an elastic bandage. Other treatments may be needed. This information is not intended to replace advice given to you by your health care provider. Make sure you discuss any questions you have with your health care provider. Document Revised: 06/11/2021 Document Reviewed: 06/11/2021 Elsevier Patient Education  2024 [employer] Inc.  Iliotibial Band Syndrome Rehab Ask your health care provider which exercises are safe for you. Do exercises exactly as told by your provider and adjust them as told. It's normal to feel mild stretching, pulling, tightness, or discomfort  as you do these exercises. Stop right away if you feel sudden pain or your pain gets a lot worse. Do not begin these exercises until told by your provider. Stretching and range-of-motion exercises These exercises warm up your muscles and joints. They also improve the movement and flexibility of your hip and pelvis. Quadriceps stretch, prone  Lie face down (prone) on a firm surface like a bed or padded floor. Bend your left / right knee. Reach back to hold your ankle or pant leg. If you can't reach your ankle or pant leg, use a belt looped around your foot and grab the belt instead. Gently pull your heel toward your butt. Your knee should not slide out to the side. You should feel a stretch in the front of your thigh and knee, also called the quadriceps. Hold this position for __________ seconds. Repeat __________ times. Complete this exercise __________ times a day. Iliotibial band stretch The iliotibial band is a strip of tissue that runs along the outside of your hip down to your knee. Lie on your side with your left / right leg on top. Bend both knees and grab your left / right ankle. Stretch out your bottom arm to help you balance. Slowly bring your top knee back so your thigh goes behind your back. Slowly lower your top leg toward the floor until you feel a gentle stretch on the outside of your left / right hip and thigh. If you don't feel a stretch and your knee won't go farther, place the heel of your other foot on top of your knee and pull your knee down toward the floor with your foot. Hold this position for __________ seconds. Repeat __________ times. Complete this exercise __________ times a day. Strengthening exercises These exercises build strength and endurance in your hip and pelvis. Endurance means your muscles can keep working even when they're tired. Straight leg raises, side-lying This exercise strengthens the muscles that rotate the leg at the hip and move it away from your  body. These muscles are called hip abductors. Lie on your side with your left / right leg on top. Lie so your head, shoulder, hip, and knee line up. You can bend your bottom knee to help you balance. Roll your hips slightly forward so they're stacked directly over each other. Your left / right knee should face forward. Tense the muscles in your outer thigh and hip. Lift your top leg 4-6 inches (10-15 cm) off the ground. Hold this position for __________ seconds. Slowly lower your leg back down to  the starting position. Let your muscles fully relax before doing this exercise again. Repeat __________ times. Complete this exercise __________ times a day. Leg raises, prone This exercise strengthens the muscles that move the hips backward. These muscles are called hip extensors. Lie face down (prone) on your bed or a firm surface. You can put a pillow under your hips for comfort and to support your lower back. Bend your left / right knee so your foot points straight up toward the ceiling. Keep the other leg straight and behind you. Squeeze your butt muscles. Lift your left / right thigh off the firm surface. Do not let your back arch. Tense your thigh muscle as hard as you can without having more knee pain. Hold this position for __________ seconds. Slowly lower your leg to the starting position. Allow your leg to relax all the way. Repeat __________ times. Complete this exercise __________ times a day. Hip hike  Stand sideways on a bottom step. Place your feet so that your left / right leg is on the step, and the other foot is hanging off the side. If you need support for balance, hold onto a railing or wall. Keep your knees straight and your abdomen square, meaning your hips are level. Then, lift your left / right hip up toward the ceiling. Slowly let your leg that's hanging off the step lower towards the floor. Your foot should get closer to the ground. Do not lean or bend your knees during this  movement. Repeat __________ times. Complete this exercise __________ times a day. This information is not intended to replace advice given to you by your health care provider. Make sure you discuss any questions you have with your health care provider. Document Revised: 08/29/2022 Document Reviewed: 08/29/2022 Elsevier Patient Education  2024 [employer].

## 2023-07-11 LAB — LIPID PANEL
Chol/HDL Ratio: 3.3 {ratio} (ref 0.0–4.4)
Cholesterol, Total: 147 mg/dL (ref 100–199)
HDL: 44 mg/dL (ref >39)
LDL Chol Calc (NIH): 87 mg/dL (ref 0–99)
Triglycerides: 81 mg/dL (ref 0–149)
VLDL Cholesterol Cal: 16 mg/dL (ref 5–40)

## 2023-07-11 LAB — COMPREHENSIVE METABOLIC PANEL
ALT: 13 [IU]/L (ref 0–32)
AST: 18 [IU]/L (ref 0–40)
Albumin: 3.8 g/dL (ref 3.8–4.9)
Alkaline Phosphatase: 73 [IU]/L (ref 44–121)
BUN/Creatinine Ratio: 23 (ref 12–28)
BUN: 22 mg/dL (ref 8–27)
Bilirubin Total: 0.5 mg/dL (ref 0.0–1.2)
CO2: 26 mmol/L (ref 20–29)
Calcium: 9.5 mg/dL (ref 8.7–10.3)
Chloride: 102 mmol/L (ref 96–106)
Creatinine, Ser: 0.97 mg/dL (ref 0.57–1.00)
Globulin, Total: 3.3 g/dL (ref 1.5–4.5)
Glucose: 99 mg/dL (ref 70–99)
Potassium: 3.6 mmol/L (ref 3.5–5.2)
Sodium: 141 mmol/L (ref 134–144)
Total Protein: 7.1 g/dL (ref 6.0–8.5)
eGFR: 67 mL/min/{1.73_m2} (ref >59)

## 2023-07-15 DIAGNOSIS — I2510 Atherosclerotic heart disease of native coronary artery without angina pectoris: Secondary | ICD-10-CM

## 2023-07-16 MED ORDER — ROSUVASTATIN CALCIUM 20 MG PO TABS: 20.0000 mg | ORAL_TABLET | Freq: Every day | ORAL | Status: DC

## 2023-07-16 NOTE — Telephone Encounter (Signed)
I spoke to the patient over the phone.  Shared decision is to hold off on Zetia 10 mg p.o. daily.  She is willing to increase the Crestor from 10 mg p.o. daily to 20 mg p.o. daily.  Please update the MAR to reflect the new dose of Crestor but do not send in a prescription for now(she has enough 10 mg tablets).  Please order fasting lipids and CMP in 6 weeks.  Given her history of TIA/lacunar infarct/CAC/aortic atherosclerosis would like to get her LDL at least close to 70 mg/dL and if possible 55 mg/dL.  Emil Weigold Wayne, DO, [facility]

## 2023-07-21 ENCOUNTER — Encounter: Payer: 59 | Admitting: Rheumatology

## 2023-07-23 ENCOUNTER — Ambulatory Visit: Payer: Medicaid Other | Admitting: Neurology

## 2023-07-23 ENCOUNTER — Encounter: Payer: Self-pay | Admitting: Neurology

## 2023-07-23 VITALS — BP 129/87 | HR 81 | Ht 65.0 in | Wt 207.0 lb

## 2023-07-23 DIAGNOSIS — Z8673 Personal history of transient ischemic attack (TIA), and cerebral infarction without residual deficits: Secondary | ICD-10-CM

## 2023-07-23 DIAGNOSIS — R6889 Other general symptoms and signs: Secondary | ICD-10-CM

## 2023-07-23 NOTE — Progress Notes (Signed)
[facility] [address] [place]. [place] [address] [phone]       OFFICE FOLLOW-UP VISIT NOTE  Evelyn Baker Date of Birth:  Sep 27, 1960 Medical Record Number:  [id number]   Referring MD:  Jarrett Soho, PA-c  Reason for Referral:  TIA  HPI: Initial visit 02/04/2023 Evelyn Baker is a 63 year old African-American lady seen today for initial office consultation visit for TIA.  History is obtained from the patient and review of referral notes and personally reviewed pertinent available imaging films in PACS.  She has past medical history of hypothyroidism, hypertension, gastroesophageal flux disease and lumbar disc disease.  Patient states that on 01/16/2023 she was at home doing work on her office computer when she felt trembling in her head that lasted several minutes.  She had trouble figuring out the words on the computer screen and and typing.  She stated that she was scribbling and not able to write clearly.  She appeared to be in a daze for couple of hours.  Her husband was sleeping finally woke up and talk to her and she was able to reply.  She denied significant headache, slurred speech, extremity weakness.  She however states she did not try to get up and walk during this episode.  Her husband who had had a stroke felt concerned and patient called primary care physician who ordered an MRI scan of the brain which was done on 01/22/2023 which shows right internal capsule lacunar infarct which is acute.  Patient had some lab work on 02/02/2023 which showed LDL-cholesterol to be 82 mg percent and hemoglobin A1c to be 6.6.  She has not had any neurovascular imaging done.  She denies any prior history of strokes TIA seizures significant head injury with loss of consciousness, migraine headaches or any other neurological problems.  She does not smoke and does not drink alcohol in significant amounts.  She does snore but has not been evaluated for sleep apnea.  She does have an  upcoming appointment to see cardiologist Dr. Rosemary Holms next week. Update 07/23/2023 : She returns for follow-up after last visit 5 months ago.  She states she is doing well.  She has had no recurrent TIA or stroke symptoms.  She gets occasionally off balance but has had no falls or injuries.  Has been reports for the last few months has been having some episodes where she is staring into space for a shot.  This.  Does not swelling of the lips or fingers or twitching is noted.  She can be quickly aroused.  She does look dazed and has a vacant stare in her eyes.  This has been occurring daily for the last 2 months.  She denies any seizures or loss of consciousness tongue bite or injury.  She remains on Plavix which she is tolerating well without bruising or bleeding.  She saw cardiologist Dr. Brynda Peon recently increase the dose of Crestor from 10 to 20 mg daily as follow-up lipid profile on 07/10/2023 showed LDL to be 87 mg percent.  She had CT angiogram of the brain and neck done on 02/13/2023 which showed no significant large vessel stenosis or occlusion.  Echocardiogram on 03/17/2023 showed EF to be 55 to 60%.  No cardiac source of embolism.  3-week heart monitor from 02/17/2023 to 03/09/2023 showed no evidence of atrial fibrillation.  Patient had a lot of lab work tested recently which were positive for vasculitic labs and she has been diagnosed with mixed connective tissue disorder  and is following up with rheumatologist Dr. Corliss Skains. ROS:   14 system review of systems is positive for head trembling sensation, difficulty reading, difficulty writing, altered mental status and all other systems negative  PMH:  Past Medical History:  Diagnosis Date   Asthma    Depression    Dyspnea    Hyperlipidemia    Hypertension    Hypothyroidism    Thyroid disease     Social History:  Social History   Socioeconomic History   Marital status: Married    Spouse name: Not on file   Number of children: 4   Years of  education: Not on file   Highest education level: Not on file  Occupational History   Not on file  Tobacco Use   Smoking status: Never    Passive exposure: Never   Smokeless tobacco: Never  Vaping Use   Vaping status: Never Used  Substance and Sexual Activity   Alcohol use: No   Drug use: No   Sexual activity: Yes    Birth control/protection: Surgical    Comment: partial hysterectomy  Other Topics Concern   Not on file  Social History Narrative   Not on file   Social Drivers of Health   Financial Resource Strain: Not on file  Food Insecurity: Not on file  Transportation Needs: Not on file  Physical Activity: Not on file  Stress: Not on file  Social Connections: Not on file  Intimate Partner Violence: Not on file    Medications:   Current Outpatient Medications on File Prior to Visit  Medication Sig Dispense Refill   albuterol (VENTOLIN HFA) 108 (90 Base) MCG/ACT inhaler Inhale 2 puffs into the lungs every 4 (four) hours as needed for wheezing or shortness of breath.     azathioprine (IMURAN) 100 MG tablet Take 1 tablet (100 mg total) by mouth daily as directed 90 tablet 0   chlorthalidone (HYGROTON) 25 MG tablet Take 1 tablet (25 mg total) by mouth in the morning. 90 tablet 3   Cholecalciferol (VITAMIN D-3 PO) Take by mouth.     clopidogrel (PLAVIX) 75 MG tablet Take 1 tablet (75 mg total) by mouth daily. 30 tablet 11   Cyanocobalamin (VITAMIN B-12 PO) Take by mouth.     EPINEPHrine 0.3 mg/0.3 mL IJ SOAJ injection Inject 0.3 mg into the muscle as needed for anaphylaxis.     levothyroxine (SYNTHROID) 125 MCG tablet Take 1 tablet (125 mcg total) by mouth every morning on an empty stomach 90 tablet 1   potassium chloride (MICRO-K) 10 MEQ CR capsule Take 1 capsule (10 mEq total) by mouth 2 (two) times daily. 180 capsule 3   rosuvastatin (CRESTOR) 20 MG tablet Take 1 tablet (20 mg total) by mouth daily.     valsartan (DIOVAN) 160 MG tablet Take 1 tablet (160 mg total) by mouth  daily. 90 tablet 0   No current facility-administered medications on file prior to visit.    Allergies:   Allergies  Allergen Reactions   Shellfish Allergy Anaphylaxis and Swelling   Avocado Swelling    Lips   Codeine Other (See Comments)    Constipation   Iodinated Contrast Media Other (See Comments)   Pineapple Swelling    lip   Penicillins Rash    Reaction: Childhood    Physical Exam General: well developed, well nourished, seated, in no evident distress Head: head normocephalic and atraumatic.   Neck: supple with no carotid or supraclavicular bruits Cardiovascular: regular rate and  rhythm, no murmurs Musculoskeletal: no deformity Skin:  no rash/petichiae Vascular:  Normal pulses all extremities  Neurologic Exam Mental Status: Awake and fully alert. Oriented to place and time. Recent and remote memory intact. Attention span, concentration and fund of knowledge appropriate. Mood and affect appropriate.  Cranial Nerves: Fundoscopic exam not done. Pupils equal, briskly reactive to light. Extraocular movements full without nystagmus. Visual fields full to confrontation. Hearing intact. Facial sensation intact. Face, tongue, palate moves normally and symmetrically.  Motor: Normal bulk and tone. Normal strength in all tested extremity muscles. Sensory.: intact to touch , pinprick , position and vibratory sensation.  Coordination: Rapid alternating movements normal in all extremities. Finger-to-nose and heel-to-shin performed accurately bilaterally. Gait and Station: Arises from chair without difficulty. Stance is normal. Gait demonstrates normal stride length and balance . Able to heel, toe and tandem walk with mild difficulty.  Reflexes: 1+ and symmetric. Toes downgoing.       ASSESSMENT: 63 year old African-American lady with transient episode of altered consciousness and difficulty reading and writing possibly of left hemispheric TIA and July 2024 with MRI done 6 days later  showing silent small right internal capsule acute lacunar infarct.  Vascular risk factors of diabetes, hypertension, hyperlipidemia obesity and at risk for sleep apnea.  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     PLAN:I had a long d/w patient and her husband about her recent TIA and lacunar stroke, recurrent episodes of brief altered awareness risk for recurrent stroke/TIAs, personally independently reviewed imaging studies and stroke evaluation results and answered questions..  I recommend she continue Plavix 75 mg daily   for secondary stroke prevention and maintain strict control of hypertension with blood pressure goal below 130/90, diabetes with hemoglobin A1c goal below 6.5% and lipids with LDL cholesterol goal below 70 mg/dL. I also advised the patient to eat a healthy diet with plenty of whole grains, cereals, fruits and vegetables, exercise regularly and maintain ideal body weight .check EEG for seizure activity..  Followup in the future with me in 6 months or call earlier if necessary.  Greater than 50% time doing this 35-minute visit spent on counseling and coordination of care about her TIA and silent lacunar stroke and discussion about evaluation and treatment and answering questions.   Delia Heady, MD Note: This document was prepared with digital dictation and possible smart phrase technology. Any transcriptional errors that result from this process are unintentional.

## 2023-07-23 NOTE — Patient Instructions (Signed)
I had a long d/w patient and her husband about her recent TIA and lacunar stroke, recurrent episodes of brief altered awareness risk for recurrent stroke/TIAs, personally independently reviewed imaging studies and stroke evaluation results and answered questions..  I recommend she continue Plavix 75 mg daily   for secondary stroke prevention and maintain strict control of hypertension with blood pressure goal below 130/90, diabetes with hemoglobin A1c goal below 6.5% and lipids with LDL cholesterol goal below 70 mg/dL. I also advised the patient to eat a healthy diet with plenty of whole grains, cereals, fruits and vegetables, exercise regularly and maintain ideal body weight .check EEG for seizure activity..  Followup in the future with me in 6 months or call earlier if necessary

## 2023-07-27 ENCOUNTER — Other Ambulatory Visit: Payer: Self-pay | Admitting: Cardiology

## 2023-07-27 ENCOUNTER — Other Ambulatory Visit (HOSPITAL_COMMUNITY): Payer: Self-pay

## 2023-07-28 ENCOUNTER — Other Ambulatory Visit (HOSPITAL_COMMUNITY): Payer: Self-pay

## 2023-07-28 ENCOUNTER — Encounter: Payer: Self-pay | Admitting: Cardiology

## 2023-07-28 ENCOUNTER — Other Ambulatory Visit: Payer: Self-pay | Admitting: Cardiology

## 2023-07-28 MED ORDER — ROSUVASTATIN CALCIUM 20 MG PO TABS
20.0000 mg | ORAL_TABLET | Freq: Every day | ORAL | 3 refills | Status: DC
  Filled 2023-07-28: qty 90, 90d supply, fill #0

## 2023-07-29 ENCOUNTER — Other Ambulatory Visit (HOSPITAL_COMMUNITY): Payer: Self-pay

## 2023-07-29 ENCOUNTER — Other Ambulatory Visit: Payer: Self-pay

## 2023-07-29 MED ORDER — ROSUVASTATIN CALCIUM 20 MG PO TABS
20.0000 mg | ORAL_TABLET | Freq: Every day | ORAL | 3 refills | Status: DC
  Filled 2023-07-29 – 2023-10-22 (×2): qty 90, 90d supply, fill #0
  Filled 2024-01-18: qty 90, 90d supply, fill #1

## 2023-07-29 NOTE — Telephone Encounter (Signed)
Left message for patient to call back

## 2023-07-29 NOTE — Telephone Encounter (Signed)
Pt would like a call back concerning her medication potassium. Pt wanted to know if Dr. Odis Hollingshead still wants her to take 2 tablet BID? Please address

## 2023-08-03 ENCOUNTER — Other Ambulatory Visit (HOSPITAL_COMMUNITY): Payer: Self-pay

## 2023-08-05 ENCOUNTER — Other Ambulatory Visit (HOSPITAL_COMMUNITY): Payer: Self-pay

## 2023-08-06 ENCOUNTER — Ambulatory Visit: Payer: 59 | Admitting: Neurology

## 2023-08-11 ENCOUNTER — Other Ambulatory Visit (HOSPITAL_COMMUNITY): Payer: Self-pay

## 2023-08-11 ENCOUNTER — Other Ambulatory Visit: Payer: Self-pay

## 2023-08-11 ENCOUNTER — Telehealth: Payer: Self-pay | Admitting: Internal Medicine

## 2023-08-11 ENCOUNTER — Other Ambulatory Visit: Payer: Self-pay | Admitting: Cardiology

## 2023-08-11 DIAGNOSIS — I10 Essential (primary) hypertension: Secondary | ICD-10-CM

## 2023-08-11 DIAGNOSIS — E876 Hypokalemia: Secondary | ICD-10-CM

## 2023-08-11 MED ORDER — VALSARTAN 160 MG PO TABS
160.0000 mg | ORAL_TABLET | Freq: Every day | ORAL | 2 refills | Status: DC
  Filled 2023-08-11: qty 90, 90d supply, fill #0

## 2023-08-11 MED ORDER — POTASSIUM CHLORIDE ER 10 MEQ PO CPCR
20.0000 meq | ORAL_CAPSULE | Freq: Two times a day (BID) | ORAL | 3 refills | Status: DC
  Filled 2023-08-11: qty 120, 30d supply, fill #0
  Filled 2023-08-11: qty 180, 45d supply, fill #0
  Filled 2023-08-17: qty 360, 90d supply, fill #0
  Filled 2023-08-28: qty 180, 45d supply, fill #0
  Filled 2023-10-08: qty 180, 45d supply, fill #1

## 2023-08-11 NOTE — Telephone Encounter (Signed)
Pt is scheduled for rov with MR tomorrow 08/12/23 She is not due for rov until July 2025 and needs PFT's first  I called her to see if she feels like she needs to be seen tomorrow, or was the appt too soon in error  Had to [facility]

## 2023-08-11 NOTE — Telephone Encounter (Signed)
Patient would like to come in for appointment on 08/12/2023.

## 2023-08-12 ENCOUNTER — Encounter: Payer: Self-pay | Admitting: Internal Medicine

## 2023-08-12 ENCOUNTER — Other Ambulatory Visit (HOSPITAL_COMMUNITY): Payer: Self-pay

## 2023-08-12 ENCOUNTER — Ambulatory Visit: Payer: Medicaid Other | Admitting: Internal Medicine

## 2023-08-12 VITALS — BP 122/70 | HR 84 | Ht 65.0 in | Wt 205.6 lb

## 2023-08-12 DIAGNOSIS — R0609 Other forms of dyspnea: Secondary | ICD-10-CM | POA: Diagnosis not present

## 2023-08-12 DIAGNOSIS — Z8709 Personal history of other diseases of the respiratory system: Secondary | ICD-10-CM | POA: Diagnosis not present

## 2023-08-12 DIAGNOSIS — R9389 Abnormal findings on diagnostic imaging of other specified body structures: Secondary | ICD-10-CM | POA: Diagnosis not present

## 2023-08-12 DIAGNOSIS — M3500 Sicca syndrome, unspecified: Secondary | ICD-10-CM

## 2023-08-12 MED ORDER — BUDESONIDE-FORMOTEROL FUMARATE 80-4.5 MCG/ACT IN AERO
2.0000 | INHALATION_SPRAY | Freq: Two times a day (BID) | RESPIRATORY_TRACT | 11 refills | Status: AC
Start: 2023-08-12 — End: ?
  Filled 2023-08-12: qty 10.2, 30d supply, fill #0
  Filled 2023-10-08: qty 10.2, 30d supply, fill #1
  Filled 2023-11-17: qty 10.2, 30d supply, fill #2
  Filled 2023-12-21: qty 10.2, 30d supply, fill #3
  Filled 2024-01-18: qty 10.2, 30d supply, fill #4
  Filled 2024-04-26: qty 10.2, 30d supply, fill #5

## 2023-08-12 NOTE — Patient Instructions (Addendum)
ICD-10-CM   1. DOE (dyspnea on exertion)  R06.09     2. History of asthma  Z87.09     3. Sjogren's syndrome, with unspecified organ involvement (HCC)  M35.00     4. Abnormal CT of the chest  R93.89       Broad possibilities for shortness of breath with exertion relieved by rest.  This could be asthma but with her chest tightness or wheezing or cough it is unlikely.  It could be awaited could be stiff heart muscle agree physical deconditioning.  It could be return of pulmonary inflammation in the lungs.  But I understand that you feel that this might be a asthma and he wants Symbicort refilled.   PLAN -Start Symbicort 80/4.52 puffs 2 times daily -Use albuterol as needed -Check blood work for CBC, BNP and D-dimer 08/12/2023   -If abnormal we will let you know -Do pulmonary function test in June/July 2025 - Do high-resolution CT scan of the chest June/July 2025  Coronary Artery Calcification on CT scan Small amount of pericardial effusion  -According to Dr Odis Hollingshead   OBesity  - talk to PCP Jarrett Soho, PA-C abou weight loss drugs  Followup - 15 min visit in  June/July 2025 afte tests

## 2023-08-12 NOTE — Progress Notes (Signed)
OV 09/25/2022  Subjective:  Patient ID: Evelyn Baker, female , DOB: Jul 25, 1960 , age 63 y.o. , MRN: [id number] , ADDRESS: [address] [place] [address] PCP Jarrett Soho, PA-C Patient Care Team: Jarrett Soho, PA-C as PCP - General (Family Medicine)  This Provider for this visit: Treatment Team:  Attending Provider: Kalman Shan, MD    09/25/2022 -   Chief Complaint  Patient presents with   Consult    Lung nodule. Possible ILD     HPI Evelyn Baker 63 y.o. -63 year old female accompanied by her husband.  She is originally from [place].  Moved to [place] area in 1993.  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  She tells me that she was doing well till around Thanksgiving 2023.  She then started developing problems in the left shin including warmth and discoloration.  She was subsequently diagnosed to have rheumatoid arthritis as the main etiology for the problem.  She has been on Imuran and prednisone by Dr. Deanne Coffer.  She says she is having a lot of flareups but she denies any chronic shortness of breath or cough or wheezing.  She also recollects that end of January 2024 she had influenza that was confirmed.  She had a lot of cough and this ultimately resolved.  Husband also was sick at this time.  Because of her rheumatoid arthritis issues she ended up having a CT scan of the chest abdomen pelvis 08/07/2022.  I personally visualized this and I agree with the radiologist that shows some groundglass opacities particularly in the right lower lobe and left lower lobe.  There is also some in the right upper lobe anterior segment.  This is a CT chest with contrast.  Given the concern of these infiltrates but also because of rheumatoid arthritis she had a follow-up scan 3 weeks later on 09/10/2022.  This was a high-resolution CT chest without contrast that I personally visualized.  A lot of these groundglass opacities have resolved/improved.  In  the right upper lobe anterior segment that is now like a healing scar.  Radiologist also feels as widespread bronchiectasis although I am not personally able to appreciate that.  She also has diffuse lymphadenopathy.  She says she had a biopsy for this and the biopsies are benign.  The biopsy report are not available to me.  Currently she not having any shortness of breath cough or wheezing and she is asymptomatic and feeling well.  No B symptoms.  She is worried about ILD.    CT Chest data - HRCT 09/10/22  DDENDUM: As mentioned in the body of the report, there are multiple bilateral axillary and subpectoral lymph nodes (right-greater-than-left). This is nonspecific, but correlation with mammography and physical examination is recommended, as the possibility of malignancy such as breast cancer is not excluded.   These results will be called to the ordering clinician or representative by the Radiologist Assistant, and communication documented in the PACS or [employer].     Electronically Signed   By: Trudie Reed M.D.   On: 09/15/2022 05:47    Addended by Florencia Reasons, MD on 09/15/2022  5:49 AM    Study Result  Narrative & Impression  CLINICAL DATA:  63 year old female with history of asthma, shortness of breath and elevated sedimentation rate. Evaluate for interstitial lung disease.   EXAM: CT CHEST WITHOUT CONTRAST   TECHNIQUE: Multidetector CT imaging of the chest was performed following the standard protocol  without intravenous contrast. High resolution imaging of the lungs, as well as inspiratory and expiratory imaging, was performed.   RADIATION DOSE REDUCTION: This exam was performed according to the departmental dose-optimization program which includes automated exposure control, adjustment of the mA and/or kV according to patient size and/or use of iterative reconstruction technique.   COMPARISON:  CT of the chest, abdomen and pelvis 08/07/2022.    FINDINGS: Cardiovascular: Heart size is normal. Trace amount of pericardial fluid and/or thickening, unlikely to be of any hemodynamic significance at this time. No pericardial calcification. There is aortic atherosclerosis, as well as atherosclerosis of the great vessels of the mediastinum and the coronary arteries, including calcified atherosclerotic plaque in the left anterior descending and right coronary arteries.   Mediastinum/Nodes: No pathologically enlarged mediastinal or hilar lymph nodes. Please note that accurate exclusion of hilar adenopathy is limited on noncontrast CT scans. Esophagus is unremarkable in appearance. Numerous prominent axillary and subpectoral lymph nodes bilaterally (right-greater-than-left), largest is on the right side (axial image 24 of series 2) measuring 1.5 cm in short axis, increased compared to the prior study.   Lungs/Pleura: Widespread areas of cylindrical bronchiectasis with thickening of the peribronchovascular interstitium noted in the lungs bilaterally, most severe in the lung bases where there is some regional architectural distortion and some patchy ground-glass attenuation and volume loss. No other generalized regions of ground-glass attenuation, septal thickening, subpleural reticulation or honeycombing are noted. Inspiratory and expiratory imaging demonstrates some mild air trapping indicative of small airways disease. No acute consolidative airspace disease. No pleural effusions. No definite suspicious appearing pulmonary nodules or masses are noted.   Upper Abdomen: Unremarkable.   Musculoskeletal: There are no aggressive appearing lytic or blastic lesions noted in the visualized portions of the skeleton.   IMPRESSION: 1. 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 Further clinical evaluation is recommended. Strictly  speaking, interstitial lung disease is not entirely excluded, but not favored on the basis of today's examination. Repeat high-resolution chest CT should be considered in 12 months to assess for temporal changes in the appearance of the lung parenchyma. 2. Aortic atherosclerosis, in addition to two-vessel coronary artery disease. Please note that although the presence of coronary artery calcium documents the presence of coronary artery disease, the severity of this disease and any potential stenosis cannot be assessed on this non-gated CT examination. Assessment for potential risk factor modification, dietary therapy or pharmacologic therapy may be warranted, if clinically indicated. 3. Trace volume of pericardial fluid and/or thickening, unlikely of hemodynamic significance at this time.   Aortic Atherosclerosis (ICD10-I70.0).   Electronically Signed: By: Trudie Reed M.D. On: 09/14/2022 12:02       OV 01/13/2023  Subjective:  Patient ID: Evelyn Baker, female , DOB: 1960-08-26 , age 76 y.o. , MRN: [id number] , ADDRESS: [address] [place] [address] PCP Jarrett Soho, PA-C Patient Care Team: Jarrett Soho, PA-C as PCP - General (Family Medicine)  This Provider for this visit: Treatment Team:  Attending Provider: Kalman Shan, MD    01/13/2023 -   Chief Complaint  Patient presents with   Follow-up    F/u ct      HPI Evelyn Baker 63 y.o. -resents with her husband to discuss test results.  She continues to be asymptomatic.  She maintains herself on Symbicort.  She has not had any rheumatoid arthritis flareu Interim Health status: No new complaints No new  medical problems. No new surgeries. No ER visits. No Urgent care visits. No changes to medications.  She had a CT scan of the chest and the right middle lobe bronchiectasis related infiltrates are significantly improved.  The groundglass opacities on the bottom of the lungs is nearly  resolved.  This is my personal independent interpretation.  There are no new issues.  She continues on Imuran.  She is concerned about her coronary artery calcification and small amount of pericardial fluid seen on both scans.  She does not have any chest pain.  I have referred her to Dr. Rosemary Holms.  She does not want to be seen at [facility] because of the delays.     CT Chest data from date:12/26/2022  - personally visualized and independently interpreted : Yes - my findings are: Read above  arrative & Impression  CLINICAL DATA:  Possible interstitial lung disease.   EXAM: CT CHEST WITHOUT CONTRAST   TECHNIQUE: Multidetector CT imaging of the chest was performed following the standard protocol without intravenous contrast. High resolution imaging of the lungs, as well as inspiratory and expiratory imaging, was performed.   RADIATION DOSE REDUCTION: This exam was performed according to the departmental dose-optimization program which includes automated exposure control, adjustment of the mA and/or kV according to patient size and/or use of iterative reconstruction technique.   COMPARISON:  09/10/2022, 08/07/2022.   FINDINGS: Cardiovascular: Heart is enlarged. Small amount of pericardial fluid may be physiologic.   Mediastinum/Nodes: 2.3 cm low-attenuation left thyroid nodule. No pathologically enlarged mediastinal lymph nodes. Hilar regions are difficult to evaluate without IV contrast. Small to borderline enlarged axillary lymph nodes measure up to 1.4 cm on the right, as on 09/10/2022. Esophagus is grossly unremarkable.   Lungs/Pleura: Mild basilar cylindrical bronchiectasis, peribronchovascular nodularity and ground-glass. Negative for subpleural reticulation, traction bronchiectasis/bronchiolectasis, architectural distortion or honeycombing, including on prone imaging. Lungs are otherwise clear. No pleural fluid. Airway is unremarkable. No air trapping.   Upper Abdomen:  Visualized portions of the liver, gallbladder, adrenal glands, kidneys, spleen, pancreas, stomach and bowel are grossly unremarkable. No upper abdominal adenopathy.   Musculoskeletal: Degenerative changes in the spine. No worrisome lytic or sclerotic lesions.   IMPRESSION: 1. No evidence of interstitial lung disease. 2. Bibasilar postinfectious scarring. 3. Small to borderline enlarged axillary lymph nodes, as before. Difficult to exclude a lymphoproliferative disorder. 4. 2.3 cm low-attenuation left thyroid nodule. Patient underwent thyroid ultrasound 04/25/2022. No follow-up recommended unless clinically warranted. (Ref: J Am Coll Radiol. 2015 Feb;12(2): 143-50).     Electronically Signed   By: Leanna Battles M.D.   On: 12/30/2022 11:49    OV 08/12/2023  Subjective:  Patient ID: Evelyn Baker, female , DOB: 06-Dec-1960 , age 62 y.o. , MRN: [id number] , ADDRESS: [address] [place] [address] PCP Jarrett Soho, PA-C Patient Care Team: Jarrett Soho, PA-C as PCP - General (Family Medicine) Tessa Lerner, DO as PCP - Cardiology (Cardiology)  This Provider for this visit: Treatment Team:  Attending Provider: Kalman Shan, MD    08/12/2023 -   Chief Complaint  Patient presents with   Follow-up    Increased SOB since Jan 2025. She gets winded walking upstairs. She is asking for new rx for symbicort.      HPI Evelyn Baker 63 y.o. -returns for follow-up.  Presents with her husband.  Husband's not a historian today.  She tells me since January 2025 she is having insidious onset of shortness of breath with exertion relieved by  rest.  This is when she walks around or goes up stairs.  It is relieved by rest.  Any respiratory infection symptoms make it worse but there is no wheezing no chest pain no chest tightness no paroxysmal nocturnal dyspnea no orthopnea no edema no cough.  She feels it is her asthma and she wants her Symbicort refilled.  However in  the past have not addressed asthma with her.  In the fall 2024 she did have echocardiogram and there is no pericardial effusion but she did have grade 1 diastolic dysfunction.  Follow-up 2024 hemoglobin is normal.  100% REst with HR 79 -> 95% after 200 feet. No desats   PFT      No data to display             LAB RESULTS last 96 hours No results found.       has a past medical history of Asthma, Depression, Dyspnea, Hyperlipidemia, Hypertension, Hypothyroidism, and Thyroid disease.   reports that she has never smoked. She has never been exposed to tobacco smoke. She has never used smokeless tobacco.  Past Surgical History:  Procedure Laterality Date   FOOT SURGERY Left    STRABISMUS SURGERY     age 27   TOTAL ABDOMINAL HYSTERECTOMY     VEIN LIGATION AND STRIPPING Left 01/22/2021   Procedure: LEFT GREATER SAPHENOUS VEIN  LIGATION AND STRIPPING;  Surgeon: Sherren Kerns, MD;  Location: [facility] OR;  Service: Vascular;  Laterality: Left;    Allergies  Allergen Reactions   Shellfish Allergy Anaphylaxis and Swelling   Avocado Swelling    Lips   Codeine Other (See Comments)    Constipation   Iodinated Contrast Media Other (See Comments)   Pineapple Swelling    lip   Penicillins Rash    Reaction: Childhood    Immunization History  Administered Date(s) Administered   Influenza-Unspecified 03/30/2014, 07/13/2019, 03/01/2023    Family History  Problem Relation Age of Onset   Heart failure Mother    Kidney failure Mother    Stroke Sister    Aneurysm Sister    Heart Problems Brother    Hypertension Son    Hypertension Daughter      Current Outpatient Medications:    albuterol (VENTOLIN HFA) 108 (90 Base) MCG/ACT inhaler, Inhale 2 puffs into the lungs every 4 (four) hours as needed for wheezing or shortness of breath., Disp: , Rfl:    azathioprine (IMURAN) 100 MG tablet, Take 1 tablet (100 mg total) by mouth daily as directed, Disp: 90 tablet, Rfl: 0    budesonide-formoterol (SYMBICORT) 80-4.5 MCG/ACT inhaler, Inhale 2 puffs into the lungs in the morning and at bedtime., Disp: 1 each, Rfl: 11   chlorthalidone (HYGROTON) 25 MG tablet, Take 1 tablet (25 mg total) by mouth in the morning., Disp: 90 tablet, Rfl: 3   Cholecalciferol (VITAMIN D-3 PO), Take by mouth., Disp: , Rfl:    clopidogrel (PLAVIX) 75 MG tablet, Take 1 tablet (75 mg total) by mouth daily., Disp: 30 tablet, Rfl: 11   Cyanocobalamin (VITAMIN B-12 PO), Take by mouth., Disp: , Rfl:    EPINEPHrine 0.3 mg/0.3 mL IJ SOAJ injection, Inject 0.3 mg into the muscle as needed for anaphylaxis., Disp: , Rfl:    levothyroxine (SYNTHROID) 125 MCG tablet, Take 1 tablet (125 mcg total) by mouth every morning on an empty stomach, Disp: 90 tablet, Rfl: 1   potassium chloride (MICRO-K) 10 MEQ CR capsule, Take 2 capsules (20 mEq total) by mouth  2 (two) times daily., Disp: 180 capsule, Rfl: 3   rosuvastatin (CRESTOR) 20 MG tablet, Take 1 tablet (20 mg total) by mouth daily., Disp: 90 tablet, Rfl: 3   valsartan (DIOVAN) 160 MG tablet, Take 1 tablet (160 mg total) by mouth daily., Disp: 90 tablet, Rfl: 2      Objective:   Vitals:   08/12/23 1428  BP: 122/70  Pulse: 84  SpO2: 99%  Weight: 205 lb 9.6 oz (93.3 kg)  Height: 5\' 5"  (1.651 m)    Estimated body mass index is 34.21 kg/m as calculated from the following:   Height as of this encounter: 5\' 5"  (1.651 m).   Weight as of this encounter: 205 lb 9.6 oz (93.3 kg).  @WEIGHTCHANGE @  [employer]   08/12/23 1428  Weight: 205 lb 9.6 oz (93.3 kg)     Physical Exam   General: No distress. Looks well O2 at rest: no Cane present: no Sitting in wheel chair: no Frail: no Obese: no Neuro: Alert and Oriented x 3. GCS 15. Speech normal Psych: Pleasant Resp:  Barrel Chest - no.  Wheeze - no, Crackles - no, No overt respiratory distress CVS: Normal heart sounds. Murmurs - no Ext: Stigmata of Connective Tissue Disease - no HEENT: Normal  upper airway. PEERL +. No post nasal drip        Assessment:       ICD-10-CM   1. DOE (dyspnea on exertion)  R06.09 CT Chest High Resolution    Pulmonary function test    D-Dimer, Quantitative    B Nat Peptide    Basic Metabolic Panel (BMET)    CBC w/Diff    IgE    2. History of asthma  Z87.09 CT Chest High Resolution    Pulmonary function test    D-Dimer, Quantitative    B Nat Peptide    Basic Metabolic Panel (BMET)    CBC w/Diff    IgE    3. Sjogren's syndrome, with unspecified organ involvement (HCC)  M35.00 CT Chest High Resolution    Pulmonary function test    D-Dimer, Quantitative    B Nat Peptide    Basic Metabolic Panel (BMET)    CBC w/Diff    IgE    4. Abnormal CT of the chest  R93.89 CT Chest High Resolution    Pulmonary function test    D-Dimer, Quantitative    B Nat Peptide    Basic Metabolic Panel (BMET)    CBC w/Diff    IgE         Plan:     Patient Instructions     ICD-10-CM   1. DOE (dyspnea on exertion)  R06.09     2. History of asthma  Z87.09     3. Sjogren's syndrome, with unspecified organ involvement (HCC)  M35.00     4. Abnormal CT of the chest  R93.89       Broad possibilities for shortness of breath with exertion relieved by rest.  This could be asthma but with her chest tightness or wheezing or cough it is unlikely.  It could be awaited could be stiff heart muscle agree physical deconditioning.  It could be return of pulmonary inflammation in the lungs.  But I understand that you feel that this might be a asthma and he wants Symbicort refilled.   PLAN -Start Symbicort 80/4.52 puffs 2 times daily -Use albuterol as needed -Check blood work for CBC, BNP and D-dimer 08/12/2023   -If abnormal  we will let you know -Do pulmonary function test in June/July 2025 - Do high-resolution CT scan of the chest June/July 2025  Coronary Artery Calcification on CT scan Small amount of pericardial effusion  -According to Dr  Odis Hollingshead   OBesity  - talk to PCP Jarrett Soho, PA-C abou weight loss drugs  Followup - 15 min visit in  June/July 2025 afte tests   FOLLOWUP No follow-ups on file.    SIGNATURE    Dr. Kalman Shan, M.D., F.C.C.P,  Pulmonary and Critical Care Medicine Staff Physician, [facility] Director - Interstitial Lung Disease  Program  Pulmonary Fibrosis [facility] at [facility] [place], [place], [address]  Pager: [phone], If no answer or between  15:00h - 7:00h: call [phone] Telephone: [phone]  2:59 PM 08/12/2023

## 2023-08-13 ENCOUNTER — Ambulatory Visit: Payer: Medicaid Other | Admitting: Neurology

## 2023-08-13 ENCOUNTER — Encounter (HOSPITAL_COMMUNITY): Payer: Self-pay

## 2023-08-13 ENCOUNTER — Other Ambulatory Visit (HOSPITAL_COMMUNITY): Payer: Self-pay

## 2023-08-13 DIAGNOSIS — R6889 Other general symptoms and signs: Secondary | ICD-10-CM

## 2023-08-13 DIAGNOSIS — R4182 Altered mental status, unspecified: Secondary | ICD-10-CM

## 2023-08-17 ENCOUNTER — Other Ambulatory Visit (HOSPITAL_COMMUNITY): Payer: Self-pay

## 2023-08-19 ENCOUNTER — Encounter: Payer: Self-pay | Admitting: *Deleted

## 2023-08-19 NOTE — Progress Notes (Signed)
Kindly inform the patient that EEG of brainwave study was normal.

## 2023-08-20 ENCOUNTER — Encounter: Payer: Self-pay | Admitting: Internal Medicine

## 2023-08-20 DIAGNOSIS — R9389 Abnormal findings on diagnostic imaging of other specified body structures: Secondary | ICD-10-CM

## 2023-08-20 DIAGNOSIS — M3500 Sicca syndrome, unspecified: Secondary | ICD-10-CM

## 2023-08-20 DIAGNOSIS — R0609 Other forms of dyspnea: Secondary | ICD-10-CM

## 2023-08-20 DIAGNOSIS — Z8709 Personal history of other diseases of the respiratory system: Secondary | ICD-10-CM

## 2023-08-25 NOTE — Addendum Note (Signed)
 Addended by: Lajoyce Lauber A on: 08/25/2023 02:30 PM   Modules accepted: Orders

## 2023-08-28 ENCOUNTER — Other Ambulatory Visit (HOSPITAL_COMMUNITY): Payer: Self-pay

## 2023-08-29 LAB — BASIC METABOLIC PANEL
BUN/Creatinine Ratio: 16 (ref 12–28)
BUN: 15 mg/dL (ref 8–27)
CO2: 25 mmol/L (ref 20–29)
Calcium: 9.8 mg/dL (ref 8.7–10.3)
Chloride: 101 mmol/L (ref 96–106)
Creatinine, Ser: 0.94 mg/dL (ref 0.57–1.00)
Glucose: 89 mg/dL (ref 70–99)
Potassium: 3.8 mmol/L (ref 3.5–5.2)
Sodium: 139 mmol/L (ref 134–144)
eGFR: 69 mL/min/{1.73_m2} (ref >59)

## 2023-08-29 LAB — CBC WITH DIFFERENTIAL/PLATELET
Basophils Absolute: 0 10*3/uL (ref 0.0–0.2)
Basos: 0 %
EOS (ABSOLUTE): 0.2 10*3/uL (ref 0.0–0.4)
Eos: 5 %
Hematocrit: 35.9 % (ref 34.0–46.6)
Hemoglobin: 12.1 g/dL (ref 11.1–15.9)
Immature Grans (Abs): 0 10*3/uL (ref 0.0–0.1)
Immature Granulocytes: 0 %
Lymphocytes Absolute: 0.7 10*3/uL (ref 0.7–3.1)
Lymphs: 16 %
MCH: 28.1 pg (ref 26.6–33.0)
MCHC: 33.7 g/dL (ref 31.5–35.7)
MCV: 83 fL (ref 79–97)
Monocytes Absolute: 0.5 10*3/uL (ref 0.1–0.9)
Monocytes: 11 %
Neutrophils Absolute: 3 10*3/uL (ref 1.4–7.0)
Neutrophils: 68 %
Platelets: 255 10*3/uL (ref 150–450)
RBC: 4.31 x10E6/uL (ref 3.77–5.28)
RDW: 15.7 % — ABNORMAL HIGH (ref 11.7–15.4)
WBC: 4.5 10*3/uL (ref 3.4–10.8)

## 2023-08-29 LAB — IGE: IgE (Immunoglobulin E), Serum: 211 [IU]/mL (ref 6–495)

## 2023-08-29 LAB — D-DIMER, QUANTITATIVE: D-DIMER: 0.37 mg{FEU}/L (ref 0.00–0.49)

## 2023-08-31 ENCOUNTER — Ambulatory Visit: Payer: Medicaid Other | Admitting: Internal Medicine

## 2023-09-21 ENCOUNTER — Other Ambulatory Visit: Payer: Self-pay | Admitting: Rheumatology

## 2023-09-21 ENCOUNTER — Other Ambulatory Visit: Payer: Self-pay

## 2023-09-21 ENCOUNTER — Other Ambulatory Visit (HOSPITAL_COMMUNITY): Payer: Self-pay

## 2023-09-21 MED ORDER — AZATHIOPRINE 100 MG PO TABS
100.0000 mg | ORAL_TABLET | Freq: Every day | ORAL | 0 refills | Status: DC
  Filled 2023-09-21: qty 90, 90d supply, fill #0

## 2023-09-21 NOTE — Telephone Encounter (Signed)
 Last Fill: 05/27/2023  Labs: 08/25/2023 RDW 15.7  Next Visit: 10/09/2023  Last Visit: 07/10/2023  DX: Mixed connective tissue disease   Current Dose per office note 07/10/2023: Imuran 100 mg p.o. daily   Okay to refill Imuran?

## 2023-09-22 ENCOUNTER — Other Ambulatory Visit (HOSPITAL_COMMUNITY): Payer: Self-pay

## 2023-09-22 MED ORDER — LEVOTHYROXINE SODIUM 125 MCG PO TABS
125.0000 ug | ORAL_TABLET | Freq: Every morning | ORAL | 1 refills | Status: DC
  Filled 2023-09-22: qty 90, 90d supply, fill #0

## 2023-09-25 NOTE — Progress Notes (Signed)
 Office Visit Note  Patient: Evelyn Baker             Date of Birth: 1960-08-10           MRN: [id number]             PCP: Jarrett Soho, PA-C Referring: Jarrett Soho, PA-C Visit Date: 10/09/2023 Occupation: @GUAROCC @  Subjective:  Pain in left hip  History of Present Illness: Evelyn Baker is a 63 y.o. female with mixed connective tissue disease and osteoarthritis.  She returns today after her last visit in January 2025.  She was accompanied by her husband.  She states she did not have much relief from left trochanteric bursa injection.  She has been going [place].  She has difficulty sitting due to pain in her left buttock which radiates into the back of her thigh.  She also complains of discomfort in her left lower extremity.  She states she has a lot of fluid retention.  She continues to have fatigue, dry mouth and dry eyes.  She continues to have some shortness of breath.  She has not noticed any joint swelling.  She was evaluated by Dr. Colletta Maryland and was given Symbicort inhaler which has been helpful.  CT scan of the chest is pending.    Activities of Daily Living:  Patient reports morning stiffness for several hours.   Patient Reports nocturnal pain.  Difficulty dressing/grooming: Reports Difficulty climbing stairs: Reports Difficulty getting out of chair: Reports Difficulty using hands for taps, buttons, cutlery, and/or writing: Denies  Review of Systems  Constitutional:  Positive for fatigue.  HENT:  Positive for mouth dryness. Negative for mouth sores.   Eyes:  Positive for dryness.  Respiratory:  Positive for shortness of breath.   Cardiovascular:  Positive for swelling in legs/feet. Negative for chest pain and palpitations.  Gastrointestinal:  Negative for blood in stool, constipation and diarrhea.  Endocrine: Negative for increased urination.  Genitourinary:  Negative for involuntary urination.  Musculoskeletal:  Positive for joint pain, gait  problem, joint pain, myalgias, muscle weakness, morning stiffness and myalgias. Negative for joint swelling and muscle tenderness.  Skin:  Negative for color change, rash, hair loss and sensitivity to sunlight.  Allergic/Immunologic: Positive for susceptible to infections.  Neurological:  Negative for dizziness and headaches.  Hematological:  Negative for swollen glands.  Psychiatric/Behavioral:  Positive for depressed mood and sleep disturbance. The patient is not nervous/anxious.     PMFS History:  Patient Active Problem List   Diagnosis Date Noted   Mixed connective tissue disease (HCC) 10/09/2023   Primary osteoarthritis of both feet 10/09/2023   Dyspareunia in female 05/05/2022   Abnormal urine 05/05/2022   Chronic constipation 05/05/2022   Fibrocystic disease of breast 05/05/2022   Obesity 05/05/2022   Reactive depression (situational) 05/05/2022   Chronic venous insufficiency 02/13/2020   Leg edema, left 02/13/2020    Past Medical History:  Diagnosis Date   Asthma    Depression    Dyspnea    Hyperlipidemia    Hypertension    Hypothyroidism    Thyroid disease     Family History  Problem Relation Age of Onset   Heart failure Mother    Kidney failure Mother    Stroke Sister    Aneurysm Sister    Heart Problems Brother    Hypertension Son    Hypertension Daughter    Past Surgical History:  Procedure Laterality Date   FOOT SURGERY Left  STRABISMUS SURGERY     age 46   TOTAL ABDOMINAL HYSTERECTOMY     VEIN LIGATION AND STRIPPING Left 01/22/2021   Procedure: LEFT GREATER SAPHENOUS VEIN  LIGATION AND STRIPPING;  Surgeon: Sherren Kerns, MD;  [facility];  Service: Vascular;  Laterality: Left;   Social History   Social History Narrative   Not on file   Immunization History  Administered Date(s) Administered   Influenza-Unspecified 03/30/2014, 07/13/2019, 03/01/2023     Objective: Vital Signs: BP 135/84 (BP Location: Left Arm, Patient Position:  Sitting, Cuff Size: Normal)   Pulse 76   Resp 15   Ht 5\' 5"  (1.651 m)   Wt 203 lb (92.1 kg)   BMI 33.78 kg/m    Physical Exam Vitals and nursing note reviewed.  Constitutional:      Appearance: She is well-developed.  HENT:     Head: Normocephalic and atraumatic.  Eyes:     Conjunctiva/sclera: Conjunctivae normal.  Cardiovascular:     Rate and Rhythm: Normal rate and regular rhythm.     Heart sounds: Normal heart sounds.  Pulmonary:     Effort: Pulmonary effort is normal.     Breath sounds: Normal breath sounds.  Abdominal:     General: Bowel sounds are normal.     Palpations: Abdomen is soft.  Musculoskeletal:     Cervical back: Normal range of motion.     Right lower leg: Edema present.     Left lower leg: Edema present.  Lymphadenopathy:     Cervical: No cervical adenopathy.  Skin:    General: Skin is warm and dry.     Capillary Refill: Capillary refill takes less than 2 seconds.  Neurological:     Mental Status: She is alert and oriented to person, place, and time.  Psychiatric:        Behavior: Behavior normal.      Musculoskeletal Exam: Cervical, thoracic spine within good range of motion.  She had difficulty with range of motion of her lumbar spine.  She had no point tenderness over the lumbar spine.  Shoulders, elbows, wrist, MCPs and PIPs were in good range of motion with no synovitis.  Hip joints and knee joints in good range of motion without any warmth swelling or effusion.  She had tenderness over left ischial bursa.  She also had bilateral lower extremity edema.  There was no tenderness over ankles or MTPs.  CDAI Exam: CDAI Score: -- Patient Global: --; Provider Global: -- Swollen: --; Tender: -- Joint Exam 10/09/2023   No joint exam has been documented for this visit   There is currently no information documented on the homunculus. Go to the Rheumatology activity and complete the homunculus joint exam.  Investigation: No additional  findings.  Imaging: No results found.  Recent Labs: Lab Results  Component Value Date   WBC 4.1 09/30/2023   HGB 12.1 09/30/2023   PLT 247 09/30/2023   NA 139 09/30/2023   K 3.9 09/30/2023   CL 102 09/30/2023   CO2 26 09/30/2023   GLUCOSE 120 (H) 09/30/2023   BUN 14 09/30/2023   CREATININE 1.06 (H) 09/30/2023   BILITOT 0.8 09/30/2023   ALKPHOS 71 09/30/2023   AST 20 09/30/2023   ALT 13 09/30/2023   PROT 7.6 09/30/2023   ALBUMIN 4.2 09/30/2023   CALCIUM 9.9 09/30/2023    Speciality Comments: No specialty comments available.  Procedures:  No procedures performed Allergies: Shellfish allergy, Avocado, Codeine, Iodinated contrast media, Pineapple, and  Penicillins   Assessment / Plan:     Visit Diagnoses: Mixed connective tissue disease (HCC) - Positive ANA, positive SSA, positive SSB, positive RF, inflammatory arthritis,sicca symptoms:dxd Dr. Deanne Coffer 07/24. -Her symptoms are mostly of Sjogren's.  She continues to have dry mouth and dry eyes.  She denies any history of joint swelling.  She has been tolerating Imuran 100 mg daily.  She had no interruption in the treatment.  I will recheck labs in July.  Previous labs were reviewed with the patient.  Plan: Protein / creatinine ratio, urine, ANA, Anti-DNA antibody, double-stranded, C3 and C4, Sedimentation rate, Sjogrens syndrome-A extractable nuclear antibody in July.  High risk medication use - Imuran 100 mg p.o. daily (since August 2024) dose was reduced from 150 mg daily due to GI side effects.  (Previous treatment Simponi Aria infusions for RA Dr. Mervyn Skeeters -September 30, 2023 CBC was normal, CMP showed elevated creatinine most likely due to diuretic use.  Sed rate remains elevated.  Plan: CBC with Differential/Platelet, Comprehensive metabolic panel with GFR in July.  She was advised to get labs every 3 months.  Information immunization was placed in the AVS.  Pain in both hands -she continues to have some stiffness in her hands.  No synovitis  was noted.  X-ray obtained initially showed early osteoarthritic changes.  Joint protection was discussed.  Ischial bursitis of left side -she could history of pain and discomfort in her left hip which she described over the left ischial bursa.  She states she sits for many hours on a seat at home.  Frequent getting up and stretching exercises were demonstrated.  I will also refer her to physical therapy.  I offered cortisone injection which she declined.  Plan: Ambulatory referral to Physical Therapy  Chronic pain of both knees -she has stiffness and discomfort in her knee joints.  No warmth swelling or effusion was noted.  X-ray obtained in the past showed mild osteoarthritis and mild chondromalacia patella.  Pain in left ankle and joints of left foot -she has off-and-on discomfort in her feet.  No synovitis was noted.  X-rays were unremarkable.  She had bilateral lower extremity edema.  I believe the discomfort is coming from it.  Use of compression socks was discussed.  Primary osteoarthritis of both feet - Clinical and radiographic findings were suggestive of osteoarthritis.  Chronic midline low back pain without sciatica -she complains of some discomfort in the lower spine.  There was no point tenderness.  X-rays showed L5-S1 narrowing.  Primary hypertension-blood pressure was elevated at 152/94.  Repeat blood pressure was 135/84.  She was advised to monitor blood pressure closely and follow-up with her PCP.  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  She is under care of neurology and cardiology.  She close remains on anticoagulants.  Dyslipidemia-dietary modifications were discussed.  Other medical problems are listed as follows:  Chronic venous insufficiency  Hypertensive retinopathy of both eyes  Gastroesophageal reflux disease without esophagitis  Daytime somnolence  Other specified hypothyroidism  Reactive depression  History of asthma  History of DVT (deep vein thrombosis) - At  age 77 during pregnancy.  Family history of rheumatoid arthritis-great aunt  Orders: Orders Placed This Encounter  Procedures   Protein / creatinine ratio, urine   CBC with Differential/Platelet   Comprehensive metabolic panel with GFR   ANA   Anti-DNA antibody, double-stranded   C3 and C4   Sedimentation rate   Sjogrens syndrome-A extractable nuclear antibody   Ambulatory referral to  Physical Therapy   No orders of the defined types were placed in this encounter.    Follow-Up Instructions: Return in about 5 months (around 03/10/2024) for MCTD.   Pollyann Savoy, MD  Note - This record has been created using Animal nutritionist.  Chart creation errors have been sought, but may not always  have been located. Such creation errors do not reflect on  the standard of medical care.

## 2023-09-28 ENCOUNTER — Telehealth: Payer: Self-pay

## 2023-09-28 DIAGNOSIS — Z79899 Other long term (current) drug therapy: Secondary | ICD-10-CM

## 2023-09-28 DIAGNOSIS — M351 Other overlap syndromes: Secondary | ICD-10-CM

## 2023-09-28 NOTE — Telephone Encounter (Signed)
 Patient contacted the office and inquired if we could release her labs to [facility] so she can get them done before her next appointment. Please advise which autoimmune labs should be released for the patient to get done before the patient's next visit on 10/09/2023. Will contact patient once orders are placed and released.

## 2023-09-29 ENCOUNTER — Telehealth: Payer: Self-pay | Admitting: Rheumatology

## 2023-09-29 NOTE — Addendum Note (Signed)
 Addended by: Henriette Combs on: 09/29/2023 09:18 AM   Modules accepted: Orders

## 2023-09-29 NOTE — Telephone Encounter (Signed)
 CBC with differential, CMP with GFR, urine protein creatinine ratio, ANA, double-stranded DNA, C3-C4, SSA, RF, sed rate

## 2023-09-29 NOTE — Telephone Encounter (Signed)
Lab orders placed and released.  

## 2023-09-29 NOTE — Telephone Encounter (Signed)
 Patient called stating [facility] is unable to log into their system to see the labwork orders.  They are requesting the orders be faxed to [phone]  Patient states she already left their facility, but will go back in the morning.

## 2023-09-29 NOTE — Telephone Encounter (Signed)
 Patient advised.

## 2023-09-30 NOTE — Telephone Encounter (Signed)
Patient advised lab orders have been faxed.

## 2023-10-02 NOTE — Progress Notes (Signed)
 CBC normal, glucose mildly elevated probably not a fasting sample.  Creatinine is elevated.  Patient should increase water intake and avoid NSAIDs.  Sjogren's antibody SSA positive, rheumatoid factor positive, sed rate elevated, urine protein creatinine ratio normal, ANA pending, double-stranded ENA negative, complements normal.  I will discuss results at the follow-up visit.

## 2023-10-07 LAB — CBC WITH DIFFERENTIAL/PLATELET
Basophils Absolute: 0 x10E3/uL (ref 0.0–0.2)
Basos: 0 %
EOS (ABSOLUTE): 0.1 x10E3/uL (ref 0.0–0.4)
Eos: 2 %
Hematocrit: 36.8 % (ref 34.0–46.6)
Hemoglobin: 12.1 g/dL (ref 11.1–15.9)
Immature Grans (Abs): 0 x10E3/uL (ref 0.0–0.1)
Immature Granulocytes: 0 %
Lymphocytes Absolute: 0.6 x10E3/uL — ABNORMAL LOW (ref 0.7–3.1)
Lymphs: 15 %
MCH: 27.9 pg (ref 26.6–33.0)
MCHC: 32.9 g/dL (ref 31.5–35.7)
MCV: 85 fL (ref 79–97)
Monocytes Absolute: 0.4 x10E3/uL (ref 0.1–0.9)
Monocytes: 10 %
Neutrophils Absolute: 3 x10E3/uL (ref 1.4–7.0)
Neutrophils: 73 %
Platelets: 247 x10E3/uL (ref 150–450)
RBC: 4.34 x10E6/uL (ref 3.77–5.28)
RDW: 16.4 % — ABNORMAL HIGH (ref 11.7–15.4)
WBC: 4.1 x10E3/uL (ref 3.4–10.8)

## 2023-10-07 LAB — CMP14+EGFR
ALT: 13 IU/L (ref 0–32)
AST: 20 IU/L (ref 0–40)
Albumin: 4.2 g/dL (ref 3.8–4.9)
Alkaline Phosphatase: 71 IU/L (ref 44–121)
BUN/Creatinine Ratio: 13 (ref 12–28)
BUN: 14 mg/dL (ref 8–27)
Bilirubin Total: 0.8 mg/dL (ref 0.0–1.2)
CO2: 26 mmol/L (ref 20–29)
Calcium: 9.9 mg/dL (ref 8.7–10.3)
Chloride: 102 mmol/L (ref 96–106)
Creatinine, Ser: 1.06 mg/dL — ABNORMAL HIGH (ref 0.57–1.00)
Globulin, Total: 3.4 g/dL (ref 1.5–4.5)
Glucose: 120 mg/dL — ABNORMAL HIGH (ref 70–99)
Potassium: 3.9 mmol/L (ref 3.5–5.2)
Sodium: 139 mmol/L (ref 134–144)
Total Protein: 7.6 g/dL (ref 6.0–8.5)
eGFR: 60 mL/min/1.73

## 2023-10-07 LAB — RHEUMATOID FACTOR: Rheumatoid fact SerPl-aCnc: 37.9 [IU]/mL — ABNORMAL HIGH

## 2023-10-07 LAB — PROTEIN / CREATININE RATIO, URINE
Creatinine, Urine: 118.4 mg/dL
Protein, Ur: 12.7 mg/dL
Protein/Creat Ratio: 107 mg/g{creat} (ref 0–200)

## 2023-10-07 LAB — FANA STAINING PATTERNS: Speckled Pattern: >1:1280 {titer} — AB

## 2023-10-07 LAB — ANTI-DNA ANTIBODY, DOUBLE-STRANDED: dsDNA Ab: 3 [IU]/mL (ref 0–9)

## 2023-10-07 LAB — C3 AND C4
Complement C3, Serum: 113 mg/dL (ref 82–167)
Complement C4, Serum: 17 mg/dL (ref 12–38)

## 2023-10-07 LAB — ANTINUCLEAR ANTIBODIES, IFA: ANA Titer 1: POSITIVE — AB

## 2023-10-07 LAB — SEDIMENTATION RATE: Sed Rate: 42 mm/h — ABNORMAL HIGH (ref 0–40)

## 2023-10-07 LAB — SJOGRENS SYNDROME-A EXTRACTABLE NUCLEAR ANTIBODY: ENA SSA (RO) Ab: >8 AI — ABNORMAL HIGH (ref 0.0–0.9)

## 2023-10-09 ENCOUNTER — Encounter: Payer: Self-pay | Admitting: Rheumatology

## 2023-10-09 ENCOUNTER — Ambulatory Visit: Payer: Medicaid Other | Attending: Rheumatology | Admitting: Rheumatology

## 2023-10-09 VITALS — BP 135/84 | HR 76 | Resp 15 | Ht 65.0 in | Wt 203.0 lb

## 2023-10-09 DIAGNOSIS — K219 Gastro-esophageal reflux disease without esophagitis: Secondary | ICD-10-CM

## 2023-10-09 DIAGNOSIS — M25572 Pain in left ankle and joints of left foot: Secondary | ICD-10-CM

## 2023-10-09 DIAGNOSIS — M79641 Pain in right hand: Secondary | ICD-10-CM | POA: Diagnosis not present

## 2023-10-09 DIAGNOSIS — E785 Hyperlipidemia, unspecified: Secondary | ICD-10-CM

## 2023-10-09 DIAGNOSIS — H35033 Hypertensive retinopathy, bilateral: Secondary | ICD-10-CM

## 2023-10-09 DIAGNOSIS — Z8709 Personal history of other diseases of the respiratory system: Secondary | ICD-10-CM

## 2023-10-09 DIAGNOSIS — M25562 Pain in left knee: Secondary | ICD-10-CM

## 2023-10-09 DIAGNOSIS — I872 Venous insufficiency (chronic) (peripheral): Secondary | ICD-10-CM

## 2023-10-09 DIAGNOSIS — G8929 Other chronic pain: Secondary | ICD-10-CM

## 2023-10-09 DIAGNOSIS — M7072 Other bursitis of hip, left hip: Secondary | ICD-10-CM | POA: Diagnosis not present

## 2023-10-09 DIAGNOSIS — E038 Other specified hypothyroidism: Secondary | ICD-10-CM

## 2023-10-09 DIAGNOSIS — M19071 Primary osteoarthritis, right ankle and foot: Secondary | ICD-10-CM

## 2023-10-09 DIAGNOSIS — Z79899 Other long term (current) drug therapy: Secondary | ICD-10-CM | POA: Diagnosis not present

## 2023-10-09 DIAGNOSIS — M25561 Pain in right knee: Secondary | ICD-10-CM

## 2023-10-09 DIAGNOSIS — Z8261 Family history of arthritis: Secondary | ICD-10-CM

## 2023-10-09 DIAGNOSIS — I10 Essential (primary) hypertension: Secondary | ICD-10-CM

## 2023-10-09 DIAGNOSIS — M7062 Trochanteric bursitis, left hip: Secondary | ICD-10-CM

## 2023-10-09 DIAGNOSIS — M79642 Pain in left hand: Secondary | ICD-10-CM

## 2023-10-09 DIAGNOSIS — R400 Somnolence: Secondary | ICD-10-CM

## 2023-10-09 DIAGNOSIS — F329 Major depressive disorder, single episode, unspecified: Secondary | ICD-10-CM

## 2023-10-09 DIAGNOSIS — M351 Other overlap syndromes: Secondary | ICD-10-CM

## 2023-10-09 DIAGNOSIS — M545 Low back pain, unspecified: Secondary | ICD-10-CM

## 2023-10-09 DIAGNOSIS — I6381 Other cerebral infarction due to occlusion or stenosis of small artery: Secondary | ICD-10-CM

## 2023-10-09 DIAGNOSIS — Z86718 Personal history of other venous thrombosis and embolism: Secondary | ICD-10-CM

## 2023-10-09 DIAGNOSIS — M19072 Primary osteoarthritis, left ankle and foot: Secondary | ICD-10-CM

## 2023-10-09 NOTE — Patient Instructions (Addendum)
Standing Labs We placed an order today for your standing lab work.   Please have your standing labs drawn in July and every 3 months  Please have your labs drawn 2 weeks prior to your appointment so that the provider can discuss your lab results at your appointment, if possible.  Please note that you may see your imaging and lab results in MyChart before we have reviewed them. We will contact you once all results are reviewed. Please allow our office up to 72 hours to thoroughly review all of the results before contacting the office for clarification of your results.  WALK-IN LAB HOURS  Monday through Thursday from 8:00 am -12:30 pm and 1:00 pm-5:00 pm and Friday from 8:00 am-12:00 pm.  Patients with office visits requiring labs will be seen before walk-in labs.  You may encounter longer than normal wait times. Please allow additional time. Wait times may be shorter on  Monday and Thursday afternoons.  We do not book appointments for walk-in labs. We appreciate your patience and understanding with our staff.   Labs are drawn by [facility]. Please bring your co-pay at the time of your lab draw.  You may receive a bill from [facility] for your lab work.  Please note if you are on Hydroxychloroquine and and an order has been placed for a Hydroxychloroquine level,  you will need to have it drawn 4 hours or more after your last dose.  If you wish to have your labs drawn at another location, please call the office 24 hours in advance so we can fax the orders.  The office is located at [address], [address], [place], [place] [address]   If you have any questions regarding directions or hours of operation,  please call [phone].   As a reminder, please drink plenty of water prior to coming for your lab work. Thanks!   Vaccines You are taking a medication(s) that can suppress your immune system.  The following immunizations are recommended: Flu annually Covid-19  Td/Tdap (tetanus, diphtheria,  pertussis) every 10 years Pneumonia (Prevnar 15 then Pneumovax 23 at least 1 year apart.  Alternatively, can take Prevnar 20 without needing additional dose) Shingrix: 2 doses from 4 weeks to 6 months apart  Please check with your PCP to make sure you are up to date.   If you have signs or symptoms of an infection or start antibiotics: First, call your PCP for workup of your infection. Hold your medication through the infection, until you complete your antibiotics, and until symptoms resolve if you take the following: Injectable medication (Actemra, Benlysta, Cimzia, Cosentyx, Enbrel, Humira, Kevzara, Orencia, Remicade, Simponi, Stelara, Taltz, Tremfya) Methotrexate Leflunomide (Arava) Mycophenolate (Cellcept) Xeljanz, Olumiant, or Rinvoq  

## 2023-10-12 ENCOUNTER — Encounter: Payer: Self-pay | Admitting: Cardiology

## 2023-10-12 ENCOUNTER — Ambulatory Visit: Payer: Medicaid Other | Attending: Cardiology | Admitting: Cardiology

## 2023-10-12 ENCOUNTER — Other Ambulatory Visit (HOSPITAL_COMMUNITY): Payer: Self-pay

## 2023-10-12 VITALS — BP 140/86 | HR 78 | Resp 16 | Ht 65.0 in | Wt 203.2 lb

## 2023-10-12 DIAGNOSIS — I10 Essential (primary) hypertension: Secondary | ICD-10-CM

## 2023-10-12 DIAGNOSIS — E782 Mixed hyperlipidemia: Secondary | ICD-10-CM

## 2023-10-12 DIAGNOSIS — I2510 Atherosclerotic heart disease of native coronary artery without angina pectoris: Secondary | ICD-10-CM

## 2023-10-12 DIAGNOSIS — I2584 Coronary atherosclerosis due to calcified coronary lesion: Secondary | ICD-10-CM | POA: Diagnosis not present

## 2023-10-12 DIAGNOSIS — I6381 Other cerebral infarction due to occlusion or stenosis of small artery: Secondary | ICD-10-CM

## 2023-10-12 DIAGNOSIS — E876 Hypokalemia: Secondary | ICD-10-CM

## 2023-10-12 DIAGNOSIS — I700 Atherosclerosis of aorta: Secondary | ICD-10-CM | POA: Diagnosis not present

## 2023-10-12 DIAGNOSIS — Z8673 Personal history of transient ischemic attack (TIA), and cerebral infarction without residual deficits: Secondary | ICD-10-CM

## 2023-10-12 MED ORDER — POTASSIUM CHLORIDE ER 10 MEQ PO CPCR: 10.0000 meq | ORAL_CAPSULE | Freq: Two times a day (BID) | ORAL | Status: DC

## 2023-10-12 MED ORDER — SPIRONOLACTONE 25 MG PO TABS
25.0000 mg | ORAL_TABLET | Freq: Every day | ORAL | 3 refills | Status: DC
  Filled 2023-10-12: qty 30, 30d supply, fill #0

## 2023-10-12 NOTE — Progress Notes (Signed)
 Cardiology Office Note:  .   Date:  10/12/2023  ID:  Evelyn Baker, DOB [birth year], MRN [id number] PCP:  Jarrett Soho, PA-C  Former Cardiology Providers: None  [facility] Providers Cardiologist:  Tessa Lerner, DO , [facility] (established care 02/12/2023) Electrophysiologist:  None  Click to update primary MD,subspecialty MD or APP then REFRESH:1}    Chief Complaint  Patient presents with   Coronary atherosclerosis due to calcified coronary lesion   Follow-up    History of Present Illness: .   Evelyn Baker is a 63 y.o. African-American female whose past medical history and cardiovascular risk factors includes: Recent TIA, lacunar stroke (MRI of the brain July 2024), hypothyroidism, hypertension, GERD, rheumatoid arthritis, coronary artery calcification noted on nongated CT study, aortic atherosclerosis.   Patient was referred to the practice given the degree of coronary artery calcification and recent TIA.  Patient recently had a CT of the chest and was noted to have both pulmonary and cardiac findings.  She had coronary artery and aortic atherosclerosis on nongated CT study and referred to cardiology for further evaluation management.  In the interim she develops stroke symptoms and was diagnosed with TIA at her prior ER visit.  During the stroke workup she had an MRI of the brain which noted a right internal capsule lacunar infarct.  Neurology recommended echocardiogram and cardiac monitor for further evaluation. Her triamterene/hydrochlorothiazide was discontinued and transition to chlorthalidone and valsartan.  Since last office visit she is doing well from a cardiovascular standpoint.  Denies anginal chest pain or heart failure symptoms.  Overall functional capacity remains stable.  At times she experiences intermittent fluttering/palpitations which are short-lived and not any worse than before when she had the cardiac monitor.  Since last office visit we also increased her  Crestor to 20 mg p.o. nightly, fasting lipids are not available today.  Review of Systems: .   Review of Systems  Cardiovascular:  Negative for chest pain, claudication, dyspnea on exertion, irregular heartbeat, leg swelling, near-syncope, orthopnea, palpitations, paroxysmal nocturnal dyspnea and syncope.  Respiratory:  Negative for shortness of breath.   Hematologic/Lymphatic: Negative for bleeding problem.  Musculoskeletal:  Negative for muscle cramps and myalgias.  Neurological:  Negative for dizziness and light-headedness.    Studies Reviewed:   EKG: EKG Interpretation Date/Time:  Monday October 12 2023 08:28:07 EDT Ventricular Rate:  78 PR Interval:  160 QRS Duration:  70 QT Interval:  378 QTC Calculation: 430 R Axis:   14  Text Interpretation: Normal sinus rhythm Low voltage QRS When compared with ECG of 22-Jan-2021 07:41, No significant change was found Confirmed by Tessa Lerner [phone]) on 10/12/2023 8:36:03 AM  Echocardiogram: 03/17/2023: LVEF 55 to 60%, grade 1 diastolic dysfunction, right ventricular size and function normal, trivial MR, estimated RAP 8 mmHg, agitated saline contrast negative for interatrial shunt  Stress Testing: Exercise Sestamibi stress test 03/05/2023: Exercise nuclear stress test was performed using Bruce protocol.  1 Day Rest and Stress images. Exercise time 4 minutes 30 seconds on Bruce protocol, achieved 6.53 METS, 99 % of APMHR. Stress ECG Equivocal for ischemia (peak stress ECG uninterpretable due to motion, however, 1 minute into recovery ST-T changes does not illustrate ischemia).  Normal myocardial perfusion with breast attenuation artifact.  No obvious evidence of reversible myocardial ischemia or prior infarct. Left ventricular size normal, wall thickness preserved, no obvious regional wall motion abnormalities.  Low risk study.   Cardiac monitor (Zio Patch): 02/17/2023 - 03/03/2023 Dominant rhythm sinus, followed by  tachycardia (burden  16%). Heart rate 48-167 bpm.  Avg HR 85 bpm. No atrial fibrillation detected during the monitoring period. No supraventricular tachycardia,  high grade AV block, pauses (3 seconds or longer). One asymptomatic episode of NSVT, 02/26/2023, at 4:56 AM, 8 beats, 3.3 seconds, max heart rate 167 bpm, average HR 154 bpm. Total supraventricular ectopic burden <1%. Total ventricular ectopic burden <1%. Patient triggered events: 0.  RADIOLOGY: CT chest without contrast 09/08/2022 1. 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 Further clinical evaluation is recommended. Strictly speaking, interstitial lung disease is not entirely excluded, but not favored on the basis of today's examination. Repeat high-resolution chest CT should be considered in 12 months to assess for temporal changes in the appearance of the lung parenchyma. 2. Aortic atherosclerosis, in addition to two-vessel coronary artery disease. Please note that although the presence of coronary artery calcium documents the presence of coronary artery disease, the severity of this disease and any potential stenosis cannot be assessed on this non-gated CT examination. Assessment for potential risk factor modification, dietary therapy or pharmacologic therapy may be warranted, if clinically indicated. 3. Trace volume of pericardial fluid and/or thickening, unlikely of hemodynamic significance at this time.   Aortic Atherosclerosis (ICD10-I70.0).   MRI of the brain without contrast 01/22/2023: 1. 12 mm acute/subacute nonhemorrhagic infarct involving the posterior body of the right caudate and corona radiata. 2. Otherwise normal MRI appearance the brain. 3. Bilateral mastoid effusions. No obstructing nasopharyngeal lesion is present.  Risk Assessment/Calculations:   NA   Labs:       Latest Ref Rng & Units 09/30/2023    1:44 PM  08/25/2023    2:45 PM 05/27/2023    9:32 AM  CBC  WBC 3.4 - 10.8 x10E3/uL 4.1  4.5  3.9   Hemoglobin 11.1 - 15.9 g/dL 16.1  09.6  04.5   Hematocrit 34.0 - 46.6 % 36.8  35.9  36.9   Platelets 150 - 450 x10E3/uL 247  255  240        Latest Ref Rng & Units 09/30/2023    1:44 PM 08/25/2023    2:45 PM 07/10/2023    9:09 AM  BMP  Glucose 70 - 99 mg/dL 409  89  99   BUN 8 - 27 mg/dL 14  15  22    Creatinine 0.57 - 1.00 mg/dL 8.11  9.14  7.82   BUN/Creat Ratio 12 - 28 13  16  23    Sodium 134 - 144 mmol/L 139  139  141   Potassium 3.5 - 5.2 mmol/L 3.9  3.8  3.6   Chloride 96 - 106 mmol/L 102  101  102   CO2 20 - 29 mmol/L 26  25  26    Calcium 8.7 - 10.3 mg/dL 9.9  9.8  9.5       Latest Ref Rng & Units 09/30/2023    1:44 PM 08/25/2023    2:45 PM 07/10/2023    9:09 AM  CMP  Glucose 70 - 99 mg/dL 956  89  99   BUN 8 - 27 mg/dL 14  15  22    Creatinine 0.57 - 1.00 mg/dL 2.13  0.86  5.78   Sodium 134 - 144 mmol/L 139  139  141   Potassium 3.5 - 5.2 mmol/L 3.9  3.8  3.6   Chloride 96 - 106 mmol/L 102  101  102   CO2  20 - 29 mmol/L 26  25  26    Calcium 8.7 - 10.3 mg/dL 9.9  9.8  9.5   Total Protein 6.0 - 8.5 g/dL 7.6   7.1   Total Bilirubin 0.0 - 1.2 mg/dL 0.8   0.5   Alkaline Phos 44 - 121 IU/L 71   73   AST 0 - 40 IU/L 20   18   ALT 0 - 32 IU/L 13   13     Lab Results  Component Value Date   CHOL 147 07/10/2023   HDL 44 07/10/2023   LDLCALC 87 07/10/2023   LDLDIRECT 92 04/21/2023   TRIG 81 07/10/2023   CHOLHDL 3.3 07/10/2023   No results for input(s): "LIPOA" in the last 8760 hours. No components found for: "NTPROBNP" No results for input(s): "PROBNP" in the last 8760 hours. No results for input(s): "TSH" in the last 8760 hours.    Physical Exam:    Today's Vitals   10/12/23 0823  BP: (!) 140/86  Pulse: 78  Resp: 16  SpO2: 98%  Weight: 203 lb 3.2 oz (92.2 kg)  Height: 5\' 5"  (1.651 m)   Body mass index is 33.81 kg/m. Wt Readings from Last 3 Encounters:  10/12/23 203  lb 3.2 oz (92.2 kg)  10/09/23 203 lb (92.1 kg)  08/12/23 205 lb 9.6 oz (93.3 kg)    Physical Exam  Constitutional: No distress.  hemodynamically stable  Neck: No JVD present.  Cardiovascular: Normal rate, regular rhythm, S1 normal, S2 normal, intact distal pulses and normal pulses. Exam reveals no gallop, no S3 and no S4.  No murmur heard. Pulmonary/Chest: Effort normal and breath sounds normal. No stridor. She has no wheezes. She has no rales.  Abdominal: Soft. Bowel sounds are normal. She exhibits no distension. There is no abdominal tenderness.  Musculoskeletal:        General: No edema.     Cervical back: Neck supple.  Neurological: She is alert and oriented to person, place, and time. She has intact cranial nerves (2-12).  Skin: Skin is warm and moist.    Impression & Recommendation(s):  Impression:   ICD-10-CM   1. Coronary atherosclerosis due to calcified coronary lesion  I25.10 EKG 12-Lead   I25.84     2. Aortic atherosclerosis (HCC)  I70.0     3. Primary hypertension  I10 spironolactone (ALDACTONE) 25 MG tablet    4. Hx-TIA (transient ischemic attack)  Z86.73     5. Lacunar infarction (HCC)  I63.81     6. Mixed hyperlipidemia  E78.2 Lipid panel    Comprehensive metabolic panel with GFR    Comprehensive metabolic panel with GFR    Lipid panel    7. Hypokalemia  E87.6 potassium chloride (MICRO-K) 10 MEQ CR capsule       Recommendation(s):  Coronary atherosclerosis due to calcified coronary lesion Aortic atherosclerosis (HCC) Denies anginal chest pain or heart failure symptoms. Noted to have coronary artery calcification on a nongated CT study. Currently on antiplatelet therapy and statin. Increase Crestor in January 2025 follow-up labs are pending. Echocardiogram: preserved LVEF without any significant valvular heart disease and bubble study negative Stress test-low risk Reemphasized the importance of secondary prevention with focus on improving her  modifiable cardiovascular risk factors such as glycemic control, lipid management, blood pressure control, weight loss.  Primary hypertension Hypokalemia Currently her home blood pressure ranges between 126-135 mmHg. Office blood pressures are not at goal, did not take her morning medications She takes chlorthalidone  25 mg p.o. every morning.  And valsartan 160 mg p.o. every afternoon.  In addition she takes potassium chloride 2 tablets twice daily.  Her most recent labs from April 2025 notes potassium levels to be 3.9.  To further simplify her medications the following is proposed. Will discontinue chlorthalidone. Start spironolactone 25 mg p.o. every morning. Reduce potassium to 10 mEq twice daily Check BMP in 1 week Patient to keep a log of her blood pressures at home. If further antihypertensive medications are warranted we will go up on valsartan with repeat BMP as well.  Hx-TIA (transient ischemic attack) Lacunar infarction [facility]) MRI findings of a lacunar infarct. Currently on antiplatelet therapies and statin. Zio patch results did not illustrate atrial fibrillation during the monitoring period. Re emphasize importance of blood pressure management. Reemphasized the importance of secondary prevention with focus on improving her modifiable cardiovascular risk factors such as glycemic control, lipid management, blood pressure control, weight loss.  Hyperlipidemia, mixed: Currently on Crestor 20 mEq p.o. daily. Check fasting lipid profile.  Orders Placed:  Orders Placed This Encounter  Procedures   Lipid panel    Standing Status:   Future    Number of Occurrences:   1    Expected Date:   10/19/2023    Expiration Date:   10/11/2024   Comprehensive metabolic panel with GFR    Standing Status:   Future    Number of Occurrences:   1    Expected Date:   10/19/2023    Expiration Date:   10/11/2024   EKG 12-Lead   Independently reviewed progress notes that are available in epic since  last office visit, EKG ordered and independently reviewed, labs from April 2025 independently reviewed.  Prescription drug management.  Final Medication List:    Meds ordered this encounter  Medications   spironolactone (ALDACTONE) 25 MG tablet    Sig: Take 1 tablet (25 mg total) by mouth daily.    Dispense:  30 tablet    Refill:  3   potassium chloride (MICRO-K) 10 MEQ CR capsule    Sig: Take 1 capsule (10 mEq total) by mouth 2 (two) times daily.    Medications Discontinued During This Encounter  Medication Reason   chlorthalidone (HYGROTON) 25 MG tablet Discontinued by provider   potassium chloride (MICRO-K) 10 MEQ CR capsule       Current Outpatient Medications:    albuterol (VENTOLIN HFA) 108 (90 Base) MCG/ACT inhaler, Inhale 2 puffs into the lungs every 4 (four) hours as needed for wheezing or shortness of breath., Disp: , Rfl:    azathioprine (IMURAN) 100 MG tablet, Take 1 tablet (100 mg total) by mouth daily as directed, Disp: 90 tablet, Rfl: 0   budesonide-formoterol (SYMBICORT) 80-4.5 MCG/ACT inhaler, Inhale 2 puffs into the lungs in the morning and at bedtime., Disp: 10.2 g, Rfl: 11   Cholecalciferol (VITAMIN D-3 PO), Take by mouth., Disp: , Rfl:    clopidogrel (PLAVIX) 75 MG tablet, Take 1 tablet (75 mg total) by mouth daily., Disp: 30 tablet, Rfl: 11   Cyanocobalamin (VITAMIN B-12 PO), Take by mouth., Disp: , Rfl:    EPINEPHrine 0.3 mg/0.3 mL IJ SOAJ injection, Inject 0.3 mg into the muscle as needed for anaphylaxis., Disp: , Rfl:    levothyroxine (SYNTHROID) 125 MCG tablet, Take 1 tablet (125 mcg total) by mouth every morning on an empty stomach., Disp: 90 tablet, Rfl: 1   rosuvastatin (CRESTOR) 20 MG tablet, Take 1 tablet (20 mg total) by mouth  daily., Disp: 90 tablet, Rfl: 3   spironolactone (ALDACTONE) 25 MG tablet, Take 1 tablet (25 mg total) by mouth daily., Disp: 30 tablet, Rfl: 3   valsartan (DIOVAN) 160 MG tablet, Take 1 tablet (160 mg total) by mouth daily., Disp:  90 tablet, Rfl: 2   potassium chloride (MICRO-K) 10 MEQ CR capsule, Take 1 capsule (10 mEq total) by mouth 2 (two) times daily., Disp: , Rfl:   Consent:   N/A  Disposition:   Follow-up in 6 months   Her questions and concerns were addressed to her satisfaction. She voices understanding of the recommendations provided during this encounter.    Signed, Olinda Bertrand, DO, [facility] [place]  [facility]  [address] [place], [place] [address] 10/12/2023 10:45 AM

## 2023-10-12 NOTE — Patient Instructions (Signed)
 Medication Instructions:  Your physician has recommended you make the following change in your medication:   STOP Chlorthalidone   START Spironolactone 25 mg once daily in the morning   DECREASE Potassium to 1 tablet twice daily    *If you need a refill on your cardiac medications before your next appointment, please call your pharmacy*  Lab Work: To be completed in 1 week: FASTING lipid panel, CMP  If you have labs (blood work) drawn today and your tests are completely normal, you will receive your results only by: MyChart Message (if you have MyChart) OR A paper copy in the mail If you have any lab test that is abnormal or we need to change your treatment, we will call you to review the results.  Testing/Procedures: None ordered today.  Follow-Up: At [facility], you and your health needs are our priority.  As part of our continuing mission to provide you with exceptional heart care, we have created designated Provider Care Teams.  These Care Teams include your primary Cardiologist (physician) and Advanced Practice Providers (APPs -  Physician Assistants and Nurse Practitioners) who all work together to provide you with the care you need, when you need it.  Your next appointment:   6 month(s) (October 2025)  The format for your next appointment:   In Person  Provider:   Olinda Bertrand, [facility]  Other Instructions    1st Floor: - Lobby - Registration  - Pharmacy  - Lab - Cafe  2nd Floor: - PV Lab - Diagnostic Testing (echo, CT, nuclear med)  3rd Floor: - Vacant  4th Floor: - TCTS (cardiothoracic surgery) - AFib Clinic - Structural Heart Clinic - Vascular Surgery  - Vascular Ultrasound  5th Floor: - HeartCare Cardiology (general and EP) - Clinical Pharmacy for coumadin, hypertension, lipid, weight-loss medications, and med management appointments    Valet parking services will be available as well.

## 2023-10-22 ENCOUNTER — Other Ambulatory Visit (HOSPITAL_COMMUNITY): Payer: Self-pay

## 2023-10-23 ENCOUNTER — Other Ambulatory Visit (HOSPITAL_COMMUNITY): Payer: Self-pay

## 2023-10-23 LAB — COMPREHENSIVE METABOLIC PANEL WITH GFR
ALT: 13 IU/L (ref 0–32)
AST: 18 IU/L (ref 0–40)
Albumin: 3.9 g/dL (ref 3.8–4.9)
Alkaline Phosphatase: 69 IU/L (ref 44–121)
BUN/Creatinine Ratio: 19 (ref 12–28)
BUN: 17 mg/dL (ref 8–27)
Bilirubin Total: 0.7 mg/dL (ref 0.0–1.2)
CO2: 24 mmol/L (ref 20–29)
Calcium: 9.9 mg/dL (ref 8.7–10.3)
Chloride: 104 mmol/L (ref 96–106)
Creatinine, Ser: 0.9 mg/dL (ref 0.57–1.00)
Globulin, Total: 3.5 g/dL (ref 1.5–4.5)
Glucose: 88 mg/dL (ref 70–99)
Potassium: 4.3 mmol/L (ref 3.5–5.2)
Sodium: 141 mmol/L (ref 134–144)
Total Protein: 7.4 g/dL (ref 6.0–8.5)
eGFR: 73 mL/min/{1.73_m2} (ref >59)

## 2023-10-23 LAB — LIPID PANEL
Chol/HDL Ratio: 2.8 ratio (ref 0.0–4.4)
Cholesterol, Total: 147 mg/dL (ref 100–199)
HDL: 53 mg/dL (ref >39)
LDL Chol Calc (NIH): 80 mg/dL (ref 0–99)
Triglycerides: 68 mg/dL (ref 0–149)
VLDL Cholesterol Cal: 14 mg/dL (ref 5–40)

## 2023-10-24 ENCOUNTER — Encounter: Payer: Self-pay | Admitting: Cardiology

## 2023-10-27 ENCOUNTER — Other Ambulatory Visit: Payer: Self-pay

## 2023-10-27 ENCOUNTER — Other Ambulatory Visit (HOSPITAL_COMMUNITY): Payer: Self-pay

## 2023-10-27 DIAGNOSIS — I10 Essential (primary) hypertension: Secondary | ICD-10-CM

## 2023-10-27 MED ORDER — VALSARTAN 320 MG PO TABS
320.0000 mg | ORAL_TABLET | Freq: Every day | ORAL | 3 refills | Status: DC
  Filled 2023-10-27: qty 30, 30d supply, fill #0
  Filled 2023-11-20: qty 30, 30d supply, fill #1
  Filled 2023-12-18: qty 30, 30d supply, fill #2
  Filled 2024-01-18: qty 30, 30d supply, fill #3

## 2023-10-27 NOTE — Telephone Encounter (Signed)
 Saw the labs from 10/23/2023 renal function and potassium are stable. Continue potassium to 10 mEq twice daily.   Increase valsartan  to 320mg  po qday and repeat BMP in one week. Continue to monitor BP.   Jonell Krontz Hettinger, DO, [facility]

## 2023-11-02 ENCOUNTER — Encounter (HOSPITAL_BASED_OUTPATIENT_CLINIC_OR_DEPARTMENT_OTHER): Payer: Self-pay | Admitting: Physical Therapy

## 2023-11-02 ENCOUNTER — Other Ambulatory Visit: Payer: Self-pay

## 2023-11-02 ENCOUNTER — Ambulatory Visit (HOSPITAL_BASED_OUTPATIENT_CLINIC_OR_DEPARTMENT_OTHER): Attending: Rheumatology | Admitting: Physical Therapy

## 2023-11-02 DIAGNOSIS — R262 Difficulty in walking, not elsewhere classified: Secondary | ICD-10-CM | POA: Insufficient documentation

## 2023-11-02 DIAGNOSIS — M545 Low back pain, unspecified: Secondary | ICD-10-CM | POA: Insufficient documentation

## 2023-11-02 DIAGNOSIS — M6281 Muscle weakness (generalized): Secondary | ICD-10-CM | POA: Insufficient documentation

## 2023-11-02 DIAGNOSIS — M7072 Other bursitis of hip, left hip: Secondary | ICD-10-CM | POA: Insufficient documentation

## 2023-11-02 DIAGNOSIS — M25552 Pain in left hip: Secondary | ICD-10-CM | POA: Diagnosis present

## 2023-11-02 NOTE — Therapy (Signed)
 OUTPATIENT PHYSICAL THERAPY LOWER EXTREMITY EVALUATION   Patient Name: Evelyn Baker MRN: [id number] DOB:1961-01-30, 63 y.o., female Today's Date: 11/02/2023  END OF SESSION:  PT End of Session - 11/02/23 1154     Visit Number 1    Number of Visits 19    Date for PT Re-Evaluation 01/31/24    Authorization Type [place] [employer]    PT Start Time 1145    PT Stop Time 1230    PT Time Calculation (min) 45 min    Activity Tolerance Patient tolerated treatment well;Patient limited by pain    Behavior During Therapy WFL for tasks assessed/performed             Past Medical History:  Diagnosis Date   Asthma    Depression    Dyspnea    Hyperlipidemia    Hypertension    Hypothyroidism    Thyroid  disease    Past Surgical History:  Procedure Laterality Date   FOOT SURGERY Left    STRABISMUS SURGERY     age 32   TOTAL ABDOMINAL HYSTERECTOMY     VEIN LIGATION AND STRIPPING Left 01/22/2021   Procedure: LEFT GREATER SAPHENOUS VEIN  LIGATION AND STRIPPING;  Surgeon: Richrd Char, MD;  Location: [facility] OR;  Service: Vascular;  Laterality: Left;   Patient Active Problem List   Diagnosis Date Noted   Mixed connective tissue disease (HCC) 10/09/2023   Primary osteoarthritis of both feet 10/09/2023   Dyspareunia in female 05/05/2022   Abnormal urine 05/05/2022   Chronic constipation 05/05/2022   Fibrocystic disease of breast 05/05/2022   Obesity 05/05/2022   Reactive depression (situational) 05/05/2022   Chronic venous insufficiency 02/13/2020   Leg edema, left 02/13/2020    PCP: Darnelle Elders, PA-C   REFERRING PROVIDER:  Nicholas Bari, MD     REFERRING DIAG:  M70.72 (ICD-10-CM) - Ischial bursitis of left side     THERAPY DIAG:  Pain in left hip  Pain, lumbar region  Muscle weakness (generalized)  Difficulty walking  Rationale for Evaluation and Treatment: Rehabilitation  ONSET DATE: January 2025  SUBJECTIVE:   SUBJECTIVE STATEMENT:   Pt states has  difficulty sitting due to pain in her left buttock which radiates into the back of her thigh. She cannot lay flat on her back or sleep- direct pressure causes pain. Pt does not remember specific MOI. Pt cannot reach into her cabinet. Pt does have NT that does go into the toes. Pt states that walking does not bother her hip but her back pain increases. Pt states the pain worsens at night once she relaxes. Pt denies cancer red flags.   Pt states she did not have much relief from left trochanteric bursa injection. Pt reports pain with bending fwd to pick something up. She can perform in a golfer's pick up position but not with L hip flexion position. Denies leg weakness and only pain.   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 She reports no remaining deficits. This occurred 2024  Pt has trip coming up on 26th with lots of sitting/driving.   PERTINENT HISTORY: Bilat bunions, asthma, depression, HTN, OA, mixed connective tissue disease  PAIN:  Are you having pain? Yes: NPRS scale: 9/10;  Pain location: L hip down to back of the thigh Pain description: sharp stabbing Aggravating factors: squatting, standing for long periods, figure 4, stairs  Relieving factors: crossing the leg, heating pad   PRECAUTIONS: None  RED  FLAGS: None   WEIGHT BEARING RESTRICTIONS: No  FALLS:  Has patient fallen in last 6 months? Yes. Number of falls 1 on icy  driveway not related to hip  LIVING ENVIRONMENT: Lives with: lives with their spouse Lives in: House/apartment Stairs: yes but has stair lift Has following equipment at home: None  OCCUPATION: retired from [employer]   PLOF: Independent  PATIENT GOALS: return to normal activity; gardening, walking for exercise, walking in the park    OBJECTIVE:  Note: Objective measures were completed at Evaluation unless otherwise noted.  DIAGNOSTIC FINDINGS:   XR Lumbar  Lumbar lordosis was noted.  No significant  disc space narrowing was noted.   L5-S1 narrowing was noted.  No facet joint arthropathy was noted.   Impression: L5-S1 narrowing was noted.   PATIENT SURVEYS:  Modified Oswestry Low Back Pain Disability Questionnaire: 41 / 50 = 82.0 %  LEFS: Lower Extremity Functional Score: 6 / 80 = 7.5 %  COGNITION: Overall cognitive status: Within functional limits for tasks assessed     SENSATION: WFL  POSTURE: rounded shoulders, forward head, increased thoracic kyphosis, and anterior pelvic tilt  PALPATION:  TTP and hypertonicity of L glute lumbar paraspinals and QL; hypertonicity and TTP of L posterior hip/glutes/hip rotators   LUMBAR ROM:   Active  A/PROM  eval  Flexion 75% p! Into L hip and back  Extension 90%  Right lateral flexion 90%  Left lateral flexion 90%  Right rotation 90%  Left rotation 90%   (Blank rows = not tested)   LOWER EXTREMITY ROM: symmetrical and without pain at bilat hips with PROM; AROM limited and very painful on L at the hip with IR/ER and SLR flexion  LOWER EXTREMITY MMT:  MMT Right eval Left eval  Hip flexion 4+/5 4/5 p!  Hip extension    Hip abduction 4+/5 4/5 p!  Hip adduction    Hip internal rotation    Hip external rotation 4+/5 4/5 p!  Knee flexion    Knee extension    Ankle dorsiflexion 5/5 5/5  Ankle plantarflexion    Ankle inversion    Ankle eversion     (Blank rows = not tested)  LOWER EXTREMITY SPECIAL TESTS:  Hip special tests: Portia Brittle (FABER) test: positive , SI compression test: positive , SI distraction test: negative, Hip scouring test: positive , and Piriformis test: positive   Lumbar: slump (positive)  FUNCTIONAL TESTS:  5 times sit to stand: requires UE to perform; not timed given UE support needed  GAIT: Distance walked: 21ft Assistive device utilized: None Level of assistance: Complete Independence Comments: antalgic on L                                                                                                                                TREATMENT DATE: 5/5  Starting a walking program   Exercises - Hooklying Clamshell with Resistance  - 2 x daily - 7  x weekly - 2 sets - 10 reps - 3-5s hold - Supine Posterior Pelvic Tilt  - 2 x daily - 7 x weekly - 2 sets - 10 reps - 2 hold - Supine Bridge  - 2 x daily - 7 x weekly - 2 sets - 10 reps - Supine Sciatic Nerve Glide  - 2 x daily - 7 x weekly - 1 sets - 10 reps - 2 hold     PATIENT EDUCATION:  Education details: MOI, diagnosis, prognosis, anatomy, acceptable levels of pain, exercise progression, DOMS expectations, muscle firing,  envelope of function, HEP, POC   Person educated: Patient Education method: Explanation, Demonstration, Tactile cues, Verbal cues, and Handouts Education comprehension: verbalized understanding, returned demonstration, verbal cues required, and tactile cues required   HOME EXERCISE PROGRAM:    Access Code: [email or web address] URL: [email or web address] Date: 11/02/2023 Prepared by: Silver Dross  ASSESSMENT:   CLINICAL IMPRESSION: Patient is a 63 y.o. female who was seen today for physical therapy evaluation and treatment for c/c of L hip pain and LBP. Pt's s/s appear consistent with a "piriformis syndrome" type pain with concurrent L sided lumbar radiculopathy.  Clinical testing does not suggest internal derangement at this time.  Pt's pain is highly sensitive and irritable with movement but does respond well to exercise with reduction of pain during movement. Plan to continue with L desensitization, functional lumbopelvic strength, progressive hip ABD and ER strength, and L hip motor/control stability at future sessions. Pt would benefit from continued skilled therapy in order to reach goals and maximize functional R LE strength and ROM for return to ADL, exercise, and recreation.    OBJECTIVE IMPAIRMENTS: decreased activity tolerance, decreased balance, decreased endurance, decreased mobility, decreased ROM,  decreased strength, hypomobility, increased muscle spasms, impaired flexibility, improper body mechanics, postural dysfunction, and pain.    ACTIVITY LIMITATIONS: lifting, squatting, locomotion level, and dressing   PARTICIPATION LIMITATIONS: interpersonal relationship, community activity, occupation, and exercise   PERSONAL FACTORS: Age, fitness, past/current experiences, Time since onset of injury/illness/exacerbation, and 2+ comorbidities are also affecting patient's functional outcome.    REHAB POTENTIAL: Good   CLINICAL DECISION MAKING: unstable/complicated   EVALUATION COMPLEXITY: Moderate     GOALS:     SHORT TERM GOALS: Target date: 12/14/2023        Pt will become independent with HEP in order to demonstrate synthesis of PT education.   Goal status: INITIAL   2.  Pt will have an at least 18 pt improvement in LEFS measure in order to demonstrate MCID improvement in daily function.    Goal status: INITIAL   3.  Pt will report at least 2 pt reduction on NPRS scale for pain in order to demonstrate functional improvement with household activity, self care, and ADL.    Goal status: INITIAL   LONG TERM GOALS: Target date: 01/31/2024      Pt  will become independent with final HEP in order to demonstrate synthesis of PT education.   Goal status: INITIAL   2.  Pt will demonstrate at least a 12.8 improvement in Modified Oswestry Index in order to demonstrate a clinically significant change in LBP and function.    Goal status: INITIAL   3.  Pt will be able to demonstrate/report ability to sit/stand/sleep for extended periods of time without pain in order to demonstrate functional improvement and tolerance to static positioning.     Goal status: INITIAL   4. Pt will be able to demonstrate/report ability to walk community/grocery  store distances without pain in order to demonstrate functional improvement and tolerance to exercise and community mobility.    Goal status:  INITIAL  5. Pt will have an at least 27 pt improvement in LEFS measure in order to demonstrate MCID improvement in daily function.    Goal status: INITIAL       PLAN:   PT FREQUENCY: 1-2x/week   PT DURATION: 12 weeks    PLANNED INTERVENTIONS: Therapeutic exercises, Therapeutic activity, Neuromuscular re-education, Balance training, Gait training, Patient/Family education, Self Care, Joint mobilization, Joint manipulation, Stair training, Aquatic Therapy, Dry Needling, Electrical stimulation, Spinal manipulation, Spinal mobilization, Cryotherapy, Moist heat, scar mobilization, Splintting, Taping, Vasopneumatic device, Traction, Ultrasound, Ionotophoresis 4mg /ml Dexamethasone , Manual therapy, and Re-evaluation   PLAN FOR NEXT SESSION: review HEP, active warm up, consider manual though pt very sensitive to touch; longer duration isometric holds at hip, curl ups in neutral position, QL stretching, weighted SB   Silver Dross, PT 11/02/2023, 1:04 PM

## 2023-11-05 ENCOUNTER — Encounter: Payer: Self-pay | Admitting: Cardiology

## 2023-11-07 LAB — BASIC METABOLIC PANEL WITH GFR
BUN/Creatinine Ratio: 21 (ref 12–28)
BUN: 21 mg/dL (ref 8–27)
CO2: 25 mmol/L (ref 20–29)
Calcium: 9.9 mg/dL (ref 8.7–10.3)
Chloride: 100 mmol/L (ref 96–106)
Creatinine, Ser: 0.99 mg/dL (ref 0.57–1.00)
Glucose: 131 mg/dL — ABNORMAL HIGH (ref 70–99)
Potassium: 4 mmol/L (ref 3.5–5.2)
Sodium: 140 mmol/L (ref 134–144)
eGFR: 65 mL/min/{1.73_m2} (ref >59)

## 2023-11-09 ENCOUNTER — Other Ambulatory Visit (HOSPITAL_COMMUNITY): Payer: Self-pay

## 2023-11-09 MED ORDER — LEVOTHYROXINE SODIUM 112 MCG PO TABS
112.0000 ug | ORAL_TABLET | Freq: Every morning | ORAL | 5 refills | Status: AC
  Filled 2023-11-09 – 2023-12-18 (×2): qty 30, 30d supply, fill #0
  Filled 2024-04-14: qty 30, 30d supply, fill #1
  Filled 2024-04-15: qty 90, 90d supply, fill #1
  Filled 2024-04-15: qty 30, 30d supply, fill #1
  Filled 2024-07-17: qty 90, 90d supply, fill #2

## 2023-11-09 MED ORDER — LEVOTHYROXINE SODIUM 112 MCG PO TABS
112.0000 ug | ORAL_TABLET | Freq: Every morning | ORAL | 5 refills | Status: DC
  Filled 2023-11-09 (×2): qty 30, 30d supply, fill #0
  Filled 2024-01-18: qty 30, 30d supply, fill #1
  Filled 2024-01-18: qty 90, 90d supply, fill #1

## 2023-11-10 ENCOUNTER — Ambulatory Visit (HOSPITAL_BASED_OUTPATIENT_CLINIC_OR_DEPARTMENT_OTHER): Admitting: Physical Therapy

## 2023-11-10 ENCOUNTER — Other Ambulatory Visit: Payer: Self-pay | Admitting: Endocrinology

## 2023-11-10 ENCOUNTER — Telehealth: Payer: Self-pay

## 2023-11-10 ENCOUNTER — Telehealth: Payer: Self-pay | Admitting: Internal Medicine

## 2023-11-10 DIAGNOSIS — M25552 Pain in left hip: Secondary | ICD-10-CM | POA: Diagnosis not present

## 2023-11-10 DIAGNOSIS — M545 Low back pain, unspecified: Secondary | ICD-10-CM

## 2023-11-10 DIAGNOSIS — E049 Nontoxic goiter, unspecified: Secondary | ICD-10-CM

## 2023-11-10 DIAGNOSIS — M6281 Muscle weakness (generalized): Secondary | ICD-10-CM

## 2023-11-10 DIAGNOSIS — R262 Difficulty in walking, not elsewhere classified: Secondary | ICD-10-CM

## 2023-11-10 NOTE — Therapy (Signed)
 OUTPATIENT PHYSICAL THERAPY LOWER EXTREMITY EVALUATION   Patient Name: Evelyn Baker MRN: [id number] DOB:[birth year], 63 y.o., female Today's Date: 11/10/2023  END OF SESSION:  PT End of Session - 11/10/23 1245     Visit Number 2    Number of Visits 19    Date for PT Re-Evaluation 01/31/24    Authorization Type [place] M[employer]    PT Start Time 1145    PT Stop Time 1225    PT Time Calculation (min) 40 min    Activity Tolerance Patient tolerated treatment well;Patient limited by pain    Behavior During Therapy WFL for tasks assessed/performed              Past Medical History:  Diagnosis Date   Asthma    Depression    Dyspnea    Hyperlipidemia    Hypertension    Hypothyroidism    Thyroid  disease    Past Surgical History:  Procedure Laterality Date   FOOT SURGERY Left    STRABISMUS SURGERY     age 66   TOTAL ABDOMINAL HYSTERECTOMY     VEIN LIGATION AND STRIPPING Left 01/22/2021   Procedure: LEFT GREATER SAPHENOUS VEIN  LIGATION AND STRIPPING;  Surgeon: Richrd Char, MD;  Location: [facility] OR;  Service: Vascular;  Laterality: Left;   Patient Active Problem List   Diagnosis Date Noted   Mixed connective tissue disease (HCC) 10/09/2023   Primary osteoarthritis of both feet 10/09/2023   Dyspareunia in female 05/05/2022   Abnormal urine 05/05/2022   Chronic constipation 05/05/2022   Fibrocystic disease of breast 05/05/2022   Obesity 05/05/2022   Reactive depression (situational) 05/05/2022   Chronic venous insufficiency 02/13/2020   Leg edema, left 02/13/2020    PCP: Darnelle Elders, PA-C   REFERRING PROVIDER:  Nicholas Bari, MD     REFERRING DIAG:  M70.72 (ICD-10-CM) - Ischial bursitis of left side     THERAPY DIAG:  Pain in left hip  Pain, lumbar region  Muscle weakness (generalized)  Difficulty walking  Rationale for Evaluation and Treatment: Rehabilitation  ONSET DATE: January 2025  SUBJECTIVE:   SUBJECTIVE STATEMENT:  Pt states that  pain is the same. The pain is still all in the back of the leg. Pain hasn't changed much recently. HEP is consistent and does help but pain consistently happens at night. Pt continues to improve as she walks and moves.    Eval: Pt states has difficulty sitting due to pain in her left buttock which radiates into the back of her thigh. She cannot lay flat on her back or sleep- direct pressure causes pain. Pt does not remember specific MOI. Pt cannot reach into her cabinet. Pt does have NT that does go into the toes. Pt states that walking does not bother her hip but her back pain increases. Pt states the pain worsens at night once she relaxes. Pt denies cancer red flags.   Pt states she did not have much relief from left trochanteric bursa injection. Pt reports pain with bending fwd to pick something up. She can perform in a golfer's pick up position but not with L hip flexion position. Denies leg weakness and only pain.   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 She reports no remaining deficits. This occurred 2024  Pt has trip coming up on 26th with lots of sitting/driving.   PERTINENT HISTORY: Bilat bunions, asthma, depression, HTN, OA, mixed connective tissue disease  PAIN:  Are you having pain? Yes: NPRS scale: 8/10;  Pain location: L hip down to back of the thigh Pain description: sharp stabbing Aggravating factors: squatting, standing for long periods, figure 4, stairs  Relieving factors: crossing the leg, heating pad   PRECAUTIONS: None  RED FLAGS: None   WEIGHT BEARING RESTRICTIONS: No  FALLS:  Has patient fallen in last 6 months? Yes. Number of falls 1 on icy  driveway not related to hip  LIVING ENVIRONMENT: Lives with: lives with their spouse Lives in: House/apartment Stairs: yes but has stair lift Has following equipment at home: None  OCCUPATION: retired from [employer]   PLOF: Independent  PATIENT GOALS: return to normal  activity; gardening, walking for exercise, walking in the park    OBJECTIVE:  Note: Objective measures were completed at Evaluation unless otherwise noted.  DIAGNOSTIC FINDINGS:   XR Lumbar  Lumbar lordosis was noted.  No significant disc space narrowing was noted.   L5-S1 narrowing was noted.  No facet joint arthropathy was noted.   Impression: L5-S1 narrowing was noted.   PATIENT SURVEYS:  Modified Oswestry Low Back Pain Disability Questionnaire: 41 / 50 = 82.0 %  LEFS: Lower Extremity Functional Score: 6 / 80 = 7.5 %  COGNITION: Overall cognitive status: Within functional limits for tasks assessed     SENSATION: WFL  POSTURE: rounded shoulders, forward head, increased thoracic kyphosis, and anterior pelvic tilt  PALPATION:  TTP and hypertonicity of L glute lumbar paraspinals and QL; hypertonicity and TTP of L posterior hip/glutes/hip rotators   LUMBAR ROM:   Active  A/PROM  eval  Flexion 75% p! Into L hip and back  Extension 90%  Right lateral flexion 90%  Left lateral flexion 90%  Right rotation 90%  Left rotation 90%   (Blank rows = not tested)   LOWER EXTREMITY ROM: symmetrical and without pain at bilat hips with PROM; AROM limited and very painful on L at the hip with IR/ER and SLR flexion  LOWER EXTREMITY MMT:  MMT Right eval Left eval  Hip flexion 4+/5 4/5 p!  Hip extension    Hip abduction 4+/5 4/5 p!  Hip adduction    Hip internal rotation    Hip external rotation 4+/5 4/5 p!  Knee flexion    Knee extension    Ankle dorsiflexion 5/5 5/5  Ankle plantarflexion    Ankle inversion    Ankle eversion     (Blank rows = not tested)  LOWER EXTREMITY SPECIAL TESTS:  Hip special tests: Portia Brittle (FABER) test: positive , SI compression test: positive , SI distraction test: negative, Hip scouring test: positive , and Piriformis test: positive   Lumbar: slump (positive)  FUNCTIONAL TESTS:  5 times sit to stand: requires UE to perform; not timed given  UE support needed  GAIT: Distance walked: 89ft Assistive device utilized: None Level of assistance: Complete Independence Comments: antalgic on L  TREATMENT DATE:   5/13 Nustep lvl 5 5 min  Sciatic n glide supine review (unable due to pain) Curl up supine 2x10 2s holds (stopped and not given for HEP due to pain)  Supine LTR 2x10 Bridge with band 2x10 Clam with iso holds 2x5 5s  STS with blue band at knees  2x10 Piriformis stretch in seated 30s 2x    5/5  Starting a walking program   Exercises - Hooklying Clamshell with Resistance  - 2 x daily - 7 x weekly - 2 sets - 10 reps - 3-5s hold - Supine Posterior Pelvic Tilt  - 2 x daily - 7 x weekly - 2 sets - 10 reps - 2 hold - Supine Bridge  - 2 x daily - 7 x weekly - 2 sets - 10 reps - Supine Sciatic Nerve Glide  - 2 x daily - 7 x weekly - 1 sets - 10 reps - 2 hold     PATIENT EDUCATION:  Education details: anatomy, acceptable levels of pain, exercise progression, DOMS expectations, muscle firing,  envelope of function, HEP, POC   Person educated: Patient Education method: Explanation, Demonstration, Tactile cues, Verbal cues, and Handouts Education comprehension: verbalized understanding, returned demonstration, verbal cues required, and tactile cues required   HOME EXERCISE PROGRAM:    Access Code: [email or web address] URL: [email or web address] Date: 11/02/2023 Prepared by: Silver Dross  ASSESSMENT:   CLINICAL IMPRESSION:  Pt with abolishment of pain by end of session. Pt with poor response to nerve glide with review of HEP so modified to passive hip stretch only. HEP progressed to higher grade and longer holds of ABD for pain desensitization. Pt responds very well to exercise vs passive therapies. Continue with strength and exercise progression as tolerated. Pt would benefit from continued  skilled therapy in order to reach goals and maximize functional R LE strength and ROM for return to ADL, exercise, and recreation.    OBJECTIVE IMPAIRMENTS: decreased activity tolerance, decreased balance, decreased endurance, decreased mobility, decreased ROM, decreased strength, hypomobility, increased muscle spasms, impaired flexibility, improper body mechanics, postural dysfunction, and pain.    ACTIVITY LIMITATIONS: lifting, squatting, locomotion level, and dressing   PARTICIPATION LIMITATIONS: interpersonal relationship, community activity, occupation, and exercise   PERSONAL FACTORS: Age, fitness, past/current experiences, Time since onset of injury/illness/exacerbation, and 2+ comorbidities are also affecting patient's functional outcome.    REHAB POTENTIAL: Good   CLINICAL DECISION MAKING: unstable/complicated   EVALUATION COMPLEXITY: Moderate     GOALS:     SHORT TERM GOALS: Target date: 12/14/2023        Pt will become independent with HEP in order to demonstrate synthesis of PT education.   Goal status: INITIAL   2.  Pt will have an at least 18 pt improvement in LEFS measure in order to demonstrate MCID improvement in daily function.    Goal status: INITIAL   3.  Pt will report at least 2 pt reduction on NPRS scale for pain in order to demonstrate functional improvement with household activity, self care, and ADL.    Goal status: INITIAL   LONG TERM GOALS: Target date: 01/31/2024      Pt  will become independent with final HEP in order to demonstrate synthesis of PT education.   Goal status: INITIAL   2.  Pt will demonstrate at least a 12.8 improvement in Modified Oswestry Index in order to demonstrate a clinically significant change in LBP and function.    Goal status: INITIAL   3.  Pt will be able to demonstrate/report ability to sit/stand/sleep for extended periods of time without pain in order to demonstrate functional improvement and tolerance to static  positioning.     Goal status: INITIAL   4. Pt will be able to demonstrate/report ability to walk community/grocery store distances without pain in order to demonstrate functional improvement and tolerance to exercise and community mobility.    Goal status: INITIAL  5. Pt will have an at least 27 pt improvement in LEFS measure in order to demonstrate MCID improvement in daily function.    Goal status: INITIAL       PLAN:   PT FREQUENCY: 1-2x/week   PT DURATION: 12 weeks    PLANNED INTERVENTIONS: Therapeutic exercises, Therapeutic activity, Neuromuscular re-education, Balance training, Gait training, Patient/Family education, Self Care, Joint mobilization, Joint manipulation, Stair training, Aquatic Therapy, Dry Needling, Electrical stimulation, Spinal manipulation, Spinal mobilization, Cryotherapy, Moist heat, scar mobilization, Splintting, Taping, Vasopneumatic device, Traction, Ultrasound, Ionotophoresis 4mg /ml Dexamethasone , Manual therapy, and Re-evaluation   PLAN FOR NEXT SESSION: review HEP, active warm up, consider manual though pt very sensitive to touch; longer duration isometric holds at hip,  QL stretching, weighted SB   Silver Dross, PT 11/10/2023, 1:06 PM

## 2023-11-10 NOTE — Telephone Encounter (Signed)
 Dr. Albert Huff: "Hi Evelyn Baker, can you call and confirm the labs on 5/9 reflects her being on Valsartan  320mg  po qday. How much potassium is she taking as well"   14:19 Miles Allan, RN Potassium 10 meq two tablet daily and takes valsartan  daily. Pt was taking at time of 5/9 labs   Pt states that she sent you a message concerning a change in her medication   36 mins Sunit Tolia, DO Yes, she can stop Aldactone  and restart cholthalidone (not sure what she was taking before). I took her off of chlorthalidone  due to hypokalemia. Reports, since we transitioning her back to chlorthalidone  she will need to have a repeat BMP in 1 week. Continue valsartan  320 mg p.o. daily, potassium 10 mEq p.o. twice daily, and verify the dose of chlorthalidone  she was taking and continue that   15:27 Miles Allan, RN  Left message for patient to call back.

## 2023-11-10 NOTE — Telephone Encounter (Signed)
 Left message for patient to give our office a call to scheduled a PFT and follow up with Dr. Bertrum Brodie.

## 2023-11-12 ENCOUNTER — Other Ambulatory Visit: Payer: Self-pay

## 2023-11-12 ENCOUNTER — Telehealth: Payer: Self-pay | Admitting: Podiatrist

## 2023-11-12 ENCOUNTER — Other Ambulatory Visit (HOSPITAL_COMMUNITY): Payer: Self-pay

## 2023-11-12 DIAGNOSIS — E876 Hypokalemia: Secondary | ICD-10-CM

## 2023-11-12 MED ORDER — DOXYCYCLINE HYCLATE 100 MG PO TABS
100.0000 mg | ORAL_TABLET | Freq: Two times a day (BID) | ORAL | 0 refills | Status: DC
  Filled 2023-11-12: qty 14, 7d supply, fill #0

## 2023-11-12 MED ORDER — MUPIROCIN 2 % EX OINT
TOPICAL_OINTMENT | Freq: Two times a day (BID) | CUTANEOUS | 1 refills | Status: DC
Start: 2023-11-12 — End: 2024-04-14
  Filled 2023-11-12: qty 22, 7d supply, fill #0
  Filled 2023-11-17 – 2023-11-18 (×2): qty 22, 7d supply, fill #1

## 2023-11-12 NOTE — Telephone Encounter (Signed)
 MyChart message sent to pt with the below information.

## 2023-11-12 NOTE — Telephone Encounter (Signed)
 I believe earlier this week Evelyn Baker already spoke to patient and Evelyn Baker  was held restarted on Evelyn Baker  w/ repeat BMP.   Evelyn Baker [place], DO, [facility]

## 2023-11-13 ENCOUNTER — Ambulatory Visit: Payer: Self-pay | Admitting: Cardiology

## 2023-11-16 ENCOUNTER — Ambulatory Visit (INDEPENDENT_AMBULATORY_CARE_PROVIDER_SITE_OTHER): Admitting: Podiatrist

## 2023-11-16 DIAGNOSIS — L600 Ingrowing nail: Secondary | ICD-10-CM

## 2023-11-16 NOTE — Patient Instructions (Signed)
Soak Instructions    THE DAY AFTER THE PROCEDURE  Place 1/4 cup of epsom salts in a quart of warm tap water.  Submerge your foot or feet with outer bandage intact for the initial soak; this will allow the bandage to become moist and wet for easy lift off.  Once you remove your bandage, continue to soak in the solution for 20 minutes.  .  Next, remove your foot or feet from solution, blot dry the affected area and cover.  Apply mupirocin ointment (RX), or polysporin   You may use a band aid large enough to cover the area or use gauze and tape.    **This soak should be done twice a day for the next 5-7 days.- after that time you may switch to keeping it clean with soap and water in the shower.  You may discontinue use of the bandaid at night after about 2 weeks.  You may discontinue use of the bandaid during the day in shoes when there is no drainage on the bandaid.   IF YOUR SKIN BECOMES IRRITATED WHILE USING THESE INSTRUCTIONS, IT IS OKAY TO SWITCH TO  antibacterial soap pump soap (Dial)  and water to keep the toe clean instead of soaking in epsom salts.      Long Term Care Instructions-Post Nail Surgery  You have had your ingrown toenail and root treated with a chemical.  This chemical causes a burn that will drain and ooze like a blister.  1-2 weeks after the procedure you may leave the area open to air at night to help dry it up.  During the day,  It is important to keep this area clean and covered until the toe dries out and forms a scab. Once the scab forms you no longer need to soak or apply a dressing.  If at any time you experience an increase in pain, redness, swelling, or drainage, you should contact the office as soon as possible.  

## 2023-11-17 ENCOUNTER — Encounter (HOSPITAL_BASED_OUTPATIENT_CLINIC_OR_DEPARTMENT_OTHER): Payer: Self-pay | Admitting: Physical Therapy

## 2023-11-17 ENCOUNTER — Ambulatory Visit (HOSPITAL_BASED_OUTPATIENT_CLINIC_OR_DEPARTMENT_OTHER): Admitting: Physical Therapy

## 2023-11-17 DIAGNOSIS — M6281 Muscle weakness (generalized): Secondary | ICD-10-CM

## 2023-11-17 DIAGNOSIS — R262 Difficulty in walking, not elsewhere classified: Secondary | ICD-10-CM

## 2023-11-17 DIAGNOSIS — M25552 Pain in left hip: Secondary | ICD-10-CM

## 2023-11-17 DIAGNOSIS — M545 Low back pain, unspecified: Secondary | ICD-10-CM

## 2023-11-17 NOTE — Therapy (Signed)
 OUTPATIENT PHYSICAL THERAPY LOWER EXTREMITY EVALUATION   Patient Name: Evelyn Baker MRN: [id number] DOB:1960-08-18, 63 y.o., female Today's Date: 11/17/2023  END OF SESSION:  PT End of Session - 11/17/23 1233     Visit Number 3    Number of Visits 19    Date for PT Re-Evaluation 01/31/24    Authorization Type [employer] [facility]    PT Start Time 1146    PT Stop Time 1225    PT Time Calculation (min) 39 min    Activity Tolerance Patient tolerated treatment well;Patient limited by pain    Behavior During Therapy WFL for tasks assessed/performed              Past Medical History:  Diagnosis Date   Asthma    Depression    Dyspnea    Hyperlipidemia    Hypertension    Hypothyroidism    Thyroid  disease    Past Surgical History:  Procedure Laterality Date   FOOT SURGERY Left    STRABISMUS SURGERY     age 70   TOTAL ABDOMINAL HYSTERECTOMY     VEIN LIGATION AND STRIPPING Left 01/22/2021   Procedure: LEFT GREATER SAPHENOUS VEIN  LIGATION AND STRIPPING;  Surgeon: Richrd Char, MD;  Location: [facility] OR;  Service: Vascular;  Laterality: Left;   Patient Active Problem List   Diagnosis Date Noted   Mixed connective tissue disease (HCC) 10/09/2023   Primary osteoarthritis of both feet 10/09/2023   Dyspareunia in female 05/05/2022   Abnormal urine 05/05/2022   Chronic constipation 05/05/2022   Fibrocystic disease of breast 05/05/2022   Obesity 05/05/2022   Reactive depression (situational) 05/05/2022   Chronic venous insufficiency 02/13/2020   Leg edema, left 02/13/2020    PCP: Darnelle Elders, PA-C   REFERRING PROVIDER:  Nicholas Bari, MD     REFERRING DIAG:  M70.72 (ICD-10-CM) - Ischial bursitis of left side     THERAPY DIAG:  Pain in left hip  Pain, lumbar region  Muscle weakness (generalized)  Difficulty walking  Rationale for Evaluation and Treatment: Rehabilitation  ONSET DATE: January 2025  SUBJECTIVE:   SUBJECTIVE STATEMENT:  Pt state the  pain is been about the same bc she had a slip and fall out of the tub trying to trim and ingrown toenail. She has been walking on the treadmill recently which has helped the pain. Pt recently had ingrown toenail surgery and is unsure of precautions.    Eval: Pt states has difficulty sitting due to pain in her left buttock which radiates into the back of her thigh. She cannot lay flat on her back or sleep- direct pressure causes pain. Pt does not remember specific MOI. Pt cannot reach into her cabinet. Pt does have NT that does go into the toes. Pt states that walking does not bother her hip but her back pain increases. Pt states the pain worsens at night once she relaxes. Pt denies cancer red flags.   Pt states she did not have much relief from left trochanteric bursa injection. Pt reports pain with bending fwd to pick something up. She can perform in a golfer's pick up position but not with L hip flexion position. Denies leg weakness and only pain.   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 She reports no remaining deficits. This occurred 2024  Pt has trip coming up on 26th with lots of sitting/driving.   PERTINENT HISTORY: Bilat bunions, asthma, depression, HTN, OA,  mixed connective tissue disease  PAIN:  Are you having pain? Yes: NPRS scale: 6/10;  Pain location: L hip down to back of the thigh Pain description: sharp stabbing Aggravating factors: squatting, standing for long periods, figure 4, stairs  Relieving factors: crossing the leg, heating pad   PRECAUTIONS: None  RED FLAGS: None   WEIGHT BEARING RESTRICTIONS: No  FALLS:  Has patient fallen in last 6 months? Yes. Number of falls 1 on icy  driveway not related to hip  LIVING ENVIRONMENT: Lives with: lives with their spouse Lives in: House/apartment Stairs: yes but has stair lift Has following equipment at home: None  OCCUPATION: retired from [employer]   PLOF:  Independent  PATIENT GOALS: return to normal activity; gardening, walking for exercise, walking in the park    OBJECTIVE:  Note: Objective measures were completed at Evaluation unless otherwise noted.  DIAGNOSTIC FINDINGS:   XR Lumbar  Lumbar lordosis was noted.  No significant disc space narrowing was noted.   L5-S1 narrowing was noted.  No facet joint arthropathy was noted.   Impression: L5-S1 narrowing was noted.   PATIENT SURVEYS:  Modified Oswestry Low Back Pain Disability Questionnaire: 41 / 50 = 82.0 %  LEFS: Lower Extremity Functional Score: 6 / 80 = 7.5 %  COGNITION: Overall cognitive status: Within functional limits for tasks assessed     SENSATION: WFL  POSTURE: rounded shoulders, forward head, increased thoracic kyphosis, and anterior pelvic tilt  PALPATION:  TTP and hypertonicity of L glute lumbar paraspinals and QL; hypertonicity and TTP of L posterior hip/glutes/hip rotators   LUMBAR ROM:   Active  A/PROM  eval  Flexion 75% p! Into L hip and back  Extension 90%  Right lateral flexion 90%  Left lateral flexion 90%  Right rotation 90%  Left rotation 90%   (Blank rows = not tested)   LOWER EXTREMITY ROM: symmetrical and without pain at bilat hips with PROM; AROM limited and very painful on L at the hip with IR/ER and SLR flexion  LOWER EXTREMITY MMT:  MMT Right eval Left eval  Hip flexion 4+/5 4/5 p!  Hip extension    Hip abduction 4+/5 4/5 p!  Hip adduction    Hip internal rotation    Hip external rotation 4+/5 4/5 p!  Knee flexion    Knee extension    Ankle dorsiflexion 5/5 5/5  Ankle plantarflexion    Ankle inversion    Ankle eversion     (Blank rows = not tested)  LOWER EXTREMITY SPECIAL TESTS:  Hip special tests: Portia Brittle (FABER) test: positive , SI compression test: positive , SI distraction test: negative, Hip scouring test: positive , and Piriformis test: positive   Lumbar: slump (positive)  FUNCTIONAL TESTS:  5 times sit to  stand: requires UE to perform; not timed given UE support needed  GAIT: Distance walked: 83ft Assistive device utilized: None Level of assistance: Complete Independence Comments: antalgic on L  TREATMENT DATE:   5/20  Nustep lvl 3 6 min  Supine LTR 2x10 Bridge with band 2x10 SKTC 10s 10x on L and R STS from high table focus on hip hinging motion 3x10   STM bilat lumbar paraspinals and QL   5/13 Nustep lvl 5 5 min  Sciatic n glide supine review (unable due to pain) Curl up supine 2x10 2s holds (stopped and not given for HEP due to pain)  Supine LTR 2x10 Bridge with band 2x10 Clam with iso holds 2x5 5s  STS with blue band at knees  2x10 Piriformis stretch in seated 30s 2x    5/5  Starting a walking program   Exercises - Hooklying Clamshell with Resistance  - 2 x daily - 7 x weekly - 2 sets - 10 reps - 3-5s hold - Supine Posterior Pelvic Tilt  - 2 x daily - 7 x weekly - 2 sets - 10 reps - 2 hold - Supine Bridge  - 2 x daily - 7 x weekly - 2 sets - 10 reps - Supine Sciatic Nerve Glide  - 2 x daily - 7 x weekly - 1 sets - 10 reps - 2 hold     PATIENT EDUCATION:  Education details: anatomy, acceptable levels of pain, exercise progression, DOMS expectations, muscle firing,  envelope of function, HEP, POC   Person educated: Patient Education method: Explanation, Demonstration, Tactile cues, Verbal cues, and Handouts Education comprehension: verbalized understanding, returned demonstration, verbal cues required, and tactile cues required   HOME EXERCISE PROGRAM:    Access Code: [email or web address] URL: [email or web address] Date: 11/02/2023 Prepared by: Silver Dross  ASSESSMENT:   CLINICAL IMPRESSION:  Pt returns today following slip and fall out of her bathtub as well as ingrown toenail removal. Given surgical bandage was still in  place and pt with significant increase in mechanical LBP and stiffness, pt was limited with exercise tolerance in standing. Pt was able to improve mobility by end of session and was able to demo a full depth squat to the ground today to simulate picking items from the floor without discomfort. Continue with strength and exercise progression as tolerated when pt returns. Pt would benefit from continued skilled therapy in order to reach goals and maximize functional R LE strength and ROM for return to ADL, exercise, and recreation.    OBJECTIVE IMPAIRMENTS: decreased activity tolerance, decreased balance, decreased endurance, decreased mobility, decreased ROM, decreased strength, hypomobility, increased muscle spasms, impaired flexibility, improper body mechanics, postural dysfunction, and pain.    ACTIVITY LIMITATIONS: lifting, squatting, locomotion level, and dressing   PARTICIPATION LIMITATIONS: interpersonal relationship, community activity, occupation, and exercise   PERSONAL FACTORS: Age, fitness, past/current experiences, Time since onset of injury/illness/exacerbation, and 2+ comorbidities are also affecting patient's functional outcome.    REHAB POTENTIAL: Good   CLINICAL DECISION MAKING: unstable/complicated   EVALUATION COMPLEXITY: Moderate     GOALS:     SHORT TERM GOALS: Target date: 12/14/2023        Pt will become independent with HEP in order to demonstrate synthesis of PT education.   Goal status: INITIAL   2.  Pt will have an at least 18 pt improvement in LEFS measure in order to demonstrate MCID improvement in daily function.    Goal status: INITIAL   3.  Pt will report at least 2 pt reduction on NPRS scale for pain in order to demonstrate functional improvement with household activity, self care, and ADL.    Goal status: INITIAL  LONG TERM GOALS: Target date: 01/31/2024      Pt  will become independent with final HEP in order to demonstrate synthesis of PT  education.   Goal status: INITIAL   2.  Pt will demonstrate at least a 12.8 improvement in Modified Oswestry Index in order to demonstrate a clinically significant change in LBP and function.    Goal status: INITIAL   3.  Pt will be able to demonstrate/report ability to sit/stand/sleep for extended periods of time without pain in order to demonstrate functional improvement and tolerance to static positioning.     Goal status: INITIAL   4. Pt will be able to demonstrate/report ability to walk community/grocery store distances without pain in order to demonstrate functional improvement and tolerance to exercise and community mobility.    Goal status: INITIAL  5. Pt will have an at least 27 pt improvement in LEFS measure in order to demonstrate MCID improvement in daily function.    Goal status: INITIAL       PLAN:   PT FREQUENCY: 1-2x/week   PT DURATION: 12 weeks    PLANNED INTERVENTIONS: Therapeutic exercises, Therapeutic activity, Neuromuscular re-education, Balance training, Gait training, Patient/Family education, Self Care, Joint mobilization, Joint manipulation, Stair training, Aquatic Therapy, Dry Needling, Electrical stimulation, Spinal manipulation, Spinal mobilization, Cryotherapy, Moist heat, scar mobilization, Splintting, Taping, Vasopneumatic device, Traction, Ultrasound, Ionotophoresis 4mg /ml Dexamethasone , Manual therapy, and Re-evaluation   PLAN FOR NEXT SESSION: review HEP, active warm up, consider manual though pt very sensitive to touch; longer duration isometric holds at hip,  QL stretching, weighted SB   Silver Dross, PT 11/17/2023, 12:39 PM

## 2023-11-18 ENCOUNTER — Other Ambulatory Visit: Payer: Self-pay

## 2023-11-18 ENCOUNTER — Other Ambulatory Visit (HOSPITAL_COMMUNITY): Payer: Self-pay

## 2023-11-20 ENCOUNTER — Other Ambulatory Visit (HOSPITAL_COMMUNITY): Payer: Self-pay

## 2023-11-25 NOTE — Progress Notes (Signed)
 Chief Complaint  Patient presents with   Ingrown Toenail    Pt presents for ingrown toenail of left foot.     HPI: Patient is 63 y.o. female who presents today for concerns as listed above.  History confirmed with patient. Pain with direct pressure noted left hallux nail.    Patient Active Problem List   Diagnosis Date Noted   Mixed connective tissue disease (HCC) 10/09/2023   Primary osteoarthritis of both feet 10/09/2023   Dyspareunia in female 05/05/2022   Abnormal urine 05/05/2022   Chronic constipation 05/05/2022   Fibrocystic disease of breast 05/05/2022   Obesity 05/05/2022   Reactive depression (situational) 05/05/2022   Chronic venous insufficiency 02/13/2020   Leg edema, left 02/13/2020    Current Outpatient Medications on File Prior to Visit  Medication Sig Dispense Refill   albuterol  (VENTOLIN  HFA) 108 (90 Base) MCG/ACT inhaler Inhale 2 puffs into the lungs every 4 (four) hours as needed for wheezing or shortness of breath.     azathioprine  (IMURAN ) 100 MG tablet Take 1 tablet (100 mg total) by mouth daily as directed 90 tablet 0   budesonide -formoterol  (SYMBICORT ) 80-4.5 MCG/ACT inhaler Inhale 2 puffs into the lungs in the morning and at bedtime. 10.2 g 11   Cholecalciferol (VITAMIN D-3 PO) Take by mouth.     clopidogrel  (PLAVIX ) 75 MG tablet Take 1 tablet (75 mg total) by mouth daily. 30 tablet 11   Cyanocobalamin (VITAMIN B-12 PO) Take by mouth.     doxycycline  (VIBRA -TABS) 100 MG tablet Take 1 tablet (100 mg total) by mouth 2 (two) times daily with food for 7 days 14 tablet 0   EPINEPHrine  0.3 mg/0.3 mL IJ SOAJ injection Inject 0.3 mg into the muscle as needed for anaphylaxis.     levothyroxine  (SYNTHROID ) 112 MCG tablet Take 1 tablet (112 mcg total) by mouth in the morning on an empty stomach. 30 tablet 5   levothyroxine  (SYNTHROID ) 112 MCG tablet Take 1 tablet (112 mcg total) by mouth in the morning on an empty stomach for 30 days. 30 tablet 5   mupirocin   ointment (BACTROBAN ) 2 % Apply topically 2 (two) times daily for 14 days 22 g 1   potassium chloride  (MICRO-K ) 10 MEQ CR capsule Take 1 capsule (10 mEq total) by mouth 2 (two) times daily.     rosuvastatin  (CRESTOR ) 20 MG tablet Take 1 tablet (20 mg total) by mouth daily. 90 tablet 3   spironolactone  (ALDACTONE ) 25 MG tablet Take 1 tablet (25 mg total) by mouth daily. 30 tablet 3   valsartan  (DIOVAN ) 320 MG tablet Take 1 tablet (320 mg total) by mouth daily. 30 tablet 3   No current facility-administered medications on file prior to visit.    Allergies  Allergen Reactions   Shellfish Allergy Anaphylaxis and Swelling   Avocado Swelling    Lips   Codeine  Other (See Comments)    Constipation   Iodinated Contrast Media Other (See Comments)   Pineapple Swelling    lip   Penicillins Rash    Reaction: Childhood    Review of Systems No fevers, chills, nausea, muscle aches, no difficulty breathing, no calf pain, no chest pain or shortness of breath.   Physical Exam  GENERAL APPEARANCE: Alert, conversant. Appropriately groomed. No acute distress.   VASCULAR: Pedal pulses palpable 2/4 DP and PT bilateral.  Capillary refill time is immediate to all digits,  Proximal to distal cooling is warm to warm.  Digital perfusion adequate.   NEUROLOGIC: sensation  is intact to 5.07 monofilament at 5/5 sites bilateral.  Light touch is intact bilateral, vibratory sensation intact bilateral  MUSCULOSKELETAL: acceptable muscle strength, tone and stability bilateral.  No gross boney pedal deformities noted.  No pain, crepitus or limitation noted with foot and ankle range of motion bilateral.   DERMATOLOGIC: skin is warm, supple, and dry.  Left hallux nail is incurvated and ingrown. Pain with direct pressure present.      Assessment     ICD-10-CM   1. Ingrown left greater toenail  L60.0        Plan  Treatment options and alternatives were discussed. Recommended a permanent removal of the lateral  nail border of the left hallux nail Patient agreed. Skin was prepped with alcohol and a local injection of lidocaine  and Marcaine plain was infiltrated to anesthetize the toe. The toe was then prepped with Betadine and exsanguinated. The offending nail border was removed and phenol applied to the exposed matrix tissue.  The area was then cleansed well with alcohol.  Antibiotic ointment and a dressing was then applied the tourniquet released  noting a prompt hyperemic response to the tip of the toe.  Oral and written instructions were dispensed and the patient was instructed on aftercare.  If there is any increased redness, swelling, drainage, pus or any other concerns arise, Denell will call to be seen.

## 2023-12-01 ENCOUNTER — Ambulatory Visit (HOSPITAL_BASED_OUTPATIENT_CLINIC_OR_DEPARTMENT_OTHER): Attending: Rheumatology

## 2023-12-01 ENCOUNTER — Encounter (HOSPITAL_BASED_OUTPATIENT_CLINIC_OR_DEPARTMENT_OTHER): Payer: Self-pay

## 2023-12-01 DIAGNOSIS — M25552 Pain in left hip: Secondary | ICD-10-CM | POA: Diagnosis present

## 2023-12-01 DIAGNOSIS — M6281 Muscle weakness (generalized): Secondary | ICD-10-CM | POA: Diagnosis present

## 2023-12-01 DIAGNOSIS — M545 Low back pain, unspecified: Secondary | ICD-10-CM | POA: Diagnosis present

## 2023-12-01 DIAGNOSIS — R262 Difficulty in walking, not elsewhere classified: Secondary | ICD-10-CM | POA: Diagnosis present

## 2023-12-01 NOTE — Therapy (Signed)
 OUTPATIENT PHYSICAL THERAPY LOWER EXTREMITY TREATMENT   Patient Name: Evelyn Baker MRN: [id number] DOB:1960-12-31, 63 y.o., female Today's Date: 12/01/2023  END OF SESSION:  PT End of Session - 12/01/23 1223     Visit Number 4    Number of Visits 19    Date for PT Re-Evaluation 01/31/24    Authorization Type [place] [facility]    PT Start Time 1153    PT Stop Time 1231    PT Time Calculation (min) 38 min    Activity Tolerance Patient tolerated treatment well    Behavior During Therapy WFL for tasks assessed/performed               Past Medical History:  Diagnosis Date   Asthma    Depression    Dyspnea    Hyperlipidemia    Hypertension    Hypothyroidism    Thyroid  disease    Past Surgical History:  Procedure Laterality Date   FOOT SURGERY Left    STRABISMUS SURGERY     age 52   TOTAL ABDOMINAL HYSTERECTOMY     VEIN LIGATION AND STRIPPING Left 01/22/2021   Procedure: LEFT GREATER SAPHENOUS VEIN  LIGATION AND STRIPPING;  Surgeon: Richrd Char, MD;  Location: [facility] OR;  Service: Vascular;  Laterality: Left;   Patient Active Problem List   Diagnosis Date Noted   Mixed connective tissue disease (HCC) 10/09/2023   Primary osteoarthritis of both feet 10/09/2023   Dyspareunia in female 05/05/2022   Abnormal urine 05/05/2022   Chronic constipation 05/05/2022   Fibrocystic disease of breast 05/05/2022   Obesity 05/05/2022   Reactive depression (situational) 05/05/2022   Chronic venous insufficiency 02/13/2020   Leg edema, left 02/13/2020    PCP: Darnelle Elders, PA-C   REFERRING PROVIDER:  Nicholas Bari, MD     REFERRING DIAG:  M70.72 (ICD-10-CM) - Ischial bursitis of left side     THERAPY DIAG:  Pain in left hip  Pain, lumbar region  Muscle weakness (generalized)  Difficulty walking  Rationale for Evaluation and Treatment: Rehabilitation  ONSET DATE: January 2025  SUBJECTIVE:   SUBJECTIVE STATEMENT:  Pt reports no pain at entry. Continues  to have nerve pain at night.   Eval: Pt states has difficulty sitting due to pain in her left buttock which radiates into the back of her thigh. She cannot lay flat on her back or sleep- direct pressure causes pain. Pt does not remember specific MOI. Pt cannot reach into her cabinet. Pt does have NT that does go into the toes. Pt states that walking does not bother her hip but her back pain increases. Pt states the pain worsens at night once she relaxes. Pt denies cancer red flags.   Pt states she did not have much relief from left trochanteric bursa injection. Pt reports pain with bending fwd to pick something up. She can perform in a golfer's pick up position but not with L hip flexion position. Denies leg weakness and only pain.   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 She reports no remaining deficits. This occurred 2024  Pt has trip coming up on 26th with lots of sitting/driving.   PERTINENT HISTORY: Bilat bunions, asthma, depression, HTN, OA, mixed connective tissue disease  PAIN:  Are you having pain? Yes: NPRS scale: 6/10;  Pain location: L hip down to back of the thigh Pain description: sharp stabbing Aggravating factors: squatting, standing for long periods, figure 4, stairs  Relieving factors: crossing the leg, heating pad   PRECAUTIONS: None  RED FLAGS: None   WEIGHT BEARING RESTRICTIONS: No  FALLS:  Has patient fallen in last 6 months? Yes. Number of falls 1 on icy  driveway not related to hip  LIVING ENVIRONMENT: Lives with: lives with their spouse Lives in: House/apartment Stairs: yes but has stair lift Has following equipment at home: None  OCCUPATION: retired from [employer]   PLOF: Independent  PATIENT GOALS: return to normal activity; gardening, walking for exercise, walking in the park    OBJECTIVE:  Note: Objective measures were completed at Evaluation unless otherwise noted.  DIAGNOSTIC FINDINGS:   XR  Lumbar  Lumbar lordosis was noted.  No significant disc space narrowing was noted.   L5-S1 narrowing was noted.  No facet joint arthropathy was noted.   Impression: L5-S1 narrowing was noted.   PATIENT SURVEYS:  Modified Oswestry Low Back Pain Disability Questionnaire: 41 / 50 = 82.0 %  LEFS: Lower Extremity Functional Score: 6 / 80 = 7.5 %  COGNITION: Overall cognitive status: Within functional limits for tasks assessed     SENSATION: WFL  POSTURE: rounded shoulders, forward head, increased thoracic kyphosis, and anterior pelvic tilt  PALPATION:  TTP and hypertonicity of L glute lumbar paraspinals and QL; hypertonicity and TTP of L posterior hip/glutes/hip rotators   LUMBAR ROM:   Active  A/PROM  eval  Flexion 75% p! Into L hip and back  Extension 90%  Right lateral flexion 90%  Left lateral flexion 90%  Right rotation 90%  Left rotation 90%   (Blank rows = not tested)   LOWER EXTREMITY ROM: symmetrical and without pain at bilat hips with PROM; AROM limited and very painful on L at the hip with IR/ER and SLR flexion  LOWER EXTREMITY MMT:  MMT Right eval Left eval  Hip flexion 4+/5 4/5 p!  Hip extension    Hip abduction 4+/5 4/5 p!  Hip adduction    Hip internal rotation    Hip external rotation 4+/5 4/5 p!  Knee flexion    Knee extension    Ankle dorsiflexion 5/5 5/5  Ankle plantarflexion    Ankle inversion    Ankle eversion     (Blank rows = not tested)  LOWER EXTREMITY SPECIAL TESTS:  Hip special tests: Portia Brittle (FABER) test: positive , SI compression test: positive , SI distraction test: negative, Hip scouring test: positive , and Piriformis test: positive   Lumbar: slump (positive)  FUNCTIONAL TESTS:  5 times sit to stand: requires UE to perform; not timed given UE support needed  GAIT: Distance walked: 13ft Assistive device utilized: None Level of assistance: Complete Independence Comments: antalgic on L                                                                                                                                TREATMENT DATE:   6/3 Nustep lvl 4 5 min  STM to L gluteal mm in sidelying position Supine LTR 2x10 SKTC 2x30seconds Bridge 2x10 S/l clam with iso hold 5" 2x10 STS with blue band at knees  2x10  5/20  Nustep lvl 3 6 min  Supine LTR 2x10 Bridge with band 2x10 SKTC 10s 10x on L and R STS from high table focus on hip hinging motion 3x10   STM bilat lumbar paraspinals and QL   5/13 Nustep lvl 5 5 min  Sciatic n glide supine review (unable due to pain) Curl up supine 2x10 2s holds (stopped and not given for HEP due to pain)  Supine LTR 2x10 Bridge with band 2x10 Clam with iso holds 2x5 5s  STS with blue band at knees  2x10 Piriformis stretch in seated 30s 2x    5/5  Starting a walking program   Exercises - Hooklying Clamshell with Resistance  - 2 x daily - 7 x weekly - 2 sets - 10 reps - 3-5s hold - Supine Posterior Pelvic Tilt  - 2 x daily - 7 x weekly - 2 sets - 10 reps - 2 hold - Supine Bridge  - 2 x daily - 7 x weekly - 2 sets - 10 reps - Supine Sciatic Nerve Glide  - 2 x daily - 7 x weekly - 1 sets - 10 reps - 2 hold     PATIENT EDUCATION:  Education details: anatomy, acceptable levels of pain, exercise progression, DOMS expectations, muscle firing,  envelope of function, HEP, POC   Person educated: Patient Education method: Explanation, Demonstration, Tactile cues, Verbal cues, and Handouts Education comprehension: verbalized understanding, returned demonstration, verbal cues required, and tactile cues required   HOME EXERCISE PROGRAM:    Access Code: [email or web address] URL: [email or web address] Date: 11/02/2023 Prepared by: Silver Dross  ASSESSMENT:   CLINICAL IMPRESSION:  Pt with improved tolerance for exercise today. Performed bridges which she did feel mild lumbar discomfort with. Good tolerance for sit to stands without c/o pain. Worked on STM to L  gluteal mm in sidelying to decrease restrictions here.    OBJECTIVE IMPAIRMENTS: decreased activity tolerance, decreased balance, decreased endurance, decreased mobility, decreased ROM, decreased strength, hypomobility, increased muscle spasms, impaired flexibility, improper body mechanics, postural dysfunction, and pain.    ACTIVITY LIMITATIONS: lifting, squatting, locomotion level, and dressing   PARTICIPATION LIMITATIONS: interpersonal relationship, community activity, occupation, and exercise   PERSONAL FACTORS: Age, fitness, past/current experiences, Time since onset of injury/illness/exacerbation, and 2+ comorbidities are also affecting patient's functional outcome.    REHAB POTENTIAL: Good   CLINICAL DECISION MAKING: unstable/complicated   EVALUATION COMPLEXITY: Moderate     GOALS:     SHORT TERM GOALS: Target date: 12/14/2023        Pt will become independent with HEP in order to demonstrate synthesis of PT education.   Goal status: INITIAL   2.  Pt will have an at least 18 pt improvement in LEFS measure in order to demonstrate MCID improvement in daily function.    Goal status: INITIAL   3.  Pt will report at least 2 pt reduction on NPRS scale for pain in order to demonstrate functional improvement with household activity, self care, and ADL.    Goal status: INITIAL   LONG TERM GOALS: Target date: 01/31/2024      Pt  will become independent with final HEP in order to demonstrate synthesis of PT education.   Goal status: INITIAL   2.  Pt will demonstrate at least a 12.8 improvement in Modified  Oswestry Index in order to demonstrate a clinically significant change in LBP and function.    Goal status: INITIAL   3.  Pt will be able to demonstrate/report ability to sit/stand/sleep for extended periods of time without pain in order to demonstrate functional improvement and tolerance to static positioning.     Goal status: INITIAL   4. Pt will be able to  demonstrate/report ability to walk community/grocery store distances without pain in order to demonstrate functional improvement and tolerance to exercise and community mobility.    Goal status: INITIAL  5. Pt will have an at least 27 pt improvement in LEFS measure in order to demonstrate MCID improvement in daily function.    Goal status: INITIAL       PLAN:   PT FREQUENCY: 1-2x/week   PT DURATION: 12 weeks    PLANNED INTERVENTIONS: Therapeutic exercises, Therapeutic activity, Neuromuscular re-education, Balance training, Gait training, Patient/Family education, Self Care, Joint mobilization, Joint manipulation, Stair training, Aquatic Therapy, Dry Needling, Electrical stimulation, Spinal manipulation, Spinal mobilization, Cryotherapy, Moist heat, scar mobilization, Splintting, Taping, Vasopneumatic device, Traction, Ultrasound, Ionotophoresis 4mg /ml Dexamethasone , Manual therapy, and Re-evaluation   PLAN FOR NEXT SESSION: review HEP, active warm up, consider manual though pt very sensitive to touch; longer duration isometric holds at hip,  QL stretching, weighted SB   Lyndsey Demos E Aithan Farrelly, PTA 12/01/2023, 1:20 PM

## 2023-12-03 ENCOUNTER — Encounter (HOSPITAL_BASED_OUTPATIENT_CLINIC_OR_DEPARTMENT_OTHER): Payer: Self-pay | Admitting: Physical Therapy

## 2023-12-03 ENCOUNTER — Ambulatory Visit (HOSPITAL_BASED_OUTPATIENT_CLINIC_OR_DEPARTMENT_OTHER): Admitting: Physical Therapy

## 2023-12-03 DIAGNOSIS — M545 Low back pain, unspecified: Secondary | ICD-10-CM

## 2023-12-03 DIAGNOSIS — M6281 Muscle weakness (generalized): Secondary | ICD-10-CM

## 2023-12-03 DIAGNOSIS — R262 Difficulty in walking, not elsewhere classified: Secondary | ICD-10-CM

## 2023-12-03 DIAGNOSIS — M25552 Pain in left hip: Secondary | ICD-10-CM

## 2023-12-03 NOTE — Therapy (Signed)
 OUTPATIENT PHYSICAL THERAPY LOWER EXTREMITY TREATMENT   Patient Name: Evelyn Baker MRN: [id number] DOB:1960/10/18, 63 y.o., female Today's Date: 12/03/2023  END OF SESSION:  PT End of Session - 12/03/23 1113     Visit Number 5    Number of Visits 19    Date for PT Re-Evaluation 01/31/24    Authorization Type [place] [facility]    PT Start Time 1102    PT Stop Time 1142    PT Time Calculation (min) 40 min    Activity Tolerance Patient tolerated treatment well    Behavior During Therapy WFL for tasks assessed/performed                Past Medical History:  Diagnosis Date   Asthma    Depression    Dyspnea    Hyperlipidemia    Hypertension    Hypothyroidism    Thyroid  disease    Past Surgical History:  Procedure Laterality Date   FOOT SURGERY Left    STRABISMUS SURGERY     age 37   TOTAL ABDOMINAL HYSTERECTOMY     VEIN LIGATION AND STRIPPING Left 01/22/2021   Procedure: LEFT GREATER SAPHENOUS VEIN  LIGATION AND STRIPPING;  Surgeon: Richrd Char, MD;  [facility]: [facility];  Service: Vascular;  Laterality: Left;   Patient Active Problem List   Diagnosis Date Noted   Mixed connective tissue disease (HCC) 10/09/2023   Primary osteoarthritis of both feet 10/09/2023   Dyspareunia in female 05/05/2022   Abnormal urine 05/05/2022   Chronic constipation 05/05/2022   Fibrocystic disease of breast 05/05/2022   Obesity 05/05/2022   Reactive depression (situational) 05/05/2022   Chronic venous insufficiency 02/13/2020   Leg edema, left 02/13/2020    PCP: Darnelle Elders, PA-C   REFERRING PROVIDER:  Nicholas Bari, MD     REFERRING DIAG:  M70.72 (ICD-10-CM) - Ischial bursitis of left side     THERAPY DIAG:  Pain in left hip  Pain, lumbar region  Muscle weakness (generalized)  Difficulty walking  Rationale for Evaluation and Treatment: Rehabilitation  ONSET DATE: January 2025  SUBJECTIVE:   SUBJECTIVE STATEMENT:  Pt reports pain is improving. She is  not feeling it as much as before. Still pain at night.    Eval: Pt states has difficulty sitting due to pain in her left buttock which radiates into the back of her thigh. She cannot lay flat on her back or sleep- direct pressure causes pain. Pt does not remember specific MOI. Pt cannot reach into her cabinet. Pt does have NT that does go into the toes. Pt states that walking does not bother her hip but her back pain increases. Pt states the pain worsens at night once she relaxes. Pt denies cancer red flags.   Pt states she did not have much relief from left trochanteric bursa injection. Pt reports pain with bending fwd to pick something up. She can perform in a golfer's pick up position but not with L hip flexion position. Denies leg weakness and only pain.   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 She reports no remaining deficits. This occurred 2024  Pt has trip coming up on 26th with lots of sitting/driving.   PERTINENT HISTORY: Bilat bunions, asthma, depression, HTN, OA, mixed connective tissue disease  PAIN:  Are you having pain? Yes: NPRS scale: 6/10;  Pain location: L hip down to back of the thigh Pain description: sharp stabbing Aggravating factors: squatting,  standing for long periods, figure 4, stairs  Relieving factors: crossing the leg, heating pad   PRECAUTIONS: None  RED FLAGS: None   WEIGHT BEARING RESTRICTIONS: No  FALLS:  Has patient fallen in last 6 months? Yes. Number of falls 1 on icy  driveway not related to hip  LIVING ENVIRONMENT: Lives with: lives with their spouse Lives in: House/apartment Stairs: yes but has stair lift Has following equipment at home: None  OCCUPATION: retired from [employer]   PLOF: Independent  PATIENT GOALS: return to normal activity; gardening, walking for exercise, walking in the park    OBJECTIVE:  Note: Objective measures were completed at Evaluation unless otherwise  noted.  DIAGNOSTIC FINDINGS:   XR Lumbar  Lumbar lordosis was noted.  No significant disc space narrowing was noted.   L5-S1 narrowing was noted.  No facet joint arthropathy was noted.   Impression: L5-S1 narrowing was noted.   PATIENT SURVEYS:  Modified Oswestry Low Back Pain Disability Questionnaire: 41 / 50 = 82.0 %  LEFS: Lower Extremity Functional Score: 6 / 80 = 7.5 %  COGNITION: Overall cognitive status: Within functional limits for tasks assessed     SENSATION: WFL  POSTURE: rounded shoulders, forward head, increased thoracic kyphosis, and anterior pelvic tilt  PALPATION:  TTP and hypertonicity of L glute lumbar paraspinals and QL; hypertonicity and TTP of L posterior hip/glutes/hip rotators   LUMBAR ROM:   Active  A/PROM  eval  Flexion 75% p! Into L hip and back  Extension 90%  Right lateral flexion 90%  Left lateral flexion 90%  Right rotation 90%  Left rotation 90%   (Blank rows = not tested)   LOWER EXTREMITY ROM: symmetrical and without pain at bilat hips with PROM; AROM limited and very painful on L at the hip with IR/ER and SLR flexion  LOWER EXTREMITY MMT:  MMT Right eval Left eval  Hip flexion 4+/5 4/5 p!  Hip extension    Hip abduction 4+/5 4/5 p!  Hip adduction    Hip internal rotation    Hip external rotation 4+/5 4/5 p!  Knee flexion    Knee extension    Ankle dorsiflexion 5/5 5/5  Ankle plantarflexion    Ankle inversion    Ankle eversion     (Blank rows = not tested)  LOWER EXTREMITY SPECIAL TESTS:  Hip special tests: Portia Brittle (FABER) test: positive , SI compression test: positive , SI distraction test: negative, Hip scouring test: positive , and Piriformis test: positive   Lumbar: slump (positive)  FUNCTIONAL TESTS:  5 times sit to stand: requires UE to perform; not timed given UE support needed  GAIT: Distance walked: 29ft Assistive device utilized: None Level of assistance: Complete Independence Comments: antalgic on L                                                                                                                                TREATMENT DATE:  6/5  Recumbent bike lvl 4; 2x2 min rounds  4" lateral step down 2x10 ea Lumbar L stretch to extension 2x10 with bar support Seated QL stretch with fig 4 30s 2x each Sidestepping YTB at knees 67ft 2x Lumbar flexion to lumbar extension seated 2x10 Exercise modifications for pain at home, self progression of walking and SL exercise  6/3 Nustep lvl 4 5 min  STM to L gluteal mm in sidelying position Supine LTR 2x10 SKTC 2x30seconds Bridge 2x10 S/l clam with iso hold 5" 2x10 STS with blue band at knees  2x10  5/20  Nustep lvl 3 6 min  Supine LTR 2x10 Bridge with band 2x10 SKTC 10s 10x on L and R STS from high table focus on hip hinging motion 3x10   STM bilat lumbar paraspinals and QL   5/13 Nustep lvl 5 5 min  Sciatic n glide supine review (unable due to pain) Curl up supine 2x10 2s holds (stopped and not given for HEP due to pain)  Supine LTR 2x10 Bridge with band 2x10 Clam with iso holds 2x5 5s  STS with blue band at knees  2x10 Piriformis stretch in seated 30s 2x    5/5  Starting a walking program   Exercises - Hooklying Clamshell with Resistance  - 2 x daily - 7 x weekly - 2 sets - 10 reps - 3-5s hold - Supine Posterior Pelvic Tilt  - 2 x daily - 7 x weekly - 2 sets - 10 reps - 2 hold - Supine Bridge  - 2 x daily - 7 x weekly - 2 sets - 10 reps - Supine Sciatic Nerve Glide  - 2 x daily - 7 x weekly - 1 sets - 10 reps - 2 hold     PATIENT EDUCATION:  Education details: anatomy, acceptable levels of pain, exercise progression, DOMS expectations, muscle firing,  envelope of function, HEP, POC   Person educated: Patient Education method: Explanation, Demonstration, Tactile cues, Verbal cues, and Handouts Education comprehension: verbalized understanding, returned demonstration, verbal cues required, and tactile  cues required   HOME EXERCISE PROGRAM:    Access Code: [email or web address] URL: [email or web address] Date: 11/02/2023 Prepared by: Silver Dross  ASSESSMENT:   CLINICAL IMPRESSION:  Pt lumbar and hip mobility progressed today as well as CKC movements progressed. Pt does appear to have component of lumbar flexion intolerance that may be contributing to pain. HEP updated today to progress hip strength/desensitization as well as progressive lumbar mobility. Pt to change frequency of her visits due to insurance limitations. Plan to have pt work with current HEP and then progress to loaded lumbopelvic lifting/carrying/holding at next session dependent on sensitivity and improvement in movement tolerance.    OBJECTIVE IMPAIRMENTS: decreased activity tolerance, decreased balance, decreased endurance, decreased mobility, decreased ROM, decreased strength, hypomobility, increased muscle spasms, impaired flexibility, improper body mechanics, postural dysfunction, and pain.    ACTIVITY LIMITATIONS: lifting, squatting, locomotion level, and dressing   PARTICIPATION LIMITATIONS: interpersonal relationship, community activity, occupation, and exercise   PERSONAL FACTORS: Age, fitness, past/current experiences, Time since onset of injury/illness/exacerbation, and 2+ comorbidities are also affecting patient's functional outcome.    REHAB POTENTIAL: Good   CLINICAL DECISION MAKING: unstable/complicated   EVALUATION COMPLEXITY: Moderate     GOALS:     SHORT TERM GOALS: Target date: 12/14/2023        Pt will become independent with HEP in order to demonstrate synthesis of PT education.   Goal status: INITIAL   2.  Pt will have  an at least 18 pt improvement in LEFS measure in order to demonstrate MCID improvement in daily function.    Goal status: INITIAL   3.  Pt will report at least 2 pt reduction on NPRS scale for pain in order to demonstrate functional improvement with household  activity, self care, and ADL.    Goal status: INITIAL   LONG TERM GOALS: Target date: 01/31/2024      Pt  will become independent with final HEP in order to demonstrate synthesis of PT education.   Goal status: INITIAL   2.  Pt will demonstrate at least a 12.8 improvement in Modified Oswestry Index in order to demonstrate a clinically significant change in LBP and function.    Goal status: INITIAL   3.  Pt will be able to demonstrate/report ability to sit/stand/sleep for extended periods of time without pain in order to demonstrate functional improvement and tolerance to static positioning.     Goal status: INITIAL   4. Pt will be able to demonstrate/report ability to walk community/grocery store distances without pain in order to demonstrate functional improvement and tolerance to exercise and community mobility.    Goal status: INITIAL  5. Pt will have an at least 27 pt improvement in LEFS measure in order to demonstrate MCID improvement in daily function.    Goal status: INITIAL       PLAN:   PT FREQUENCY: 1-2x/week   PT DURATION: 12 weeks    PLANNED INTERVENTIONS: Therapeutic exercises, Therapeutic activity, Neuromuscular re-education, Balance training, Gait training, Patient/Family education, Self Care, Joint mobilization, Joint manipulation, Stair training, Aquatic Therapy, Dry Needling, Electrical stimulation, Spinal manipulation, Spinal mobilization, Cryotherapy, Moist heat, scar mobilization, Splintting, Taping, Vasopneumatic device, Traction, Ultrasound, Ionotophoresis 4mg /ml Dexamethasone , Manual therapy, and Re-evaluation   PLAN FOR NEXT SESSION: review HEP, active warm up, consider manual though pt very sensitive to touch; longer duration isometric holds at hip,  QL stretching, weighted SB   Silver Dross, PT 12/03/2023, 11:51 AM

## 2023-12-04 ENCOUNTER — Ambulatory Visit
Admission: RE | Admit: 2023-12-04 | Discharge: 2023-12-04 | Disposition: A | Discharge status: Home / Self Care | Source: Ambulatory Visit | Attending: Endocrinology | Admitting: Endocrinology

## 2023-12-04 DIAGNOSIS — E049 Nontoxic goiter, unspecified: Secondary | ICD-10-CM

## 2023-12-10 ENCOUNTER — Encounter (HOSPITAL_BASED_OUTPATIENT_CLINIC_OR_DEPARTMENT_OTHER): Admitting: Physical Therapy

## 2023-12-14 ENCOUNTER — Other Ambulatory Visit (HOSPITAL_COMMUNITY): Payer: Self-pay

## 2023-12-14 MED ORDER — GABAPENTIN 100 MG PO CAPS
100.0000 mg | ORAL_CAPSULE | Freq: Every evening | ORAL | 1 refills | Status: DC
  Filled 2023-12-14: qty 30, 30d supply, fill #0

## 2023-12-15 ENCOUNTER — Other Ambulatory Visit: Payer: Self-pay | Admitting: Family Medicine

## 2023-12-15 DIAGNOSIS — M5410 Radiculopathy, site unspecified: Secondary | ICD-10-CM

## 2023-12-16 ENCOUNTER — Other Ambulatory Visit (HOSPITAL_COMMUNITY): Payer: Self-pay

## 2023-12-18 ENCOUNTER — Other Ambulatory Visit: Payer: Self-pay | Admitting: Rheumatology

## 2023-12-18 ENCOUNTER — Ambulatory Visit
Admission: RE | Admit: 2023-12-18 | Discharge: 2023-12-18 | Disposition: A | Discharge status: Home / Self Care | Payer: Medicaid Other | Source: Ambulatory Visit | Attending: Internal Medicine | Admitting: Internal Medicine

## 2023-12-18 DIAGNOSIS — M3500 Sicca syndrome, unspecified: Secondary | ICD-10-CM

## 2023-12-18 DIAGNOSIS — R9389 Abnormal findings on diagnostic imaging of other specified body structures: Secondary | ICD-10-CM

## 2023-12-18 DIAGNOSIS — R0609 Other forms of dyspnea: Secondary | ICD-10-CM

## 2023-12-18 DIAGNOSIS — Z8709 Personal history of other diseases of the respiratory system: Secondary | ICD-10-CM

## 2023-12-19 ENCOUNTER — Other Ambulatory Visit: Payer: Self-pay

## 2023-12-19 ENCOUNTER — Other Ambulatory Visit (HOSPITAL_COMMUNITY): Payer: Self-pay

## 2023-12-19 ENCOUNTER — Other Ambulatory Visit (HOSPITAL_BASED_OUTPATIENT_CLINIC_OR_DEPARTMENT_OTHER): Payer: Self-pay

## 2023-12-21 ENCOUNTER — Other Ambulatory Visit (HOSPITAL_COMMUNITY): Payer: Self-pay

## 2023-12-21 ENCOUNTER — Ambulatory Visit (HOSPITAL_BASED_OUTPATIENT_CLINIC_OR_DEPARTMENT_OTHER)

## 2023-12-21 DIAGNOSIS — M3500 Sicca syndrome, unspecified: Secondary | ICD-10-CM

## 2023-12-21 DIAGNOSIS — R0609 Other forms of dyspnea: Secondary | ICD-10-CM

## 2023-12-21 DIAGNOSIS — Z8709 Personal history of other diseases of the respiratory system: Secondary | ICD-10-CM

## 2023-12-21 DIAGNOSIS — R9389 Abnormal findings on diagnostic imaging of other specified body structures: Secondary | ICD-10-CM

## 2023-12-21 LAB — PULMONARY FUNCTION TEST
DL/VA % pred: 124 %
DL/VA: 5.2 ml/min/mmHg/L
DLCO cor % pred: 79 %
DLCO cor: 16.62 ml/min/mmHg
DLCO unc % pred: 79 %
DLCO unc: 16.63 ml/min/mmHg
FEF 25-75 Post: 1.41 L/s
FEF 25-75 Pre: 1.3 L/s
FEF2575-%Change-Post: 8 %
FEF2575-%Pred-Post: 58 %
FEF2575-%Pred-Pre: 54 %
FEV1-%Change-Post: 1 %
FEV1-%Pred-Post: 62 %
FEV1-%Pred-Pre: 61 %
FEV1-Post: 1.66 L
FEV1-Pre: 1.63 L
FEV1FVC-%Change-Post: 3 %
FEV1FVC-%Pred-Pre: 98 %
FEV6-%Change-Post: -1 %
FEV6-%Pred-Post: 62 %
FEV6-%Pred-Pre: 63 %
FEV6-Post: 2.08 L
FEV6-Pre: 2.12 L
FEV6FVC-%Pred-Post: 103 %
FEV6FVC-%Pred-Pre: 103 %
FVC-%Change-Post: -2 %
FVC-%Pred-Post: 60 %
FVC-%Pred-Pre: 62 %
FVC-Post: 2.08 L
FVC-Pre: 2.13 L
Post FEV1/FVC ratio: 80 %
Post FEV6/FVC ratio: 100 %
Pre FEV1/FVC ratio: 77 %
Pre FEV6/FVC Ratio: 100 %
RV % pred: 107 %
RV: 2.19 L
TLC % pred: 85 %
TLC: 4.43 L

## 2023-12-21 MED ORDER — AZATHIOPRINE 100 MG PO TABS
100.0000 mg | ORAL_TABLET | Freq: Every day | ORAL | 0 refills | Status: DC
  Filled 2023-12-21: qty 90, 90d supply, fill #0

## 2023-12-21 MED ORDER — PREDNISONE 20 MG PO TABS
ORAL_TABLET | ORAL | 0 refills | Status: AC
  Filled 2023-12-21: qty 15, 10d supply, fill #0

## 2023-12-21 NOTE — Progress Notes (Signed)
 Full PFT performed today.

## 2023-12-21 NOTE — Patient Instructions (Signed)
 Full PFT performed today.

## 2023-12-21 NOTE — Telephone Encounter (Signed)
 Last Fill: 09/21/2023  Labs: 09/30/2023 CBC normal, glucose mildly elevated probably not a fasting sample.  Creatinine is elevated.   Next Visit: 03/18/2024  Last Visit: 10/09/2023  DX:  Mixed connective tissue disease   Current Dose per office note 10/09/2023: Imuran  100 mg p.o. daily   Okay to refill Imuran ?

## 2023-12-22 ENCOUNTER — Other Ambulatory Visit (HOSPITAL_COMMUNITY): Payer: Self-pay

## 2023-12-24 ENCOUNTER — Encounter (HOSPITAL_BASED_OUTPATIENT_CLINIC_OR_DEPARTMENT_OTHER): Payer: Self-pay

## 2023-12-24 ENCOUNTER — Other Ambulatory Visit (HOSPITAL_COMMUNITY): Payer: Self-pay

## 2023-12-24 ENCOUNTER — Ambulatory Visit (HOSPITAL_BASED_OUTPATIENT_CLINIC_OR_DEPARTMENT_OTHER)

## 2023-12-24 DIAGNOSIS — M545 Low back pain, unspecified: Secondary | ICD-10-CM

## 2023-12-24 DIAGNOSIS — R262 Difficulty in walking, not elsewhere classified: Secondary | ICD-10-CM

## 2023-12-24 DIAGNOSIS — M25552 Pain in left hip: Secondary | ICD-10-CM | POA: Diagnosis not present

## 2023-12-24 DIAGNOSIS — M6281 Muscle weakness (generalized): Secondary | ICD-10-CM

## 2023-12-24 NOTE — Therapy (Signed)
 OUTPATIENT PHYSICAL THERAPY LOWER EXTREMITY TREATMENT   Patient Name: Evelyn Baker MRN: [id number] DOB:1961/01/14, 63 y.o., female Today's Date: 12/24/2023  END OF SESSION:  PT End of Session - 12/24/23 1012     Visit Number 6    Number of Visits 19    Date for PT Re-Evaluation 01/31/24    Authorization Type [place] [facility]    PT Start Time 1017    PT Stop Time 1100    PT Time Calculation (min) 43 min    Activity Tolerance Patient tolerated treatment well    Behavior During Therapy WFL for tasks assessed/performed              Past Medical History:  Diagnosis Date   Asthma    Depression    Dyspnea    Hyperlipidemia    Hypertension    Hypothyroidism    Thyroid  disease    Past Surgical History:  Procedure Laterality Date   FOOT SURGERY Left    STRABISMUS SURGERY     age 89   TOTAL ABDOMINAL HYSTERECTOMY     VEIN LIGATION AND STRIPPING Left 01/22/2021   Procedure: LEFT GREATER SAPHENOUS VEIN  LIGATION AND STRIPPING;  Surgeon: Harvey Carlin BRAVO, MD;  Location: [facility] OR;  Service: Vascular;  Laterality: Left;   Patient Active Problem List   Diagnosis Date Noted   Mixed connective tissue disease (HCC) 10/09/2023   Primary osteoarthritis of both feet 10/09/2023   Dyspareunia in female 05/05/2022   Abnormal urine 05/05/2022   Chronic constipation 05/05/2022   Fibrocystic disease of breast 05/05/2022   Obesity 05/05/2022   Reactive depression (situational) 05/05/2022   Chronic venous insufficiency 02/13/2020   Leg edema, left 02/13/2020    PCP: Katina Pfeiffer, PA-C   REFERRING PROVIDER:  Dolphus Reiter, MD     REFERRING DIAG:  M70.72 (ICD-10-CM) - Ischial bursitis of left side     THERAPY DIAG:  Pain in left hip  Pain, lumbar region  Difficulty walking  Muscle weakness (generalized)  Rationale for Evaluation and Treatment: Rehabilitation  ONSET DATE: January 2025  SUBJECTIVE:   SUBJECTIVE STATEMENT:  Having MRI on Sunday for lumbar spine.  She reports pain is there some days and some days it's better. Having pain today on L side on hip and back, rates it 6/10.    Eval: Pt states has difficulty sitting due to pain in her left buttock which radiates into the back of her thigh. She cannot lay flat on her back or sleep- direct pressure causes pain. Pt does not remember specific MOI. Pt cannot reach into her cabinet. Pt does have NT that does go into the toes. Pt states that walking does not bother her hip but her back pain increases. Pt states the pain worsens at night once she relaxes. Pt denies cancer red flags.   Pt states she did not have much relief from left trochanteric bursa injection. Pt reports pain with bending fwd to pick something up. She can perform in a golfer's pick up position but not with L hip flexion position. Denies leg weakness and only pain.   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 She reports no remaining deficits. This occurred 2024  Pt has trip coming up on 26th with lots of sitting/driving.   PERTINENT HISTORY: Bilat bunions, asthma, depression, HTN, OA, mixed connective tissue disease  PAIN:  Are you having pain? Yes: NPRS scale: 6/10;  Pain location: L hip down  to back of the thigh Pain description: sharp stabbing Aggravating factors: squatting, standing for long periods, figure 4, stairs  Relieving factors: crossing the leg, heating pad   PRECAUTIONS: None  RED FLAGS: None   WEIGHT BEARING RESTRICTIONS: No  FALLS:  Has patient fallen in last 6 months? Yes. Number of falls 1 on icy  driveway not related to hip  LIVING ENVIRONMENT: Lives with: lives with their spouse Lives in: House/apartment Stairs: yes but has stair lift Has following equipment at home: None  OCCUPATION: retired from [employer]   PLOF: Independent  PATIENT GOALS: return to normal activity; gardening, walking for exercise, walking in the park    OBJECTIVE:  Note:  Objective measures were completed at Evaluation unless otherwise noted.  DIAGNOSTIC FINDINGS:   XR Lumbar  Lumbar lordosis was noted.  No significant disc space narrowing was noted.   L5-S1 narrowing was noted.  No facet joint arthropathy was noted.   Impression: L5-S1 narrowing was noted.   PATIENT SURVEYS:  Modified Oswestry Low Back Pain Disability Questionnaire: 41 / 50 = 82.0 %  LEFS: Lower Extremity Functional Score: 6 / 80 = 7.5 %  COGNITION: Overall cognitive status: Within functional limits for tasks assessed     SENSATION: WFL  POSTURE: rounded shoulders, forward head, increased thoracic kyphosis, and anterior pelvic tilt  PALPATION:  TTP and hypertonicity of L glute lumbar paraspinals and QL; hypertonicity and TTP of L posterior hip/glutes/hip rotators   LUMBAR ROM:   Active  A/PROM  eval  Flexion 75% p! Into L hip and back  Extension 90%  Right lateral flexion 90%  Left lateral flexion 90%  Right rotation 90%  Left rotation 90%   (Blank rows = not tested)   LOWER EXTREMITY ROM: symmetrical and without pain at bilat hips with PROM; AROM limited and very painful on L at the hip with IR/ER and SLR flexion  LOWER EXTREMITY MMT:  MMT Right eval Left eval  Hip flexion 4+/5 4/5 p!  Hip extension    Hip abduction 4+/5 4/5 p!  Hip adduction    Hip internal rotation    Hip external rotation 4+/5 4/5 p!  Knee flexion    Knee extension    Ankle dorsiflexion 5/5 5/5  Ankle plantarflexion    Ankle inversion    Ankle eversion     (Blank rows = not tested)  LOWER EXTREMITY SPECIAL TESTS:  Hip special tests: Belvie (FABER) test: positive , SI compression test: positive , SI distraction test: negative, Hip scouring test: positive , and Piriformis test: positive   Lumbar: slump (positive)  FUNCTIONAL TESTS:  5 times sit to stand: requires UE to perform; not timed given UE support needed  GAIT: Distance walked: 69ft Assistive device utilized:  None Level of assistance: Complete Independence Comments: antalgic on L  TREATMENT DATE:   6/26  Nu step x104min L4 8 fwd step ups L LE 2x10 8 lateral step ups L LE 2x10 4 lateral step down 2x10 ea Self IASTM using tennis ball at wall Lumbar L stretch to extension x10 at back of bike Seated QL stretch with fig 4 30s 2x each Sidestepping YTB at ankles along rail x 3 laps  Discussion of self IASTM using tennis ball at home   6/5  Recumbent bike lvl 4; 2x2 min rounds  4 lateral step down 2x10 ea Lumbar L stretch to extension 2x10 with bar support Seated QL stretch with fig 4 30s 2x each Sidestepping YTB at knees 4ft 2x Lumbar flexion to lumbar extension seated 2x10   6/3 Nustep lvl 4 5 min  STM to L gluteal mm in sidelying position Supine LTR 2x10 SKTC 2x30seconds Bridge 2x10 S/l clam with iso hold 5 2x10 STS with blue band at knees  2x10  5/20  Nustep lvl 3 6 min  Supine LTR 2x10 Bridge with band 2x10 SKTC 10s 10x on L and R STS from high table focus on hip hinging motion 3x10   STM bilat lumbar paraspinals and QL   5/13 Nustep lvl 5 5 min  Sciatic n glide supine review (unable due to pain) Curl up supine 2x10 2s holds (stopped and not given for HEP due to pain)  Supine LTR 2x10 Bridge with band 2x10 Clam with iso holds 2x5 5s  STS with blue band at knees  2x10 Piriformis stretch in seated 30s 2x    5/5  Starting a walking program   Exercises - Hooklying Clamshell with Resistance  - 2 x daily - 7 x weekly - 2 sets - 10 reps - 3-5s hold - Supine Posterior Pelvic Tilt  - 2 x daily - 7 x weekly - 2 sets - 10 reps - 2 hold - Supine Bridge  - 2 x daily - 7 x weekly - 2 sets - 10 reps - Supine Sciatic Nerve Glide  - 2 x daily - 7 x weekly - 1 sets - 10 reps - 2 hold     PATIENT EDUCATION:  Education details: anatomy,  acceptable levels of pain, exercise progression, DOMS expectations, muscle firing,  envelope of function, HEP, POC   Person educated: Patient Education method: Explanation, Demonstration, Tactile cues, Verbal cues, and Handouts Education comprehension: verbalized understanding, returned demonstration, verbal cues required, and tactile cues required   HOME EXERCISE PROGRAM:    Access Code: [email or web address] URL: [email or web address] Date: 11/02/2023 Prepared by: Dale Call  ASSESSMENT:   CLINICAL IMPRESSION:   Progressed with CKC movements today with overall good tolerance. Cuing required for proper foot placement/orientation with step ups.  Instructed pt in self IASTM using tennis ball which pt felt great relief from. Reported near full resolution in symptoms following period of this in clinic. Instructed her in correct performance/frequency for this at home. Will monitor pain level as we progress.   Pt lumbar and hip mobility progressed today as well as CKC movements progressed. Pt does appear to have component of lumbar flexion intolerance that may be contributing to pain. HEP updated today to progress hip strength/desensitization as well as progressive lumbar mobility. Pt to change frequency of her visits due to insurance limitations. Plan to have pt work with current HEP and then progress to loaded lumbopelvic lifting/carrying/holding at next session dependent on sensitivity and improvement in movement tolerance.    OBJECTIVE IMPAIRMENTS: decreased activity tolerance, decreased balance, decreased  endurance, decreased mobility, decreased ROM, decreased strength, hypomobility, increased muscle spasms, impaired flexibility, improper body mechanics, postural dysfunction, and pain.    ACTIVITY LIMITATIONS: lifting, squatting, locomotion level, and dressing   PARTICIPATION LIMITATIONS: interpersonal relationship, community activity, occupation, and exercise   PERSONAL FACTORS: Age,  fitness, past/current experiences, Time since onset of injury/illness/exacerbation, and 2+ comorbidities are also affecting patient's functional outcome.    REHAB POTENTIAL: Good   CLINICAL DECISION MAKING: unstable/complicated   EVALUATION COMPLEXITY: Moderate     GOALS:     SHORT TERM GOALS: Target date: 12/14/2023        Pt will become independent with HEP in order to demonstrate synthesis of PT education.   Goal status: INITIAL   2.  Pt will have an at least 18 pt improvement in LEFS measure in order to demonstrate MCID improvement in daily function.    Goal status: INITIAL   3.  Pt will report at least 2 pt reduction on NPRS scale for pain in order to demonstrate functional improvement with household activity, self care, and ADL.    Goal status: INITIAL   LONG TERM GOALS: Target date: 01/31/2024      Pt  will become independent with final HEP in order to demonstrate synthesis of PT education.   Goal status: INITIAL   2.  Pt will demonstrate at least a 12.8 improvement in Modified Oswestry Index in order to demonstrate a clinically significant change in LBP and function.    Goal status: INITIAL   3.  Pt will be able to demonstrate/report ability to sit/stand/sleep for extended periods of time without pain in order to demonstrate functional improvement and tolerance to static positioning.     Goal status: INITIAL   4. Pt will be able to demonstrate/report ability to walk community/grocery store distances without pain in order to demonstrate functional improvement and tolerance to exercise and community mobility.    Goal status: INITIAL  5. Pt will have an at least 27 pt improvement in LEFS measure in order to demonstrate MCID improvement in daily function.    Goal status: INITIAL       PLAN:   PT FREQUENCY: 1-2x/week   PT DURATION: 12 weeks    PLANNED INTERVENTIONS: Therapeutic exercises, Therapeutic activity, Neuromuscular re-education, Balance  training, Gait training, Patient/Family education, Self Care, Joint mobilization, Joint manipulation, Stair training, Aquatic Therapy, Dry Needling, Electrical stimulation, Spinal manipulation, Spinal mobilization, Cryotherapy, Moist heat, scar mobilization, Splintting, Taping, Vasopneumatic device, Traction, Ultrasound, Ionotophoresis 4mg /ml Dexamethasone , Manual therapy, and Re-evaluation   PLAN FOR NEXT SESSION: review HEP, active warm up, consider manual though pt very sensitive to touch; longer duration isometric holds at hip,  QL stretching, weighted SB   Zayn Selley E Haylea Schlichting, PTA 12/24/2023, 2:57 PM

## 2023-12-27 ENCOUNTER — Ambulatory Visit
Admission: RE | Admit: 2023-12-27 | Discharge: 2023-12-27 | Disposition: A | Discharge status: Home / Self Care | Source: Ambulatory Visit | Attending: Family Medicine | Admitting: Family Medicine

## 2023-12-27 DIAGNOSIS — M5410 Radiculopathy, site unspecified: Secondary | ICD-10-CM

## 2023-12-31 ENCOUNTER — Encounter (HOSPITAL_BASED_OUTPATIENT_CLINIC_OR_DEPARTMENT_OTHER): Payer: Self-pay | Admitting: Physical Therapy

## 2023-12-31 ENCOUNTER — Ambulatory Visit (HOSPITAL_BASED_OUTPATIENT_CLINIC_OR_DEPARTMENT_OTHER): Attending: Rheumatology | Admitting: Physical Therapy

## 2023-12-31 DIAGNOSIS — M6281 Muscle weakness (generalized): Secondary | ICD-10-CM | POA: Insufficient documentation

## 2023-12-31 DIAGNOSIS — M25552 Pain in left hip: Secondary | ICD-10-CM | POA: Insufficient documentation

## 2023-12-31 DIAGNOSIS — M545 Low back pain, unspecified: Secondary | ICD-10-CM | POA: Diagnosis present

## 2023-12-31 DIAGNOSIS — R262 Difficulty in walking, not elsewhere classified: Secondary | ICD-10-CM | POA: Insufficient documentation

## 2023-12-31 NOTE — Therapy (Signed)
 OUTPATIENT PHYSICAL THERAPY LOWER EXTREMITY TREATMENT   Patient Name: Evelyn Baker MRN: [id number] DOB:[birth year], 63 y.o., female Today's Date: 12/31/2023  END OF SESSION:  PT End of Session - 12/31/23 1146     Visit Number 7    Number of Visits 19    Date for PT Re-Evaluation 03/30/24    Authorization Type [employer]    PT Start Time 1145    PT Stop Time 1225    PT Time Calculation (min) 40 min    Activity Tolerance Patient tolerated treatment well    Behavior During Therapy WFL for tasks assessed/performed               Past Medical History:  Diagnosis Date   Asthma    Depression    Dyspnea    Hyperlipidemia    Hypertension    Hypothyroidism    Thyroid  disease    Past Surgical History:  Procedure Laterality Date   FOOT SURGERY Left    STRABISMUS SURGERY     age 55   TOTAL ABDOMINAL HYSTERECTOMY     VEIN LIGATION AND STRIPPING Left 01/22/2021   Procedure: LEFT GREATER SAPHENOUS VEIN  LIGATION AND STRIPPING;  Surgeon: Harvey Carlin BRAVO, MD;  Location: [facility] OR;  Service: Vascular;  Laterality: Left;   Patient Active Problem List   Diagnosis Date Noted   Mixed connective tissue disease (HCC) 10/09/2023   Primary osteoarthritis of both feet 10/09/2023   Dyspareunia in female 05/05/2022   Abnormal urine 05/05/2022   Chronic constipation 05/05/2022   Fibrocystic disease of breast 05/05/2022   Obesity 05/05/2022   Reactive depression (situational) 05/05/2022   Chronic venous insufficiency 02/13/2020   Leg edema, left 02/13/2020    PCP: Katina Pfeiffer, PA-C   REFERRING PROVIDER:  Dolphus Reiter, MD     REFERRING DIAG:  M70.72 (ICD-10-CM) - Ischial bursitis of left side     THERAPY DIAG:  Pain in left hip - Plan: PT plan of care cert/re-cert  Pain, lumbar region - Plan: PT plan of care cert/re-cert  Difficulty walking - Plan: PT plan of care cert/re-cert  Muscle weakness (generalized) - Plan: PT plan of care cert/re-cert  Rationale for  Evaluation and Treatment: Rehabilitation  ONSET DATE: January 2025  SUBJECTIVE:   SUBJECTIVE STATEMENT:  Pt states that the pain is much better. She feels about 85% better. She feels less pain and is able to sleep on that side now. Pt reports NT into the toe but less pain. Pt is walking longer than she was before. She does over 6K steps a day. Pt reports less pain with squatting.    Eval: Pt states has difficulty sitting due to pain in her left buttock which radiates into the back of her thigh. She cannot lay flat on her back or sleep- direct pressure causes pain. Pt does not remember specific MOI. Pt cannot reach into her cabinet. Pt does have NT that does go into the toes. Pt states that walking does not bother her hip but her back pain increases. Pt states the pain worsens at night once she relaxes. Pt denies cancer red flags.   Pt states she did not have much relief from left trochanteric bursa injection. Pt reports pain with bending fwd to pick something up. She can perform in a golfer's pick up position but not with L hip flexion position. Denies leg weakness and only pain.   Pt does have a history of TIA/silent stroke on the R side which effected  the L. She reports no remaining deficits. This occurred 2024  Pt has trip coming up on 26th with lots of sitting/driving.   PERTINENT HISTORY: Bilat bunions, asthma, depression, HTN, OA, mixed connective tissue disease  PAIN:  Are you having pain? Yes: NPRS scale: 6/10;  Pain location: L hip down to back of the thigh Pain description: sharp stabbing Aggravating factors: squatting, standing for long periods, figure 4, stairs  Relieving factors: crossing the leg, heating pad   PRECAUTIONS: None  RED FLAGS: None   WEIGHT BEARING RESTRICTIONS: No  FALLS:  Has patient fallen in last 6 months? Yes. Number of falls 1 on icy  driveway not related to hip  LIVING ENVIRONMENT: Lives with: lives with their spouse Lives in:  House/apartment Stairs: yes but has stair lift Has following equipment at home: None  OCCUPATION: retired from [employer]   PLOF: Independent  PATIENT GOALS: return to normal activity; gardening, walking for exercise, walking in the park    OBJECTIVE:  Note: Objective measures were completed at Evaluation unless otherwise noted.  DIAGNOSTIC FINDINGS:   XR Lumbar  Lumbar lordosis was noted.  No significant disc space narrowing was noted.   L5-S1 narrowing was noted.  No facet joint arthropathy was noted.   Impression: L5-S1 narrowing was noted.     IMPRESSION: Mild multilevel degenerative spondylosis with levels described in detail above.   No significant disc protrusion or evidence of impingement.   Electronically signed by: Reyes Frees MD 12/27/2023 07:34 PM EDT RP Workstation: [id number]  PATIENT SURVEYS:  Modified Oswestry Low Back Pain Disability Questionnaire: 41 / 50 = 82.0 %  LEFS: Lower Extremity Functional Score: 6 / 80 = 7.5 %   7/3  ODI: Modified Oswestry Low Back Pain Disability Questionnaire: 7 / 50 = 14.0 %  LEFS:  Lower Extremity Functional Score: 61 / 80 = 76.3 %   LUMBAR ROM:   Active  A/PROM  eval AROM 7/3  Flexion 75% p! Into L hip and back 75% no pain; L hip pain  Extension 90% 90%  Right lateral flexion 90% 90%  Left lateral flexion 90% 90%  Right rotation 90% 90%  Left rotation 90% 90%   (Blank rows = not tested)   LOWER EXTREMITY ROM: AROM symmetrical on each side without pain, limited hip rotational mobility bilaterally, no pain with PROM   LOWER EXTREMITY MMT:  MMT Right eval R 7/3 Left eval L 7/3  Hip flexion 4+/5 4+/5 4/5 p! 4+/5  Hip extension      Hip abduction 4+/5 4+/5 4/5 p! 4+/5  Hip adduction      Hip internal rotation      Hip external rotation 4+/5 4+/5 4/5 p! 4+/5  Knee flexion      Knee extension      Ankle dorsiflexion 5/5 4+/5 5/5 4+/5  Ankle plantarflexion      Ankle inversion      Ankle  eversion       (Blank rows = not tested)   FUNCTIONAL TESTS:  5 times sit to stand: requires UE to perform; not timed given UE support needed  5xSTS 7/3: 12.1s  GAIT: Distance walked: 88ft Assistive device utilized: None Level of assistance: Complete Independence Comments: antalgic on L  TREATMENT DATE:   7/3  Exam findings and implications of findings, HEP, POC  STM to L QL and lumbar paraspinals in S/L  Bridge with RTB at knees 3x10 STS with RTB at knees 2x10   6/26  Nu step x61min L4 8 fwd step ups L LE 2x10 8 lateral step ups L LE 2x10 4 lateral step down 2x10 ea Self IASTM using tennis ball at wall Lumbar L stretch to extension x10 at back of bike Seated QL stretch with fig 4 30s 2x each Sidestepping YTB at ankles along rail x 3 laps  Discussion of self IASTM using tennis ball at home   6/5  Recumbent bike lvl 4; 2x2 min rounds  4 lateral step down 2x10 ea Lumbar L stretch to extension 2x10 with bar support Seated QL stretch with fig 4 30s 2x each Sidestepping YTB at knees 20ft 2x Lumbar flexion to lumbar extension seated 2x10   6/3 Nustep lvl 4 5 min  STM to L gluteal mm in sidelying position Supine LTR 2x10 SKTC 2x30seconds Bridge 2x10 S/l clam with iso hold 5 2x10 STS with blue band at knees  2x10  5/20  Nustep lvl 3 6 min  Supine LTR 2x10 Bridge with band 2x10 SKTC 10s 10x on L and R STS from high table focus on hip hinging motion 3x10   STM bilat lumbar paraspinals and QL   5/13 Nustep lvl 5 5 min  Sciatic n glide supine review (unable due to pain) Curl up supine 2x10 2s holds (stopped and not given for HEP due to pain)  Supine LTR 2x10 Bridge with band 2x10 Clam with iso holds 2x5 5s  STS with blue band at knees  2x10 Piriformis stretch in seated 30s 2x    5/5  Starting a walking program    Exercises - Hooklying Clamshell with Resistance  - 2 x daily - 7 x weekly - 2 sets - 10 reps - 3-5s hold - Supine Posterior Pelvic Tilt  - 2 x daily - 7 x weekly - 2 sets - 10 reps - 2 hold - Supine Bridge  - 2 x daily - 7 x weekly - 2 sets - 10 reps - Supine Sciatic Nerve Glide  - 2 x daily - 7 x weekly - 1 sets - 10 reps - 2 hold     PATIENT EDUCATION:  Education details: anatomy, acceptable levels of pain, exercise progression, DOMS expectations, muscle firing,  envelope of function, HEP, POC   Person educated: Patient Education method: Explanation, Demonstration, Tactile cues, Verbal cues, and Handouts Education comprehension: verbalized understanding, returned demonstration, verbal cues required, and tactile cues required   HOME EXERCISE PROGRAM:    Access Code: [email or web address] URL: [email or web address] Date: 11/02/2023 Prepared by: Dale Call  ASSESSMENT:   CLINICAL IMPRESSION:  Pt demonstrates significant subjective and objective improvements as demonstrated above with clinical testing. Pt with significant improvement in mobility and ADL with significant decrease in L hip and LBP. NT still present in toes but no myopathic weakness noted. Pt still limited with her heavy activity and extended standing/sitting but is making steady progress with therapy. Pt to decrease frequency of POC to try and promote independent exercise/pain management.  Pt feels she is still most limited in her ability to drive for long distances as well as stand to cook/clean. Pt would benefit from continued skilled therapy in order to reach goals and maximize functional lumobpelvic strength and ROM for return to PLOF, normalized ADL, and community mobility.  OBJECTIVE IMPAIRMENTS: decreased activity tolerance, decreased balance, decreased endurance, decreased mobility, decreased ROM, decreased strength, hypomobility, increased muscle spasms, impaired flexibility, improper body mechanics, postural  dysfunction, and pain.    ACTIVITY LIMITATIONS: lifting, squatting, locomotion level, and dressing   PARTICIPATION LIMITATIONS: interpersonal relationship, community activity, occupation, and exercise   PERSONAL FACTORS: Age, fitness, past/current experiences, Time since onset of injury/illness/exacerbation, and 2+ comorbidities are also affecting patient's functional outcome.    REHAB POTENTIAL: Good   CLINICAL DECISION MAKING: unstable/complicated   EVALUATION COMPLEXITY: Moderate     GOALS:     SHORT TERM GOALS: Target date: 12/14/2023        Pt will become independent with HEP in order to demonstrate synthesis of PT education.   Goal status: MET   2.  Pt will have an at least 18 pt improvement in LEFS measure in order to demonstrate MCID improvement in daily function.    Goal status: MET  3.  Pt will report at least 2 pt reduction on NPRS scale for pain in order to demonstrate functional improvement with household activity, self care, and ADL.    Goal status: MET   LONG TERM GOALS: Target date: 03/30/2024       Pt  will become independent with final HEP in order to demonstrate synthesis of PT education.   Goal status: ongoing   2.  Pt will demonstrate at least a 12.8 improvement in Modified Oswestry Index in order to demonstrate a clinically significant change in LBP and function.    Goal status: MET   3.  Pt will be able to demonstrate/report ability to sit/stand/sleep for extended periods of time without pain in order to demonstrate functional improvement and tolerance to static positioning.     Goal status: ongoing   4. Pt will be able to demonstrate/report ability to walk community/grocery store distances without pain in order to demonstrate functional improvement and tolerance to exercise and community mobility.    Goal status: ongoing  5. Pt will have an at least 27 pt improvement in LEFS measure in order to demonstrate MCID improvement in daily  function.    Goal status: MET       PLAN:   PT FREQUENCY: 1x/every other or 1x/every 2-3 wks   PT DURATION: 12 weeks    PLANNED INTERVENTIONS: Therapeutic exercises, Therapeutic activity, Neuromuscular re-education, Balance training, Gait training, Patient/Family education, Self Care, Joint mobilization, Joint manipulation, Stair training, Aquatic Therapy, Dry Needling, Electrical stimulation, Spinal manipulation, Spinal mobilization, Cryotherapy, Moist heat, scar mobilization, Splintting, Taping, Vasopneumatic device, Traction, Ultrasound, Ionotophoresis 4mg /ml Dexamethasone , Manual therapy, and Re-evaluation   PLAN FOR NEXT SESSION: review HEP, active warm up, consider manual though pt very sensitive to touch; longer duration isometric holds at hip,  QL stretching, weighted SB   Dale Call, PT 12/31/2023, 12:47 PM    For all possible CPT codes, reference the Planned Interventions line above.     Check all conditions that are expected to impact treatment: {Conditions expected to impact treatment:Respiratory disorders, Active major medical illness, and Unknown   If treatment provided at initial evaluation, no treatment charged due to lack of authorization.

## 2024-01-13 ENCOUNTER — Encounter: Payer: Self-pay | Admitting: Cardiology

## 2024-01-14 ENCOUNTER — Other Ambulatory Visit (HOSPITAL_COMMUNITY): Payer: Self-pay

## 2024-01-14 MED ORDER — CHLORTHALIDONE 25 MG PO TABS
25.0000 mg | ORAL_TABLET | Freq: Every day | ORAL | 2 refills | Status: DC
  Filled 2024-01-14: qty 90, 90d supply, fill #0
  Filled 2024-04-14: qty 90, 90d supply, fill #1

## 2024-01-14 NOTE — Telephone Encounter (Signed)
 New rx for chlorthalidone  25 mg po daily was sent to the pts confirmed pharmacy of choice.

## 2024-01-14 NOTE — Telephone Encounter (Signed)
 Patient is correct. She did not tolerate spironolactone  and she wanted to go back to potassium supplementation & chlorthalidone  25 mg p.o. every morning. Refill the medication for 90 days. See prior telephone encounters.   Dr. Taesha Goodell

## 2024-01-18 ENCOUNTER — Encounter (HOSPITAL_COMMUNITY): Payer: Self-pay

## 2024-01-18 ENCOUNTER — Other Ambulatory Visit: Payer: Self-pay

## 2024-01-18 ENCOUNTER — Other Ambulatory Visit (HOSPITAL_COMMUNITY): Payer: Self-pay

## 2024-01-18 ENCOUNTER — Other Ambulatory Visit: Payer: Self-pay | Admitting: Neurology

## 2024-01-20 ENCOUNTER — Other Ambulatory Visit (HOSPITAL_COMMUNITY): Payer: Self-pay

## 2024-01-20 MED ORDER — CLOPIDOGREL BISULFATE 75 MG PO TABS
75.0000 mg | ORAL_TABLET | Freq: Every day | ORAL | 0 refills | Status: DC
  Filled 2024-01-20: qty 90, 90d supply, fill #0

## 2024-02-11 ENCOUNTER — Other Ambulatory Visit (HOSPITAL_COMMUNITY): Payer: Self-pay

## 2024-02-11 MED ORDER — EPINEPHRINE 0.3 MG/0.3ML IJ SOAJ
0.3000 mg | INTRAMUSCULAR | 1 refills | Status: AC | PRN
  Filled 2024-02-11: qty 2, 15d supply, fill #0

## 2024-02-15 ENCOUNTER — Encounter: Payer: Self-pay | Admitting: Cardiology

## 2024-02-15 DIAGNOSIS — I10 Essential (primary) hypertension: Secondary | ICD-10-CM

## 2024-02-15 DIAGNOSIS — Z79899 Other long term (current) drug therapy: Secondary | ICD-10-CM

## 2024-02-16 NOTE — Telephone Encounter (Signed)
 Yes, may reduce Valsartan  to 160mg  po day - given her weight loss and reduction of salt intake.  Continue to monitor the BP at home.  BMP in one week.  Verify the meds and doses due to recent changes and update the list if needed.   Anvi Mangal Benton, DO, FACC

## 2024-02-17 ENCOUNTER — Other Ambulatory Visit (HOSPITAL_COMMUNITY): Payer: Self-pay

## 2024-02-17 MED ORDER — VALSARTAN 160 MG PO TABS
160.0000 mg | ORAL_TABLET | Freq: Every day | ORAL | 3 refills | Status: DC
  Filled 2024-02-17: qty 90, 90d supply, fill #0

## 2024-02-18 NOTE — Progress Notes (Signed)
 Office Visit Note  Patient: Evelyn Baker             Date of Birth: 1960/11/04           MRN: [id number]             PCP: Katina Pfeiffer, PA-C Referring: Katina Pfeiffer, PA-C Visit Date: 03/03/2024 Occupation: @GUAROCC @  Subjective:  Medication management   History of Present Illness: Evelyn Baker is a 63 y.o. female with mixed connective tissue disease and osteoarthritis.  She returns today after her last visit in April 2025.  She was accompanied by her husband.  She states she continues to have some left gluteal pain and left thigh pain which is coming from her back.  She has an appointment with Dr. Eldonna for an injection.  She continues to have some stiffness in her hands.  None of the other joints are painful.  She gives history of fatigue, dry mouth, dry eyes, arthralgias.  There is no history of chest pain, palpitations or shortness of breath.  She denies history of Raynaud's, lymphadenopathy or inflammatory arthritis.  She has been taking Imuran  100 mg p.o. daily without any interruption.    Activities of Daily Living:  Patient reports morning stiffness for 0 minute.   Patient Reports nocturnal pain.  Difficulty dressing/grooming: Reports Difficulty climbing stairs: Denies Difficulty getting out of chair: Reports Difficulty using hands for taps, buttons, cutlery, and/or writing: Denies  Review of Systems  Constitutional:  Negative for fatigue.  HENT:  Positive for mouth dryness. Negative for mouth sores.   Eyes:  Positive for dryness.  Respiratory:  Negative for shortness of breath.   Cardiovascular:  Negative for chest pain and palpitations.  Gastrointestinal:  Positive for constipation. Negative for blood in stool and diarrhea.  Endocrine: Negative for increased urination.  Genitourinary:  Negative for involuntary urination.  Musculoskeletal:  Positive for joint pain, gait problem, joint pain, myalgias and myalgias. Negative for joint swelling, muscle  weakness, morning stiffness and muscle tenderness.  Skin:  Negative for color change, rash, hair loss and sensitivity to sunlight.  Allergic/Immunologic: Negative for susceptible to infections.  Neurological:  Negative for dizziness and headaches.  Hematological:  Negative for swollen glands.  Psychiatric/Behavioral:  Positive for depressed mood and sleep disturbance. The patient is not nervous/anxious.     PMFS History:  Patient Active Problem List   Diagnosis Date Noted   Mixed connective tissue disease (HCC) 10/09/2023   Primary osteoarthritis of both feet 10/09/2023   Dyspareunia in female 05/05/2022   Abnormal urine 05/05/2022   Chronic constipation 05/05/2022   Fibrocystic disease of breast 05/05/2022   Obesity 05/05/2022   Reactive depression (situational) 05/05/2022   Chronic venous insufficiency 02/13/2020   Leg edema, left 02/13/2020    Past Medical History:  Diagnosis Date   Asthma    Depression    Dyspnea    Hyperlipidemia    Hypertension    Hypothyroidism    Sciatic nerve disease    Thyroid  disease     Family History  Problem Relation Age of Onset   Heart failure Mother    Kidney failure Mother    Stroke Sister    Aneurysm Sister    Heart Problems Brother    Hypertension Son    Hypertension Daughter    Past Surgical History:  Procedure Laterality Date   FOOT SURGERY Left    STRABISMUS SURGERY     age 66   TOTAL ABDOMINAL HYSTERECTOMY  VEIN LIGATION AND STRIPPING Left 01/22/2021   Procedure: LEFT GREATER SAPHENOUS VEIN  LIGATION AND STRIPPING;  Surgeon: Harvey Carlin BRAVO, MD;  Location: [facility] OR;  Service: Vascular;  Laterality: Left;   Social History   Social History Narrative   Not on file   Immunization History  Administered Date(s) Administered   Influenza-Unspecified 03/30/2014, 07/13/2019, 03/01/2023     Objective: Vital Signs: BP (!) 153/80 (BP Location: Left Arm, Patient Position: Sitting, Cuff Size: Normal)   Pulse 78   Resp 14    Ht 5' 5 (1.651 m)   Wt 197 lb 3.2 oz (89.4 kg)   BMI 32.82 kg/m    Physical Exam Vitals and nursing note reviewed.  Constitutional:      Appearance: She is well-developed.  HENT:     Head: Normocephalic and atraumatic.  Eyes:     Conjunctiva/sclera: Conjunctivae normal.  Cardiovascular:     Rate and Rhythm: Normal rate and regular rhythm.     Heart sounds: Normal heart sounds.  Pulmonary:     Effort: Pulmonary effort is normal.     Breath sounds: Normal breath sounds.  Abdominal:     General: Bowel sounds are normal.     Palpations: Abdomen is soft.  Musculoskeletal:     Cervical back: Normal range of motion.  Lymphadenopathy:     Cervical: No cervical adenopathy.  Skin:    General: Skin is warm and dry.     Capillary Refill: Capillary refill takes less than 2 seconds.  Neurological:     Mental Status: She is alert and oriented to person, place, and time.  Psychiatric:        Behavior: Behavior normal.      Musculoskeletal Exam: Cervical spine was in good range of motion.  She had painful limited range of motion of her lumbar spine.  Shoulders, elbows, wrist joints, MCPs PIPs and DIPs with good range of motion with no synovitis.  Hip joints, knee joints, ankles, MTPs and PIPs were in good range of motion with no synovitis.  CDAI Exam: CDAI Score: -- Patient Global: --; Provider Global: -- Swollen: --; Tender: -- Joint Exam 03/03/2024   No joint exam has been documented for this visit   There is currently no information documented on the homunculus. Go to the Rheumatology activity and complete the homunculus joint exam.  Investigation: No additional findings.  Imaging: No results found.  Recent Labs: Lab Results  Component Value Date   WBC 4.1 09/30/2023   HGB 12.1 09/30/2023   PLT 247 09/30/2023   NA 140 11/06/2023   K 4.0 11/06/2023   CL 100 11/06/2023   CO2 25 11/06/2023   GLUCOSE 131 (H) 11/06/2023   BUN 21 11/06/2023   CREATININE 0.99 11/06/2023    BILITOT 0.7 10/23/2023   ALKPHOS 69 10/23/2023   AST 18 10/23/2023   ALT 13 10/23/2023   PROT 7.4 10/23/2023   ALBUMIN 3.9 10/23/2023   CALCIUM  9.9 11/06/2023    Speciality Comments: No specialty comments available.  Procedures:  No procedures performed Allergies: Shellfish allergy, Avocado, Codeine , Iodinated contrast media, Pineapple, and Penicillins   Assessment / Plan:     Visit Diagnoses: Mixed connective tissue disease (HCC) - Positive ANA, positive SSA, positive SSB, positive RF, inflammatory arthritis,sicca symptoms:dxd Dr. Curt 07/24.  Patient has history of fatigue, sicca symptoms.  She denies any history of joint or joint swelling today.  She has intermittent discomfort in her hands.  There is no history of chest  pain or shortness of breath.  She has been followed by pulmonary and cardiology.  I will recheck her autoimmune labs today which will include CBC, CMP, urine protein creatinine ratio, ANA, RNP, rheumatoid factor, dsDNA and complements.  She was last evaluated by Dr. Tonna on August 12, 2023.  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  She was placed on Symbicort .  She had high-resolution CT on December 18, 2023.  Patient states she has an appointment with Dr. Geronimo next month to discuss the results.  High risk medication use - Imuran  100 mg p.o. daily (since August 2024) dose was reduced from 150 mg daily due to GI side effects.  CMP was normal in April 2025.  CBC was normal in April 2025.  Information reimmunization was placed in the AVS.  She was advised to get labs every 3 months.  She was advised to hold Imuran  if she gets an infection resume until infection resolves.  Pain in both hands -she continues to have some stiffness in her hands.  No synovitis was noted.  Joint protection was discussed.  X-ray obtained initially showed early osteoarthritic changes.  Ischial bursitis of left side - referred to physical therapy at the last visit.  She had no improvement.  She  believes the pain is coming from her lower back.  She has an appointment coming up with Dr. Eldonna.  Chronic pain of both knees -she has intermittent discomfort in her knee joints.  No warmth swelling or effusion was noted.  X-ray obtained in the past showed mild osteoarthritis and mild chondromalacia patella.  Pain in left ankle and joints of left foot - X-rays were unremarkable.  No tenderness on the examination today.  Primary osteoarthritis of both feet - Clinical and radiographic findings were suggestive of osteoarthritis.  There is no tenderness or swelling on the examination today.  Chronic midline low back pain without sciatica - X-rays showed L5-S1 narrowing.  She continues to have lower back pain and left-sided sciatica.  She is scheduled to have lumbar spine injection by Dr. Eldonna.  Primary hypertension-blood pressure was elevated at 169/77.  Repeat blood pressure was 153/80.  She was advised to monitor the pressure closely and follow-up with her PCP.  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  She is under care of neurology and cardiology.  She close remains on anticoagulants.  Chronic venous insufficiency  Dyslipidemia  Hypertensive retinopathy of both eyes  Daytime somnolence  Gastroesophageal reflux disease without esophagitis  Other specified hypothyroidism  Reactive depression  History of DVT (deep vein thrombosis) - At age 58 during pregnancy.  History of asthma  Family history of rheumatoid arthritis-great aunt  Orders: Orders Placed This Encounter  Procedures   Protein / creatinine ratio, urine   CBC with Differential/Platelet   Comprehensive metabolic panel with GFR   Anti-DNA antibody, double-stranded   ANA   C3 and C4   Sedimentation rate   Rheumatoid factor   Serum protein electrophoresis with reflex   RNP Antibodies   No orders of the defined types were placed in this encounter.    Follow-Up Instructions:  Return in 5 months (on 08/03/2024) for MCTD.   Maya Nash, MD  Note - This record has been created using Animal nutritionist.  Chart creation errors have been sought, but may not always  have been located. Such creation errors do not reflect on  the standard of medical care.

## 2024-02-23 ENCOUNTER — Other Ambulatory Visit (HOSPITAL_COMMUNITY): Payer: Self-pay

## 2024-02-26 ENCOUNTER — Encounter (HOSPITAL_BASED_OUTPATIENT_CLINIC_OR_DEPARTMENT_OTHER): Payer: Self-pay | Admitting: Physician Assistant

## 2024-02-26 ENCOUNTER — Ambulatory Visit (INDEPENDENT_AMBULATORY_CARE_PROVIDER_SITE_OTHER): Admitting: Physician Assistant

## 2024-02-26 DIAGNOSIS — M5442 Lumbago with sciatica, left side: Secondary | ICD-10-CM | POA: Diagnosis not present

## 2024-02-26 DIAGNOSIS — G8929 Other chronic pain: Secondary | ICD-10-CM

## 2024-02-26 NOTE — Progress Notes (Signed)
 Office Visit Note   Patient: Evelyn Baker           Date of Birth: [birth year]           MRN: [id number] Visit Date: 02/26/2024              Requested by: Katina Pfeiffer, PA-C [address] [place],  [place] [address] PCP: Katina Pfeiffer, PA-C   Assessment & Plan: Visit Diagnoses: Low back pain  Plan: Pleasant 63 year old woman now has almost 33-month history of low back pain radiating down her left buttock down the back of her left leg after a fall at home.  She really did not have any problems before.  She did get an MRI in June that demonstrated facet hypertrophy and disc bulging on the left at L5-S1.  She has failed conservative treatment.  Unfortunately she cannot take anti-inflammatories because she is on Plavix .  Will make a referral since she has an updated MRI to Dr. Eldonna for possible ESI  Follow-Up Instructions: Referral to Dr. Eldonna  Orders:  No orders of the defined types were placed in this encounter.  No orders of the defined types were placed in this encounter.     Procedures: No procedures performed   Clinical Data: No additional findings.   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  She comes in today with a history of ongoing since February of low back pain radiating down her left leg down the back of her leg.  She said this may have been related to a fall she had earlier in the year.  She has had extensive conservative treatment including physical therapy, chiropractic care.  She cannot take anti-inflammatories because she is currently on Plavix .  She has had no other history of problems with her back in the past.  Denies any loss of bowel or bladder control or any strength issues  Review of Systems  All other systems reviewed and are negative.    Objective: Vital Signs:  There were no vitals taken for this visit.  Physical Exam Constitutional:      Appearance: Normal appearance.  Pulmonary:     Effort: Pulmonary effort is normal.  Skin:    General: Skin is warm and dry.  Neurological:     Mental Status: She is alert.  Psychiatric:        Mood and Affect: Mood normal.        Behavior: Behavior normal.     Ortho Exam Examination she has pain with transition from sitting to standing.  She has pain in the lower back radiates down her left buttock down her leg.  She has good strength with resisted dorsiflexion plantarflexion extension flexion of her legs her sensation is intact.  Palpation of her spine demonstrates no step-offs no redness no erythema Specialty Comments:  No specialty comments available.  Imaging: No results found.   PMFS History: Patient Active Problem List   Diagnosis Date Noted   Mixed connective tissue disease (HCC) 10/09/2023   Primary osteoarthritis of both feet 10/09/2023   Dyspareunia in female 05/05/2022   Abnormal urine 05/05/2022   Chronic constipation 05/05/2022   Fibrocystic disease of breast 05/05/2022   Obesity 05/05/2022   Reactive depression (situational) 05/05/2022   Chronic venous insufficiency 02/13/2020   Leg edema, left 02/13/2020  Past Medical History:  Diagnosis Date   Asthma    Depression    Dyspnea    Hyperlipidemia    Hypertension    Hypothyroidism    Thyroid  disease     Family History  Problem Relation Age of Onset   Heart failure Mother    Kidney failure Mother    Stroke Sister    Aneurysm Sister    Heart Problems Brother    Hypertension Son    Hypertension Daughter     Past Surgical History:  Procedure Laterality Date   FOOT SURGERY Left    STRABISMUS SURGERY     age 71   TOTAL ABDOMINAL HYSTERECTOMY     VEIN LIGATION AND STRIPPING Left 01/22/2021   Procedure: LEFT GREATER SAPHENOUS VEIN  LIGATION AND STRIPPING;  Surgeon: Harvey Carlin BRAVO, MD;  Location: [facility] OR;  Service:  Vascular;  Laterality: Left;   Social History   Occupational History   Not on file  Tobacco Use   Smoking status: Never    Passive exposure: Never   Smokeless tobacco: Never  Vaping Use   Vaping status: Never Used  Substance and Sexual Activity   Alcohol use: No   Drug use: No   Sexual activity: Yes    Birth control/protection: Surgical    Comment: partial hysterectomy

## 2024-03-01 ENCOUNTER — Telehealth: Payer: Self-pay | Admitting: Physical Medicine and Rehabilitation

## 2024-03-01 ENCOUNTER — Telehealth: Payer: Self-pay | Admitting: Neurology

## 2024-03-01 NOTE — Telephone Encounter (Signed)
Patient called. She would like an appointment with Dr. Newton.  

## 2024-03-01 NOTE — Telephone Encounter (Signed)
 Spoke to patient and advised her we are waiting on approval to D/C Plavix  and PA from insurance

## 2024-03-01 NOTE — Telephone Encounter (Signed)
 Received a surgical clearance request form to be completed and signed for the pt. Completed and placed in MD office to sign.

## 2024-03-03 ENCOUNTER — Encounter: Payer: Self-pay | Admitting: Rheumatology

## 2024-03-03 ENCOUNTER — Telehealth: Payer: Self-pay

## 2024-03-03 ENCOUNTER — Ambulatory Visit: Attending: Rheumatology | Admitting: Rheumatology

## 2024-03-03 VITALS — BP 153/80 | HR 78 | Resp 14 | Ht 65.0 in | Wt 197.2 lb

## 2024-03-03 DIAGNOSIS — I6381 Other cerebral infarction due to occlusion or stenosis of small artery: Secondary | ICD-10-CM | POA: Insufficient documentation

## 2024-03-03 DIAGNOSIS — I872 Venous insufficiency (chronic) (peripheral): Secondary | ICD-10-CM | POA: Insufficient documentation

## 2024-03-03 DIAGNOSIS — M79642 Pain in left hand: Secondary | ICD-10-CM | POA: Diagnosis present

## 2024-03-03 DIAGNOSIS — M79641 Pain in right hand: Secondary | ICD-10-CM | POA: Insufficient documentation

## 2024-03-03 DIAGNOSIS — F329 Major depressive disorder, single episode, unspecified: Secondary | ICD-10-CM | POA: Insufficient documentation

## 2024-03-03 DIAGNOSIS — Z8261 Family history of arthritis: Secondary | ICD-10-CM | POA: Insufficient documentation

## 2024-03-03 DIAGNOSIS — M351 Other overlap syndromes: Secondary | ICD-10-CM | POA: Diagnosis not present

## 2024-03-03 DIAGNOSIS — M545 Low back pain, unspecified: Secondary | ICD-10-CM | POA: Diagnosis present

## 2024-03-03 DIAGNOSIS — Z86718 Personal history of other venous thrombosis and embolism: Secondary | ICD-10-CM | POA: Insufficient documentation

## 2024-03-03 DIAGNOSIS — K219 Gastro-esophageal reflux disease without esophagitis: Secondary | ICD-10-CM | POA: Diagnosis present

## 2024-03-03 DIAGNOSIS — E038 Other specified hypothyroidism: Secondary | ICD-10-CM | POA: Insufficient documentation

## 2024-03-03 DIAGNOSIS — M19071 Primary osteoarthritis, right ankle and foot: Secondary | ICD-10-CM | POA: Diagnosis present

## 2024-03-03 DIAGNOSIS — M25572 Pain in left ankle and joints of left foot: Secondary | ICD-10-CM | POA: Diagnosis present

## 2024-03-03 DIAGNOSIS — G8929 Other chronic pain: Secondary | ICD-10-CM | POA: Insufficient documentation

## 2024-03-03 DIAGNOSIS — M25561 Pain in right knee: Secondary | ICD-10-CM | POA: Diagnosis present

## 2024-03-03 DIAGNOSIS — M25562 Pain in left knee: Secondary | ICD-10-CM | POA: Diagnosis present

## 2024-03-03 DIAGNOSIS — I10 Essential (primary) hypertension: Secondary | ICD-10-CM | POA: Insufficient documentation

## 2024-03-03 DIAGNOSIS — R400 Somnolence: Secondary | ICD-10-CM | POA: Insufficient documentation

## 2024-03-03 DIAGNOSIS — Z79899 Other long term (current) drug therapy: Secondary | ICD-10-CM | POA: Diagnosis present

## 2024-03-03 DIAGNOSIS — E785 Hyperlipidemia, unspecified: Secondary | ICD-10-CM | POA: Insufficient documentation

## 2024-03-03 DIAGNOSIS — H35033 Hypertensive retinopathy, bilateral: Secondary | ICD-10-CM | POA: Insufficient documentation

## 2024-03-03 DIAGNOSIS — M7072 Other bursitis of hip, left hip: Secondary | ICD-10-CM | POA: Insufficient documentation

## 2024-03-03 DIAGNOSIS — M19072 Primary osteoarthritis, left ankle and foot: Secondary | ICD-10-CM | POA: Diagnosis present

## 2024-03-03 DIAGNOSIS — F411 Generalized anxiety disorder: Secondary | ICD-10-CM

## 2024-03-03 DIAGNOSIS — Z8709 Personal history of other diseases of the respiratory system: Secondary | ICD-10-CM | POA: Insufficient documentation

## 2024-03-03 NOTE — Patient Instructions (Signed)
 Standing Labs We placed an order today for your standing lab work.   Please have your standing labs drawn in  December and every 3 month  Please have your labs drawn 2 weeks prior to your appointment so that the provider can discuss your lab results at your appointment, if possible.  Please note that you may see your imaging and lab results in MyChart before we have reviewed them. We will contact you once all results are reviewed. Please allow our office up to 72 hours to thoroughly review all of the results before contacting the office for clarification of your results.  WALK-IN LAB HOURS  Monday through Thursday from 8:00 am -12:30 pm and 1:00 pm-4:30 pm and Friday from 8:00 am-12:00 pm.  Patients with office visits requiring labs will be seen before walk-in labs.  You may encounter longer than normal wait times. Please allow additional time. Wait times may be shorter on  Monday and Thursday afternoons.  We do not book appointments for walk-in labs. We appreciate your patience and understanding with our staff.   Labs are drawn by [facility]. Please bring your co-pay at the time of your lab draw.  You may receive a bill from [facility] for your lab work.  Please note if you are on Hydroxychloroquine and and an order has been placed for a Hydroxychloroquine level,  you will need to have it drawn 4 hours or more after your last dose.  If you wish to have your labs drawn at another location, please call the office 24 hours in advance so we can fax the orders.  The office is located at [address], [address], [place], [place] [address]   If you have any questions regarding directions or hours of operation,  please call [phone].   As a reminder, please drink plenty of water prior to coming for your lab work. Thanks!   Vaccines You are taking a medication(s) that can suppress your immune system.  The following immunizations are recommended: Flu annually Covid-19  Td/Tdap (tetanus,  diphtheria, pertussis) every 10 years Pneumonia (Prevnar 15 then Pneumovax 23 at least 1 year apart.  Alternatively, can take Prevnar 20 without needing additional dose) Shingrix: 2 doses from 4 weeks to 6 months apart  Please check with your PCP to make sure you are up to date.   If you have signs or symptoms of an infection or start antibiotics: First, call your PCP for workup of your infection. Hold your medication through the infection, until you complete your antibiotics, and until symptoms resolve if you take the following: Injectable medication (Actemra, Benlysta, Cimzia, Cosentyx, Enbrel, Humira, Kevzara, Orencia, Remicade, Simponi, Stelara, Taltz, Tremfya) Methotrexate Leflunomide (Arava) Mycophenolate (Cellcept) Earma, Olumiant, or Rinvoq

## 2024-03-03 NOTE — Telephone Encounter (Signed)
 Pre medication requested

## 2024-03-04 ENCOUNTER — Other Ambulatory Visit (HOSPITAL_COMMUNITY): Payer: Self-pay

## 2024-03-04 MED ORDER — DIAZEPAM 5 MG PO TABS
5.0000 mg | ORAL_TABLET | Freq: Once | ORAL | 0 refills | Status: AC
  Filled 2024-03-04: qty 1, 1d supply, fill #0

## 2024-03-04 NOTE — Addendum Note (Signed)
 Addended by: ELDONNA GARDINER POUR on: 03/04/2024 08:19 AM   Modules accepted: Orders

## 2024-03-06 ENCOUNTER — Ambulatory Visit: Payer: Self-pay | Admitting: Internal Medicine

## 2024-03-06 DIAGNOSIS — R918 Other nonspecific abnormal finding of lung field: Secondary | ICD-10-CM

## 2024-03-06 DIAGNOSIS — R911 Solitary pulmonary nodule: Secondary | ICD-10-CM

## 2024-03-06 NOTE — Telephone Encounter (Signed)
 Evelyn Baker  - blood work from feb 2025 is all normal.  The CT chest end of June 2025 shows Left Lower Lobe nodule tht is slightly larger (I think they mean 9.45mm and not cm)  She is a non-smoker  Plan Let her know ASAP I ordered a SuPER D CT chst ASAP for <  1 week Please set a followup with Dr Shelah ASAP        Latest Ref Rng & Units 12/21/2023   11:15 AM  PFT Results  FVC-Pre L 2.13   FVC-Predicted Pre % 62   FVC-Post L 2.08   FVC-Predicted Post % 60   Pre FEV1/FVC % % 77   Post FEV1/FCV % % 80   FEV1-Pre L 1.63   FEV1-Predicted Pre % 61   FEV1-Post L 1.66   DLCO uncorrected ml/min/mmHg 16.63   DLCO UNC% % 79   DLCO corrected ml/min/mmHg 16.62   DLCO COR %Predicted % 79   DLVA Predicted % 124   TLC L 4.43   TLC % Predicted % 85   RV % Predicted % 107    CT chest June 2025   COMPARISON:  Chest CT December 26, 2022   FINDINGS: Cardiovascular: The heart size is normal. Trace pericardial fluid. Trace atherosclerotic calcification of the aorta.   Mediastinum/Nodes: A few multi station subcentimeter mediastinal lymph nodes, within normal range for CT size criteria. Multiple bilateral axillary lymphadenopathy measuring up to 2.3 cm on the right and 1.1 cm on the left.   Lungs/Pleura: There is a subsolid nodule measuring 9.7 cm in left lower lobe (9/23), previously measured 7 mm. This nodule was previously mild ground-glass 5 mm on exam from August 07, 2022. IMPRESSION: 1. 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 Differentials include infection/inflammation. Please note subsolid nodules may appear may appear low FDG avid on PET-CT. Recommend further assessment with tissue sampling or PET-CT. 2. Additional scattered and clusters of micronodules, similar to prior and likely benign. Follow-up to ensure stability. 3.  Bronchial and bronchiolar wall thickening suggestive of medium and large airway disease.     Electronically Signed   By: Evelyn  Baker M.D.   On: 12/22/2023 16:41    reports that she has never smoked. She has never been exposed to tobacco smoke. She has never used smokeless tobacco.

## 2024-03-07 NOTE — Telephone Encounter (Signed)
 I called the pt and there was no answer- LMTCB. ?

## 2024-03-13 ENCOUNTER — Ambulatory Visit (HOSPITAL_BASED_OUTPATIENT_CLINIC_OR_DEPARTMENT_OTHER)

## 2024-03-15 ENCOUNTER — Other Ambulatory Visit (HOSPITAL_COMMUNITY): Payer: Self-pay

## 2024-03-15 MED ORDER — DOXYCYCLINE HYCLATE 100 MG PO TABS
100.0000 mg | ORAL_TABLET | Freq: Two times a day (BID) | ORAL | 0 refills | Status: AC
  Filled 2024-03-15: qty 20, 10d supply, fill #0

## 2024-03-15 MED ORDER — PREDNISONE 20 MG PO TABS
20.0000 mg | ORAL_TABLET | Freq: Two times a day (BID) | ORAL | 0 refills | Status: DC
  Filled 2024-03-15: qty 10, 5d supply, fill #0

## 2024-03-15 NOTE — Telephone Encounter (Signed)
 PCC's, please schedule CT. Thanks

## 2024-03-16 ENCOUNTER — Encounter: Payer: Self-pay | Admitting: Cardiology

## 2024-03-16 ENCOUNTER — Telehealth: Payer: Self-pay

## 2024-03-16 ENCOUNTER — Ambulatory Visit: Payer: Self-pay | Admitting: Rheumatology

## 2024-03-16 DIAGNOSIS — M351 Other overlap syndromes: Secondary | ICD-10-CM

## 2024-03-16 DIAGNOSIS — R7989 Other specified abnormal findings of blood chemistry: Secondary | ICD-10-CM

## 2024-03-16 DIAGNOSIS — R809 Proteinuria, unspecified: Secondary | ICD-10-CM

## 2024-03-16 NOTE — Progress Notes (Signed)
 C4 is low at 10, sed rate is elevated 49 which is stable.  Rheumatoid factor remains positive at 35.  Hemoglobin is low at 8.5 which is a significant drop.  CMP shows elevated creatinine and elevated liver functions.  Other labs are pending. Other labs are pending.  Patient is on Plavix .  I called patient and discussed lab results with her.  Patient states she has not noticed any active bleeding.  She has been experiencing increased fatigue.  I advised to contact her PCP for the evaluation of and a drop in her hemoglobin and elevated LFTs. Please forward labs to her PCP, and neurologist.

## 2024-03-16 NOTE — Telephone Encounter (Signed)
 Looks like she just had labs on 03/15/2024 -that should suffice.   Kael Keetch Aspers, DO, FACC

## 2024-03-16 NOTE — Telephone Encounter (Signed)
 Patient advised she wanted Dr. Eldonna and Duwaine to review her labs due to change in lab work and advise if it is still ok to do her injection on 04/07/24

## 2024-03-17 NOTE — Telephone Encounter (Signed)
 Okay, so is she holding statin for now? Please have her send over the labs if not in epic.   Dimitri Shakespeare [place], DO, FACC

## 2024-03-18 ENCOUNTER — Ambulatory Visit: Admitting: Rheumatology

## 2024-03-18 LAB — CBC WITH DIFFERENTIAL/PLATELET
Basophils Absolute: 0 x10E3/uL (ref 0.0–0.2)
Basos: 0 %
EOS (ABSOLUTE): 0.1 x10E3/uL (ref 0.0–0.4)
Eos: 3 %
Hematocrit: 26.4 % — ABNORMAL LOW (ref 34.0–46.6)
Hemoglobin: 8.5 g/dL — ABNORMAL LOW (ref 11.1–15.9)
Immature Grans (Abs): 0 x10E3/uL (ref 0.0–0.1)
Immature Granulocytes: 0 %
Lymphocytes Absolute: 0.7 x10E3/uL (ref 0.7–3.1)
Lymphs: 18 %
MCH: 26.6 pg (ref 26.6–33.0)
MCHC: 32.2 g/dL (ref 31.5–35.7)
MCV: 83 fL (ref 79–97)
Monocytes Absolute: 0.4 x10E3/uL (ref 0.1–0.9)
Monocytes: 9 %
Neutrophils Absolute: 2.8 x10E3/uL (ref 1.4–7.0)
Neutrophils: 70 %
Platelets: 253 x10E3/uL (ref 150–450)
RBC: 3.2 x10E6/uL — ABNORMAL LOW (ref 3.77–5.28)
RDW: 16 % — ABNORMAL HIGH (ref 11.7–15.4)
WBC: 4 x10E3/uL (ref 3.4–10.8)

## 2024-03-18 LAB — COMPREHENSIVE METABOLIC PANEL WITH GFR
ALT: 51 IU/L — ABNORMAL HIGH (ref 0–32)
AST: 84 IU/L — ABNORMAL HIGH (ref 0–40)
Albumin: 3.6 g/dL — ABNORMAL LOW (ref 3.8–4.9)
Alkaline Phosphatase: 153 IU/L — ABNORMAL HIGH (ref 49–135)
BUN/Creatinine Ratio: 18 (ref 12–28)
BUN: 20 mg/dL (ref 8–27)
Bilirubin Total: 0.5 mg/dL (ref 0.0–1.2)
CO2: 25 mmol/L (ref 20–29)
Calcium: 9 mg/dL (ref 8.7–10.3)
Chloride: 101 mmol/L (ref 96–106)
Creatinine, Ser: 1.1 mg/dL — ABNORMAL HIGH (ref 0.57–1.00)
Globulin, Total: 3.5 g/dL (ref 1.5–4.5)
Glucose: 122 mg/dL — ABNORMAL HIGH (ref 70–99)
Potassium: 3.7 mmol/L (ref 3.5–5.2)
Sodium: 138 mmol/L (ref 134–144)
Total Protein: 7.1 g/dL (ref 6.0–8.5)
eGFR: 58 mL/min/1.73 — ABNORMAL LOW (ref >59)

## 2024-03-18 LAB — C3 AND C4
Complement C3, Serum: 97 mg/dL (ref 82–167)
Complement C4, Serum: 10 mg/dL — ABNORMAL LOW (ref 12–38)

## 2024-03-18 LAB — PROTEIN ELECTROPHORESIS, SERUM, WITH REFLEX
A/G Ratio: 0.7 (ref 0.7–1.7)
Albumin ELP: 3 g/dL (ref 2.9–4.4)
Alpha 1: 0.3 g/dL (ref 0.0–0.4)
Alpha 2: 0.8 g/dL (ref 0.4–1.0)
Beta: 0.8 g/dL (ref 0.7–1.3)
Gamma Globulin: 2.1 g/dL — AB (ref 0.4–1.8)
Globulin, Total: 4.1 g/dL — AB (ref 2.2–3.9)

## 2024-03-18 LAB — PROTEIN / CREATININE RATIO, URINE
Creatinine, Urine: 133.7 mg/dL
Protein, Ur: 156 mg/dL
Protein/Creat Ratio: 1167 mg/g{creat} — AB (ref 0–200)

## 2024-03-18 LAB — RNP ANTIBODIES: ENA RNP Ab: 0.5 AI (ref 0.0–0.9)

## 2024-03-18 LAB — RHEUMATOID FACTOR: Rheumatoid fact SerPl-aCnc: 35 [IU]/mL — ABNORMAL HIGH (ref <14.0)

## 2024-03-18 LAB — SEDIMENTATION RATE: Sed Rate: 49 mm/h — ABNORMAL HIGH (ref 0–40)

## 2024-03-18 LAB — ANTI-DNA ANTIBODY, DOUBLE-STRANDED: dsDNA Ab: 4 [IU]/mL (ref 0–9)

## 2024-03-18 LAB — ANA: Anti Nuclear Antibody (ANA): POSITIVE — AB

## 2024-03-18 NOTE — Progress Notes (Signed)
 Urine protein creatinine ratio is elevated.  Creatinine is up.  I called patient and discussed all above lab results.  Please forward lab results to her PCP and please place an urgent referral to nephrology.

## 2024-03-20 ENCOUNTER — Emergency Department (HOSPITAL_BASED_OUTPATIENT_CLINIC_OR_DEPARTMENT_OTHER)

## 2024-03-20 ENCOUNTER — Emergency Department (HOSPITAL_BASED_OUTPATIENT_CLINIC_OR_DEPARTMENT_OTHER): Admission: EM | Admit: 2024-03-20 | Discharge: 2024-03-20 | Disposition: A | Discharge status: Home / Self Care

## 2024-03-20 ENCOUNTER — Emergency Department (HOSPITAL_BASED_OUTPATIENT_CLINIC_OR_DEPARTMENT_OTHER): Admitting: Radiology

## 2024-03-20 DIAGNOSIS — N3001 Acute cystitis with hematuria: Secondary | ICD-10-CM | POA: Insufficient documentation

## 2024-03-20 DIAGNOSIS — R051 Acute cough: Secondary | ICD-10-CM | POA: Insufficient documentation

## 2024-03-20 DIAGNOSIS — R109 Unspecified abdominal pain: Secondary | ICD-10-CM | POA: Diagnosis present

## 2024-03-20 DIAGNOSIS — D649 Anemia, unspecified: Secondary | ICD-10-CM | POA: Insufficient documentation

## 2024-03-20 LAB — URINALYSIS, ROUTINE W REFLEX MICROSCOPIC
Bilirubin Urine: NEGATIVE
Glucose, UA: NEGATIVE mg/dL
Ketones, ur: NEGATIVE mg/dL
Leukocytes,Ua: NEGATIVE
Nitrite: NEGATIVE
Protein, ur: 30 mg/dL — AB
RBC / HPF: >50 RBC/hpf (ref 0–5)
Specific Gravity, Urine: 1.01 (ref 1.005–1.030)
pH: 7 (ref 5.0–8.0)

## 2024-03-20 LAB — CBC WITH DIFFERENTIAL/PLATELET
Abs Immature Granulocytes: 0.07 K/uL (ref 0.00–0.07)
Basophils Absolute: 0 K/uL (ref 0.0–0.1)
Basophils Relative: 0 %
Eosinophils Absolute: 0.1 K/uL (ref 0.0–0.5)
Eosinophils Relative: 1 %
HCT: 27.6 % — ABNORMAL LOW (ref 36.0–46.0)
Hemoglobin: 10 g/dL — ABNORMAL LOW (ref 12.0–15.0)
Immature Granulocytes: 1 %
Lymphocytes Relative: 12 %
Lymphs Abs: 0.7 K/uL (ref 0.7–4.0)
MCH: 26.9 pg (ref 26.0–34.0)
MCHC: 36.2 g/dL — ABNORMAL HIGH (ref 30.0–36.0)
MCV: 74.2 fL — ABNORMAL LOW (ref 80.0–100.0)
Monocytes Absolute: 0.7 K/uL (ref 0.1–1.0)
Monocytes Relative: 12 %
Neutro Abs: 4.6 K/uL (ref 1.7–7.7)
Neutrophils Relative %: 74 %
Platelets: 325 K/uL (ref 150–400)
RBC: 3.72 MIL/uL — ABNORMAL LOW (ref 3.87–5.11)
RDW: 15.6 % — ABNORMAL HIGH (ref 11.5–15.5)
WBC: 6.3 K/uL (ref 4.0–10.5)
nRBC: 0 % (ref 0.0–0.2)

## 2024-03-20 LAB — RESP PANEL BY RT-PCR (RSV, FLU A&B, COVID)  RVPGX2
Influenza A by PCR: NEGATIVE
Influenza B by PCR: NEGATIVE
Resp Syncytial Virus by PCR: NEGATIVE
SARS Coronavirus 2 by RT PCR: NEGATIVE

## 2024-03-20 LAB — COMPREHENSIVE METABOLIC PANEL WITH GFR
ALT: 27 U/L (ref 0–44)
AST: 21 U/L (ref 15–41)
Albumin: 3.8 g/dL (ref 3.5–5.0)
Alkaline Phosphatase: 115 U/L (ref 38–126)
Anion gap: 11 (ref 5–15)
BUN: 26 mg/dL — ABNORMAL HIGH (ref 6–20)
CO2: 26 mmol/L (ref 22–32)
Calcium: 9.8 mg/dL (ref 8.9–10.3)
Chloride: 102 mmol/L (ref 98–111)
Creatinine, Ser: 1.16 mg/dL — ABNORMAL HIGH (ref 0.44–1.00)
GFR, Estimated: 54 mL/min — ABNORMAL LOW (ref >60)
Glucose, Bld: 90 mg/dL (ref 70–99)
Potassium: 3.8 mmol/L (ref 3.5–5.1)
Sodium: 139 mmol/L (ref 135–145)
Total Bilirubin: 0.6 mg/dL (ref 0.0–1.2)
Total Protein: 7.7 g/dL (ref 6.5–8.1)

## 2024-03-20 LAB — LACTIC ACID, PLASMA: Lactic Acid, Venous: 1 mmol/L (ref 0.5–1.9)

## 2024-03-20 LAB — LIPASE, BLOOD: Lipase: 54 U/L — ABNORMAL HIGH (ref 11–51)

## 2024-03-20 MED ORDER — CEPHALEXIN 250 MG PO CAPS
500.0000 mg | ORAL_CAPSULE | Freq: Once | ORAL | Status: AC
  Administered 2024-03-20: 500 mg via ORAL
  Filled 2024-03-20: qty 2

## 2024-03-20 MED ORDER — FLUCONAZOLE 150 MG PO TABS
150.0000 mg | ORAL_TABLET | Freq: Every day | ORAL | 0 refills | Status: DC | PRN
Start: 2024-03-20 — End: 2024-04-14

## 2024-03-20 MED ORDER — PROMETHAZINE-DM 6.25-15 MG/5ML PO SYRP: 5.0000 mL | ORAL_SOLUTION | Freq: Four times a day (QID) | ORAL | 0 refills | Status: DC | PRN

## 2024-03-20 MED ORDER — IPRATROPIUM-ALBUTEROL 0.5-2.5 (3) MG/3ML IN SOLN
3.0000 mL | Freq: Once | RESPIRATORY_TRACT | Status: AC
  Administered 2024-03-20: 3 mL via RESPIRATORY_TRACT
  Filled 2024-03-20: qty 3

## 2024-03-20 MED ORDER — FENTANYL CITRATE PF 50 MCG/ML IJ SOSY
50.0000 ug | PREFILLED_SYRINGE | Freq: Once | INTRAMUSCULAR | Status: AC
  Administered 2024-03-20: 50 ug via INTRAVENOUS
  Filled 2024-03-20: qty 1

## 2024-03-20 MED ORDER — BENZONATATE 100 MG PO CAPS: 100.0000 mg | ORAL_CAPSULE | Freq: Three times a day (TID) | ORAL | 0 refills | Status: DC

## 2024-03-20 MED ORDER — ONDANSETRON HCL 4 MG/2ML IJ SOLN
4.0000 mg | Freq: Once | INTRAMUSCULAR | Status: AC
  Administered 2024-03-20: 4 mg via INTRAVENOUS
  Filled 2024-03-20: qty 2

## 2024-03-20 MED ORDER — CEPHALEXIN 500 MG PO CAPS
500.0000 mg | ORAL_CAPSULE | Freq: Two times a day (BID) | ORAL | 0 refills | Status: DC
Start: 2024-03-20 — End: 2024-04-14

## 2024-03-20 NOTE — ED Provider Notes (Signed)
 [place] EMERGENCY DEPARTMENT AT [facility] Provider Note   CSN: [id number] Arrival date & time: 03/20/24  9143     Patient presents with: Abdominal Pain   Evelyn Baker is a 63 y.o. female.   Patient on Imuran , followed by rheumatology for mixed connective tissue disease and osteoarthritis, on Plavix  --presents to the emergency department today for evaluation of cough and abdominal pain.  Symptoms started on around September 8.  Patient was on a cruise in the [place].  She flew home shortly afterwards.  Patient reports persistent dry productive cough.  She states if she was seen by [facility] clinic, prescribed doxycycline  and prednisone  which she has taken and finished.  Cough is persisting.  Nonproductive, no fevers currently.  About 3 to 4 days ago she developed a stabbing, gripeing pain in the mid abdomen.  She describes it as annoying.  It does not radiate.  It is worse when she coughs.  She had 1 episode of vomiting occurring about 6 days ago, but no other vomiting.  She has had some small hard stools but does not feel like she has a full stool.  No blood in the stool or black stools.  Patient denies history of abdominal surgeries.  Patient had labs that were drawn on 03/15/2024.  These were ordered by rheumatology.  Of note, creatinine was mildly elevated as well as liver function testing.  Her hemoglobin was noted to have dropped.  She was referred to her PCP and rheumatology had placed an  urgent nephrology referral.  Told to hold her statin.       Prior to Admission medications   Medication Sig Start Date End Date Taking? Authorizing Provider  albuterol  (VENTOLIN  HFA) 108 (90 Base) MCG/ACT inhaler Inhale 2 puffs into the lungs every 4 (four) hours as needed for wheezing or shortness of breath. 01/22/21   Bethanie Cough, PA-C  azathioprine  (IMURAN ) 100 MG tablet Take 1 tablet (100 mg total) by mouth daily as directed 12/21/23   Cheryl Waddell HERO, PA-C   budesonide -formoterol  (SYMBICORT ) 80-4.5 MCG/ACT inhaler Inhale 2 puffs into the lungs in the morning and at bedtime. 08/12/23   Geronimo Amel, MD  chlorthalidone  (HYGROTON ) 25 MG tablet Take 1 tablet (25 mg total) by mouth daily. 01/14/24   Tolia, Sunit, DO  Cholecalciferol (VITAMIN D-3 PO) Take by mouth.    [provider]  clopidogrel  (PLAVIX ) 75 MG tablet Take 1 tablet (75 mg total) by mouth daily. 01/20/24 01/19/25  Sethi, Pramod S, MD  Cyanocobalamin (VITAMIN B-12 PO) Take by mouth.    [provider]  doxycycline  (VIBRA -TABS) 100 MG tablet Take 1 tablet (100 mg total) by mouth 2 (two) times daily with food for 7 days 11/12/23     doxycycline  (VIBRA -TABS) 100 MG tablet Take 1 tablet (100 mg total) by mouth 2 (two) times daily for 10 days. 03/15/24 03/25/24    EPINEPHrine  0.3 mg/0.3 mL IJ SOAJ injection Inject 0.3 mg into the muscle as needed for anaphylaxis.    [provider]  EPINEPHrine  0.3 mg/0.3 mL IJ SOAJ injection Inject 0.3 mg into the muscle as needed as directed. 02/11/24     gabapentin  (NEURONTIN ) 100 MG capsule Take 1 capsule (100 mg total) by mouth at bedtime. 12/14/23     levothyroxine  (SYNTHROID ) 112 MCG tablet Take 1 tablet (112 mcg total) by mouth in the morning on an empty stomach. 11/09/23     levothyroxine  (SYNTHROID ) 112 MCG tablet Take 1 tablet (112 mcg total) by  mouth in the morning on an empty stomach for 30 days. 11/09/23     mupirocin  ointment (BACTROBAN ) 2 % Apply topically 2 (two) times daily for 14 days Patient not taking: Reported on 03/03/2024 11/12/23     potassium chloride  (MICRO-K ) 10 MEQ CR capsule Take 1 capsule (10 mEq total) by mouth 2 (two) times daily. 10/12/23   Tolia, Sunit, DO  predniSONE  (DELTASONE ) 20 MG tablet Take 1 tablet (20 mg total) by mouth 2 (two) times daily. 03/15/24     rosuvastatin  (CRESTOR ) 20 MG tablet Take 1 tablet (20 mg total) by mouth daily. 07/29/23   Tolia, Sunit, DO  spironolactone  (ALDACTONE ) 25 MG tablet Take 1  tablet (25 mg total) by mouth daily. Patient not taking: Reported on 03/03/2024 10/12/23   Michele, Sunit, DO  valsartan  (DIOVAN ) 160 MG tablet Take 1 tablet (160 mg total) by mouth daily. 02/17/24   Tolia, Sunit, DO    Allergies: Shellfish allergy, Avocado, Codeine , Iodinated contrast media, Pineapple, and Penicillins    Review of Systems  Updated Vital Signs BP (!) 182/99 (BP Location: Right Arm)   Pulse 89   Temp 99.2 F (37.3 C) (Oral)   Resp 17   SpO2 100%   Physical Exam Vitals and nursing note reviewed.  Constitutional:      General: She is not in acute distress.    Appearance: She is well-developed.  HENT:     Head: Normocephalic and atraumatic.     Right Ear: External ear normal.     Left Ear: External ear normal.     Nose: Nose normal.  Eyes:     Conjunctiva/sclera: Conjunctivae normal.  Cardiovascular:     Rate and Rhythm: Normal rate and regular rhythm.     Heart sounds: No murmur heard. Pulmonary:     Effort: No respiratory distress.     Breath sounds: No wheezing, rhonchi or rales.     Comments: Occasional dry cough during exam, no significant wheezing or crackles noted. Abdominal:     Palpations: Abdomen is soft.     Tenderness: There is abdominal tenderness in the epigastric area and periumbilical area. There is no guarding or rebound.  Musculoskeletal:     Cervical back: Normal range of motion and neck supple.     Right lower leg: No edema.     Left lower leg: No edema.  Skin:    General: Skin is warm and dry.     Findings: No rash.  Neurological:     General: No focal deficit present.     Mental Status: She is alert. Mental status is at baseline.     Motor: No weakness.  Psychiatric:        Mood and Affect: Mood normal.     (all labs ordered are listed, but only abnormal results are displayed) Labs Reviewed  CBC WITH DIFFERENTIAL/PLATELET - Abnormal; Notable for the following components:      Result Value   RBC 3.72 (*)    Hemoglobin 10.0 (*)     HCT 27.6 (*)    MCV 74.2 (*)    MCHC 36.2 (*)    RDW 15.6 (*)    All other components within normal limits  COMPREHENSIVE METABOLIC PANEL WITH GFR - Abnormal; Notable for the following components:   BUN 26 (*)    Creatinine, Ser 1.16 (*)    GFR, Estimated 54 (*)    All other components within normal limits  LIPASE, BLOOD - Abnormal; Notable for the following  components:   Lipase 54 (*)    All other components within normal limits  URINALYSIS, ROUTINE W REFLEX MICROSCOPIC - Abnormal; Notable for the following components:   Color, Urine COLORLESS (*)    APPearance HAZY (*)    Hgb urine dipstick LARGE (*)    Protein, ur 30 (*)    Bacteria, UA RARE (*)    All other components within normal limits  RESP PANEL BY RT-PCR (RSV, FLU A&B, COVID)  RVPGX2  URINE CULTURE  LACTIC ACID, PLASMA    EKG: None  Radiology: CT ABDOMEN PELVIS WO CONTRAST Result Date: 03/20/2024 CLINICAL DATA:  Lower abdominal pain. EXAM: CT ABDOMEN AND PELVIS WITHOUT CONTRAST TECHNIQUE: Multidetector CT imaging of the abdomen and pelvis was performed following the standard protocol without IV contrast. RADIATION DOSE REDUCTION: This exam was performed according to the departmental dose-optimization program which includes automated exposure control, adjustment of the mA and/or kV according to patient size and/or use of iterative reconstruction technique. COMPARISON:  08/07/2022 FINDINGS: Lower chest: Heart is normal size.  Lung bases are clear. Hepatobiliary: Subtle differential density within the gallbladder which may represent sludge. Liver and biliary tree otherwise unremarkable. Pancreas: Normal. Spleen: Normal. Adrenals/Urinary Tract: Adrenal glands are normal. Kidneys are normal in size without hydronephrosis or nephrolithiasis. Ureters and bladder are normal. Stomach/Bowel: Stomach and small bowel are normal. Appendix is normal. Mild diverticulosis of the colon which is only seen over the sigmoid colon. Mild  decompression of the descending and sigmoid colon. Vascular/Lymphatic: Abdominal aorta is normal in caliber. Remaining vascular structures are unremarkable. No significant adenopathy. Reproductive: Status post hysterectomy. No adnexal masses. Other: No focal inflammatory change or free peritoneal fluid. Musculoskeletal: No focal abnormality IMPRESSION: 1. No acute findings in the abdomen/pelvis. 2. Mild diverticulosis of the sigmoid colon. 3. Subtle differential density within the gallbladder which may represent sludge. Electronically Signed   By: Toribio Agreste M.D.   On: 03/20/2024 11:10   DG Chest 2 View Result Date: 03/20/2024 CLINICAL DATA:  2 week history of cough. EXAM: CHEST - 2 VIEW COMPARISON:  03/15/2024 FINDINGS: The lungs are clear without focal pneumonia, edema, pneumothorax or pleural effusion. The cardiopericardial silhouette is within normal limits for size. No acute bony abnormality. IMPRESSION: No active cardiopulmonary disease. Electronically Signed   By: Camellia Candle M.D.   On: 03/20/2024 09:55     Procedures   Medications Ordered in the ED - No data to display  ED Course  Patient seen and examined. History obtained directly from patient.   Labs/EKG: Ordered CBC, CMP, lipase, UA, lactate, respiratory panel.  Imaging: Ordered chest x-ray.  Medications/Fluids: Ordered: None ordered.   Most recent vital signs reviewed and are as follows: BP (!) 182/99 (BP Location: Right Arm)   Pulse 89   Temp 99.2 F (37.3 C) (Oral)   Resp 17   SpO2 100%   Initial impression: This is a patient with a complex medical history on immunocompromising medications due to underlying rheumatology condition.  She has had ongoing cough with now abdominal pain.  Will need to reassess lab work as her creatinine, liver function testing recently were above baseline and hemoglobin was below baseline.  She may require abdominal imaging.  Will check chest x-ray and COVID testing.  Awaiting initial  results.  10:47 AM Reassessment performed. Patient appears stable.  Still complaining of some abdominal pain but declines medication.  Labs personally reviewed and interpreted including: CBC shows normal white blood cell count of 6.3, slight improvement in  hemoglobin to 10.0 currently; CMP with mild elevation in creatinine at 1.16 with a BUN of 26 otherwise LFTs have returned to normal; lipase minimally elevated at 54; lactate normal at 1.0; viral panel negative.  UA with hematuria and 21-50 white blood cells, rare bacteria, clean-catch.   Imaging personally visualized and interpreted including: CT abdomen and pelvis ordered without contrast due to listed IV contrast allergy.  Reviewed pertinent lab work and imaging with patient at bedside. Questions answered.   Most current vital signs reviewed and are as follows: BP (!) 182/99 (BP Location: Right Arm)   Pulse 89   Temp 99.2 F (37.3 C) (Oral)   Resp 17   SpO2 100%   Plan: Follow-up CT results.  Overall renal function is only mildly elevated and hemoglobin and LFTs have been trending towards improvement, will need to be monitored.  12:43 PM Reassessment performed. Patient appears improved.  Pain better controlled after a small dose of fentanyl .  Imaging personally visualized and interpreted including: CT, report notes gallbladder sludge, otherwise no acute findings.  Reviewed pertinent lab work and imaging with patient at bedside. Questions answered.   Most current vital signs reviewed and are as follows: BP (!) 169/97 (BP Location: Right Arm)   Pulse 75   Temp 99.2 F (37.3 C) (Oral)   Resp 18   SpO2 99%   Plan: Discharge to home.  Will give first dose of Keflex  here to treat UTI.  Prescriptions written for: Keflex , promethazine  cough syrup/Tessalon  for cough.  Other home care instructions discussed: Close monitoring of symptoms, rest and hydration  ED return instructions discussed: The patient was urged to return to the  Emergency Department immediately with worsening of current symptoms, worsening abdominal pain, persistent vomiting, blood noted in stools, fever, or any other concerns.  We also discussed return with worsening shortness of breath or trouble breathing.  Family members at bedside during discussion.  Follow-up instructions discussed: Patient encouraged to follow-up with their PCP in 3 days.                                     Medical Decision Making Amount and/or Complexity of Data Reviewed Labs: ordered. Radiology: ordered.  Risk Prescription drug management.   Patient with ongoing cough after a recent cruise.  Negative COVID and flu.  Chest x-ray today without signs of pneumonia.  No hypoxia.  This could be residual cough from bronchitis or viral infection of the lower respiratory tract.  Cough is bothersome but patient appears well, no distress.  In regards to her abdominal pain, overall lab work is reassuring.  Normal white blood cell count.  CT is reassuring.  UA does show red and white blood cells.  Given lack of other obvious cause, will treat for UTI.  Culture sent.  Do not suspect pyelonephritis at this time.  In regards to recent abnormal blood work, liver function has recovered.  Her creatinine remains minimally elevated from baseline and her hemoglobin is improved but still low.  She denies blood in the stool and is already on iron.  This can be monitored as outpatient.  Do not suspect significant GI bleeding at this time.  The patient's vital signs, pertinent lab work and imaging were reviewed and interpreted as discussed in the ED course. Hospitalization was considered for further testing, treatments, or serial exams/observation. However as patient is well-appearing, has a stable exam, and reassuring studies  today, I do not feel that they warrant admission at this time. This plan was discussed with the patient who verbalizes agreement and comfort with this plan and seems reliable  and able to return to the Emergency Department with worsening or changing symptoms.       Final diagnoses:  Acute cystitis with hematuria  Anemia, unspecified type  Acute cough    ED Discharge Orders          Ordered    cephALEXin  (KEFLEX ) 500 MG capsule  2 times daily        03/20/24 1237    benzonatate  (TESSALON ) 100 MG capsule  Every 8 hours        03/20/24 1237    promethazine -dextromethorphan (PROMETHAZINE -DM) 6.25-15 MG/5ML syrup  4 times daily PRN        03/20/24 1237               Desiderio Chew, PA-C 03/20/24 1246    Simon Lavonia SAILOR, MD 03/20/24 [phone]

## 2024-03-20 NOTE — ED Notes (Signed)
 Pt aware of the need for a urine... Pt currently unable to provide the sample.SABRASABRA

## 2024-03-20 NOTE — ED Triage Notes (Addendum)
 Pt reports cough for 1 week - seen by PMD and treated with doxycycline  and prednisone  without relief.  Pt this morning reports diffuse abdominal pain that began yesterday and inability to move bowels for 2-3 days.  Pt stopped taking crestor  2 days ago due to elevated kidney and LFTs  Pt denies n/v at present. NAD, AAO4

## 2024-03-20 NOTE — Discharge Instructions (Signed)
 In regards to your cough today: Your chest x-ray was clear without signs of pneumonia, COVID and flu testing were negative.  I have prescribed Tessalon  and promethazine  cough syrup to try to help control your symptoms.  This may be due to a viral infection, but we need follow-up if not getting better.  Please have your upcoming CT scan as scheduled if able.  In regards to the abdominal pain: Overall your lab work is reassuring.  You have normal white blood cell count.  The urine does show signs of red blood cells and white blood cells, which could indicate an infection.  The CT scan showed some gallbladder sludge, but no acute findings which would explain your abdominal pain.  No signs of appendicitis or diverticulitis.  We are going to treat you with an antibiotic called Keflex .  I have sent a urine culture that your doctor can follow-up on.  In regards to your recent abnormal labs, your hemoglobin was slightly better today at 10.0 but still low.  Your liver function testing have returned to normal.  And your renal function remains just mildly elevated from your baseline at around 1.1.  This will need to be followed up as an outpatient.  Return to the emergency department with any worsening symptoms including fevers, vomiting, worsening or severe abdominal pain, difficulty breathing or shortness of breath.  Please follow-up with your doctor in 3 days for recheck of your symptoms.  If you have prolonged bleeding in the urine, you may need a referral to urologist, but checking for and treating an infection would be the first step.

## 2024-03-20 NOTE — ED Notes (Addendum)
 Pt alert and oriented X 4 at the time of discharge. RR even and unlabored. No acute distress noted. Pt verbalized understanding of discharge instructions as discussed. Pt transferred to lobby in wheelchair at time of discharge.

## 2024-03-21 LAB — URINE CULTURE: Culture: <10000 — AB

## 2024-03-22 ENCOUNTER — Ambulatory Visit (HOSPITAL_BASED_OUTPATIENT_CLINIC_OR_DEPARTMENT_OTHER)
Admission: RE | Admit: 2024-03-22 | Discharge: 2024-03-22 | Disposition: A | Discharge status: Home / Self Care | Source: Ambulatory Visit | Attending: Internal Medicine | Admitting: Internal Medicine

## 2024-03-22 ENCOUNTER — Encounter: Payer: Self-pay | Admitting: Cardiology

## 2024-03-22 DIAGNOSIS — R918 Other nonspecific abnormal finding of lung field: Secondary | ICD-10-CM | POA: Insufficient documentation

## 2024-03-22 DIAGNOSIS — R911 Solitary pulmonary nodule: Secondary | ICD-10-CM | POA: Diagnosis present

## 2024-03-22 NOTE — Telephone Encounter (Signed)
 Patient was scheduled on 03/22/2024 at the [facility] and has arrived to completed.  Nothing further at this time.

## 2024-03-23 ENCOUNTER — Other Ambulatory Visit (HOSPITAL_COMMUNITY): Payer: Self-pay

## 2024-03-23 MED ORDER — FAMOTIDINE 20 MG PO TABS
20.0000 mg | ORAL_TABLET | Freq: Every day | ORAL | 1 refills | Status: DC
  Filled 2024-03-23: qty 30, 30d supply, fill #0

## 2024-03-24 ENCOUNTER — Encounter: Admitting: Physical Medicine and Rehabilitation

## 2024-03-24 ENCOUNTER — Other Ambulatory Visit (HOSPITAL_COMMUNITY): Payer: Self-pay | Admitting: Family Medicine

## 2024-03-24 DIAGNOSIS — R109 Unspecified abdominal pain: Secondary | ICD-10-CM

## 2024-03-27 NOTE — Telephone Encounter (Signed)
 Can she go back on chlorthalidone  until her appointment with Dr. Michele on Oct 8?  Looks like she didn't tolerate spironolactone .

## 2024-03-30 ENCOUNTER — Ambulatory Visit (HOSPITAL_COMMUNITY): Admission: RE | Admit: 2024-03-30

## 2024-03-30 ENCOUNTER — Encounter (HOSPITAL_COMMUNITY): Payer: Self-pay

## 2024-03-30 NOTE — Telephone Encounter (Signed)
 Review of her medications both Imuran  and statin therapy and because elevated alkaline phosphatase. Decision to continue Imuran  will be left to her rheumatologist. We can hold the rosuvastatin  for now, was prescribed given her coronary artery calcification. We can discuss this further at the next office visit. Would recommend that she also discusses with her PCP for other causes of elevated alkaline phosphatase levels.  Dr. Adriel Desrosier

## 2024-04-01 ENCOUNTER — Encounter: Payer: Self-pay | Admitting: Cardiology

## 2024-04-03 ENCOUNTER — Ambulatory Visit: Payer: Self-pay | Admitting: Internal Medicine

## 2024-04-03 NOTE — Progress Notes (Signed)
 Arm pit lung nodes are smaller. Lungs s are clear. Good news overal. See you in oct visit

## 2024-04-04 ENCOUNTER — Other Ambulatory Visit: Payer: Self-pay

## 2024-04-04 ENCOUNTER — Other Ambulatory Visit (HOSPITAL_COMMUNITY): Payer: Self-pay

## 2024-04-04 DIAGNOSIS — E876 Hypokalemia: Secondary | ICD-10-CM

## 2024-04-04 MED ORDER — POTASSIUM CHLORIDE ER 10 MEQ PO CPCR
10.0000 meq | ORAL_CAPSULE | Freq: Two times a day (BID) | ORAL | 2 refills | Status: AC
  Filled 2024-04-04: qty 180, 90d supply, fill #0

## 2024-04-04 NOTE — Telephone Encounter (Signed)
 Refill sent to pharmacy.

## 2024-04-06 ENCOUNTER — Other Ambulatory Visit (HOSPITAL_COMMUNITY): Payer: Self-pay

## 2024-04-06 ENCOUNTER — Encounter: Payer: Self-pay | Admitting: Cardiology

## 2024-04-06 ENCOUNTER — Ambulatory Visit: Attending: Cardiology | Admitting: Cardiology

## 2024-04-06 VITALS — BP 142/79 | HR 99 | Ht 64.0 in | Wt 192.0 lb

## 2024-04-06 DIAGNOSIS — I2584 Coronary atherosclerosis due to calcified coronary lesion: Secondary | ICD-10-CM | POA: Insufficient documentation

## 2024-04-06 DIAGNOSIS — I10 Essential (primary) hypertension: Secondary | ICD-10-CM | POA: Diagnosis not present

## 2024-04-06 DIAGNOSIS — I6381 Other cerebral infarction due to occlusion or stenosis of small artery: Secondary | ICD-10-CM | POA: Diagnosis present

## 2024-04-06 DIAGNOSIS — E876 Hypokalemia: Secondary | ICD-10-CM | POA: Insufficient documentation

## 2024-04-06 DIAGNOSIS — I700 Atherosclerosis of aorta: Secondary | ICD-10-CM | POA: Insufficient documentation

## 2024-04-06 DIAGNOSIS — E782 Mixed hyperlipidemia: Secondary | ICD-10-CM | POA: Insufficient documentation

## 2024-04-06 DIAGNOSIS — I2510 Atherosclerotic heart disease of native coronary artery without angina pectoris: Secondary | ICD-10-CM | POA: Insufficient documentation

## 2024-04-06 DIAGNOSIS — Z8673 Personal history of transient ischemic attack (TIA), and cerebral infarction without residual deficits: Secondary | ICD-10-CM | POA: Diagnosis present

## 2024-04-06 MED ORDER — ROSUVASTATIN CALCIUM 10 MG PO TABS
10.0000 mg | ORAL_TABLET | Freq: Every day | ORAL | 3 refills | Status: AC
  Filled 2024-04-06: qty 90, 90d supply, fill #0
  Filled 2024-07-17: qty 90, 90d supply, fill #1
  Filled 2024-07-21: qty 90, 90d supply, fill #2

## 2024-04-06 MED ORDER — VALSARTAN 160 MG PO TABS
160.0000 mg | ORAL_TABLET | Freq: Two times a day (BID) | ORAL | 3 refills | Status: DC
  Filled 2024-04-06 – 2024-04-25 (×3): qty 180, 90d supply, fill #0

## 2024-04-06 NOTE — Patient Instructions (Signed)
 Medication Instructions:  Your physician has recommended you make the following change in your medication:   STOP Spironolactone   INCREASE the Valsartan  160 to twice a day  REDUCE the Rosuvastatin  to 10 mg taking 1 daily  *If you need a refill on your cardiac medications before your next appointment, please call your pharmacy*  Lab Work: TODAY:  BMET  6 WEEKS, COME TO THE LAB, FASTING, FOR:  CMET & LIPID  If you have labs (blood work) drawn today and your tests are completely normal, you will receive your results only by: MyChart Message (if you have MyChart) OR A paper copy in the mail If you have any lab test that is abnormal or we need to change your treatment, we will call you to review the results.  Testing/Procedures: None ordered  Follow-Up: At [facility], you and your health needs are our priority.  As part of our continuing mission to provide you with exceptional heart care, our providers are all part of one team.  This team includes your primary Cardiologist (physician) and Advanced Practice Providers or APPs (Physician Assistants and Nurse Practitioners) who all work together to provide you with the care you need, when you need it.  Your next appointment:   6 month(s)  Provider:   Madonna Large, DO    We recommend signing up for the patient portal called MyChart.  Sign up information is provided on this After Visit Summary.  MyChart is used to connect with patients for Virtual Visits (Telemedicine).  Patients are able to view lab/test results, encounter notes, upcoming appointments, etc.  Non-urgent messages can be sent to your provider as well.   To learn more about what you can do with MyChart, go to [email or web address].   Other Instructions

## 2024-04-06 NOTE — Progress Notes (Signed)
 Cardiology Office Note:  .   Date:  04/06/2024  ID:  Evelyn Baker, DOB 08-13-1960, MRN [id number] PCP:  Katina Pfeiffer, PA-C  Former Cardiology Providers: None  [facility] Providers Cardiologist:  Madonna Large, DO , [facility] (established care 02/12/2023) Electrophysiologist:  None  Click to update primary MD,subspecialty MD or APP then REFRESH:1}    Chief Complaint  Patient presents with   Coronary atherosclerosis due to calcified coronary lesion   Follow-up    History of Present Illness: .   Evelyn Baker is a 63 y.o. African-American female whose past medical history and cardiovascular risk factors includes: Recent TIA, lacunar stroke (MRI of the brain July 2024), hypothyroidism, hypertension, GERD, rheumatoid arthritis, coronary artery calcification noted on nongated CT study, aortic atherosclerosis.   Patient was referred to the practice given the degree of coronary artery calcification and recent TIA.  On her prior CT of the chest and was noted to have both pulmonary and cardiac findings.  She had coronary artery and aortic atherosclerosis on nongated CT study and referred to cardiology for further evaluation management.  In the interim she develops stroke symptoms and was diagnosed with TIA.  During the stroke workup she had an MRI of the brain which noted a right internal capsule lacunar infarct.  Neurology recommended echocardiogram and cardiac monitor for further evaluation. Her triamterene /hydrochlorothiazide  was discontinued and transition to chlorthalidone  and valsartan .  Given her coronary artery calcification and aortic atherosclerosis her statin therapy was slowly trended up to Crestor  20 mg p.o. daily.  In addition, given her hypertension and hypokalemia had recommended discontinuing chlorthalidone  and starting spironolactone  as it is a potassium sparing diuretic.  However, in the interim patient had called back stating that she was not tolerating spironolactone   well and went back to chlorthalidone .  She also had concerns with regards to her alkaline phosphatase and she was informed by her rheumatologist that statins can cause it.  Of note, she is also on Imuran .  She presents today for follow-up.    Patient is accompanied by her husband at today's office visit. Denies anginal chest pain or heart failure symptoms. Stopped spironolactone  secondary to abdominal cramps. Currently on chlorthalidone  and potassium supplementation as well as valsartan . Home blood pressure log reviewed, SBP ranging between 130-140 mmHg. Patient states that she was referred to nephrology given her urine proteinuria. Patient's alkaline phosphatase has remained within normal limits in the past, with the exception of 03/15/2024.  Follow-up repeat labs on 03/20/2024 levels have normalized.  Patient has stopped taking Crestor  20 mg p.o. nightly.   Review of Systems: .   Review of Systems  Cardiovascular:  Negative for chest pain, claudication, dyspnea on exertion, irregular heartbeat, leg swelling, near-syncope, orthopnea, palpitations, paroxysmal nocturnal dyspnea and syncope.  Respiratory:  Negative for shortness of breath.   Hematologic/Lymphatic: Negative for bleeding problem.  Musculoskeletal:  Negative for muscle cramps and myalgias.  Neurological:  Negative for dizziness and light-headedness.    Studies Reviewed:    Echocardiogram: 03/17/2023: LVEF 55 to 60%, grade 1 diastolic dysfunction, right ventricular size and function normal, trivial MR, estimated RAP 8 mmHg, agitated saline contrast negative for interatrial shunt  Stress Testing: Exercise Sestamibi stress test 03/05/2023: Exercise nuclear stress test was performed using Bruce protocol.  1 Day Rest and Stress images. Exercise time 4 minutes 30 seconds on Bruce protocol, achieved 6.53 METS, 99 % of APMHR. Stress ECG Equivocal for ischemia (peak stress ECG uninterpretable due to motion, however, 1 minute into  recovery ST-T changes does not illustrate ischemia).  Normal myocardial perfusion with breast attenuation artifact.  No obvious evidence of reversible myocardial ischemia or prior infarct. Left ventricular size normal, wall thickness preserved, no obvious regional wall motion abnormalities.  Low risk study.   Cardiac monitor (Zio Patch): 02/17/2023 - 03/03/2023 Dominant rhythm sinus, followed by tachycardia (burden 16%). Heart rate 48-167 bpm.  Avg HR 85 bpm. No atrial fibrillation detected during the monitoring period. No supraventricular tachycardia,  high grade AV block, pauses (3 seconds or longer). One asymptomatic episode of NSVT, 02/26/2023, at 4:56 AM, 8 beats, 3.3 seconds, max heart rate 167 bpm, average HR 154 bpm. Total supraventricular ectopic burden <1%. Total ventricular ectopic burden <1%. Patient triggered events: 0.  RADIOLOGY: CT chest without contrast 09/08/2022 1. 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 Further clinical evaluation is recommended. Strictly speaking, interstitial lung disease is not entirely excluded, but not favored on the basis of today's examination. Repeat high-resolution chest CT should be considered in 12 months to assess for temporal changes in the appearance of the lung parenchyma. 2. Aortic atherosclerosis, in addition to two-vessel coronary artery disease. Please note that although the presence of coronary artery calcium  documents the presence of coronary artery disease, the severity of this disease and any potential stenosis cannot be assessed on this non-gated CT examination. Assessment for potential risk factor modification, dietary therapy or pharmacologic therapy may be warranted, if clinically indicated. 3. Trace volume of pericardial fluid and/or thickening, unlikely of hemodynamic significance at this time.   Aortic  Atherosclerosis (ICD10-I70.0).   MRI of the brain without contrast 01/22/2023: 1. 12 mm acute/subacute nonhemorrhagic infarct involving the posterior body of the right caudate and corona radiata. 2. Otherwise normal MRI appearance the brain. 3. Bilateral mastoid effusions. No obstructing nasopharyngeal lesion is present.  Risk Assessment/Calculations:   NA   Labs:       Latest Ref Rng & Units 03/20/2024    9:54 AM 03/15/2024    1:17 PM 09/30/2023    1:44 PM  CBC  WBC 4.0 - 10.5 K/uL 6.3  4.0  4.1   Hemoglobin 12.0 - 15.0 g/dL 89.9  8.5  87.8   Hematocrit 36.0 - 46.0 % 27.6  26.4  36.8   Platelets 150 - 400 K/uL 325  253  247        Latest Ref Rng & Units 03/20/2024    9:54 AM 03/15/2024    1:17 PM 11/06/2023    1:30 PM  BMP  Glucose 70 - 99 mg/dL 90  877  868   BUN 6 - 20 mg/dL 26  20  21    Creatinine 0.44 - 1.00 mg/dL 8.83  8.89  9.00   BUN/Creat Ratio 12 - 28  18  21    Sodium 135 - 145 mmol/L 139  138  140   Potassium 3.5 - 5.1 mmol/L 3.8  3.7  4.0   Chloride 98 - 111 mmol/L 102  101  100   CO2 22 - 32 mmol/L 26  25  25    Calcium  8.9 - 10.3 mg/dL 9.8  9.0  9.9       Latest Ref Rng & Units 03/20/2024    9:54 AM 03/15/2024    1:17 PM 11/06/2023    1:30 PM  CMP  Glucose 70 - 99 mg/dL 90  877  868   BUN 6 - 20 mg/dL 26  20  21   Creatinine 0.44 - 1.00 mg/dL 8.83  8.89  9.00   Sodium 135 - 145 mmol/L 139  138  140   Potassium 3.5 - 5.1 mmol/L 3.8  3.7  4.0   Chloride 98 - 111 mmol/L 102  101  100   CO2 22 - 32 mmol/L 26  25  25    Calcium  8.9 - 10.3 mg/dL 9.8  9.0  9.9   Total Protein 6.5 - 8.1 g/dL 7.7  7.1    Total Bilirubin 0.0 - 1.2 mg/dL 0.6  0.5    Alkaline Phos 38 - 126 U/L 115  153    AST 15 - 41 U/L 21  84    ALT 0 - 44 U/L 27  51      Lab Results  Component Value Date   CHOL 147 10/23/2023   HDL 53 10/23/2023   LDLCALC 80 10/23/2023   LDLDIRECT 92 04/21/2023   TRIG 68 10/23/2023   CHOLHDL 2.8 10/23/2023   No results for input(s): LIPOA in the last  8760 hours. No components found for: NTPROBNP No results for input(s): PROBNP in the last 8760 hours. No results for input(s): TSH in the last 8760 hours.    Physical Exam:    Today's Vitals   04/06/24 1303  BP: (!) 142/79  Pulse: 99  SpO2: 97%  Weight: 192 lb (87.1 kg)  Height: 5' 4 (1.626 m)  PainSc: 0-No pain   Body mass index is 32.96 kg/m. Wt Readings from Last 3 Encounters:  04/06/24 192 lb (87.1 kg)  03/03/24 197 lb 3.2 oz (89.4 kg)  10/12/23 203 lb 3.2 oz (92.2 kg)    Physical Exam  Constitutional: No distress.  hemodynamically stable  Neck: No JVD present.  Cardiovascular: Normal rate, regular rhythm, S1 normal, S2 normal, intact distal pulses and normal pulses. Exam reveals no gallop, no S3 and no S4.  No murmur heard. Pulmonary/Chest: Effort normal and breath sounds normal. No stridor. She has no wheezes. She has no rales.  Abdominal: Soft. Bowel sounds are normal. She exhibits no distension. There is no abdominal tenderness.  Musculoskeletal:        General: No edema.     Cervical back: Neck supple.  Neurological: She is alert and oriented to person, place, and time. She has intact cranial nerves (2-12).  Skin: Skin is warm and moist.    Impression & Recommendation(s):  Impression:   ICD-10-CM   1. Coronary atherosclerosis due to calcified coronary lesion  I25.10 rosuvastatin  (CRESTOR ) 10 MG tablet   I25.84 Comp Met (CMET)    Lipid Profile    Lipid Profile    Comp Met (CMET)    2. Aortic atherosclerosis  I70.0 rosuvastatin  (CRESTOR ) 10 MG tablet    Comp Met (CMET)    Lipid Profile    Lipid Profile    Comp Met (CMET)    3. Primary hypertension  I10 Basic Metabolic Panel (BMET)    valsartan  (DIOVAN ) 160 MG tablet    4. Hypokalemia  E87.6     5. Hx-TIA (transient ischemic attack)  Z86.73 rosuvastatin  (CRESTOR ) 10 MG tablet    Comp Met (CMET)    Lipid Profile    Lipid Profile    Comp Met (CMET)    6. Lacunar infarction (HCC)  I63.81      7. Mixed hyperlipidemia  E78.2         Recommendation(s):  Coronary atherosclerosis due to calcified coronary lesion Aortic atherosclerosis (HCC)  Denies anginal chest pain or heart failure symptoms. Noted to have coronary artery calcification on a nongated CT study. Currently on antiplatelet therapy. Given her CAC, aortic atherosclerosis, history of stroke recommend lipid-lowering agents. She was on Crestor  20 mg p.o. daily, will reduce it to Crestor  10 mg p.o. daily with follow-up lipids and CMP in 6 weeks. Echocardiogram: preserved LVEF without any significant valvular heart disease and bubble study negative Stress test-low risk Reemphasized the importance of secondary prevention with focus on improving her modifiable cardiovascular risk factors such as glycemic control, lipid management, blood pressure control, weight loss.  Primary hypertension Hypokalemia Home blood pressure ranges between 130-140 mmHg, closer to 140.   Did not tolerate spironolactone  secondary to stomach cramps  Currently on chlorthalidone  and potassium supplementation as well as valsartan  160 mg p.o. daily  Patient states that her blood pressure readings were better when she was on valsartan  320 mg p.o. daily, but the medication was difficult to swallow.   Given the fact that she has proteinuria and her blood pressures are still not at goal recommend uptitrating valsartan  to 320 mg p.o. daily.  However due to concerns for dysphagia patient prefers to be on valsartan  160 mg p.o. twice daily (this formulation tablets are smaller in size).   Check BMP in 1 week  Patient plans to see nephrology in the coming weeks  Hx-TIA (transient ischemic attack) Lacunar infarction [facility]) MRI findings of a lacunar infarct. Currently on antiplatelet therapies and statin. Zio patch results did not illustrate atrial fibrillation during the monitoring period. Re emphasize importance of blood pressure management. Reemphasized the  importance of secondary prevention with focus on improving her modifiable cardiovascular risk factors such as glycemic control, lipid management, blood pressure control, weight loss.  Hyperlipidemia, mixed: Restart Crestor  at a lower dose of 10 mg p.o. daily. Check fasting lipid profile and CMP in 6 weeks  Recommended that she follows up with PCP and may consider ultrasound of the abdomen or GI consultation due to concerns for elevated alkaline phosphatase to rule out other etiologies.  Orders Placed:  Orders Placed This Encounter  Procedures   Basic Metabolic Panel (BMET)   Comp Met (CMET)    Standing Status:   Future    Number of Occurrences:   1    Expiration Date:   04/06/2025   Lipid Profile    Standing Status:   Future    Number of Occurrences:   1    Expiration Date:   04/06/2025    Final Medication List:    Meds ordered this encounter  Medications   rosuvastatin  (CRESTOR ) 10 MG tablet    Sig: Take 1 tablet (10 mg total) by mouth daily.    Dispense:  90 tablet    Refill:  3   valsartan  (DIOVAN ) 160 MG tablet    Sig: Take 1 tablet (160 mg total) by mouth 2 (two) times daily.    Dispense:  180 tablet    Refill:  3    Medications Discontinued During This Encounter  Medication Reason   rosuvastatin  (CRESTOR ) 20 MG tablet Dose change   valsartan  (DIOVAN ) 160 MG tablet Dose change   spironolactone  (ALDACTONE ) 25 MG tablet Discontinued by provider       Current Outpatient Medications:    albuterol  (VENTOLIN  HFA) 108 (90 Base) MCG/ACT inhaler, Inhale 2 puffs into the lungs every 4 (four) hours as needed for wheezing or shortness of breath., Disp: , Rfl:    azathioprine  (IMURAN ) 100 MG tablet,  Take 1 tablet (100 mg total) by mouth daily as directed, Disp: 90 tablet, Rfl: 0   budesonide -formoterol  (SYMBICORT ) 80-4.5 MCG/ACT inhaler, Inhale 2 puffs into the lungs in the morning and at bedtime., Disp: 10.2 g, Rfl: 11   chlorthalidone  (HYGROTON ) 25 MG tablet, Take 1 tablet (25  mg total) by mouth daily., Disp: 90 tablet, Rfl: 2   Cholecalciferol (VITAMIN D-3 PO), Take by mouth., Disp: , Rfl:    Cyanocobalamin (VITAMIN B-12 PO), Take by mouth., Disp: , Rfl:    EPINEPHrine  0.3 mg/0.3 mL IJ SOAJ injection, Inject 0.3 mg into the muscle as needed for anaphylaxis., Disp: , Rfl:    EPINEPHrine  0.3 mg/0.3 mL IJ SOAJ injection, Inject 0.3 mg into the muscle as needed as directed., Disp: 2 each, Rfl: 1   famotidine  (PEPCID ) 20 MG tablet, Take 1 tablet (20 mg total) by mouth daily., Disp: 30 tablet, Rfl: 1   gabapentin  (NEURONTIN ) 100 MG capsule, Take 1 capsule (100 mg total) by mouth at bedtime., Disp: 30 capsule, Rfl: 1   levothyroxine  (SYNTHROID ) 112 MCG tablet, Take 1 tablet (112 mcg total) by mouth in the morning on an empty stomach for 30 days., Disp: 30 tablet, Rfl: 5   potassium chloride  (MICRO-K ) 10 MEQ CR capsule, Take 1 capsule (10 mEq total) by mouth 2 (two) times daily., Disp: 180 capsule, Rfl: 2   rosuvastatin  (CRESTOR ) 10 MG tablet, Take 1 tablet (10 mg total) by mouth daily., Disp: 90 tablet, Rfl: 3   valsartan  (DIOVAN ) 160 MG tablet, Take 1 tablet (160 mg total) by mouth 2 (two) times daily., Disp: 180 tablet, Rfl: 3   benzonatate  (TESSALON ) 100 MG capsule, Take 1 capsule (100 mg total) by mouth every 8 (eight) hours. (Patient not taking: Reported on 04/06/2024), Disp: 15 capsule, Rfl: 0   cephALEXin  (KEFLEX ) 500 MG capsule, Take 1 capsule (500 mg total) by mouth 2 (two) times daily. (Patient not taking: Reported on 04/06/2024), Disp: 14 capsule, Rfl: 0   clopidogrel  (PLAVIX ) 75 MG tablet, Take 1 tablet (75 mg total) by mouth daily. (Patient not taking: Reported on 04/06/2024), Disp: 90 tablet, Rfl: 0   doxycycline  (VIBRA -TABS) 100 MG tablet, Take 1 tablet (100 mg total) by mouth 2 (two) times daily with food for 7 days, Disp: 14 tablet, Rfl: 0   fluconazole  (DIFLUCAN ) 150 MG tablet, Take 1 tablet (150 mg total) by mouth daily as needed (yeast infection). (Patient not  taking: Reported on 04/06/2024), Disp: 1 tablet, Rfl: 0   levothyroxine  (SYNTHROID ) 112 MCG tablet, Take 1 tablet (112 mcg total) by mouth in the morning on an empty stomach., Disp: 30 tablet, Rfl: 5   mupirocin  ointment (BACTROBAN ) 2 %, Apply topically 2 (two) times daily for 14 days (Patient not taking: Reported on 04/06/2024), Disp: 22 g, Rfl: 1   predniSONE  (DELTASONE ) 20 MG tablet, Take 1 tablet (20 mg total) by mouth 2 (two) times daily. (Patient not taking: Reported on 04/06/2024), Disp: 10 tablet, Rfl: 0   promethazine -dextromethorphan (PROMETHAZINE -DM) 6.25-15 MG/5ML syrup, Take 5 mLs by mouth 4 (four) times daily as needed for cough. (Patient not taking: Reported on 04/06/2024), Disp: 118 mL, Rfl: 0  Consent:   N/A  Disposition:   Follow-up in 6 months   Her questions and concerns were addressed to her satisfaction. She voices understanding of the recommendations provided during this encounter.    Signed, Madonna Large, DO, [facility]  A Division of [facility] [address], [place],  [place] [address]   04/06/2024 6:07 PM

## 2024-04-07 ENCOUNTER — Ambulatory Visit: Admitting: Physical Medicine and Rehabilitation

## 2024-04-07 ENCOUNTER — Ambulatory Visit: Payer: Self-pay | Admitting: Cardiology

## 2024-04-07 ENCOUNTER — Other Ambulatory Visit: Payer: Self-pay

## 2024-04-07 VITALS — BP 159/98 | HR 81

## 2024-04-07 DIAGNOSIS — M5416 Radiculopathy, lumbar region: Secondary | ICD-10-CM | POA: Diagnosis not present

## 2024-04-07 DIAGNOSIS — Z79899 Other long term (current) drug therapy: Secondary | ICD-10-CM

## 2024-04-07 DIAGNOSIS — I10 Essential (primary) hypertension: Secondary | ICD-10-CM

## 2024-04-07 LAB — BASIC METABOLIC PANEL WITH GFR
BUN/Creatinine Ratio: 21 (ref 12–28)
BUN: 23 mg/dL (ref 8–27)
CO2: 24 mmol/L (ref 20–29)
Calcium: 9.6 mg/dL (ref 8.7–10.3)
Chloride: 101 mmol/L (ref 96–106)
Creatinine, Ser: 1.12 mg/dL — ABNORMAL HIGH (ref 0.57–1.00)
Glucose: 116 mg/dL — ABNORMAL HIGH (ref 70–99)
Potassium: 4.3 mmol/L (ref 3.5–5.2)
Sodium: 140 mmol/L (ref 134–144)
eGFR: 56 mL/min/1.73 — ABNORMAL LOW (ref >59)

## 2024-04-07 MED ORDER — METHYLPREDNISOLONE ACETATE 80 MG/ML IJ SUSP
40.0000 mg | Freq: Once | INTRAMUSCULAR | Status: AC
  Administered 2024-04-07: 40 mg

## 2024-04-07 NOTE — Progress Notes (Signed)
 Pain Scale   Average Pain 8 Patient advising she has lower back pain radiating to left leg. Pain is chronic--Patient advising she stopped PLAVIX  on 04/02/24.        +Driver, -BT, -Dye Allergies.

## 2024-04-12 ENCOUNTER — Ambulatory Visit: Payer: Medicaid Other | Admitting: Neurology

## 2024-04-12 ENCOUNTER — Encounter: Payer: Self-pay | Admitting: Neurology

## 2024-04-12 VITALS — BP 144/88 | HR 93 | Ht 65.0 in | Wt 192.2 lb

## 2024-04-12 DIAGNOSIS — I2510 Atherosclerotic heart disease of native coronary artery without angina pectoris: Secondary | ICD-10-CM | POA: Insufficient documentation

## 2024-04-12 DIAGNOSIS — I639 Cerebral infarction, unspecified: Secondary | ICD-10-CM

## 2024-04-12 DIAGNOSIS — G459 Transient cerebral ischemic attack, unspecified: Secondary | ICD-10-CM

## 2024-04-12 DIAGNOSIS — Z8673 Personal history of transient ischemic attack (TIA), and cerebral infarction without residual deficits: Secondary | ICD-10-CM | POA: Diagnosis not present

## 2024-04-12 NOTE — Patient Instructions (Signed)
 I had a long d/w patient and her husband about her recent TIA and lacunar stroke, recurrent episodes of brief altered awareness risk for recurrent stroke/TIAs, personally independently reviewed imaging studies and stroke evaluation results and answered questions..  I recommend she continue Plavix  75 mg daily   for secondary stroke prevention and maintain strict control of hypertension with blood pressure goal below 130/90, diabetes with hemoglobin A1c goal below 6.5% and lipids with LDL cholesterol goal below 70 mg/dL. I also advised the patient to eat a healthy diet with plenty of whole grains, cereals, fruits and vegetables, exercise regularly and maintain ideal body weight   Followup in the future with me only as needed or call earlier if necessary.

## 2024-04-12 NOTE — Progress Notes (Signed)
 [facility] [address] [place]. [place] [address] [phone]       OFFICE FOLLOW-UP VISIT NOTE  [name] Date of Birth:  1961-03-07 Medical Record Number:  [id number]   Referring MD:  Charmaine Bright, PA-c  Reason for Referral:  TIA  HPI: Initial visit 02/04/2023 [name] is a 63 year old African-American lady seen today for initial office consultation visit for TIA.  History is obtained from the patient and review of referral notes and personally reviewed pertinent available imaging films in PACS.  She has past medical history of hypothyroidism, hypertension, gastroesophageal flux disease and lumbar disc disease.  Patient states that on 01/16/2023 she was at home doing work on her office computer when she felt trembling in her head that lasted several minutes.  She had trouble figuring out the words on the computer screen and and typing.  She stated that she was scribbling and not able to write clearly.  She appeared to be in a daze for couple of hours.  Her husband was sleeping finally woke up and talk to her and she was able to reply.  She denied significant headache, slurred speech, extremity weakness.  She however states she did not try to get up and walk during this episode.  Her husband who had had a stroke felt concerned and patient called primary care physician who ordered an MRI scan of the brain which was done on 01/22/2023 which shows right internal capsule lacunar infarct which is acute.  Patient had some lab work on 02/02/2023 which showed LDL-cholesterol to be 82 mg percent and hemoglobin A1c to be 6.6.  She has not had any neurovascular imaging done.  She denies any prior history of strokes TIA seizures significant head injury with loss of consciousness, migraine headaches or any other neurological problems.  She does not smoke and does not drink alcohol in significant amounts.  She does snore but has not been evaluated for sleep apnea.  She does have an  upcoming appointment to see cardiologist Dr. Elmira next week. Update 07/23/2023 : She returns for follow-up after last visit 5 months ago.  She states she is doing well.  She has had no recurrent TIA or stroke symptoms.  She gets occasionally off balance but has had no falls or injuries.  Has been reports for the last few months has been having some episodes where she is staring into space for a shot.  This.  Does not swelling of the lips or fingers or twitching is noted.  She can be quickly aroused.  She does look dazed and has a vacant stare in her eyes.  This has been occurring daily for the last 2 months.  She denies any seizures or loss of consciousness tongue bite or injury.  She remains on Plavix  which she is tolerating well without bruising or bleeding.  She saw cardiologist Dr. Ginger recently increase the dose of Crestor  from 10 to 20 mg daily as follow-up lipid profile on 07/10/2023 showed LDL to be 87 mg percent.  She had CT angiogram of the brain and neck done on 02/13/2023 which showed no significant large vessel stenosis or occlusion.  Echocardiogram on 03/17/2023 showed EF to be 55 to 60%.  No cardiac source of embolism.  3-week heart monitor from 02/17/2023 to 03/09/2023 showed no evidence of atrial fibrillation.  Patient had a lot of lab work tested recently which were positive for vasculitic labs and she has been diagnosed with mixed connective tissue disorder  and is following up with rheumatologist Dr. Dolphus. Update 04/12/2024 : She returns for follow-up after last visit 8-1/2 months ago.  She is accompanied by her husband.  She states she is doing well.  She has had no TIA or strokelike symptoms.  She denies any episodes of altered awareness or seizure-like episode.  She had EEG on 07/23/2023 which showed no seizure activity and was normal.  She remains on Plavix  which she is tolerating well without bruising or bleeding.  Her blood pressure is under good control and today is 144/88.  She is  tolerating Crestor  well and in fact her cardiologist reduced the dose from 20 to 10 mg and plans to check repeat lipid profile tomorrow.  She has no new concerns. ROS:   14 system review of systems is positive for head trembling sensation, difficulty reading, difficulty writing, altered mental status and all other systems negative  PMH:  Past Medical History:  Diagnosis Date   Asthma    Depression    Dyspnea    Hyperlipidemia    Hypertension    Hypothyroidism    Sciatic nerve disease    Thyroid  disease     Social History:  Social History   Socioeconomic History   Marital status: Married    Spouse name: Not on file   Number of children: 4   Years of education: Not on file   Highest education level: Not on file  Occupational History   Not on file  Tobacco Use   Smoking status: Never    Passive exposure: Never   Smokeless tobacco: Never  Vaping Use   Vaping status: Never Used  Substance and Sexual Activity   Alcohol use: No   Drug use: No   Sexual activity: Yes    Birth control/protection: Surgical    Comment: partial hysterectomy  Other Topics Concern   Not on file  Social History Narrative   Not on file   Social Drivers of Health   Financial Resource Strain: Not on file  Food Insecurity: Not on file  Transportation Needs: Not on file  Physical Activity: Not on file  Stress: Not on file  Social Connections: Not on file  Intimate Partner Violence: Not on file    Medications:   Current Outpatient Medications on File Prior to Visit  Medication Sig Dispense Refill   albuterol  (VENTOLIN  HFA) 108 (90 Base) MCG/ACT inhaler Inhale 2 puffs into the lungs every 4 (four) hours as needed for wheezing or shortness of breath.     azathioprine  (IMURAN ) 100 MG tablet Take 1 tablet (100 mg total) by mouth daily as directed 90 tablet 0   budesonide -formoterol  (SYMBICORT ) 80-4.5 MCG/ACT inhaler Inhale 2 puffs into the lungs in the morning and at bedtime. 10.2 g 11    chlorthalidone  (HYGROTON ) 25 MG tablet Take 1 tablet (25 mg total) by mouth daily. 90 tablet 2   Cholecalciferol (VITAMIN D-3 PO) Take by mouth.     Cyanocobalamin (VITAMIN B-12 PO) Take by mouth.     doxycycline  (VIBRA -TABS) 100 MG tablet Take 1 tablet (100 mg total) by mouth 2 (two) times daily with food for 7 days 14 tablet 0   famotidine  (PEPCID ) 20 MG tablet Take 1 tablet (20 mg total) by mouth daily. 30 tablet 1   ferrous sulfate 325 (65 FE) MG EC tablet 1 tablet Orally daily     gabapentin  (NEURONTIN ) 100 MG capsule Take 1 capsule (100 mg total) by mouth at bedtime. 30 capsule 1  levothyroxine  (SYNTHROID ) 112 MCG tablet Take 1 tablet (112 mcg total) by mouth in the morning on an empty stomach for 30 days. 30 tablet 5   potassium chloride  (MICRO-K ) 10 MEQ CR capsule Take 1 capsule (10 mEq total) by mouth 2 (two) times daily. 180 capsule 2   rosuvastatin  (CRESTOR ) 10 MG tablet Take 1 tablet (10 mg total) by mouth daily. 90 tablet 3   valsartan  (DIOVAN ) 160 MG tablet Take 1 tablet (160 mg total) by mouth 2 (two) times daily. 180 tablet 3   benzonatate  (TESSALON ) 100 MG capsule Take 1 capsule (100 mg total) by mouth every 8 (eight) hours. (Patient not taking: Reported on 04/12/2024) 15 capsule 0   cephALEXin  (KEFLEX ) 500 MG capsule Take 1 capsule (500 mg total) by mouth 2 (two) times daily. (Patient not taking: Reported on 04/12/2024) 14 capsule 0   clopidogrel  (PLAVIX ) 75 MG tablet Take 1 tablet (75 mg total) by mouth daily. (Patient not taking: Reported on 04/12/2024) 90 tablet 0   EPINEPHrine  0.3 mg/0.3 mL IJ SOAJ injection Inject 0.3 mg into the muscle as needed for anaphylaxis. (Patient not taking: Reported on 04/12/2024)     EPINEPHrine  0.3 mg/0.3 mL IJ SOAJ injection Inject 0.3 mg into the muscle as needed as directed. (Patient not taking: Reported on 04/12/2024) 2 each 1   fluconazole  (DIFLUCAN ) 150 MG tablet Take 1 tablet (150 mg total) by mouth daily as needed (yeast infection).  (Patient not taking: Reported on 04/12/2024) 1 tablet 0   levothyroxine  (SYNTHROID ) 112 MCG tablet Take 1 tablet (112 mcg total) by mouth in the morning on an empty stomach. (Patient not taking: Reported on 04/12/2024) 30 tablet 5   mupirocin  ointment (BACTROBAN ) 2 % Apply topically 2 (two) times daily for 14 days (Patient not taking: Reported on 04/12/2024) 22 g 1   promethazine -dextromethorphan (PROMETHAZINE -DM) 6.25-15 MG/5ML syrup Take 5 mLs by mouth 4 (four) times daily as needed for cough. (Patient not taking: Reported on 04/12/2024) 118 mL 0   Current Facility-Administered Medications on File Prior to Visit  Medication Dose Route Frequency Provider Last Rate Last Admin   methylPREDNISolone acetate (DEPO-MEDROL) injection 40 mg  40 mg Other Once         Allergies:   Allergies  Allergen Reactions   Shellfish Allergy Anaphylaxis and Swelling   Avocado Swelling    Lips   Codeine  Other (See Comments)    Constipation  Other Reaction(s): Not available  codeine    Iodinated Contrast Media Other (See Comments)   Pineapple Swelling    lip   Penicillins Rash    Reaction: Childhood    Physical Exam General: well developed, well nourished pleasant middle aged african american lady , seated, in no evident distress Head: head normocephalic and atraumatic.   Neck: supple with no carotid or supraclavicular bruits Cardiovascular: regular rate and rhythm, no murmurs Musculoskeletal: no deformity Skin:  no rash/petichiae Vascular:  Normal pulses all extremities  Neurologic Exam Mental Status: Awake and fully alert. Oriented to place and time. Recent and remote memory intact. Attention span, concentration and fund of knowledge appropriate. Mood and affect appropriate.  Cranial Nerves: Fundoscopic exam not done. Pupils equal, briskly reactive to light. Extraocular movements full without nystagmus. Visual fields full to confrontation. Hearing intact. Facial sensation intact. Face, tongue,  palate moves normally and symmetrically.  Motor: Normal bulk and tone. Normal strength in all tested extremity muscles. Sensory.: intact to touch , pinprick , position and vibratory sensation.  Coordination: Rapid alternating movements  normal in all extremities. Finger-to-nose and heel-to-shin performed accurately bilaterally. Gait and Station: Arises from chair without difficulty. Stance is normal. Gait demonstrates normal stride length and balance . Able to heel, toe and tandem walk with mild difficulty.  Reflexes: 1+ and symmetric. Toes downgoing.       ASSESSMENT: 63 year old African-American lady with transient episode of altered consciousness and difficulty reading and writing possibly of left hemispheric TIA and July 2024 with MRI done 6 days later showing silent small right internal capsule acute lacunar infarct.  Vascular risk factors of diabetes, hypertension, hyperlipidemia obesity and at risk for sleep apnea.  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     PLAN:I had a long d/w patient and her husband about her recent TIA and lacunar stroke, recurrent episodes of brief altered awareness risk for recurrent stroke/TIAs, personally independently reviewed imaging studies and stroke evaluation results and answered questions..  I recommend she continue Plavix  75 mg daily   for secondary stroke prevention and maintain strict control of hypertension with blood pressure goal below 130/90, diabetes with hemoglobin A1c goal below 6.5% and lipids with LDL cholesterol goal below 70 mg/dL. I also advised the patient to eat a healthy diet with plenty of whole grains, cereals, fruits and vegetables, exercise regularly and maintain ideal body weight   Followup in the future with me only as needed or call earlier if necessary.      I personally spent a total of  35 minutes in the care of the patient today including getting/reviewing  separately obtained history, performing a medically appropriate exam/evaluation, counseling and educating, placing orders, referring and communicating with other health care professionals, documenting clinical information in the EHR, independently interpreting results, and coordinating care.        Eather Popp, MD Note: This document was prepared with digital dictation and possible smart phrase technology. Any transcriptional errors that result from this process are unintentional.

## 2024-04-14 ENCOUNTER — Ambulatory Visit: Admitting: Internal Medicine

## 2024-04-14 ENCOUNTER — Encounter: Payer: Self-pay | Admitting: Internal Medicine

## 2024-04-14 VITALS — BP 128/84 | HR 79 | Ht 65.0 in | Wt 192.0 lb

## 2024-04-14 DIAGNOSIS — R0609 Other forms of dyspnea: Secondary | ICD-10-CM | POA: Diagnosis not present

## 2024-04-14 DIAGNOSIS — Z23 Encounter for immunization: Secondary | ICD-10-CM | POA: Diagnosis not present

## 2024-04-14 NOTE — Patient Instructions (Addendum)
 Shortness of breath  ASthma   Symptoms of shortness of breath due to ashma No evidence of ILD or nodules on CT chest June 2025   PLAN -Start Symbicort  80/4.52 puffs 2 times daily AS NEEDED -Do pulmonary function test in Oct 2026 =   S[place]ogren  Plan  Imm[place]  per Dr Dolphus  Anemia  - you have new anemia since sept 2025; it is mild  Plan  - pls talk to PCP Katina Pfeiffer, PA-C  Flu vaccine need  Plan  -  flu shot 04/14/2024   Followup - 15 min visit in 1 year afer PFT

## 2024-04-14 NOTE — Progress Notes (Signed)
 OV 09/25/2022  Subjective:  Patient ID: Evelyn Baker, female , DOB: Mar 14, 1961 , age 63 y.o. , MRN: [id number] , ADDRESS: [address] [place] [address] PCP Katina Pfeiffer, PA-C Patient Care Team: Katina Pfeiffer, PA-C as PCP - General (Family Medicine)  This Provider for this visit: Treatment Team:  Attending Provider: Geronimo Amel, MD    09/25/2022 -   Chief Complaint  Patient presents with   Consult    Lung nodule. Possible ILD     HPI Evelyn Baker 63 y.o. -63 year old female accompanied by her husband.  She is originally from [place] .  Moved to [place] area in 1993.  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  She tells me that she was doing well till around Thanksgiving 2023.  She then started developing problems in the left shin including warmth and discoloration.  She was subsequently diagnosed to have rheumatoid arthritis as the main etiology for the problem.  She has been on Imuran  and prednisone  by Dr. Curt.  She says she is having a lot of flareups but she denies any chronic shortness of breath or cough or wheezing.  She also recollects that end of January 2024 she had influenza that was confirmed.  She had a lot of cough and this ultimately resolved.  Husband also was sick at this time.  Because of her rheumatoid arthritis issues she ended up having a CT scan of the chest abdomen pelvis 08/07/2022.  I personally visualized this and I agree with the radiologist that shows some groundglass opacities particularly in the right lower lobe and left lower lobe.  There is also some in the right upper lobe anterior segment.  This is a CT chest with contrast.  Given the concern of these infiltrates but also because of rheumatoid arthritis she had a follow-up scan 3 weeks later on 09/10/2022.  This was a high-resolution CT chest without contrast that I personally visualized.  A lot of these groundglass opacities have resolved/improved.   In the right upper lobe anterior segment that is now like a healing scar.  Radiologist also feels as widespread bronchiectasis although I am not personally able to appreciate that.  She also has diffuse lymphadenopathy.  She says she had a biopsy for this and the biopsies are benign.  The biopsy report are not available to me.  Currently she not having any shortness of breath cough or wheezing and she is asymptomatic and feeling well.  No B symptoms.  She is worried about ILD.    CT Chest data - HRCT 09/10/22  DDENDUM: As mentioned in the body of the report, there are multiple bilateral axillary and subpectoral lymph nodes (right-greater-than-left). This is nonspecific, but correlation with mammography and physical examination is recommended, as the possibility of malignancy such as breast cancer is not excluded.   These results will be called to the ordering clinician or representative by the Radiologist Assistant, and communication documented in the PACS or [employer].     Electronically Signed   By: Toribio Aye M.D.   On: 09/15/2022 05:47    Addended by Aye Toribio ORN, MD on 09/15/2022  5:49 AM    Study Result  Narrative & Impression  CLINICAL DATA:  63 year old female with history of asthma, shortness of breath and elevated sedimentation rate. Evaluate for interstitial lung disease.   EXAM: CT CHEST WITHOUT CONTRAST   TECHNIQUE: Multidetector CT imaging of the chest was performed following the standard  protocol without intravenous contrast. High resolution imaging of the lungs, as well as inspiratory and expiratory imaging, was performed.   RADIATION DOSE REDUCTION: This exam was performed according to the departmental dose-optimization program which includes automated exposure control, adjustment of the mA and/or kV according to patient size and/or use of iterative reconstruction technique.   COMPARISON:  CT of the chest, abdomen and pelvis 08/07/2022.    FINDINGS: Cardiovascular: Heart size is normal. Trace amount of pericardial fluid and/or thickening, unlikely to be of any hemodynamic significance at this time. No pericardial calcification. There is aortic atherosclerosis, as well as atherosclerosis of the great vessels of the mediastinum and the coronary arteries, including calcified atherosclerotic plaque in the left anterior descending and right coronary arteries.   Mediastinum/Nodes: No pathologically enlarged mediastinal or hilar lymph nodes. Please note that accurate exclusion of hilar adenopathy is limited on noncontrast CT scans. Esophagus is unremarkable in appearance. Numerous prominent axillary and subpectoral lymph nodes bilaterally (right-greater-than-left), largest is on the right side (axial image 24 of series 2) measuring 1.5 cm in short axis, increased compared to the prior study.   Lungs/Pleura: Widespread areas of cylindrical bronchiectasis with thickening of the peribronchovascular interstitium noted in the lungs bilaterally, most severe in the lung bases where there is some regional architectural distortion and some patchy ground-glass attenuation and volume loss. No other generalized regions of ground-glass attenuation, septal thickening, subpleural reticulation or honeycombing are noted. Inspiratory and expiratory imaging demonstrates some mild air trapping indicative of small airways disease. No acute consolidative airspace disease. No pleural effusions. No definite suspicious appearing pulmonary nodules or masses are noted.   Upper Abdomen: Unremarkable.   Musculoskeletal: There are no aggressive appearing lytic or blastic lesions noted in the visualized portions of the skeleton.   IMPRESSION: 1. 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 Further clinical evaluation is recommended. Strictly  speaking, interstitial lung disease is not entirely excluded, but not favored on the basis of today's examination. Repeat high-resolution chest CT should be considered in 12 months to assess for temporal changes in the appearance of the lung parenchyma. 2. Aortic atherosclerosis, in addition to two-vessel coronary artery disease. Please note that although the presence of coronary artery calcium  documents the presence of coronary artery disease, the severity of this disease and any potential stenosis cannot be assessed on this non-gated CT examination. Assessment for potential risk factor modification, dietary therapy or pharmacologic therapy may be warranted, if clinically indicated. 3. Trace volume of pericardial fluid and/or thickening, unlikely of hemodynamic significance at this time.   Aortic Atherosclerosis (ICD10-I70.0).   Electronically Signed: By: Toribio Aye M.D. On: 09/14/2022 12:02       OV 01/13/2023  Subjective:  Patient ID: Evelyn Baker, female , DOB: June 11, 1961 , age 4 y.o. , MRN: [id number] , ADDRESS: [address] [place] [address] PCP Katina Pfeiffer, PA-C Patient Care Team: Katina Pfeiffer, PA-C as PCP - General (Family Medicine)  This Provider for this visit: Treatment Team:  Attending Provider: Geronimo Amel, MD    01/13/2023 -   Chief Complaint  Patient presents with   Follow-up    F/u ct      HPI LEILY CAPEK 63 y.o. -resents with her husband to discuss test results.  She continues to be asymptomatic.  She maintains herself on Symbicort .  She has not had any rheumatoid arthritis flareu Interim Health status: No new complaints No  new medical problems. No new surgeries. No ER visits. No Urgent care visits. No changes to medications.  She had a CT scan of the chest and the right middle lobe bronchiectasis related infiltrates are significantly improved.  The groundglass opacities on the bottom of the lungs is nearly  resolved.  This is my personal independent interpretation.  There are no new issues.  She continues on Imuran .  She is concerned about her coronary artery calcification and small amount of pericardial fluid seen on both scans.  She does not have any chest pain.  I have referred her to Dr. Elmira.  She does not want to be seen at [facility] because of the delays.     CT Chest data from date:12/26/2022  - personally visualized and independently interpreted : Yes - my findings are: Read above  arrative & Impression  CLINICAL DATA:  Possible interstitial lung disease.   EXAM: CT CHEST WITHOUT CONTRAST   TECHNIQUE: Multidetector CT imaging of the chest was performed following the standard protocol without intravenous contrast. High resolution imaging of the lungs, as well as inspiratory and expiratory imaging, was performed.   RADIATION DOSE REDUCTION: This exam was performed according to the departmental dose-optimization program which includes automated exposure control, adjustment of the mA and/or kV according to patient size and/or use of iterative reconstruction technique.   COMPARISON:  09/10/2022, 08/07/2022.   FINDINGS: Cardiovascular: Heart is enlarged. Small amount of pericardial fluid may be physiologic.   Mediastinum/Nodes: 2.3 cm low-attenuation left thyroid  nodule. No pathologically enlarged mediastinal lymph nodes. Hilar regions are difficult to evaluate without IV contrast. Small to borderline enlarged axillary lymph nodes measure up to 1.4 cm on the right, as on 09/10/2022. Esophagus is grossly unremarkable.   Lungs/Pleura: Mild basilar cylindrical bronchiectasis, peribronchovascular nodularity and ground-glass. Negative for subpleural reticulation, traction bronchiectasis/bronchiolectasis, architectural distortion or honeycombing, including on prone imaging. Lungs are otherwise clear. No pleural fluid. Airway is unremarkable. No air trapping.   Upper Abdomen:  Visualized portions of the liver, gallbladder, adrenal glands, kidneys, spleen, pancreas, stomach and bowel are grossly unremarkable. No upper abdominal adenopathy.   Musculoskeletal: Degenerative changes in the spine. No worrisome lytic or sclerotic lesions.   IMPRESSION: 1. No evidence of interstitial lung disease. 2. Bibasilar postinfectious scarring. 3. Small to borderline enlarged axillary lymph nodes, as before. Difficult to exclude a lymphoproliferative disorder. 4. 2.3 cm low-attenuation left thyroid  nodule. Patient underwent thyroid  ultrasound 04/25/2022. No follow-up recommended unless clinically warranted. (Ref: J Am Coll Radiol. 2015 Feb;12(2): 143-50).     Electronically Signed   By: Newell Eke M.D.   On: 12/30/2022 11:49    OV 08/12/2023  Subjective:  Patient ID: Evelyn Baker, female , DOB: 10-10-1960 , age 23 y.o. , MRN: [id number] , ADDRESS: [address] [place] [address] PCP Katina Pfeiffer, PA-C Patient Care Team: Katina Pfeiffer, PA-C as PCP - General (Family Medicine) Michele Richardson, DO as PCP - Cardiology (Cardiology)  This Provider for this visit: Treatment Team:  Attending Provider: Geronimo Amel, MD    08/12/2023 -   Chief Complaint  Patient presents with   Follow-up    Increased SOB since Jan 2025. She gets winded walking upstairs. She is asking for new rx for symbicort .      HPI BERLIN VIERECK 63 y.o. -returns for follow-up.  Presents with her husband.  Husband's not a historian today.  She tells me since January 2025 she is having insidious onset of shortness of breath with exertion relieved  by rest.  This is when she walks around or goes up stairs.  It is relieved by rest.  Any respiratory infection symptoms make it worse but there is no wheezing no chest pain no chest tightness no paroxysmal nocturnal dyspnea no orthopnea no edema no cough.  She feels it is her asthma and she wants her Symbicort  refilled.  However in  the past have not addressed asthma with her.  In the fall 2024 she did have echocardiogram and there is no pericardial effusion but she did have grade 1 diastolic dysfunction.  Follow-up 2024 hemoglobin is normal.  100% REst with HR 79 -> 95% after 200 feet. No desats     OV 04/14/2024  Subjective:  Patient ID: Evelyn Baker, female , DOB: [birth year] , age 7 y.o. , MRN: [id number] , ADDRESS: [address] [place] [address] PCP Katina Pfeiffer, PA-C Patient Care Team: Katina Pfeiffer, PA-C as PCP - General (Family Medicine) Michele Richardson, DO as PCP - Cardiology (Cardiology)  This Provider for this visit: Treatment Team:  Attending Provider: Geronimo Amel, MD    04/14/2024 -   Chief Complaint  Patient presents with   Medical Management of Chronic Issues   Shortness of Breath    She states she had respiratory infection in Sept 2025- had to take keflex  and the cough she had has resolved. Her breathing is doing well today.      HPI YESIKA RISPOLI 63 y.o. -  #History of Sjogren's followed by rheumatology [facility] on Imuran   #Shortness of breath on exertion because of asthma [asthma manifested as dyspnea on exertion] no evidence of ILD  # Restrictive pulmonary function test due to obesity with normal DLCO  S: Returns for follow-up.  Presents with her husband.  Not seen since earlier in the year.  Overall doing well but she went on a 10-day cruise returning on March 14, 2024.  She went to the [place].  But on the way back from [place] she felt sick with respiratory complaints.  She is back to baseline.  This improved after taking Symbicort .  She uses Symbicort  as needed couple of times a month and she says she uses it exclusively for dyspnea on exertion for stairs or doing treadmill exercise.  It helps her shortness of breath.  She did have spirometry in June 2025 there is restriction but the DLCO is normal.  She had CT scan of the chest there is no lung nodules  no ILD.  No pneumonia no emphysema.  Review the labs indicate new onset anemia last month and repeat lab test this month shows is stable.  She will have regular dose flu shot today.    PFT     Latest Ref Rng & Units 12/21/2023   11:15 AM  PFT Results  FVC-Pre L 2.13   FVC-Predicted Pre % 62   FVC-Post L 2.08   FVC-Predicted Post % 60   Pre FEV1/FVC % % 77   Post FEV1/FCV % % 80   FEV1-Pre L 1.63   FEV1-Predicted Pre % 61   FEV1-Post L 1.66   DLCO uncorrected ml/min/mmHg 16.63   DLCO UNC% % 79   DLCO corrected ml/min/mmHg 16.62   DLCO COR %Predicted % 79   DLVA Predicted % 124   TLC L 4.43   TLC % Predicted % 85   RV % Predicted % 107        LAB RESULTS last 96 hours No results found.   CT  EPT 2025  arrative & Impression  EXAM: CT CHEST WITHOUT CONTRAST 03/22/2024 09:38:00 AM   TECHNIQUE: CT of the chest was performed without the administration of intravenous contrast. Multiplanar reformatted images are provided for review. Automated exposure control, iterative reconstruction, and/or weight based adjustment of the mA/kV was utilized to reduce the radiation dose to as low as reasonably achievable.   COMPARISON: 12/18/2023.   CLINICAL HISTORY: F/u pulmonary nodules ; No sx / ca ; Nonsmoker   FINDINGS:   MEDIASTINUM: Heart and pericardium are unremarkable. The central airways are clear.   LYMPH NODES: Bilateral axillary and subpectoral nodes have become less prominent in the interval. No mediastinal or hilar lymphadenopathy.   LUNGS AND PLEURA: No focal consolidation or pulmonary edema. No pleural effusion or pneumothorax.   SOFT TISSUES/BONES: No acute abnormality of the bones or soft tissues.   UPPER ABDOMEN: Limited images of the upper abdomen demonstrates no acute abnormality.   IMPRESSION: 1. No acute abnormality. 2. Bilateral axillary and subpectoral nodes have become less prominent in the interval.   Electronically signed by: Fonda Field MD 03/27/2024 05:47 PM EDT RP Workstation: [id number]    ECHO SEpt 2024  MPRESSIONS     1. Left ventricular ejection fraction, by estimation, is 55 to 60%. The  left ventricle has normal function. The left ventricle has no regional  wall motion abnormalities. Left ventricular diastolic parameters are  consistent with Grade I diastolic  dysfunction (impaired relaxation).   2. Right ventricular systolic function is normal. The right ventricular  size is normal. There is normal pulmonary artery systolic pressure. The  estimated right ventricular systolic pressure is 29.9 mmHg.   3. The mitral valve is grossly normal. Trivial mitral valve  regurgitation. No evidence of mitral stenosis.   4. The aortic valve is tricuspid. Aortic valve regurgitation is not  visualized. No aortic stenosis is present.   5. The inferior vena cava is dilated in size with >50% respiratory  variability, suggesting right atrial pressure of 8 mmHg.   6. Agitated saline contrast bubble study was negative, with no evidence  of any interatrial shunt.     has a past medical history of Asthma, Depression, Dyspnea, Hyperlipidemia, Hypertension, Hypothyroidism, Sciatic nerve disease, and Thyroid  disease.   reports that she has never smoked. She has never been exposed to tobacco smoke. She has never used smokeless tobacco.  Past Surgical History:  Procedure Laterality Date   FOOT SURGERY Left    STRABISMUS SURGERY     age 17   TOTAL ABDOMINAL HYSTERECTOMY     VEIN LIGATION AND STRIPPING Left 01/22/2021   Procedure: LEFT GREATER SAPHENOUS VEIN  LIGATION AND STRIPPING;  Surgeon: Harvey Carlin BRAVO, MD;  Location: [facility] OR;  Service: Vascular;  Laterality: Left;    Allergies  Allergen Reactions   Shellfish Allergy Anaphylaxis and Swelling   Avocado Swelling    Lips   Codeine  Other (See Comments)    Constipation  Other Reaction(s): Not available  codeine    Iodinated Contrast Media Other (See Comments)    Pineapple Swelling    lip   Penicillins Rash    Reaction: Childhood    Immunization History  Administered Date(s) Administered   Influenza, Seasonal, Injecte, Preservative Fre 04/14/2024   Influenza-Unspecified 03/30/2014, 07/13/2019, 03/01/2023   Pfizer(Comirnaty)Fall Seasonal Vaccine 12 years and older 03/10/2023    Family History  Problem Relation Age of Onset   Heart failure Mother    Kidney failure Mother    Stroke Sister  Aneurysm Sister    Heart Problems Brother    Hypertension Son    Hypertension Daughter      Current Outpatient Medications:    albuterol  (VENTOLIN  HFA) 108 (90 Base) MCG/ACT inhaler, Inhale 2 puffs into the lungs every 4 (four) hours as needed for wheezing or shortness of breath., Disp: , Rfl:    azathioprine  (IMURAN ) 100 MG tablet, Take 1 tablet (100 mg total) by mouth daily as directed, Disp: 90 tablet, Rfl: 0   benzonatate  (TESSALON ) 100 MG capsule, Take 1 capsule (100 mg total) by mouth every 8 (eight) hours., Disp: 15 capsule, Rfl: 0   budesonide -formoterol  (SYMBICORT ) 80-4.5 MCG/ACT inhaler, Inhale 2 puffs into the lungs in the morning and at bedtime., Disp: 10.2 g, Rfl: 11   chlorthalidone  (HYGROTON ) 25 MG tablet, Take 1 tablet (25 mg total) by mouth daily., Disp: 90 tablet, Rfl: 2   Cholecalciferol (VITAMIN D-3 PO), Take by mouth., Disp: , Rfl:    clopidogrel  (PLAVIX ) 75 MG tablet, Take 1 tablet (75 mg total) by mouth daily., Disp: 90 tablet, Rfl: 0   Cyanocobalamin (VITAMIN B-12 PO), Take by mouth., Disp: , Rfl:    EPINEPHrine  0.3 mg/0.3 mL IJ SOAJ injection, Inject 0.3 mg into the muscle as needed as directed., Disp: 2 each, Rfl: 1   ferrous sulfate 325 (65 FE) MG EC tablet, 1 tablet Orally daily, Disp: , Rfl:    levothyroxine  (SYNTHROID ) 112 MCG tablet, Take 1 tablet (112 mcg total) by mouth in the morning on an empty stomach., Disp: 30 tablet, Rfl: 5   potassium chloride  (MICRO-K ) 10 MEQ CR capsule, Take 1 capsule (10 mEq total) by mouth 2  (two) times daily., Disp: 180 capsule, Rfl: 2   promethazine -dextromethorphan (PROMETHAZINE -DM) 6.25-15 MG/5ML syrup, Take 5 mLs by mouth 4 (four) times daily as needed for cough., Disp: 118 mL, Rfl: 0   rosuvastatin  (CRESTOR ) 10 MG tablet, Take 1 tablet (10 mg total) by mouth daily., Disp: 90 tablet, Rfl: 3   valsartan  (DIOVAN ) 160 MG tablet, Take 1 tablet (160 mg total) by mouth 2 (two) times daily., Disp: 180 tablet, Rfl: 3  Current Facility-Administered Medications:    methylPREDNISolone acetate (DEPO-MEDROL) injection 40 mg, 40 mg, Other, Once,       Objective:   Vitals:   04/14/24 0936  BP: 128/84  Pulse: 79  SpO2: 100%  Weight: 192 lb (87.1 kg)  Height: 5' 5 (1.651 m)    Estimated body mass index is 31.95 kg/m as calculated from the following:   Height as of this encounter: 5' 5 (1.651 m).   Weight as of this encounter: 192 lb (87.1 kg).  @WEIGHTCHANGE @  Filed Weights   04/14/24 0936  Weight: 192 lb (87.1 kg)     Physical Exam   General: No distress. Looks well O2 at rest: no Cane present: no Sitting in wheel chair: no Frail: no Obese: yes Neuro: Alert and Oriented x 3. GCS 15. Speech normal Psych: Pleasant Resp:  Barrel Chest - noi.  Wheeze - no, Crackles - no, No overt respiratory distress CVS: Normal heart sounds. Murmurs - no Ext: Stigmata of Connective Tissue Disease - nio HEENT: Normal upper airway. PEERL +. No post nasal drip        Assessment/     Assessment & Plan DOE (dyspnea on exertion)  Need for influenza vaccination    PLAN Patient Instructions  Shortness of breath  ASthma   Symptoms of shortness of breath due to ashma No evidence of  ILD or nodules on CT chest June 2025   PLAN -Start Symbicort  80/4.52 puffs 2 times daily AS NEEDED -Do pulmonary function test in Oct 2026 =   Sjogren  Plan  Immuran  per Dr Dolphus  Anemia  - you have new anemia since sept 2025; it is mild  Plan  - pls talk to PCP Katina Pfeiffer, PA-C  Flu vaccine need  Plan  -  flu shot 04/14/2024   Followup - 15 min visit in 1 year afer PFT    FOLLOWUP    Return in about 1 year (around 04/14/2025) for 15 min visit, after Spiro and DLCO, with any of the APPS, with Dr Geronimo.    SIGNATURE    Dr. Dorethia Geronimo, M.D., F.C.C.P,  Pulmonary and Critical Care Medicine Staff Physician, [facility] Director - Interstitial Lung Disease  Program  Pulmonary Fibrosis [facility] at [facility] [place], [place], [address]  Pager: [phone], If no answer or between  15:00h - 7:00h: call [phone] Telephone: [phone]  10:04 AM 04/14/2024

## 2024-04-15 ENCOUNTER — Other Ambulatory Visit (HOSPITAL_COMMUNITY): Payer: Self-pay

## 2024-04-16 LAB — BASIC METABOLIC PANEL WITH GFR
BUN/Creatinine Ratio: 23 (ref 12–28)
BUN: 25 mg/dL (ref 8–27)
CO2: 25 mmol/L (ref 20–29)
Calcium: 9.5 mg/dL (ref 8.7–10.3)
Chloride: 104 mmol/L (ref 96–106)
Creatinine, Ser: 1.07 mg/dL — ABNORMAL HIGH (ref 0.57–1.00)
Glucose: 86 mg/dL (ref 70–99)
Potassium: 4.1 mmol/L (ref 3.5–5.2)
Sodium: 142 mmol/L (ref 134–144)
eGFR: 59 mL/min/1.73 — ABNORMAL LOW (ref >59)

## 2024-04-18 NOTE — Progress Notes (Signed)
 Evelyn Baker - 63 y.o. female MRN [id number]  Date of birth: 01-29-1961  Office Visit Note: Visit Date: 04/07/2024 PCP: Katina Pfeiffer, PA-C Referred by: Katina Pfeiffer, PA-C  Subjective: Chief Complaint  Patient presents with   Lower Back - Pain   HPI:  Evelyn Baker is a 63 y.o. female who comes in today at the request of Ronal Dragon Persons, PA-C for planned Left L5-S1 Lumbar Interlaminar epidural steroid injection with fluoroscopic guidance.  The patient has failed conservative care including home exercise, medications, time and activity modification.  This injection will be diagnostic and hopefully therapeutic.  Please see requesting physician notes for further details and justification.  She has been off her Plavix  for 7 days.  She will resume that after the injection.  She also has a listed iodine allergy but it is not anaphylactic reaction.  Small amount of contrast will be used.  Shellfish allergy is not crossed allergy with iodine.     ROS Otherwise per HPI.  Assessment & Plan: Visit Diagnoses:    ICD-10-CM   1. Lumbar radiculopathy  M54.16 XR C-ARM NO REPORT    Epidural Steroid injection    methylPREDNISolone acetate (DEPO-MEDROL) injection 40 mg      Plan: No additional findings.   Meds & Orders:  Meds ordered this encounter  Medications   methylPREDNISolone acetate (DEPO-MEDROL) injection 40 mg    Orders Placed This Encounter  Procedures   XR C-ARM NO REPORT   Epidural Steroid injection    Follow-up: Return for visit to requesting provider as needed.   Procedures: No procedures performed  Lumbar Epidural Steroid Injection - Interlaminar Approach with Fluoroscopic Guidance  Patient: Evelyn Baker      Date of Birth: 1960/11/04 MRN: [id number] PCP: Katina Pfeiffer, PA-C      Visit Date: 04/07/2024   Universal Protocol:     Consent Given By: the patient  Position: PRONE  Additional Comments: Vital signs were monitored before and after  the procedure. Patient was prepped and draped in the usual sterile fashion. The correct patient, procedure, and site was verified.   Injection Procedure Details:   Procedure diagnoses: Lumbar radiculopathy [M54.16]   Meds Administered:  Meds ordered this encounter  Medications   methylPREDNISolone acetate (DEPO-MEDROL) injection 40 mg     Laterality: Left  Location/Site:  L5-S1  Needle: 3.5 in., 20 ga. Tuohy  Needle Placement: Paramedian epidural  Findings:   -Comments: Excellent flow of contrast into the epidural space.  Procedure Details: Using a paramedian approach from the side mentioned above, the region overlying the inferior lamina was localized under fluoroscopic visualization and the soft tissues overlying this structure were infiltrated with 4 ml. of 1% Lidocaine  without Epinephrine . The Tuohy needle was inserted into the epidural space using a paramedian approach.   The epidural space was localized using loss of resistance along with counter oblique bi-planar fluoroscopic views.  After negative aspirate for air, blood, and CSF, a 2 ml. volume of Isovue-250 was injected into the epidural space and the flow of contrast was observed. Radiographs were obtained for documentation purposes.    The injectate was administered into the level noted above.   Additional Comments:  The patient tolerated the procedure well Dressing: 2 x 2 sterile gauze and Band-Aid    Post-procedure details: Patient was observed during the procedure. Post-procedure instructions were reviewed.  Patient left the clinic in stable condition.   Clinical History: EXAM DESCRIPTION: MR LUMBAR SPINE WO CONTRAST  CLINICAL HISTORY: Back pain.   COMPARISON: None Available.   TECHNIQUE: MRI of the lumbar spine is performed according to our usual protocol with axial and sagittal multi sequence imaging.   FINDINGS: Mild curvature of the lumbar spine. The alignment is otherwise  unremarkable. Moderate degenerative disc disease L5-S1 and mild at the other levels. No discogenic endplate marrow edema. The marrow signal is unremarkable as well as the conus and cauda equina.   L1-2 has a small central disc protrusion without stenosis.   L4-5 has mild foraminal narrowing on the left due to broad-based disc bulge and facet hypertrophy.   L5-S1 has mild bilateral foraminal narrowing and mild lateral recess narrowing on the left due to broad-based disc bulge and facet hypertrophy.   The image levels are otherwise unremarkable.   The imaged retroperitoneal structures are unremarkable.     IMPRESSION: Mild multilevel degenerative spondylosis with levels described in detail above.   No significant disc protrusion or evidence of impingement.   Electronically signed by: Reyes Frees MD 12/27/2023 07:34 PM EDT RP Workstation: [id number]     Objective:  VS:  HT:    WT:   BMI:     BP:(!) 159/98  HR:81bpm  TEMP: ( )  RESP:  Physical Exam Vitals and nursing note reviewed.  Constitutional:      General: She is not in acute distress.    Appearance: Normal appearance. She is not ill-appearing.  HENT:     Head: Normocephalic and atraumatic.     Right Ear: External ear normal.     Left Ear: External ear normal.  Eyes:     Extraocular Movements: Extraocular movements intact.  Cardiovascular:     Rate and Rhythm: Normal rate.     Pulses: Normal pulses.  Pulmonary:     Effort: Pulmonary effort is normal. No respiratory distress.  Abdominal:     General: There is no distension.     Palpations: Abdomen is soft.  Musculoskeletal:        General: Tenderness present.     Cervical back: Neck supple.     Right lower leg: No edema.     Left lower leg: No edema.     Comments: Patient has good distal strength with no pain over the greater trochanters.  No clonus or focal weakness.  Skin:    Findings: No erythema, lesion or rash.  Neurological:     General: No  focal deficit present.     Mental Status: She is alert and oriented to person, place, and time.     Sensory: No sensory deficit.     Motor: No weakness or abnormal muscle tone.     Coordination: Coordination normal.  Psychiatric:        Mood and Affect: Mood normal.        Behavior: Behavior normal.      Imaging: No results found.

## 2024-04-18 NOTE — Procedures (Signed)
 Lumbar Epidural Steroid Injection - Interlaminar Approach with Fluoroscopic Guidance  Patient: Evelyn Baker      Date of Birth: [birth year] MRN: [id number] PCP: Katina Pfeiffer, PA-C      Visit Date: 04/07/2024   Universal Protocol:     Consent Given By: the patient  Position: PRONE  Additional Comments: Vital signs were monitored before and after the procedure. Patient was prepped and draped in the usual sterile fashion. The correct patient, procedure, and site was verified.   Injection Procedure Details:   Procedure diagnoses: Lumbar radiculopathy [M54.16]   Meds Administered:  Meds ordered this encounter  Medications   methylPREDNISolone acetate (DEPO-MEDROL) injection 40 mg     Laterality: Left  Location/Site:  L5-S1  Needle: 3.5 in., 20 ga. Tuohy  Needle Placement: Paramedian epidural  Findings:   -Comments: Excellent flow of contrast into the epidural space.  Procedure Details: Using a paramedian approach from the side mentioned above, the region overlying the inferior lamina was localized under fluoroscopic visualization and the soft tissues overlying this structure were infiltrated with 4 ml. of 1% Lidocaine  without Epinephrine . The Tuohy needle was inserted into the epidural space using a paramedian approach.   The epidural space was localized using loss of resistance along with counter oblique bi-planar fluoroscopic views.  After negative aspirate for air, blood, and CSF, a 2 ml. volume of Isovue-250 was injected into the epidural space and the flow of contrast was observed. Radiographs were obtained for documentation purposes.    The injectate was administered into the level noted above.   Additional Comments:  The patient tolerated the procedure well Dressing: 2 x 2 sterile gauze and Band-Aid    Post-procedure details: Patient was observed during the procedure. Post-procedure instructions were reviewed.  Patient left the clinic in stable  condition.

## 2024-04-25 ENCOUNTER — Other Ambulatory Visit: Payer: Self-pay | Admitting: Neurology

## 2024-04-25 ENCOUNTER — Other Ambulatory Visit (HOSPITAL_COMMUNITY): Payer: Self-pay

## 2024-04-25 ENCOUNTER — Other Ambulatory Visit: Payer: Self-pay | Admitting: Physician Assistant

## 2024-04-25 ENCOUNTER — Other Ambulatory Visit: Payer: Self-pay

## 2024-04-25 MED ORDER — AZATHIOPRINE 100 MG PO TABS
100.0000 mg | ORAL_TABLET | Freq: Every day | ORAL | 0 refills | Status: DC
  Filled 2024-04-25: qty 90, 90d supply, fill #0

## 2024-04-25 MED ORDER — CLOPIDOGREL BISULFATE 75 MG PO TABS
75.0000 mg | ORAL_TABLET | Freq: Every day | ORAL | 0 refills | Status: DC
  Filled 2024-04-25: qty 30, 30d supply, fill #0

## 2024-04-25 NOTE — Telephone Encounter (Signed)
 Last Fill: 12/21/2023  Labs: 03/20/2024 CBC and CMP RBC 3.72 Hemoglobin 10.0 HCT 27.6 MCV 74.2 MCHC 36.2 RDW 15.6 BUN 26 Creatinine 1.16 GFR 54  Next Visit: Due 08/03/2024. Message sent to the front to schedule.   Last Visit: 03/03/2024  DX: Mixed connective tissue disease (HCC)   Current Dose per office note 03/03/2024: Imuran  100 mg p.o. daily   Okay to refill Imuran ?

## 2024-04-25 NOTE — Telephone Encounter (Signed)
 Last seen on 04/12/24 per note   Return if symptoms worsen or fail to improve.   No follow up scheduled   clopidogrel  (PLAVIX ) 75 MG tablet 01/20/2024 90 90 tablet Sethi, Pramod S, MD [facility]SABRA

## 2024-04-25 NOTE — Telephone Encounter (Signed)
 Please schedule patient a follow up visit. Patient due 08/03/2024. Thanks!

## 2024-05-02 ENCOUNTER — Encounter: Payer: Self-pay | Admitting: Radiology

## 2024-05-13 LAB — LAB REPORT - SCANNED
Albumin, Urine POC: 539.8
Microalb Creat Ratio: 430
PTH, Intact: 43

## 2024-05-14 ENCOUNTER — Encounter: Payer: Self-pay | Admitting: Rheumatology

## 2024-05-17 ENCOUNTER — Telehealth: Payer: Self-pay

## 2024-05-17 NOTE — Telephone Encounter (Signed)
 Labs received from: [facility]   Drawn on: 05/10/2024  Reviewed by: Dr. Dolphus  Labs drawn:  PE+Interp(Frx IFE), S  Iron and TIBC  Prot+CreatU (random)  Free K+L Lt Chains  Albumin/creatinine ratio, urine  Cystatin C   Ferritin   PTH, Intact    Results: Gamma globulin 2.1 Iron bind. Cap. (TIBC) 226 Protein/creat ratio 835 Free kappa lt chains, S 105.4 Free lambda lt chains, S 28.1 Kapp/lambda ratio, S 3.75 Alb/creat ratio 430 Cystatin C 1.71 Ferritin 381  Labs are in [facility] tab and a copy has also been sent to the scan center.

## 2024-05-24 ENCOUNTER — Other Ambulatory Visit (HOSPITAL_COMMUNITY): Payer: Self-pay

## 2024-05-24 MED ORDER — DOXYCYCLINE HYCLATE 100 MG PO TABS
100.0000 mg | ORAL_TABLET | Freq: Two times a day (BID) | ORAL | 0 refills | Status: DC
  Filled 2024-05-24: qty 14, 7d supply, fill #0

## 2024-05-24 NOTE — Telephone Encounter (Signed)
 Please advise patient to hold Imuran  until the infection resolves.  Then she may restart Imuran .

## 2024-05-30 ENCOUNTER — Other Ambulatory Visit (HOSPITAL_COMMUNITY): Payer: Self-pay

## 2024-05-30 ENCOUNTER — Encounter: Payer: Self-pay | Admitting: Cardiology

## 2024-05-30 DIAGNOSIS — I10 Essential (primary) hypertension: Secondary | ICD-10-CM

## 2024-05-30 MED ORDER — DOXYCYCLINE HYCLATE 100 MG PO CAPS
100.0000 mg | ORAL_CAPSULE | Freq: Two times a day (BID) | ORAL | 0 refills | Status: DC
  Filled 2024-05-30: qty 14, 7d supply, fill #0

## 2024-05-31 ENCOUNTER — Other Ambulatory Visit: Payer: Self-pay | Admitting: Neurology

## 2024-05-31 ENCOUNTER — Other Ambulatory Visit (HOSPITAL_COMMUNITY): Payer: Self-pay

## 2024-05-31 ENCOUNTER — Encounter: Payer: Self-pay | Admitting: Neurology

## 2024-05-31 ENCOUNTER — Encounter (HOSPITAL_COMMUNITY): Payer: Self-pay

## 2024-06-01 ENCOUNTER — Other Ambulatory Visit (HOSPITAL_COMMUNITY): Payer: Self-pay

## 2024-06-01 ENCOUNTER — Other Ambulatory Visit: Payer: Self-pay

## 2024-06-01 MED ORDER — CLOPIDOGREL BISULFATE 75 MG PO TABS
75.0000 mg | ORAL_TABLET | Freq: Every day | ORAL | 0 refills | Status: DC
  Filled 2024-06-01: qty 30, 30d supply, fill #0

## 2024-06-01 NOTE — Telephone Encounter (Signed)
 I called her just now - voicemail is full and not accepting messages.   Please have her come in for RN visit tomorrow or add on to my clinic.   Start Bidil 37.5/20mg  po tid.   If she any symptoms of CP/Shortness of breath/pain between the shoulder blades, focal deficits she should go to the ER as her reported SBP >165mmHG.   Please let me know what she decides.   Dr. Vishaal Strollo

## 2024-06-02 ENCOUNTER — Encounter: Payer: Self-pay | Admitting: Cardiology

## 2024-06-02 ENCOUNTER — Other Ambulatory Visit (HOSPITAL_COMMUNITY): Payer: Self-pay

## 2024-06-02 MED ORDER — ISOSORB DINITRATE-HYDRALAZINE 20-37.5 MG PO TABS
1.0000 | ORAL_TABLET | Freq: Three times a day (TID) | ORAL | 1 refills | Status: DC
  Filled 2024-06-02: qty 90, 30d supply, fill #0

## 2024-06-02 NOTE — Addendum Note (Signed)
 Addended by: VICCI ROXIE CROME on: 06/02/2024 03:17 PM   Modules accepted: Orders

## 2024-06-02 NOTE — Telephone Encounter (Signed)
 Spoke with patient and discussed recommendations from Dr. Michele.  Please have her come in for RN visit tomorrow or add on to my clinic.    Start Bidil 37.5/20mg  po tid.    If she any symptoms of CP/Shortness of breath/pain between the shoulder blades, focal deficits she should go to the ER as her reported SBP >149mmHG.    Scheduled patient for nurse visit for tomorrow 12/5 at 11:00 AM. Sent Rx for Bidil to pharmacy. Patient denies any CP, SOB or pain between the shoulder blades. Advised on ED precautions, patient verbalized understanding.

## 2024-06-02 NOTE — Telephone Encounter (Signed)
 See other mychart message.

## 2024-06-03 ENCOUNTER — Encounter (HOSPITAL_BASED_OUTPATIENT_CLINIC_OR_DEPARTMENT_OTHER): Payer: Self-pay | Admitting: Emergency Medicine

## 2024-06-03 ENCOUNTER — Ambulatory Visit

## 2024-06-03 ENCOUNTER — Other Ambulatory Visit (HOSPITAL_COMMUNITY): Payer: Self-pay

## 2024-06-03 ENCOUNTER — Emergency Department (HOSPITAL_BASED_OUTPATIENT_CLINIC_OR_DEPARTMENT_OTHER)

## 2024-06-03 ENCOUNTER — Other Ambulatory Visit: Payer: Self-pay

## 2024-06-03 ENCOUNTER — Encounter: Payer: Self-pay | Admitting: Physician Assistant

## 2024-06-03 ENCOUNTER — Ambulatory Visit: Attending: Physician Assistant | Admitting: Physician Assistant

## 2024-06-03 ENCOUNTER — Inpatient Hospital Stay (HOSPITAL_BASED_OUTPATIENT_CLINIC_OR_DEPARTMENT_OTHER)
Admission: EM | Admit: 2024-06-03 | Discharge: 2024-06-13 | Disposition: A | Source: Ambulatory Visit | Attending: Internal Medicine | Admitting: Internal Medicine

## 2024-06-03 VITALS — BP 190/100 | HR 84 | Ht 65.0 in | Wt 198.0 lb

## 2024-06-03 DIAGNOSIS — N179 Acute kidney failure, unspecified: Secondary | ICD-10-CM | POA: Diagnosis present

## 2024-06-03 DIAGNOSIS — I2510 Atherosclerotic heart disease of native coronary artery without angina pectoris: Secondary | ICD-10-CM

## 2024-06-03 DIAGNOSIS — I82412 Acute embolism and thrombosis of left femoral vein: Secondary | ICD-10-CM | POA: Diagnosis present

## 2024-06-03 DIAGNOSIS — E039 Hypothyroidism, unspecified: Secondary | ICD-10-CM | POA: Diagnosis present

## 2024-06-03 DIAGNOSIS — I10 Essential (primary) hypertension: Secondary | ICD-10-CM | POA: Diagnosis present

## 2024-06-03 DIAGNOSIS — F32A Depression, unspecified: Secondary | ICD-10-CM | POA: Diagnosis present

## 2024-06-03 DIAGNOSIS — E785 Hyperlipidemia, unspecified: Secondary | ICD-10-CM | POA: Diagnosis present

## 2024-06-03 DIAGNOSIS — M351 Other overlap syndromes: Secondary | ICD-10-CM | POA: Diagnosis present

## 2024-06-03 DIAGNOSIS — E876 Hypokalemia: Secondary | ICD-10-CM | POA: Diagnosis present

## 2024-06-03 DIAGNOSIS — E8809 Other disorders of plasma-protein metabolism, not elsewhere classified: Secondary | ICD-10-CM | POA: Diagnosis present

## 2024-06-03 DIAGNOSIS — E66812 Obesity, class 2: Secondary | ICD-10-CM | POA: Diagnosis present

## 2024-06-03 DIAGNOSIS — I129 Hypertensive chronic kidney disease with stage 1 through stage 4 chronic kidney disease, or unspecified chronic kidney disease: Secondary | ICD-10-CM | POA: Diagnosis present

## 2024-06-03 DIAGNOSIS — N1831 Chronic kidney disease, stage 3a: Secondary | ICD-10-CM | POA: Diagnosis present

## 2024-06-03 DIAGNOSIS — E872 Acidosis, unspecified: Secondary | ICD-10-CM | POA: Diagnosis present

## 2024-06-03 DIAGNOSIS — R600 Localized edema: Principal | ICD-10-CM

## 2024-06-03 DIAGNOSIS — I700 Atherosclerosis of aorta: Secondary | ICD-10-CM | POA: Diagnosis present

## 2024-06-03 DIAGNOSIS — K219 Gastro-esophageal reflux disease without esophagitis: Secondary | ICD-10-CM | POA: Diagnosis present

## 2024-06-03 DIAGNOSIS — Z7951 Long term (current) use of inhaled steroids: Secondary | ICD-10-CM | POA: Diagnosis not present

## 2024-06-03 DIAGNOSIS — Z79890 Hormone replacement therapy (postmenopausal): Secondary | ICD-10-CM | POA: Diagnosis not present

## 2024-06-03 DIAGNOSIS — E871 Hypo-osmolality and hyponatremia: Secondary | ICD-10-CM | POA: Diagnosis present

## 2024-06-03 DIAGNOSIS — L03116 Cellulitis of left lower limb: Secondary | ICD-10-CM | POA: Diagnosis present

## 2024-06-03 DIAGNOSIS — M069 Rheumatoid arthritis, unspecified: Secondary | ICD-10-CM | POA: Diagnosis present

## 2024-06-03 DIAGNOSIS — N289 Disorder of kidney and ureter, unspecified: Secondary | ICD-10-CM

## 2024-06-03 DIAGNOSIS — D509 Iron deficiency anemia, unspecified: Secondary | ICD-10-CM | POA: Diagnosis present

## 2024-06-03 DIAGNOSIS — Z531 Procedure and treatment not carried out because of patient's decision for reasons of belief and group pressure: Secondary | ICD-10-CM | POA: Diagnosis not present

## 2024-06-03 DIAGNOSIS — Z8673 Personal history of transient ischemic attack (TIA), and cerebral infarction without residual deficits: Secondary | ICD-10-CM

## 2024-06-03 DIAGNOSIS — J45909 Unspecified asthma, uncomplicated: Secondary | ICD-10-CM | POA: Diagnosis present

## 2024-06-03 DIAGNOSIS — Z7902 Long term (current) use of antithrombotics/antiplatelets: Secondary | ICD-10-CM | POA: Diagnosis not present

## 2024-06-03 DIAGNOSIS — Z7901 Long term (current) use of anticoagulants: Secondary | ICD-10-CM | POA: Diagnosis not present

## 2024-06-03 LAB — CBC WITH DIFFERENTIAL/PLATELET
Abs Immature Granulocytes: 0.04 K/uL (ref 0.00–0.07)
Basophils Absolute: 0 K/uL (ref 0.0–0.1)
Basophils Relative: 0 %
Eosinophils Absolute: 0.1 K/uL (ref 0.0–0.5)
Eosinophils Relative: 1 %
HCT: 22.5 % — ABNORMAL LOW (ref 36.0–46.0)
Hemoglobin: 8.3 g/dL — ABNORMAL LOW (ref 12.0–15.0)
Immature Granulocytes: 1 %
Lymphocytes Relative: 9 %
Lymphs Abs: 0.6 K/uL — ABNORMAL LOW (ref 0.7–4.0)
MCH: 26.7 pg (ref 26.0–34.0)
MCHC: 36.9 g/dL — ABNORMAL HIGH (ref 30.0–36.0)
MCV: 72.3 fL — ABNORMAL LOW (ref 80.0–100.0)
Monocytes Absolute: 0.5 K/uL (ref 0.1–1.0)
Monocytes Relative: 6 %
Neutro Abs: 6 K/uL (ref 1.7–7.7)
Neutrophils Relative %: 83 %
Platelets: 218 K/uL (ref 150–400)
RBC: 3.11 MIL/uL — ABNORMAL LOW (ref 3.87–5.11)
RDW: 16.9 % — ABNORMAL HIGH (ref 11.5–15.5)
WBC: 7.2 K/uL (ref 4.0–10.5)
nRBC: 0 % (ref 0.0–0.2)

## 2024-06-03 LAB — OCCULT BLOOD X 1 CARD TO LAB, STOOL: Fecal Occult Bld: NEGATIVE

## 2024-06-03 LAB — BASIC METABOLIC PANEL WITH GFR
Anion gap: 12 (ref 5–15)
BUN: 46 mg/dL — ABNORMAL HIGH (ref 8–23)
CO2: 24 mmol/L (ref 22–32)
Calcium: 9.1 mg/dL (ref 8.9–10.3)
Chloride: 99 mmol/L (ref 98–111)
Creatinine, Ser: 1.8 mg/dL — ABNORMAL HIGH (ref 0.44–1.00)
GFR, Estimated: 32 mL/min — ABNORMAL LOW (ref >60)
Glucose, Bld: 131 mg/dL — ABNORMAL HIGH (ref 70–99)
Potassium: 3.3 mmol/L — ABNORMAL LOW (ref 3.5–5.1)
Sodium: 135 mmol/L (ref 135–145)

## 2024-06-03 MED ORDER — ACETAMINOPHEN 325 MG PO TABS: 650.0000 mg | ORAL_TABLET | Freq: Four times a day (QID) | ORAL | Status: AC | PRN

## 2024-06-03 MED ORDER — HYDRALAZINE HCL 25 MG PO TABS
25.0000 mg | ORAL_TABLET | Freq: Every day | ORAL | 1 refills | Status: DC | PRN
  Filled 2024-06-03: qty 30, 30d supply, fill #0

## 2024-06-03 MED ORDER — ISOSORB DINITRATE-HYDRALAZINE 20-37.5 MG PO TABS
1.0000 | ORAL_TABLET | Freq: Three times a day (TID) | ORAL | Status: DC
  Administered 2024-06-03 – 2024-06-04 (×4): 1 via ORAL
  Filled 2024-06-03 (×7): qty 1

## 2024-06-03 MED ORDER — SENNOSIDES-DOCUSATE SODIUM 8.6-50 MG PO TABS
1.0000 | ORAL_TABLET | Freq: Every evening | ORAL | Status: DC | PRN
  Administered 2024-06-09 – 2024-06-13 (×2): 1 via ORAL
  Filled 2024-06-03 (×2): qty 1

## 2024-06-03 MED ORDER — HEPARIN (PORCINE) 25000 UT/250ML-% IV SOLN
1350.0000 [IU]/h | INTRAVENOUS | Status: DC
  Administered 2024-06-04: 1350 [IU]/h via INTRAVENOUS
  Administered 2024-06-05: 1250 [IU]/h via INTRAVENOUS
  Administered 2024-06-06: 1100 [IU]/h via INTRAVENOUS
  Filled 2024-06-03 (×3): qty 250

## 2024-06-03 MED ORDER — POTASSIUM CHLORIDE 20 MEQ PO PACK
20.0000 meq | PACK | Freq: Once | ORAL | Status: AC
  Administered 2024-06-03: 20 meq via ORAL
  Filled 2024-06-03: qty 1

## 2024-06-03 MED ORDER — LEVOTHYROXINE SODIUM 112 MCG PO TABS
112.0000 ug | ORAL_TABLET | Freq: Every day | ORAL | Status: DC
  Administered 2024-06-04 – 2024-06-13 (×10): 112 ug via ORAL
  Filled 2024-06-03 (×10): qty 1

## 2024-06-03 MED ORDER — SODIUM CHLORIDE 0.9 % IV BOLUS
500.0000 mL | Freq: Once | INTRAVENOUS | Status: AC
  Administered 2024-06-03: 500 mL via INTRAVENOUS

## 2024-06-03 MED ORDER — ONDANSETRON HCL 4 MG PO TABS: 4.0000 mg | ORAL_TABLET | Freq: Four times a day (QID) | ORAL | Status: DC | PRN

## 2024-06-03 MED ORDER — ROSUVASTATIN CALCIUM 10 MG PO TABS
10.0000 mg | ORAL_TABLET | Freq: Every day | ORAL | Status: DC
  Administered 2024-06-04 – 2024-06-13 (×10): 10 mg via ORAL
  Filled 2024-06-03 (×10): qty 1

## 2024-06-03 MED ORDER — FLUTICASONE FUROATE-VILANTEROL 100-25 MCG/ACT IN AEPB
1.0000 | INHALATION_SPRAY | Freq: Every day | RESPIRATORY_TRACT | Status: DC
  Administered 2024-06-10: 1 via RESPIRATORY_TRACT
  Filled 2024-06-03: qty 28

## 2024-06-03 MED ORDER — ONDANSETRON HCL 4 MG/2ML IJ SOLN
4.0000 mg | Freq: Four times a day (QID) | INTRAMUSCULAR | Status: DC | PRN
  Administered 2024-06-13: 06:00:00 4 mg via INTRAVENOUS
  Filled 2024-06-03: qty 2

## 2024-06-03 MED ORDER — SODIUM CHLORIDE 0.9% FLUSH
3.0000 mL | Freq: Two times a day (BID) | INTRAVENOUS | Status: DC
  Administered 2024-06-03 – 2024-06-13 (×19): 3 mL via INTRAVENOUS

## 2024-06-03 MED ORDER — ACETAMINOPHEN 650 MG RE SUPP: 650.0000 mg | Freq: Four times a day (QID) | RECTAL | Status: DC | PRN

## 2024-06-03 MED ORDER — APIXABAN 2.5 MG PO TABS
10.0000 mg | ORAL_TABLET | Freq: Once | ORAL | Status: AC
  Administered 2024-06-03: 10 mg via ORAL
  Filled 2024-06-03: qty 4

## 2024-06-03 MED ORDER — ALBUTEROL SULFATE (2.5 MG/3ML) 0.083% IN NEBU: 2.5000 mg | INHALATION_SOLUTION | Freq: Four times a day (QID) | RESPIRATORY_TRACT | Status: DC | PRN

## 2024-06-03 MED ORDER — ACETAMINOPHEN 500 MG PO TABS
1000.0000 mg | ORAL_TABLET | Freq: Once | ORAL | Status: AC
  Administered 2024-06-03: 1000 mg via ORAL
  Filled 2024-06-03: qty 2

## 2024-06-03 MED ORDER — CHLORTHALIDONE 25 MG PO TABS
25.0000 mg | ORAL_TABLET | Freq: Every day | ORAL | Status: DC
  Administered 2024-06-04 – 2024-06-09 (×6): 25 mg via ORAL
  Filled 2024-06-03 (×7): qty 1

## 2024-06-03 MED ORDER — LACTATED RINGERS IV SOLN: INTRAVENOUS | Status: AC

## 2024-06-03 NOTE — Discharge Instructions (Signed)
 Start blood thinning medicines and follow-up closely with your doctor to arrange repeat ultrasound of left lower extremity.  Return for sudden chest pain, shortness of breath, passing out, bleeding or new concerns.  Information on my medicine - ELIQUIS  (apixaban )  Why was Eliquis  prescribed for you? Eliquis  was prescribed to treat blood clots that may have been found in the veins of your legs (deep vein thrombosis) or in your lungs (pulmonary embolism) and to reduce the risk of them occurring again.  What do You need to know about Eliquis  ? The starting dose is 10 mg (two 5 mg tablets) taken TWICE daily for the FIRST SEVEN (7) DAYS, then on 06/14/24  the dose is reduced to ONE 5 mg tablet taken TWICE daily.  Eliquis  may be taken with or without food.   Try to take the dose about the same time in the morning and in the evening. If you have difficulty swallowing the tablet whole please discuss with your pharmacist how to take the medication safely.  Take Eliquis  exactly as prescribed and DO NOT stop taking Eliquis  without talking to the doctor who prescribed the medication.  Stopping may increase your risk of developing a new blood clot.  Refill your prescription before you run out.  After discharge, you should have regular check-up appointments with your healthcare provider that is prescribing your Eliquis .    What do you do if you miss a dose? If a dose of ELIQUIS  is not taken at the scheduled time, take it as soon as possible on the same day and twice-daily administration should be resumed. The dose should not be doubled to make up for a missed dose.  Important Safety Information A possible side effect of Eliquis  is bleeding. You should call your healthcare provider right away if you experience any of the following: Bleeding from an injury or your nose that does not stop. Unusual colored urine (red or dark brown) or unusual colored stools (red or black). Unusual bruising for unknown  reasons. A serious fall or if you hit your head (even if there is no bleeding).  Some medicines may interact with Eliquis  and might increase your risk of bleeding or clotting while on Eliquis . To help avoid this, consult your healthcare provider or pharmacist prior to using any new prescription or non-prescription medications, including herbals, vitamins, non-steroidal anti-inflammatory drugs (NSAIDs) and supplements.  This website has more information on Eliquis  (apixaban ): [email or web address] dena

## 2024-06-03 NOTE — H&P (Signed)
 History and Physical    Evelyn Baker FMW:[id number] DOB: 1961/02/01 DOA: 06/03/2024  PCP: Katina Pfeiffer, PA-C  Patient coming from: Home  I have personally briefly reviewed patient's old medical records in [facility]  Chief Complaint: Left lower extremity pain and swelling  HPI: Evelyn Baker is a 63 y.o. female with medical history significant for history of CVA on Plavix , HTN, HD, hypothyroidism, asthma, MCTD on azathioprine  who presented to the ED for evaluation of left lower extremity pain and swelling.  Patient states that she has been dealing with cellulitis involving her left lower extremity since 05/19/2024.  She has had associated swelling and pain of the distal lower extremity.  She has completed nearly 2 weeks of antibiotics with doxycycline .  This past Sunday she noticed new swelling involving the left foot.  Due to persistent symptoms she went to the ED for further evaluation.  She notes some intermittent exertional dyspnea.  She denies fevers, chills, diaphoresis, cough, chest pain.  She reports good urine output.  She denies abdominal pain.  She reports low appetite recently with nausea but no emesis.  She has had some semisoft stools recently.  She denies any obvious bleeding including epistaxis, hemoptysis, hematemesis, hematuria, hematochezia, or melena.  She has recently established with [facility], Dr. Macel, for evaluation of rising creatinine.  She denies use of NSAIDs.  [facility] ED Course  Labs/Imaging on admission: I have personally reviewed following labs and imaging studies.  Initial vitals showed BP 139/70, pulse 64, RR 18, temp 98.3 F, SpO2 96% on room air.  Labs showed WBC 7.2, hemoglobin 8.3, platelets 218, sodium 135, potassium 3.3, bicarb 24, BUN 46, creatinine 1.80, serum glucose 131.  FOBT negative.  Left lower extremity venous ultrasound showed partial compressibility of the left common femoral vein worrisome for a  nonocclusive thrombus.  Patient was given 500 cc normal saline and Eliquis  10 mg.  The hospitalist service was consulted for admission.  Review of Systems: All systems reviewed and are negative except as documented in history of present illness above.   Past Medical History:  Diagnosis Date   Asthma    Depression    Dyspnea    Hyperlipidemia    Hypertension    Hypothyroidism    Sciatic nerve disease    Thyroid  disease     Past Surgical History:  Procedure Laterality Date   FOOT SURGERY Left    STRABISMUS SURGERY     age 23   TOTAL ABDOMINAL HYSTERECTOMY     VEIN LIGATION AND STRIPPING Left 01/22/2021   Procedure: LEFT GREATER SAPHENOUS VEIN  LIGATION AND STRIPPING;  Surgeon: Harvey Carlin BRAVO, MD;  Location: [facility] OR;  Service: Vascular;  Laterality: Left;    Social History: Social History   Tobacco Use   Smoking status: Never    Passive exposure: Never   Smokeless tobacco: Never  Vaping Use   Vaping status: Never Used  Substance Use Topics   Alcohol use: No   Drug use: No   Allergies  Allergen Reactions   Shellfish Allergy Anaphylaxis and Swelling   Avocado Swelling    Lips   Codeine  Other (Evelyn Comments)    Constipation  Other Reaction(s): Not available  codeine    Iodinated Contrast Media Other (Evelyn Comments)   Pineapple Swelling    lip   Penicillins Rash    Reaction: Childhood    Family History  Problem Relation Age of Onset   Heart failure Mother    Kidney  failure Mother    Stroke Sister    Aneurysm Sister    Heart Problems Brother    Hypertension Son    Hypertension Daughter      Prior to Admission medications   Medication Sig Start Date End Date Taking? Authorizing Provider  albuterol  (VENTOLIN  HFA) 108 (90 Base) MCG/ACT inhaler Inhale 2 puffs into the lungs every 4 (four) hours as needed for wheezing or shortness of breath. 01/22/21   Bethanie Cough, PA-C  azathioprine  (IMURAN ) 100 MG tablet Take 1 tablet (100 mg total) by mouth daily as  directed 04/25/24   Dolphus Reiter, MD  benzonatate  (TESSALON ) 100 MG capsule Take 1 capsule (100 mg total) by mouth every 8 (eight) hours. 03/20/24   Geiple, Joshua, PA-C  budesonide -formoterol  (SYMBICORT ) 80-4.5 MCG/ACT inhaler Inhale 2 puffs into the lungs in the morning and at bedtime. 08/12/23   Geronimo Amel, MD  chlorthalidone  (HYGROTON ) 25 MG tablet Take 1 tablet (25 mg total) by mouth daily. 01/14/24   Tolia, Sunit, DO  Cholecalciferol (VITAMIN D-3 PO) Take by mouth.    [provider]  clopidogrel  (PLAVIX ) 75 MG tablet Take 1 tablet (75 mg total) by mouth daily. 06/01/24 06/01/25  Sethi, Pramod S, MD  Cyanocobalamin (VITAMIN B-12 PO) Take by mouth.    [provider]  doxycycline  (VIBRA -TABS) 100 MG tablet Take 1 tablet (100 mg total) by mouth 2 (two) times daily. 05/24/24     doxycycline  (VIBRAMYCIN ) 100 MG capsule Take 1 capsule (100 mg total) by mouth 2 (two) times daily. 05/30/24     EPINEPHrine  0.3 mg/0.3 mL IJ SOAJ injection Inject 0.3 mg into the muscle as needed as directed. 02/11/24     ferrous sulfate 325 (65 FE) MG EC tablet 1 tablet Orally daily    [provider]  hydrALAZINE  (APRESOLINE ) 25 MG tablet Take 1 tablet (25 mg) once daily as needed for systolic blood pressure > 170 87/4/74   Weaver, Scott T, PA-C  isosorbide -hydrALAZINE  (BIDIL ) 20-37.5 MG tablet Take 1 tablet by mouth 3 (three) times daily. 06/02/24   Tolia, Sunit, DO  levothyroxine  (SYNTHROID ) 112 MCG tablet Take 1 tablet (112 mcg total) by mouth in the morning on an empty stomach. 11/09/23     potassium chloride  (MICRO-K ) 10 MEQ CR capsule Take 1 capsule (10 mEq total) by mouth 2 (two) times daily. 04/04/24   Tolia, Sunit, DO  promethazine -dextromethorphan (PROMETHAZINE -DM) 6.25-15 MG/5ML syrup Take 5 mLs by mouth 4 (four) times daily as needed for cough. 03/20/24   Geiple, Joshua, PA-C  rosuvastatin  (CRESTOR ) 10 MG tablet Take 1 tablet (10 mg total) by mouth daily. 04/06/24 07/05/24  Tolia,  Sunit, DO  valsartan  (DIOVAN ) 160 MG tablet Take 1 tablet (160 mg total) by mouth 2 (two) times daily. 04/06/24   Michele Richardson, DO    Physical Exam: Vitals:   06/03/24 1416 06/03/24 1430 06/03/24 1715 06/03/24 2032  BP: (!) 149/91 139/70 (!) 152/78 (!) 157/93  Pulse: (!) 103 64 78 77  Resp: 18  16 18   Temp: 98.3 F (36.8 C)  98.3 F (36.8 C) 98.3 F (36.8 C)  TempSrc:   Oral   SpO2: 99% 96% 96% 98%   Constitutional: Resting in bed, NAD, calm, comfortable Eyes: EOMI, lids and conjunctivae normal ENMT: Mucous membranes are moist. Posterior pharynx clear of any exudate or lesions.Normal dentition.  Neck: normal, supple, no masses. Respiratory: clear to auscultation bilaterally, no wheezing, no crackles. Normal respiratory effort. No accessory muscle use.  Cardiovascular: Regular rate  and rhythm, no murmurs / rubs / gallops.  +1 bilateral lower extremity edema, left greater than right. 2+ pedal pulses. Abdomen: Soft, no tenderness, no masses palpated. Musculoskeletal: no clubbing / cyanosis. No joint deformity upper and lower extremities. Good ROM, no contractures. Normal muscle tone.  Skin: no rashes, lesions, ulcers. No induration Neurologic: Sensation intact. Strength 5/5 in all 4.  Psychiatric: Normal judgment and insight. Alert and oriented x 3. Normal mood.   EKG: Ordered and pending.  Assessment/Plan Principal Problem:   Acute deep vein thrombosis (DVT) of left femoral vein (HCC) Active Problems:   AKI (acute kidney injury)   Microcytic anemia   Mixed connective tissue disease   Hypokalemia   Hypertension   Hypothyroidism   Hyperlipidemia   Asthma   History of CVA (cerebrovascular accident)   Evelyn Baker is a 63 y.o. 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  Assessment and Plan: Acute DVT of left common femoral vein: Patient  was given oral Eliquis  in the ED however given AKI and downtrending hemoglobin will switch to IV heparin  tonight. - IV heparin  per pharmacy - Repeat CBC in a.m.  Acute kidney injury: Creatinine 1.80 on admission compared to previous baseline ~1.0-1.1 range.  This could be multifactorial from medication effect and decreased oral intake.  She has recently established with nephrology for further evaluation. - Continue IV fluid hydration overnight - Hold valsartan  - Check urine studies  Microcytic anemia: Hemoglobin 8.3 on admission compared to previous 10.0 in September.  Hemoglobin was in the normal range in April.  Patient denies obvious bleeding.  She has been on Plavix  for history of CVA.  FOBT is negative.  She is a [employer] and does not accept blood products. - Monitor for signs/symptoms of bleeding while on anticoagulation - Check anemia panel - Repeat CBC in a.m.  Hypokalemia: Supplementing.  Hypertension: Continue BiDil  and resume chlorthalidone  tomorrow if potassium improved.  Holding valsartan .  History of CVA: Holding Plavix  for now since she has been started on full dose anticoagulation.  Continue rosuvastatin .  Hypothyroidism: Continue Synthroid .  Hyperlipidemia: Continue rosuvastatin .  Asthma: Stable, no wheezing on admission.  Continue Symbicort  and albuterol  as needed.  Mixed connective tissue disease/osteoarthritis: Follows with rheumatology, Dr. Dolphus, and is managed on Imuran  as an outpatient.  Patient states Imuran  has been held recently while on antibiotics.   DVT prophylaxis: IV heparin  Code Status: Full code, confirmed with patient on admission Family Communication: Spouse at bedside Disposition Plan: From home and likely discharge to home pending clinical progress Consults called: None Severity of Illness: The appropriate patient status for this patient is INPATIENT. Inpatient status is judged to be reasonable and necessary in order to  provide the required intensity of service to ensure the patient's safety. The patient's presenting symptoms, physical exam findings, and initial radiographic and laboratory data in the context of their chronic comorbidities is felt to place them at high risk for further clinical deterioration. Furthermore, it is not anticipated that the patient will be medically stable for discharge from the hospital within 2 midnights of admission.   * I certify that at the point of admission it is my clinical judgment that the patient will require inpatient hospital care spanning beyond 2 midnights from the point of admission due to high intensity of service, high risk for further deterioration and high frequency of surveillance  required.DEWAINE Evelyn Blanch MD [facility]  If 7PM-7AM, please contact night-coverage [email or web address]  06/03/2024, 9:35 PM

## 2024-06-03 NOTE — Progress Notes (Signed)
 PHARMACY - ANTICOAGULATION CONSULT NOTE  Pharmacy Consult for Heparin  Indication: DVT  Allergies  Allergen Reactions   Shellfish Allergy Anaphylaxis and Swelling   Avocado Swelling    Lips   Codeine  Other (See Comments)    Constipation  Other Reaction(s): Not available  codeine    Iodinated Contrast Media Other (See Comments)   Pineapple Swelling    lip   Penicillins Rash    Reaction: Childhood    Patient Measurements: IBW: 57 kg Total BW 89.8 kg Heparin  dosing weight: 77kg    Vital Signs: Temp: 98.3 F (36.8 C) (12/05 2032) Temp Source: Oral (12/05 1715) BP: 157/93 (12/05 2032) Pulse Rate: 77 (12/05 2032)  Labs: Recent Labs    06/03/24 1423  HGB 8.3*  HCT 22.5*  PLT 218  CREATININE 1.80*    Estimated Creatinine Clearance: 36.3 mL/min (A) (by C-G formula based on SCr of 1.8 mg/dL (H)).   Medical History: Past Medical History:  Diagnosis Date   Asthma    Depression    Dyspnea    Hyperlipidemia    Hypertension    Hypothyroidism    Sciatic nerve disease    Thyroid  disease      Assessment: Active Problem(s): leg swelling - persistent and worsening left lower leg and foot swelling and discomfort despite taking antibiotics for almost 2 weeks for possible cellulitis.  - Baseline Hgb low 8.3 and AKI Scr 1.8  AC/Heme: LLE DVT. Start IV heparin . No anticoag PTA - Baseline Hgb 8.3.  Goal of Therapy:  Heparin  level 0.3-0.7 units/ml Monitor platelets by anticoagulation protocol: Yes   Plan:  Eliquis  10mg  po x 1 (1934 on 12/5) Start IV heparin  in AM after 12hr post-Eliquis . Heparin  infusion 1350 units/hr (on 12/6 at 0800) Check heparin  level in 6-8 hrs Daily HL and CBC   Cyril Woodmansee Karoline Marina, PharmD, BCPS Clinical Staff Pharmacist Marina Salines [facility]illing[facility] 06/03/2024,9:28 PM

## 2024-06-03 NOTE — Patient Instructions (Signed)
 Medication Instructions:   ONLY AS NEEDED :  HYDRALAZINE   25 MG   FOR BLOOD PRESSURE  SYSTOLIC( TOP NUMBER)   HIGHER THAN 170   KEEP A LOG  OF  BLOOD  PRESSURE  READINGS AND SEND IN TO MYCHART   MONDAY OR TUESDAY    *If you need a refill on your cardiac medications before your next appointment, please call your pharmacy*    Lab Work: NONE ORDERED  TODAY     If you have labs (blood work) drawn today and your tests are completely normal, you will receive your results only by: MyChart Message (if you have MyChart) OR A paper copy in the mail If you have any lab test that is abnormal or we need to change your treatment, we will call you to review the results.    Testing/Procedures: NONE ORDERED  TODAY    Follow-Up: At [facility], you and your health needs are our priority.  As part of our continuing mission to provide you with exceptional heart care, our providers are all part of one team.  This team includes your primary Cardiologist (physician) and Advanced Practice Providers or APPs (Physician Assistants and Nurse Practitioners) who all work together to provide you with the care you need, when you need it.  Your next appointment:   2 -3  week(s)  Provider:  BERDINE D FOR HYPERTENSION      We recommend signing up for the patient portal called MyChart.  Sign up information is provided on this After Visit Summary.  MyChart is used to connect with patients for Virtual Visits (Telemedicine).  Patients are able to view lab/test results, encounter notes, upcoming appointments, etc.  Non-urgent messages can be sent to your provider as well.   To learn more about what you can do with MyChart, go to [email or web address].   Other Instructions

## 2024-06-03 NOTE — Hospital Course (Addendum)
 Evelyn Baker is a 63 y.o. 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 Hgb Trended down but no active Sx of bleeding. Started IV Iron  and sq EPO. Hematology consulted for further evaluation and recommendations. GI Consulted given drop in Hgb and deferring endoscopic evaluation until Hgb is >7. Hgb Low but fairly stable. She will complete 5 days of EPO and anticipate D/C tomorrow if asymptomatic as she will have close follow up with Hematology. She will also need to follow-up with Gastroenterology, Nephrology and Vascular Surgery in outpatient setting  Assessment and Plan:  Acute Left, Femoral Vein DVT: Patient denies any prior history of DVT, no recent surgeries.  Patient stated had a 4-hour car ride recently. -Patient noted the morning of 06/04/2024 with complaints of shortness of breath per RN.   -Patient denies any further significant shortness of breath.  -VQ scan done and showed that the pulmonary perfusion is within normal limits. There are no wedge-shaped perfusion defects to suggest acute pulmonary embolism.. -Patient was on IV heparin  however heparin  levels could not be obtained this morning as pediatric tubes were not available.  As patient states Jehovah's Witness was concerned about her hemoglobin levels. Discontinued IV heparin  and placed on full dose Lovenox  and subsequently transitioned to Apixaban  on 06/07/24 but started holding 12/12 -Keep left lower extremity elevated.   -Continue scheduled Acetaminophen  1000 mg po TID for pain control.   -Hematology consulted and she will need to see them in outpatient setting for Hypercoaguable workup and appointment is scheduled for 12/16   AKI on CKD Stage 3a / Metabolic Acidosis: Patient noted to have a creatinine of 1.80 on admission compared to previous baseline of approximately 1.0-1.1. Per chart review  patient with a creatinine 1.07 on 04/15/2024. Etiology of AKI likely multifactorial secondary to prerenal azotemia due to decreased oral intake in the setting of ARB. Urinalysis with 100 protein, small leukocytes, nitrite negative, rare bacteria, 11-20 WBCs. Urine sodium at 59, urine creatinine at 83. BUN/Cr Trend: Recent Labs  Lab 06/06/24 0857 06/07/24 0536 06/08/24 0533 06/09/24 0554 06/10/24 0531 06/11/24 0800 06/12/24 0722  BUN 29* 31* 27* 23 26* 24* 25*  CREATININE 1.39* 1.29* 1.18* 1.34* 1.43* 1.48* 1.34*  -Had a slight metabolic acidosis with a CO2 of 21, anion gap of 11, chloride level of 96 -Continue to hold ARB and likely will not resume on discharge. Saline lock IV fluids and give a Dose of IV Lasix  20 mg x1 again -Avoid Nephrotoxic Medications and stop Chlorthalidone , Contrast Dyes, Hypotension and Dehydration to Ensure Adequate Renal Perfusion and will need to Renally Adjust Meds. CTM and Trend Renal Function carefully and repeat CMP in the AM  -Will need outpt F/U w/ Nephrology   Acute Microcytic Anemia:  Hemoglobin noted to 8.3 on admission with prior hemoglobin of 10.0 on 03/20/2024 and prior to that 8.5 on 03/15/2024 but back in April of this year was 12.1. Hgb/Hct continues to Trend down slowly but Patient with no overt bleeding or symptoms as current trend shows:  Recent Labs  Lab 06/06/24 0857 06/07/24 0536 06/08/24 0533 06/09/24 0554 06/10/24 0531 06/11/24 0800 06/12/24 0722  HGB 8.0* 7.2* 6.8* 6.8* 6.6* 6.7* 6.4*  HCT 21.4* 20.0* 19.4* 19.7* 18.0* 18.5* 17.6*  MCV 72.5* 73.0* 75.2* 76.1* 73.2* 73.7* 74.6*  -FOBT negative. Anemia panel with iron  level of 23, TIBC of 165, ferritin of  473, folate of 7.5. Hemoglobin currently at 6.8 today, partly dilutional. Patient is a [employer] and as such does not accept blood products.  -Patient agreeable to IV iron  so gave her a dose of Iron  Sucrose 200 mg x1 and Epoetin  Alfa-epbx 5,000 units on 12/10. On 12/11 gave her a  dose of Iron  Sucrose 100 mg x1, IV Folic Acid  1 mg, and Epoetin  Alfa-epbx 20,000 units x1 and this was repeated 12/12, 12/13 and being repeated 12/14 but will increase Epotein Alfa to 40,000 units sq at the recommendation of Oncology  -Hematology consulted for further evaluation and in agreement with current management. Patient to follow up with Hematology as an outpatient eventually and has appointment 06/14/24 -GI also consulted and they had a detailed discussion with the patient's family and patient herself and feels that she cannot undergo sedation for endoscopic evaluation at this time given that her hemoglobin is less than 7. GI has no new Recc's and have signed off the case and will follow in the outpatient setting. -Repeat CT scan of the abdomen pelvis without contrast has been done and it showed No acute abdominopelvic abnormality. No evidence of retroperitoneal hematoma but did show Findings of volume overload with trace right and small left pleural effusions, diffuse body wall edema, trace presacral edema, and pericardial effusion. Aortic Atherosclerosis -Given CT Findings will give IV Lasix  20 mg again. Given 12/12, 12/13 and will give again today.    Hypokalemia: K+ is now 3.9. CTM and Replete as Necessary.   HypoNatremia: Likely hypervolemic hyponatremia; Na+ Trend:  Recent Labs  Lab 06/06/24 0857 06/07/24 0536 06/08/24 0533 06/09/24 0554 06/10/24 0531 06/11/24 0800 06/12/24 0722  NA 130* 132* 134* 131* 129* 129* 130*  -Stop Chlorthalidone  and Give her a dose of low dose Lasix  20 mg x1 again today. CTM and Trend and Repeat CMP in the AM    Essential Hypertension: ARB on hold secondary to AKI. Continue BiDil  20-37.5 mg per tab 2 tabs 3 times daily. C/w Amlodipine  10 mg po Daily and Chlorthalidone  25 mg po Daily. CTM BP per Protocol. Last BP reading was 135/84. C/w Carvedilol  6.25 mg po BID. Add IV Labetalol  10 mg q2hprn for SBP >180 or DBP >100. Outpatient follow-up with  Cardiology.   History of CVA: Plavix  on hold as patient currently on full dose anticoagulation. Continue Rosuvastatin  10 mg po Daily.   Nausea and vomiting: Mild.  Continue with antiemetics w/ Ondansetron  4 mg po/IV q6hprn   Hypothyroidism: C/w Levothyroxine  112 mcg po Daily     Hyperlipidemia: C/w Rosuvastatin  10 mg po Daily    Mixed connective tissue disease/osteoarthritis -Patient being followed by rheumatology, Dr. Dolphus and managed on Azathioprine  100g mg po daily in the outpatient setting. Patient states Imuran  held recently while on antibiotics but Resumed Imuran . Will need Outpatient follow-up with Rheumatology.   Asthma: Stable C/w Symbicort  substitution with Fluticasone  Furoate-Vilanterol 1 puff IH Daily and Albuterol  as needed.   Hypoalbuminemia: Patient's Albumin  Lvl is now 2.9. CTM & Trend & repeat CMP int he AM  Class II Obesity: Complicates overall prognosis and care. Estimated body mass index is 35.05 kg/m as calculated from the following:   Height as of this encounter: 5' 5 (1.651 m).   Weight as of this encounter: 95.5 kg. Weight Loss and Dietary Counseling given

## 2024-06-03 NOTE — ED Triage Notes (Signed)
 Pt caox4 ambulatory c/o L lower leg pain and L foot swelling stating that she has had cellulitis in L lower leg x3 wks, taking abx x2 wks but started having swelling in L foot Sunday.

## 2024-06-03 NOTE — Progress Notes (Signed)
 OFFICE NOTE:    Date:  06/03/2024  ID:  Evelyn Baker, DOB 11-24-1960, MRN [id number] PCP: Katina Pfeiffer, PA-C  [facility] Providers Cardiologist:  Madonna Large, DO        Coronary artery Ca2+ Nuclear stress test (03/05/2023): Equivocal for ischemia, no ischemia or infarction, low risk Hypertension Hyperlipidemia Hx of Transient ischemic attack (TIA) Hx of Lacunar stroke Echocardiogram with bubble study (03/17/2023): EF 55-60%, no RWMA, normal RVSF, normal PASP, RVSP 29.9 mmHg, trivial MR, bubble study negative  Hypothyroidism Depression Asthma Gastroesophageal reflux disease (GERD) Rheumatoid arthritis Aortic atherosclerosis Anemia        Discussed the use of AI scribe software for clinical note transcription with the patient, who gave verbal consent to proceed. History of Present Illness Evelyn Baker is a 63 y.o. female presents for follow-up on blood pressure management.  She was previously placed on spironolactone  for hypertension and hypokalemia but could not tolerate it and returned to chlorthalidone . At her last visit in Oct 2025, valsartan  was increased to 320 mg daily, and she was left on potassium supplementation due to a history of hypokalemia and discontinuation of spironolactone .  She contacted the office earlier this week with elevated blood pressures. She is currently on doxycycline  for cellulitis. She was started on BiDil  37.5/20 mg three times a day.  She has not taken her blood pressure medications today. She reports nausea and inability to eat. She feels like she might vomit even at the smell of food and has been exhausted. No headaches, chest pain, or syncope, but she notes shoulder pain that occurs with positional changes. She prefers to sleep with her head slightly elevated, which she has been doing for about two years. No recent intake of high-salt foods, caffeine, or cold medications.  She does not feel that her cellulitis is getting  better. The leg is swollen and painful, with lumps and a rash that has not improved despite two weeks of antibiotics.      ROS-See HPI    Studies Reviewed:       LABS ALT: 27 (03/20/2024) Alkaline phosphatase: 115 (03/20/2024) Total cholesterol: 147 (03/20/2024) HDL: 53 (03/20/2024) Triglycerides: 68 (03/20/2024) LDL: 80 (03/20/2024) Hemoglobin: 10 (03/20/2024) MCV: 74.2 (03/20/2024) Platelet count: 325000 (03/20/2024)  Potassium: 4.1 (04/15/2024) Creatinine: 1.07 (04/15/2024) EGFR: 59 (04/15/2024)  RADIOLOGY Coronary CT: Coronary calcification (09/08/2022)  HYPERTENSION CONTROL Vitals:   06/03/24 1018 06/03/24 1111  BP: (!) 180/100 (!) 190/100    The patient's blood pressure is elevated above target today.  In order to address the patient's elevated BP: Blood pressure will be monitored at home to determine if medication changes need to be made.         Physical Exam:  VS:  BP (!) 190/100   Pulse 84   Ht 5' 5 (1.651 m)   Wt 198 lb (89.8 kg)   SpO2 96%   BMI 32.95 kg/m        Wt Readings from Last 3 Encounters:  06/03/24 198 lb (89.8 kg)  04/14/24 192 lb (87.1 kg)  04/12/24 192 lb 3.2 oz (87.2 kg)    Constitutional:      Appearance: Healthy appearance. Not in distress.  Neck:     Vascular: JVD normal.  Pulmonary:     Breath sounds: Normal breath sounds. No wheezing. No rales.  Cardiovascular:     Normal rate. Regular rhythm.     Murmurs: There is a grade 1/6 early systolic murmur at the URSB.  Edema:    Peripheral edema absent.  Abdominal:     Palpations: Abdomen is soft.       Assessment and Plan:    Assessment & Plan Primary hypertension Recent elevated blood pressures. Previously on spironolactone , which was discontinued due to intolerance. Currently on chlorthalidone  and valsartan , with valsartan  increased to 320 mg daily. Potassium supplementation continued due to prior hypokalemia. BP markedly elevated. She is asymptomatic with no chest  pain, headache, or stroke-like symptoms. Nausea and inability to eat may be related to other medications or conditions. No recent caffeine or decongestant use. No smoking or excessive salt intake. Pain from cellulitis may be contributing somewhat to elevated BP. She has not had any of her medications yet today. She has not started on BiDil  yet.  - Start Bidil  (hydralazine /Isordil ) 20/37.5 mg three times a day - I have asked her to take her first dose this AM. - Continue Valsartan  320 mg once daily, Chlorthalidone  25 mg once daily.  - I have asked her to take her Valsartan  and Chlorthalidone  this morning as well. - Monitor blood pressure at home and report readings via MyChart in the next 2-3 days. - Prescribe Hydralazine  25 mg to take prn systolic blood pressure sustained > 170 mmHg   - Consider carvedilol or amlodipine if blood pressure remains uncontrolled. - Schedule follow-up in hypertension clinic with PharmD in 2-3 weeks. - Follow up with Dr. Michele as planned.  Coronary artery calcification No anginal symptoms.  - Continue Rosuvastatin  10 mg once daily.         Dispo:  Return in about 2 weeks (around 06/17/2024) for follow up with PharmD HTN clinic and in 3 months for planned follow up, w/ Dr. Michele.  Signed, Glendia Ferrier, PA-C

## 2024-06-03 NOTE — ED Provider Notes (Signed)
 [place] EMERGENCY DEPARTMENT AT [facility] Provider Note   CSN: [id number] Arrival date & time: 06/03/24  1406     Patient presents with: Leg Swelling   Evelyn Baker is a 63 y.o. female.   Patient presents with persistent and worsening left lower leg and foot swelling and discomfort despite taking antibiotics for almost 2 weeks for possible cellulitis.  Patient was sent over for ultrasound of the leg given the persistent swelling and other differentials.  No history of blood clots in the deep venous system.  No blood thinning medicines currently.  No blood in the stools or active bleeding.  No chest pain shortness of breath or syncope.  The history is provided by the patient.       Prior to Admission medications   Medication Sig Start Date End Date Taking? Authorizing Provider  albuterol  (VENTOLIN  HFA) 108 (90 Base) MCG/ACT inhaler Inhale 2 puffs into the lungs every 4 (four) hours as needed for wheezing or shortness of breath. 01/22/21   Bethanie Cough, PA-C  azathioprine  (IMURAN ) 100 MG tablet Take 1 tablet (100 mg total) by mouth daily as directed 04/25/24   Dolphus Reiter, MD  benzonatate  (TESSALON ) 100 MG capsule Take 1 capsule (100 mg total) by mouth every 8 (eight) hours. 03/20/24   Geiple, Lashundra Shiveley, PA-C  budesonide -formoterol  (SYMBICORT ) 80-4.5 MCG/ACT inhaler Inhale 2 puffs into the lungs in the morning and at bedtime. 08/12/23   Geronimo Amel, MD  chlorthalidone  (HYGROTON ) 25 MG tablet Take 1 tablet (25 mg total) by mouth daily. 01/14/24   Tolia, Sunit, DO  Cholecalciferol (VITAMIN D-3 PO) Take by mouth.    [provider]  clopidogrel  (PLAVIX ) 75 MG tablet Take 1 tablet (75 mg total) by mouth daily. 06/01/24 06/01/25  Sethi, Pramod S, MD  Cyanocobalamin (VITAMIN B-12 PO) Take by mouth.    [provider]  doxycycline  (VIBRA -TABS) 100 MG tablet Take 1 tablet (100 mg total) by mouth 2 (two) times daily. 05/24/24     doxycycline  (VIBRAMYCIN )  100 MG capsule Take 1 capsule (100 mg total) by mouth 2 (two) times daily. 05/30/24     EPINEPHrine  0.3 mg/0.3 mL IJ SOAJ injection Inject 0.3 mg into the muscle as needed as directed. 02/11/24     ferrous sulfate 325 (65 FE) MG EC tablet 1 tablet Orally daily    [provider]  hydrALAZINE  (APRESOLINE ) 25 MG tablet Take 1 tablet (25 mg) once daily as needed for systolic blood pressure > 170 87/4/74   Weaver, Scott T, PA-C  isosorbide -hydrALAZINE  (BIDIL ) 20-37.5 MG tablet Take 1 tablet by mouth 3 (three) times daily. 06/02/24   Tolia, Sunit, DO  levothyroxine  (SYNTHROID ) 112 MCG tablet Take 1 tablet (112 mcg total) by mouth in the morning on an empty stomach. 11/09/23     potassium chloride  (MICRO-K ) 10 MEQ CR capsule Take 1 capsule (10 mEq total) by mouth 2 (two) times daily. 04/04/24   Tolia, Sunit, DO  promethazine -dextromethorphan (PROMETHAZINE -DM) 6.25-15 MG/5ML syrup Take 5 mLs by mouth 4 (four) times daily as needed for cough. 03/20/24   Geiple, Estus Krakowski, PA-C  rosuvastatin  (CRESTOR ) 10 MG tablet Take 1 tablet (10 mg total) by mouth daily. 04/06/24 07/05/24  Tolia, Sunit, DO  valsartan  (DIOVAN ) 160 MG tablet Take 1 tablet (160 mg total) by mouth 2 (two) times daily. 04/06/24   Tolia, Sunit, DO    Allergies: Shellfish allergy, Avocado, Codeine , Iodinated contrast media, Pineapple, and Penicillins    Review of Systems  Constitutional:  Negative for chills and fever.  HENT:  Negative for congestion.   Eyes:  Negative for visual disturbance.  Respiratory:  Negative for shortness of breath.   Cardiovascular:  Positive for leg swelling. Negative for chest pain.  Gastrointestinal:  Negative for abdominal pain and vomiting.  Genitourinary:  Negative for dysuria and flank pain.  Musculoskeletal:  Negative for back pain, neck pain and neck stiffness.  Skin:  Negative for rash.  Neurological:  Negative for headaches.    Updated Vital Signs BP (!) 152/78 (BP Location: Right Arm)   Pulse 78    Temp 98.3 F (36.8 C) (Oral)   Resp 16   SpO2 96%   Physical Exam Vitals and nursing note reviewed.  Constitutional:      General: She is not in acute distress.    Appearance: She is well-developed.  HENT:     Head: Normocephalic and atraumatic.     Mouth/Throat:     Mouth: Mucous membranes are moist.  Eyes:     General:        Right eye: No discharge.        Left eye: No discharge.     Conjunctiva/sclera: Conjunctivae normal.  Neck:     Trachea: No tracheal deviation.  Cardiovascular:     Rate and Rhythm: Normal rate and regular rhythm.  Pulmonary:     Effort: Pulmonary effort is normal.     Breath sounds: Normal breath sounds.  Abdominal:     General: There is no distension.     Palpations: Abdomen is soft.     Tenderness: There is no abdominal tenderness. There is no guarding.  Musculoskeletal:        General: Swelling and tenderness present.     Cervical back: Normal range of motion and neck supple. No rigidity.     Comments: Patient has moderate edema with mild tenderness left lower extremity and left proximal foot and ankle.  No significant warmth or erythema.  Neurovasc intact.  Compartments soft.  No significant edema in the right lower extremity or tenderness.    Skin:    General: Skin is warm.     Capillary Refill: Capillary refill takes less than 2 seconds.     Findings: No rash.  Neurological:     General: No focal deficit present.     Mental Status: She is alert.     Cranial Nerves: No cranial nerve deficit.  Psychiatric:        Mood and Affect: Mood normal.     (all labs ordered are listed, but only abnormal results are displayed) Labs Reviewed  BASIC METABOLIC PANEL WITH GFR - Abnormal; Notable for the following components:      Result Value   Potassium 3.3 (*)    Glucose, Bld 131 (*)    BUN 46 (*)    Creatinine, Ser 1.80 (*)    GFR, Estimated 32 (*)    All other components within normal limits  CBC WITH DIFFERENTIAL/PLATELET - Abnormal; Notable  for the following components:   RBC 3.11 (*)    Hemoglobin 8.3 (*)    HCT 22.5 (*)    MCV 72.3 (*)    MCHC 36.9 (*)    RDW 16.9 (*)    Lymphs Abs 0.6 (*)    All other components within normal limits  OCCULT BLOOD X 1 CARD TO LAB, STOOL    EKG: None  Radiology: US  Venous Img Lower  Left (DVT Study) Result Date: 06/03/2024 CLINICAL DATA:  left leg edema EXAM: LEFT LOWER EXTREMITY VENOUS DOPPLER ULTRASOUND TECHNIQUE: Gray-scale sonography with graded compression, as well as color Doppler and duplex ultrasound were performed to evaluate the lower extremity deep venous systems from the level of the common femoral vein and including the common femoral, femoral, profunda femoral, popliteal and calf veins including the posterior tibial, peroneal and gastrocnemius veins when visible. The superficial great saphenous vein was also interrogated. Spectral Doppler was utilized to evaluate flow at rest and with distal augmentation maneuvers in the common femoral, femoral and popliteal veins. COMPARISON:  None Available. FINDINGS: Contralateral Common Femoral Vein: Respiratory phasicity is normal and symmetric with the symptomatic side. No evidence of thrombus. Normal compressibility. Common Femoral Vein: Partial compressibility of the common femoral vein. Vascular Doppler flow is present. Saphenofemoral Junction: No evidence of thrombus. Normal compressibility and flow on color Doppler imaging. Profunda Femoral Vein: No evidence of thrombus. Normal compressibility and flow on color Doppler imaging. Femoral Vein: No evidence of thrombus. Normal compressibility, respiratory phasicity and response to augmentation. Popliteal Vein: No evidence of thrombus. Normal compressibility, respiratory phasicity and response to augmentation. Calf Veins: No evidence of thrombus. Normal compressibility and flow on color Doppler imaging. Superficial Great Saphenous Vein: No evidence of thrombus. Normal compressibility. Other  Findings:  None. IMPRESSION: Partial compressibility of the left common femoral vein, worrisome for a nonocclusive thrombus. Electronically Signed   By: Rogelia Myers M.D.   On: 06/03/2024 16:20     Procedures   Medications Ordered in the ED  apixaban  (ELIQUIS ) tablet 10 mg (has no administration in time range)  acetaminophen  (TYLENOL ) tablet 1,000 mg (1,000 mg Oral Given 06/03/24 1656)  sodium chloride  0.9 % bolus 500 mL ( Intravenous Stopped 06/03/24 1752)                                    Medical Decision Making Amount and/or Complexity of Data Reviewed Labs: ordered.  Risk OTC drugs. Prescription drug management. Decision regarding hospitalization.   Patient presents with persistent left lower extremity swelling and discomfort differential includes lymphedema, acute venous thrombosis in the deep system, post cellulitis/chronic skin changes, other.  Patient has no signs or symptoms of acute pulmonary embolism or other acute heart failure.  No known congestive heart failure and no blood thinners currently.  Blood work independent reviewed showing acute renal failure 1.8 creatinine, previous reviewed previous creatinine 1.072 months ago.  Patient has had decreased appetite likely side effect from possible cellulitis and been on antibiotics for most 2 weeks.  Plan for IV fluid bolus, Hemoccult with drop in hemoglobin from 10-8.3 in the last 10 weeks.  Patient denies any active bleeding.  Hemoccult ordered.  Heme negative brown stool. Discussed with pharmacy with concern for DVT recommends Eliquis  10 mg.  Discussed case with hospitalist and they agree with observation/admission for AKI, DVT concerns and anemia.  Patient comfortable plan.  IV fluid bolus given in the ER.     Final diagnoses:  Leg edema, left  Acute renal insufficiency  Acute deep vein thrombosis (DVT) of left femoral vein [facility])    ED Discharge Orders     None          Tonia Chew, MD 06/03/24  1911

## 2024-06-04 ENCOUNTER — Inpatient Hospital Stay (HOSPITAL_COMMUNITY)

## 2024-06-04 DIAGNOSIS — M351 Other overlap syndromes: Secondary | ICD-10-CM

## 2024-06-04 DIAGNOSIS — I82412 Acute embolism and thrombosis of left femoral vein: Secondary | ICD-10-CM

## 2024-06-04 DIAGNOSIS — E785 Hyperlipidemia, unspecified: Secondary | ICD-10-CM

## 2024-06-04 DIAGNOSIS — E876 Hypokalemia: Secondary | ICD-10-CM

## 2024-06-04 DIAGNOSIS — I10 Essential (primary) hypertension: Secondary | ICD-10-CM | POA: Diagnosis not present

## 2024-06-04 DIAGNOSIS — E039 Hypothyroidism, unspecified: Secondary | ICD-10-CM

## 2024-06-04 DIAGNOSIS — N179 Acute kidney failure, unspecified: Secondary | ICD-10-CM

## 2024-06-04 DIAGNOSIS — D509 Iron deficiency anemia, unspecified: Secondary | ICD-10-CM

## 2024-06-04 DIAGNOSIS — Z8673 Personal history of transient ischemic attack (TIA), and cerebral infarction without residual deficits: Secondary | ICD-10-CM

## 2024-06-04 DIAGNOSIS — J45909 Unspecified asthma, uncomplicated: Secondary | ICD-10-CM | POA: Diagnosis not present

## 2024-06-04 LAB — URINALYSIS, ROUTINE W REFLEX MICROSCOPIC
Bilirubin Urine: NEGATIVE
Glucose, UA: NEGATIVE mg/dL
Ketones, ur: NEGATIVE mg/dL
Nitrite: NEGATIVE
Protein, ur: 100 mg/dL — AB
RBC / HPF: >50 RBC/hpf (ref 0–5)
Specific Gravity, Urine: 1.011 (ref 1.005–1.030)
pH: 5 (ref 5.0–8.0)

## 2024-06-04 LAB — IRON AND TIBC
Iron: 23 ug/dL — ABNORMAL LOW (ref 28–170)
Saturation Ratios: 14 % (ref 10.4–31.8)
TIBC: 165 ug/dL — ABNORMAL LOW (ref 250–450)
UIBC: 142 ug/dL

## 2024-06-04 LAB — CBC
HCT: 21.5 % — ABNORMAL LOW (ref 36.0–46.0)
Hemoglobin: 7.8 g/dL — ABNORMAL LOW (ref 12.0–15.0)
MCH: 27.3 pg (ref 26.0–34.0)
MCHC: 36.3 g/dL — ABNORMAL HIGH (ref 30.0–36.0)
MCV: 75.2 fL — ABNORMAL LOW (ref 80.0–100.0)
Platelets: 185 K/uL (ref 150–400)
RBC: 2.86 MIL/uL — ABNORMAL LOW (ref 3.87–5.11)
RDW: 16.8 % — ABNORMAL HIGH (ref 11.5–15.5)
WBC: 5.1 K/uL (ref 4.0–10.5)
nRBC: 0 % (ref 0.0–0.2)

## 2024-06-04 LAB — HIV ANTIBODY (ROUTINE TESTING W REFLEX): HIV Screen 4th Generation wRfx: NONREACTIVE

## 2024-06-04 LAB — SODIUM, URINE, RANDOM: Sodium, Ur: 59 mmol/L

## 2024-06-04 LAB — BASIC METABOLIC PANEL WITH GFR
Anion gap: 10 (ref 5–15)
BUN: 47 mg/dL — ABNORMAL HIGH (ref 8–23)
CO2: 21 mmol/L — ABNORMAL LOW (ref 22–32)
Calcium: 8.4 mg/dL — ABNORMAL LOW (ref 8.9–10.3)
Chloride: 102 mmol/L (ref 98–111)
Creatinine, Ser: 1.72 mg/dL — ABNORMAL HIGH (ref 0.44–1.00)
GFR, Estimated: 33 mL/min — ABNORMAL LOW (ref >60)
Glucose, Bld: 129 mg/dL — ABNORMAL HIGH (ref 70–99)
Potassium: 4.3 mmol/L (ref 3.5–5.1)
Sodium: 133 mmol/L — ABNORMAL LOW (ref 135–145)

## 2024-06-04 LAB — CREATININE, URINE, RANDOM: Creatinine, Urine: 83 mg/dL

## 2024-06-04 LAB — VITAMIN B12: Vitamin B-12: 1972 pg/mL — ABNORMAL HIGH (ref 180–914)

## 2024-06-04 LAB — APTT: aPTT: 110 s — ABNORMAL HIGH (ref 24–36)

## 2024-06-04 LAB — FOLATE: Folate: 7.5 ng/mL (ref >5.9)

## 2024-06-04 LAB — FERRITIN: Ferritin: 473 ng/mL — ABNORMAL HIGH (ref 11–307)

## 2024-06-04 MED ORDER — FERROUS SULFATE 325 (65 FE) MG PO TABS
325.0000 mg | ORAL_TABLET | Freq: Every day | ORAL | Status: DC
  Administered 2024-06-05 – 2024-06-10 (×6): 325 mg via ORAL
  Filled 2024-06-04 (×6): qty 1

## 2024-06-04 MED ORDER — LACTATED RINGERS IV SOLN: INTRAVENOUS | Status: AC

## 2024-06-04 NOTE — Progress Notes (Signed)
 PROGRESS NOTE    EARLEE Baker  FMW:[id number] DOB: 12-01-1960 DOA: 06/03/2024 PCP: Katina Pfeiffer, PA-C    Chief Complaint  Patient presents with   Leg Swelling    Brief Narrative:  Evelyn Baker is a 63 y.o. 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    Assessment & Plan:   Principal Problem:   Acute deep vein thrombosis (DVT) of left femoral vein (HCC) Active Problems:   AKI (acute kidney injury)   Microcytic anemia   Mixed connective tissue disease   Hypokalemia   Hypertension   Hypothyroidism   Hyperlipidemia   Asthma   History of CVA (cerebrovascular accident)  #1 acute left, femoral vein DVT -Patient denies any prior history of DVT, no recent surgeries.  Patient stated had a 4-hour car ride recently. - Patient noted this morning with some complaints of shortness of breath per RN. - Check VQ scan. - Continue IV heparin  and can likely transition to oral DOAC in the next 24 to 48 hours if continued improvement with renal function.  2.  AKI -Patient noted to have a creatinine of 1.80 on admission compared to previous baseline of approximately 1.0-1.1. - Per chart review patient with a creatinine 1.07 on 04/15/2024. - Etiology of AKI likely multifactorial secondary to prerenal azotemia due to decreased oral intake in the setting of ARB. - Urinalysis and urine studies pending. - Hold ARB - Gentle hydration for the next 24 hours. - Follow-up.  3.  Microcytic anemia  - Hemoglobin noted to 8.3 on admission with prior hemoglobin of 10.0 03/20/2024 and prior to that 8.5 on 03/15/2024. - Patient with no overt bleeding. - FOBT negative. - Anemia panel with iron  level of 23, TIBC of 165, ferritin of 473, folate of 7.5. - Hemoglobin currently at 7.8 today, partly dilutional. - Patient is a [employer] and as such does not  accept blood products. - Patient agreeable to IV iron  if needed.  4.  Hypokalemia  - Repleted.  5.  Hypertension -Continue BiDil  and chlorthalidone . - Continue to hold ARB secondary to AKI.  6.  History of CVA -Plavix  on hold as patient currently on full dose anticoagulation. - Continue rosuvastatin .  7.  Hypothyroidism -Synthroid .  8.  Hyperlipidemia -Rosuvastatin .  9.  Mixed connective tissue disease/osteoarthritis -Patient being followed by rheumatology, Dr. Barnie Gentry and managed on Imuran  in the outpatient setting. - Patient states Imuran  held recently while on antibiotics. - Outpatient follow-up with rheumatology.  10.  Asthma -Stable. - Continue Symbicort , albuterol  as needed.   DVT prophylaxis: Heparin  Code Status: Full Family Communication: Updated patient, husband at bedside. Disposition: TBD  Status is: Inpatient Remains inpatient appropriate because: Severity of illness   Consultants:  None  Procedures: Lower extremity Dopplers 06/03/2024 Chest x-ray 06/04/2024 Renal ultrasound pending 06/04/2024 VQ scan pending.  Antimicrobials:  Anti-infectives (From admission, onward)    None         Subjective: Patient laying in bed, husband at bedside.  Patient advocates also at bedside.  Patient denies any chest pain.  Patient denies any significant shortness of breath on ambulation although per RN patient noted with shortness of breath on ambulation to the restroom.  Patient denies any overt bleeding.  Objective: Vitals:   06/03/24 2032 06/04/24 0007 06/04/24 0355 06/04/24 0833  BP: (!) 157/93 (!) 158/92 (!) 157/85 (!) 148/81  Pulse:  77 93 84 88  Resp: 18 (!) 22 18 18   Temp: 98.3 F (36.8 C) 98.3 F (36.8 C) 98.5 F (36.9 C) 98.5 F (36.9 C)  TempSrc:      SpO2: 98% 99% 99% 96%    Intake/Output Summary (Last 24 hours) at 06/04/2024 1239 Last data filed at 06/03/2024 1757 Gross per 24 hour  Intake 504.57 ml  Output --  Net 504.57 ml    There were no vitals filed for this visit.  Examination:  General exam: NAD Respiratory system: CTAB.  No wheezes, no crackles, no rhonchi.  Fair air movement.  Speaking in full sentences.  Cardiovascular system: S1 & S2 heard, RRR. No JVD, murmurs, rubs, gallops or clicks.  1+ left lower extremity edema.  Trace to 1+ right lower extremity edema Gastrointestinal system: Abdomen is nondistended, soft and nontender. No organomegaly or masses felt. Normal bowel sounds heard. Central nervous system: Alert and oriented. No focal neurological deficits. Extremities: 1+ left lower extremity edema.  Trace to 1+ right lower extremity edema.  Symmetric 5 x 5 power. Skin: No rashes, lesions or ulcers Psychiatry: Judgement and insight appear normal. Mood & affect appropriate.     Data Reviewed: I have personally reviewed following labs and imaging studies  CBC: Recent Labs  Lab 06/03/24 1423 06/04/24 0017  WBC 7.2 5.1  NEUTROABS 6.0  --   HGB 8.3* 7.8*  HCT 22.5* 21.5*  MCV 72.3* 75.2*  PLT 218 185    Basic Metabolic Panel: Recent Labs  Lab 06/03/24 1423 06/04/24 0017  NA 135 133*  K 3.3* 4.3  CL 99 102  CO2 24 21*  GLUCOSE 131* 129*  BUN 46* 47*  CREATININE 1.80* 1.72*  CALCIUM  9.1 8.4*    GFR: Estimated Creatinine Clearance: 38 mL/min (A) (by C-G formula based on SCr of 1.72 mg/dL (H)).  Liver Function Tests: No results for input(s): AST, ALT, ALKPHOS, BILITOT, PROT, ALBUMIN  in the last 168 hours.  CBG: No results for input(s): GLUCAP in the last 168 hours.   No results found for this or any previous visit (from the past 240 hours).       Radiology Studies: US  Venous Img Lower  Left (DVT Study) Result Date: 06/03/2024 CLINICAL DATA:  left leg edema EXAM: LEFT LOWER EXTREMITY VENOUS DOPPLER ULTRASOUND TECHNIQUE: Gray-scale sonography with graded compression, as well as color Doppler and duplex ultrasound were performed to evaluate the lower  extremity deep venous systems from the level of the common femoral vein and including the common femoral, femoral, profunda femoral, popliteal and calf veins including the posterior tibial, peroneal and gastrocnemius veins when visible. The superficial great saphenous vein was also interrogated. Spectral Doppler was utilized to evaluate flow at rest and with distal augmentation maneuvers in the common femoral, femoral and popliteal veins. COMPARISON:  None Available. FINDINGS: Contralateral Common Femoral Vein: Respiratory phasicity is normal and symmetric with the symptomatic side. No evidence of thrombus. Normal compressibility. Common Femoral Vein: Partial compressibility of the common femoral vein. Vascular Doppler flow is present. Saphenofemoral Junction: No evidence of thrombus. Normal compressibility and flow on color Doppler imaging. Profunda Femoral Vein: No evidence of thrombus. Normal compressibility and flow on color Doppler imaging. Femoral Vein: No evidence of thrombus. Normal compressibility, respiratory phasicity and response to augmentation. Popliteal Vein: No evidence of thrombus. Normal compressibility, respiratory phasicity and response to augmentation. Calf Veins: No evidence of thrombus. Normal compressibility and flow on color Doppler imaging. Superficial Great Saphenous Vein: No evidence of  thrombus. Normal compressibility. Other Findings:  None. IMPRESSION: Partial compressibility of the left common femoral vein, worrisome for a nonocclusive thrombus. Electronically Signed   By: Rogelia Myers M.D.   On: 06/03/2024 16:20        Scheduled Meds:  chlorthalidone   25 mg Oral Daily   [START ON 06/05/2024] ferrous sulfate   325 mg Oral Q breakfast   fluticasone  furoate-vilanterol  1 puff Inhalation Daily   isosorbide -hydrALAZINE   1 tablet Oral TID   levothyroxine   112 mcg Oral Q0600   rosuvastatin   10 mg Oral Daily   sodium chloride  flush  3 mL Intravenous Q12H   Continuous  Infusions:  heparin  1,350 Units/hr (06/04/24 0847)   lactated ringers  100 mL/hr at 06/04/24 0844     LOS: 1 day    Time spent: 40 minutes    Toribio Hummer, MD [facility]   To contact the attending provider between 7A-7P or the covering provider during after hours 7P-7A, please log into the web site [email or web address] and access using universal [facility] password for that web site. If you do not have the password, please call the hospital operator.  06/04/2024, 12:39 PM

## 2024-06-04 NOTE — Plan of Care (Signed)

## 2024-06-04 NOTE — Plan of Care (Signed)
   Problem: Education: Goal: Knowledge of General Education information will improve Description: Including pain rating scale, medication(s)/side effects and non-pharmacologic comfort measures Outcome: Progressing   Problem: Clinical Measurements: Goal: Ability to maintain clinical measurements within normal limits will improve Outcome: Progressing

## 2024-06-04 NOTE — Progress Notes (Addendum)
 PHARMACY - ANTICOAGULATION CONSULT NOTE  Pharmacy Consult for heparin   Indication: acute DVT  Allergies  Allergen Reactions   Shellfish Allergy Anaphylaxis and Swelling   Avocado Swelling    Lips   Codeine  Other (See Comments)    Constipation   Iodinated Contrast Media Other (See Comments)   Pineapple Swelling    Lip swelling    Penicillins Rash    Reaction: Childhood    Patient Measurements: heparin  dosing weight = 77 kg    Vital Signs: Temp: 98.8 F (37.1 C) (12/06 1321) BP: 180/100 (12/06 1321) Pulse Rate: 84 (12/06 1321)  Labs: Recent Labs    06/03/24 1423 06/04/24 0017  HGB 8.3* 7.8*  HCT 22.5* 21.5*  PLT 218 185  CREATININE 1.80* 1.72*    Estimated Creatinine Clearance: 38 mL/min (A) (by C-G formula based on SCr of 1.72 mg/dL (H)).   Medical History: Past Medical History:  Diagnosis Date   Asthma    Depression    Dyspnea    Hyperlipidemia    Hypertension    Hypothyroidism    Sciatic nerve disease    Thyroid  disease     Medications:  - hx CVA on plavix  PTA  Assessment: Patient is a 63 y.o Jehov[employer]'s Witness  F who presented to the ED on 06/04/24 with c/o left lower leg and foot pain/swelling.  Left LE doppler on 06/03/24 showed partial compressibility of the left common femoral vein, worrisome for a nonocclusive thrombus. She received Eliquis  10 mg x1 dose on 06/03/24 at 7:34 PM.  Anticoagulation changed to heparin  drip in case anticoagulant needs to be stopped due to bleeding or severe anemia and reversal is needed.  Today, 06/04/2024: - First aPTT level is slightly elevated at 110 sec. - Patient's RN Georgia) reported blood oozing from IV site at 6:40 PM. IV access came out at 6:56pm while Burnard tried to bandage the IV site.   - New shift RN Gretta) reported at 7:45pm that a new IV site has been placed on the right forearm. Bleeding from the old IV site has stopped and now there's a bruise in that area (no bigger than a quarter).  OK with Lynwood Sieving (NP) to resume heparin  drip back.  Goal of Therapy:  Heparin  level 0.3-0.7 units/ml aPTT 66-102 seconds Monitor platelets by anticoagulation protocol: Yes   Plan:  - resume heparin  drip at 1250 units/hr - check 8 hr aPPT and heparin  level - monitor cbc closely and s/sx bleeding   Ceaser Ebeling P 06/04/2024,5:44 PM

## 2024-06-05 ENCOUNTER — Inpatient Hospital Stay (HOSPITAL_COMMUNITY)

## 2024-06-05 DIAGNOSIS — M351 Other overlap syndromes: Secondary | ICD-10-CM | POA: Diagnosis not present

## 2024-06-05 DIAGNOSIS — I10 Essential (primary) hypertension: Secondary | ICD-10-CM | POA: Diagnosis not present

## 2024-06-05 DIAGNOSIS — J45909 Unspecified asthma, uncomplicated: Secondary | ICD-10-CM | POA: Diagnosis not present

## 2024-06-05 DIAGNOSIS — I82412 Acute embolism and thrombosis of left femoral vein: Secondary | ICD-10-CM | POA: Diagnosis not present

## 2024-06-05 LAB — BASIC METABOLIC PANEL WITH GFR
Anion gap: 10 (ref 5–15)
BUN: 37 mg/dL — ABNORMAL HIGH (ref 8–23)
CO2: 21 mmol/L — ABNORMAL LOW (ref 22–32)
Calcium: 8.3 mg/dL — ABNORMAL LOW (ref 8.9–10.3)
Chloride: 103 mmol/L (ref 98–111)
Creatinine, Ser: 1.41 mg/dL — ABNORMAL HIGH (ref 0.44–1.00)
GFR, Estimated: 42 mL/min — ABNORMAL LOW (ref >60)
Glucose, Bld: 100 mg/dL — ABNORMAL HIGH (ref 70–99)
Potassium: 3.6 mmol/L (ref 3.5–5.1)
Sodium: 133 mmol/L — ABNORMAL LOW (ref 135–145)

## 2024-06-05 LAB — URINE CULTURE: Culture: NO GROWTH

## 2024-06-05 LAB — CBC WITH DIFFERENTIAL/PLATELET
Abs Immature Granulocytes: 0.03 K/uL (ref 0.00–0.07)
Basophils Absolute: 0 K/uL (ref 0.0–0.1)
Basophils Relative: 0 %
Eosinophils Absolute: 0.1 K/uL (ref 0.0–0.5)
Eosinophils Relative: 1 %
HCT: 19.7 % — ABNORMAL LOW (ref 36.0–46.0)
Hemoglobin: 7.2 g/dL — ABNORMAL LOW (ref 12.0–15.0)
Immature Granulocytes: 0 %
Lymphocytes Relative: 10 %
Lymphs Abs: 0.8 K/uL (ref 0.7–4.0)
MCH: 26.6 pg (ref 26.0–34.0)
MCHC: 36.5 g/dL — ABNORMAL HIGH (ref 30.0–36.0)
MCV: 72.7 fL — ABNORMAL LOW (ref 80.0–100.0)
Monocytes Absolute: 0.7 K/uL (ref 0.1–1.0)
Monocytes Relative: 10 %
Neutro Abs: 5.8 K/uL (ref 1.7–7.7)
Neutrophils Relative %: 79 %
Platelets: 163 K/uL (ref 150–400)
RBC: 2.71 MIL/uL — ABNORMAL LOW (ref 3.87–5.11)
RDW: 16.7 % — ABNORMAL HIGH (ref 11.5–15.5)
WBC: 7.4 K/uL (ref 4.0–10.5)
nRBC: 0 % (ref 0.0–0.2)

## 2024-06-05 LAB — HEPARIN LEVEL (UNFRACTIONATED): Heparin Unfractionated: >1.1 [IU]/mL — ABNORMAL HIGH (ref 0.30–0.70)

## 2024-06-05 LAB — APTT
aPTT: 116 s — ABNORMAL HIGH (ref 24–36)
aPTT: 75 s — ABNORMAL HIGH (ref 24–36)
aPTT: 63 s — ABNORMAL HIGH (ref 24–36)

## 2024-06-05 MED ORDER — ACETAMINOPHEN 500 MG PO TABS
1000.0000 mg | ORAL_TABLET | Freq: Three times a day (TID) | ORAL | Status: DC
  Administered 2024-06-05 – 2024-06-11 (×15): 1000 mg via ORAL
  Filled 2024-06-05 (×21): qty 2

## 2024-06-05 MED ORDER — OXYCODONE HCL 5 MG PO TABS
2.5000 mg | ORAL_TABLET | Freq: Once | ORAL | Status: AC | PRN
  Administered 2024-06-05: 2.5 mg via ORAL
  Filled 2024-06-05: qty 1

## 2024-06-05 MED ORDER — SODIUM CHLORIDE 0.9 % IV SOLN: INTRAVENOUS | Status: AC

## 2024-06-05 MED ORDER — ISOSORB DINITRATE-HYDRALAZINE 20-37.5 MG PO TABS
2.0000 | ORAL_TABLET | Freq: Three times a day (TID) | ORAL | Status: DC
  Administered 2024-06-05 – 2024-06-08 (×12): 2 via ORAL
  Filled 2024-06-05 (×14): qty 2

## 2024-06-05 NOTE — Plan of Care (Signed)

## 2024-06-05 NOTE — Progress Notes (Signed)
 PHARMACY - ANTICOAGULATION CONSULT NOTE  Pharmacy Consult for heparin   Indication: acute DVT  Allergies  Allergen Reactions   Shellfish Allergy Anaphylaxis and Swelling   Avocado Swelling    Lips   Codeine  Other (See Comments)    Constipation   Iodinated Contrast Media Other (See Comments)   Pineapple Swelling    Lip swelling    Penicillins Rash    Reaction: Childhood    Patient Measurements: heparin  dosing weight = 77 kg Height: 5' 5 (165.1 cm) Weight: 89.8 kg (198 lb) IBW/kg (Calculated) : 57 HEPARIN  DW (KG): 76.8  Vital Signs: Temp: 98.7 F (37.1 C) (12/07 1934) Temp Source: Oral (12/07 1934) BP: 182/92 (12/07 1934) Pulse Rate: 106 (12/07 1934)  Labs: Recent Labs    06/03/24 1423 06/04/24 0017 06/04/24 1638 06/05/24 0424 06/05/24 1438 06/05/24 2130  HGB 8.3* 7.8*  --  7.2*  --   --   HCT 22.5* 21.5*  --  19.7*  --   --   PLT 218 185  --  163  --   --   APTT  --   --    < > 116* 75* 63*  HEPARINUNFRC  --   --   --  >1.10*  --   --   CREATININE 1.80* 1.72*  --  1.41*  --   --    < > = values in this interval not displayed.    Estimated Creatinine Clearance: 46.4 mL/min (A) (by C-G formula based on SCr of 1.41 mg/dL (H)).   Medical History: Past Medical History:  Diagnosis Date   Asthma    Depression    Dyspnea    Hyperlipidemia    Hypertension    Hypothyroidism    Sciatic nerve disease    Thyroid  disease     Medications:  -Hx CVA on plavix  PTA  Assessment: Patient is a 63 y.o F who presented to the ED on 06/04/24 with c/o left lower leg and foot pain/swelling. Left LE doppler on 06/03/24 showed partial compressibility of the left common femoral vein, worrisome for a nonocclusive thrombus. She received Eliquis  10 mg x1 dose on 06/03/24 at 7:34 PM.  Anticoagulation changed to heparin  infusion in the acute period given some concern for AKI and hemoglobin trending down. Of note, patient is a [employer] and does not accept blood  products.  Significant events: -12/6 aPTT 110 - elevated. RN reports blood oozing from IV site at 6:40 pm. IV access came out at 6:56 pm while trying to bandage the IV site. New IV site later established and heparin  resumed at lower rate around 8:30 pm per ok with [facility] NP coverage. Previous IV site without bleeding, noted bruise to area.  - V/Q scan ordered 12/6 given some concern for shortness of breath -- not done yet    Today, 06/05/2024:  - aPTT level collected at 21:30 is SUB-therapeutic at 63 secs after rate decreased to 1000 units/hr this morning  - hgb down 7.2; plts 163K  - per pt's RN, no further bleeding or infusion related concerns noted  Goal of Therapy:  Heparin  level 0.3-0.7 units/ml aPTT 66-102 seconds Monitor platelets by anticoagulation protocol: Yes   Plan:  -Increase heparin  infusion slightly to 1100 units/hr -Check aPTT & heparin  in 8h -Monitor CBC closely and s/sx bleeding    Rosaline Millet, PharmD, BCPS Clinical Pharmacist 06/05/2024 9:58 PM

## 2024-06-05 NOTE — Progress Notes (Addendum)
 PHARMACY - ANTICOAGULATION CONSULT NOTE  Pharmacy Consult for heparin   Indication: acute DVT  Allergies  Allergen Reactions   Shellfish Allergy Anaphylaxis and Swelling   Avocado Swelling    Lips   Codeine  Other (See Comments)    Constipation   Iodinated Contrast Media Other (See Comments)   Pineapple Swelling    Lip swelling    Penicillins Rash    Reaction: Childhood    Patient Measurements: heparin  dosing weight = 77 kg Height: 5' 5 (165.1 cm) Weight: 89.8 kg (198 lb) IBW/kg (Calculated) : 57 HEPARIN  DW (KG): 76.8  Vital Signs: Temp: 99.8 F (37.7 C) (12/07 1250) Temp Source: Oral (12/07 1028) BP: 196/99 (12/07 1256) Pulse Rate: 106 (12/07 1250)  Labs: Recent Labs    06/03/24 1423 06/04/24 0017 06/04/24 1638 06/05/24 0424 06/05/24 1438  HGB 8.3* 7.8*  --  7.2*  --   HCT 22.5* 21.5*  --  19.7*  --   PLT 218 185  --  163  --   APTT  --   --  110* 116* 75*  HEPARINUNFRC  --   --   --  >1.10*  --   CREATININE 1.80* 1.72*  --  1.41*  --     Estimated Creatinine Clearance: 46.4 mL/min (A) (by C-G formula based on SCr of 1.41 mg/dL (H)).   Medical History: Past Medical History:  Diagnosis Date   Asthma    Depression    Dyspnea    Hyperlipidemia    Hypertension    Hypothyroidism    Sciatic nerve disease    Thyroid  disease     Medications:  -Hx CVA on plavix  PTA  Assessment: Patient is a 63 y.o F who presented to the ED on 06/04/24 with c/o left lower leg and foot pain/swelling. Left LE doppler on 06/03/24 showed partial compressibility of the left common femoral vein, worrisome for a nonocclusive thrombus. She received Eliquis  10 mg x1 dose on 06/03/24 at 7:34 PM.  Anticoagulation changed to heparin  infusion in the acute period given some concern for AKI and hemoglobin trending down. Of note, patient is a [employer] and does not accept blood products.  Significant events: -12/6 aPTT 110 - elevated. RN reports blood oozing from IV site at 6:40  pm. IV access came out at 6:56 pm while trying to bandage the IV site. New IV site later established and heparin  resumed at lower rate around 8:30 pm per ok with [facility] NP coverage. Previous IV site without bleeding, noted bruise to area.  - V/Q scan ordered 12/6 given some concern for shortness of breath -- not done yet    Today, 06/05/2024: - aPTT level collected at 2:28 PM is therapeutic at 75 secs after rate decreased to 1000 units/hr this morning  - hgb down 7.2; plts 163K  - per pt's RN, no further bleeding or infusion related concerns noted  Goal of Therapy:  Heparin  level 0.3-0.7 units/ml aPTT 66-102 seconds Monitor platelets by anticoagulation protocol: Yes   Plan:  -Continue heparin  infusion at 1000 units/hr - will recheck aPTT level at 9pm to confirm level is still within range before changing to daily monitoring. -Monitor CBC closely and s/sx bleeding    Osie Iantha SQUIBB, PharmD, BCPS Clinical Pharmacist 06/05/2024 3:32 PM

## 2024-06-05 NOTE — Progress Notes (Addendum)
 Pt started to have SOB and a dry cough as well as pain. Pt is self administering inhaler, pt was given permission from the doctor on day shift per pt report. Upon auscultation, pt right side sounded slightly diminished. Will notify doctor and continue to monitor.

## 2024-06-05 NOTE — Progress Notes (Addendum)
 PROGRESS NOTE    Evelyn Baker  FMW:[id number] DOB: May 25, 1961 DOA: 06/03/2024 PCP: Katina Pfeiffer, PA-C    Chief Complaint  Patient presents with   Leg Swelling    Brief Narrative:  Evelyn Baker is a 63 y.o. 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    Assessment & Plan:   Principal Problem:   Acute deep vein thrombosis (DVT) of left femoral vein (HCC) Active Problems:   AKI (acute kidney injury)   Microcytic anemia   Mixed connective tissue disease   Hypokalemia   Hypertension   Hypothyroidism   Hyperlipidemia   Asthma   History of CVA (cerebrovascular accident)  #1 acute left, femoral vein DVT -Patient denies any prior history of DVT, no recent surgeries.  Patient stated had a 4-hour car ride recently. - Patient noted the morning of 06/04/2024 with complaints of shortness of breath per RN.   - Patient denies any further significant shortness of breath.  - VQ scan pending. - Continue IV heparin  and can likely transition to oral DOAC in the next 24 to 48 hours if continued improvement with renal function. - Keep left lower extremity elevated.  2.  AKI -Patient noted to have a creatinine of 1.80 on admission compared to previous baseline of approximately 1.0-1.1. - Per chart review patient with a creatinine 1.07 on 04/15/2024. - Etiology of AKI likely multifactorial secondary to prerenal azotemia due to decreased oral intake in the setting of ARB. - Urinalysis with 100 protein, small leukocytes, nitrite negative, rare bacteria, 11-20 WBCs.   - Urine sodium at 59, urine creatinine at 83.  -Renal function improving creatinine down to 1.41 today from 1.80 on admission -Continue to hold ARB. -Gentle hydration for another 24 hours. - Follow.  3.  Microcytic anemia  - Hemoglobin noted to 8.3 on admission with prior hemoglobin of  10.0 03/20/2024 and prior to that 8.5 on 03/15/2024. - Patient with no overt bleeding. - FOBT negative. - Anemia panel with iron  level of 23, TIBC of 165, ferritin of 473, folate of 7.5. - Hemoglobin currently at 7.2 today, partly dilutional. - Patient is a [employer] and as such does not accept blood products. - Patient agreeable to IV iron  if needed.  4.  Hypokalemia  - Repleted.  5.  Hypertension - BP noted to be elevated this morning.   - ARB on hold secondary to AKI.   - Increase BiDil  to 2 tabs 3 times daily.   - Continue chlorthalidone .   6.  History of CVA -Plavix  on hold as patient currently on full dose anticoagulation. - Continue rosuvastatin .  7.  Hypothyroidism -Synthroid .  8.  Hyperlipidemia -Rosuvastatin .  9.  Mixed connective tissue disease/osteoarthritis -Patient being followed by rheumatology, Dr. Barnie Gentry and managed on Imuran  in the outpatient setting. - Patient states Imuran  held recently while on antibiotics. - Outpatient follow-up with rheumatology.  10.  Asthma -Stable. - Symbicort , albuterol  as needed.    DVT prophylaxis: Heparin  Code Status: Full Family Communication: Updated patient, husband at bedside. Disposition: TBD  Status is: Inpatient Remains inpatient appropriate because: Severity of illness   Consultants:  None  Procedures: Lower extremity Dopplers 06/03/2024 Chest x-ray 06/04/2024 Renal ultrasound pending 06/04/2024 VQ scan pending.  Antimicrobials:  Anti-infectives (From admission, onward)    None         Subjective: Patient laying in  bed.  Some complaints of left lower extremity pain and knots.  Denies any chest pain or shortness of breath.  No cough.  Noted to have some oozing from IV site yesterday evening which has since resolved after IV came out.  Patient denies any further bleeding.  Husband at bedside.   Objective: Vitals:   06/04/24 1321 06/04/24 2019 06/05/24 0406 06/05/24 1028  BP: (!) 180/100  (!) 174/94 (!) 181/112 (!) 181/112  Pulse: 84 (!) 105 99 99  Resp: 15 18 18 18   Temp: 98.8 F (37.1 C) 99.3 F (37.4 C) 99.4 F (37.4 C) 99.4 F (37.4 C)  TempSrc:    Oral  SpO2: 100% 98% 98%   Weight:    89.8 kg  Height:    5' 5 (1.651 m)   No intake or output data in the 24 hours ending 06/05/24 1204  Filed Weights   06/05/24 1028  Weight: 89.8 kg    Examination:  General exam: NAD. Respiratory system: Lungs clear to auscultation bilaterally.  No wheezes, no crackles, no rhonchi.  Fair air movement.  Speaking in full sentences.  Cardiovascular system: Regular rate rhythm no murmurs rubs or gallops.  No JVD.  Trace to 1+ left lower extremity edema.  Gastrointestinal system: Abdomen is soft, nontender, nondistended, positive bowel sounds.  No rebound.  No guarding.  Central nervous system: Alert and oriented. No focal neurological deficits. Extremities: Trace to 1+ left lower extremity edema.  Symmetric 5 x 5 power. Skin: No rashes, lesions or ulcers Psychiatry: Judgement and insight appear normal. Mood & affect appropriate.     Data Reviewed: I have personally reviewed following labs and imaging studies  CBC: Recent Labs  Lab 06/03/24 1423 06/04/24 0017 06/05/24 0424  WBC 7.2 5.1 7.4  NEUTROABS 6.0  --  5.8  HGB 8.3* 7.8* 7.2*  HCT 22.5* 21.5* 19.7*  MCV 72.3* 75.2* 72.7*  PLT 218 185 163    Basic Metabolic Panel: Recent Labs  Lab 06/03/24 1423 06/04/24 0017 06/05/24 0424  NA 135 133* 133*  K 3.3* 4.3 3.6  CL 99 102 103  CO2 24 21* 21*  GLUCOSE 131* 129* 100*  BUN 46* 47* 37*  CREATININE 1.80* 1.72* 1.41*  CALCIUM  9.1 8.4* 8.3*    GFR: Estimated Creatinine Clearance: 46.4 mL/min (A) (by C-G formula based on SCr of 1.41 mg/dL (H)).  Liver Function Tests: No results for input(s): AST, ALT, ALKPHOS, BILITOT, PROT, ALBUMIN  in the last 168 hours.  CBG: No results for input(s): GLUCAP in the last 168 hours.   No results found for  this or any previous visit (from the past 240 hours).       Radiology Studies: US  RENAL Result Date: 06/04/2024 EXAM: US  Retroperitoneum Complete, Renal. 06/04/2024 05:56:29 PM TECHNIQUE: Real-time ultrasonography of the retroperitoneum renal was performed. COMPARISON: None available CLINICAL HISTORY: Acute renal failure. FINDINGS: FINDINGS: RIGHT KIDNEY/URETER: Right kidney measures 12.5 x 4.9 x 5.7 cm. Mildly increased echotexture. No hydronephrosis. No calculus. No mass. LEFT KIDNEY/URETER: Left kidney measures 11.5 x 5.4 x 4.5 cm. Mildly increased echotexture. No hydronephrosis. No calculus. No mass. BLADDER: Unremarkable appearance of the bladder. IMPRESSION: 1. Mildly increased renal cortical echotexture bilaterally, which can be seen with medical renal disease or acute kidney injury. Electronically signed by: Franky Crease MD 06/04/2024 07:41 PM EST RP Workstation: [id number]   DG Chest Port 1 View Result Date: 06/04/2024 EXAM: 1 VIEW(S) XRAY OF THE CHEST 06/04/2024 09:11:00 AM COMPARISON: 03/20/2024 CLINICAL HISTORY: Shortness of  breath. FINDINGS: LUNGS AND PLEURA: Airway thickening suggests bronchitis or reactive airways disease. Mild upper zone pulmonary vascular prominence, cannot exclude pulmonary venous hypertension. No focal pulmonary opacity. No pleural effusion. No pneumothorax. HEART AND MEDIASTINUM: Mild cardiomegaly. No acute abnormality of the mediastinal silhouette. BONES AND SOFT TISSUES: No acute osseous abnormality. IMPRESSION: 1. Airway thickening suggestive of bronchitis or reactive airways disease. 2. Mild upper zone pulmonary vascular prominence, cannot exclude pulmonary venous hypertension. 3. Mild cardiomegaly. Electronically signed by: Ryan Salvage MD 06/04/2024 01:17 PM EST RP Workstation: [id number]   US  Venous Img Lower  Left (DVT Study) Result Date: 06/03/2024 CLINICAL DATA:  left leg edema EXAM: LEFT LOWER EXTREMITY VENOUS DOPPLER ULTRASOUND TECHNIQUE: Gray-scale  sonography with graded compression, as well as color Doppler and duplex ultrasound were performed to evaluate the lower extremity deep venous systems from the level of the common femoral vein and including the common femoral, femoral, profunda femoral, popliteal and calf veins including the posterior tibial, peroneal and gastrocnemius veins when visible. The superficial great saphenous vein was also interrogated. Spectral Doppler was utilized to evaluate flow at rest and with distal augmentation maneuvers in the common femoral, femoral and popliteal veins. COMPARISON:  None Available. FINDINGS: Contralateral Common Femoral Vein: Respiratory phasicity is normal and symmetric with the symptomatic side. No evidence of thrombus. Normal compressibility. Common Femoral Vein: Partial compressibility of the common femoral vein. Vascular Doppler flow is present. Saphenofemoral Junction: No evidence of thrombus. Normal compressibility and flow on color Doppler imaging. Profunda Femoral Vein: No evidence of thrombus. Normal compressibility and flow on color Doppler imaging. Femoral Vein: No evidence of thrombus. Normal compressibility, respiratory phasicity and response to augmentation. Popliteal Vein: No evidence of thrombus. Normal compressibility, respiratory phasicity and response to augmentation. Calf Veins: No evidence of thrombus. Normal compressibility and flow on color Doppler imaging. Superficial Great Saphenous Vein: No evidence of thrombus. Normal compressibility. Other Findings:  None. IMPRESSION: Partial compressibility of the left common femoral vein, worrisome for a nonocclusive thrombus. Electronically Signed   By: Rogelia Myers M.D.   On: 06/03/2024 16:20        Scheduled Meds:  chlorthalidone   25 mg Oral Daily   ferrous sulfate   325 mg Oral Q breakfast   fluticasone  furoate-vilanterol  1 puff Inhalation Daily   isosorbide -hydrALAZINE   2 tablet Oral TID   levothyroxine   112 mcg Oral Q0600    rosuvastatin   10 mg Oral Daily   sodium chloride  flush  3 mL Intravenous Q12H   Continuous Infusions:  heparin  1,000 Units/hr (06/05/24 0606)     LOS: 2 days    Time spent: 40 minutes    Toribio Hummer, MD [facility]   To contact the attending provider between 7A-7P or the covering provider during after hours 7P-7A, please log into the web site [email or web address] and access using universal [place] password for that web site. If you do not have the password, please call the hospital operator.  06/05/2024, 12:04 PM

## 2024-06-05 NOTE — Progress Notes (Addendum)
 PHARMACY - ANTICOAGULATION CONSULT NOTE  Pharmacy Consult for heparin   Indication: acute DVT  Allergies  Allergen Reactions   Shellfish Allergy Anaphylaxis and Swelling   Avocado Swelling    Lips   Codeine  Other (See Comments)    Constipation   Iodinated Contrast Media Other (See Comments)   Pineapple Swelling    Lip swelling    Penicillins Rash    Reaction: Childhood    Patient Measurements: heparin  dosing weight = 77 kg    Vital Signs: Temp: 99.4 F (37.4 C) (12/07 0406) BP: 181/112 (12/07 0406) Pulse Rate: 99 (12/07 0406)  Labs: Recent Labs    06/03/24 1423 06/04/24 0017 06/04/24 1638 06/05/24 0424  HGB 8.3* 7.8*  --  7.2*  HCT 22.5* 21.5*  --  19.7*  PLT 218 185  --  163  APTT  --   --  110* 116*  HEPARINUNFRC  --   --   --  >1.10*  CREATININE 1.80* 1.72*  --  1.41*    Estimated Creatinine Clearance: 46.4 mL/min (A) (by C-G formula based on SCr of 1.41 mg/dL (H)).   Medical History: Past Medical History:  Diagnosis Date   Asthma    Depression    Dyspnea    Hyperlipidemia    Hypertension    Hypothyroidism    Sciatic nerve disease    Thyroid  disease     Medications:  -Hx CVA on plavix  PTA  Assessment: Patient is a 63 y.o F who presented to the ED on 06/04/24 with c/o left lower leg and foot pain/swelling. Left LE doppler on 06/03/24 showed partial compressibility of the left common femoral vein, worrisome for a nonocclusive thrombus. She received Eliquis  10 mg x1 dose on 06/03/24 at 7:34 PM.  Anticoagulation changed to heparin  infusion in the acute period given some concern for AKI and hemoglobin trending down. Of note, patient is a [employer] and does not accept blood products.  Significant events: -12/6 aPTT 110 - elevated. RN reports blood oozing from IV site at 6:40 pm. IV access came out at 6:56 pm while trying to bandage the IV site. New IV site later established and heparin  resumed at lower rate around 8:30 pm per ok with [facility] NP  coverage. Previous IV site without bleeding, noted bruise to area.  Today, 06/05/2024: - aPTT = 116 sec; slightly elevated and increased despite infusion being off & rate reduction -Heparin  remains >1.1 - elevated due to recent apixaban  dose -CBC trending down -V/Q scan ordered 12/6 given some concern for shortness of breath -No further bleeding or infusion related concerns reported  Goal of Therapy:  Heparin  level 0.3-0.7 units/ml aPTT 66-102 seconds Monitor platelets by anticoagulation protocol: Yes   Plan:  -Continue heparin  infusion at 1000 units/hr -Check 8 hr aPTT  -Daily aPTT and heparin  level  -Monitor CBC closely and s/sx bleeding    Stefano MARLA Bologna, PharmD, BCPS Clinical Pharmacist 06/05/2024 10:13 AM

## 2024-06-05 NOTE — Progress Notes (Signed)
 PHARMACY - ANTICOAGULATION CONSULT NOTE  Pharmacy Consult for heparin   Indication: acute DVT  Allergies  Allergen Reactions   Shellfish Allergy Anaphylaxis and Swelling   Avocado Swelling    Lips   Codeine  Other (See Comments)    Constipation   Iodinated Contrast Media Other (See Comments)   Pineapple Swelling    Lip swelling    Penicillins Rash    Reaction: Childhood    Patient Measurements: heparin  dosing weight = 77 kg    Vital Signs: Temp: 99.4 F (37.4 C) (12/07 0406) BP: 181/112 (12/07 0406) Pulse Rate: 99 (12/07 0406)  Labs: Recent Labs    06/03/24 1423 06/04/24 0017 06/04/24 1638 06/05/24 0424  HGB 8.3* 7.8*  --  7.2*  HCT 22.5* 21.5*  --  19.7*  PLT 218 185  --  163  APTT  --   --  110*  --   HEPARINUNFRC  --   --   --  >1.10*  CREATININE 1.80* 1.72*  --  1.41*    Estimated Creatinine Clearance: 46.4 mL/min (A) (by C-G formula based on SCr of 1.41 mg/dL (H)).   Medical History: Past Medical History:  Diagnosis Date   Asthma    Depression    Dyspnea    Hyperlipidemia    Hypertension    Hypothyroidism    Sciatic nerve disease    Thyroid  disease     Medications:  - hx CVA on plavix  PTA  Assessment: Patient is a 63 y.o [employer]  F who presented to the ED on 06/04/24 with c/o left lower leg and foot pain/swelling.  Left LE doppler on 06/03/24 showed partial compressibility of the left common femoral vein, worrisome for a nonocclusive thrombus. She received Eliquis  10 mg x1 dose on 06/03/24 at 7:34 PM.  Anticoagulation changed to heparin  drip in case anticoagulant needs to be stopped due to bleeding or severe anemia and reversal is needed.  Today, 06/05/2024: - aPTT = 116 sec; slightly elevated and increased despite infusion being off & rate reduction - Heparin  remains >1.1; elevated most due apixaban  dose.  Continue to adjust heparin  using aPTT until coordinates with heparin  level. -CBC: Hg 7.2-low & trending down; pltc WNL but trending  down -No further bleeding or infusion related concerns reported by RN  Goal of Therapy:  Heparin  level 0.3-0.7 units/ml aPTT 66-102 seconds Monitor platelets by anticoagulation protocol: Yes   Plan:  - Decrease heparin  drip to 1000 units/hr - check 8 hr aPTT  - check daily aPTT and heparin  level  - monitor cbc closely and s/sx bleeding   Michelle LillistonPharmD 06/05/2024,5:02 AM

## 2024-06-06 ENCOUNTER — Inpatient Hospital Stay (HOSPITAL_COMMUNITY)

## 2024-06-06 DIAGNOSIS — I82412 Acute embolism and thrombosis of left femoral vein: Secondary | ICD-10-CM | POA: Diagnosis not present

## 2024-06-06 DIAGNOSIS — I10 Essential (primary) hypertension: Secondary | ICD-10-CM | POA: Diagnosis not present

## 2024-06-06 DIAGNOSIS — J45909 Unspecified asthma, uncomplicated: Secondary | ICD-10-CM | POA: Diagnosis not present

## 2024-06-06 DIAGNOSIS — M351 Other overlap syndromes: Secondary | ICD-10-CM | POA: Diagnosis not present

## 2024-06-06 LAB — BASIC METABOLIC PANEL WITH GFR
Anion gap: 10 (ref 5–15)
BUN: 29 mg/dL — ABNORMAL HIGH (ref 8–23)
CO2: 21 mmol/L — ABNORMAL LOW (ref 22–32)
Calcium: 8.5 mg/dL — ABNORMAL LOW (ref 8.9–10.3)
Chloride: 99 mmol/L (ref 98–111)
Creatinine, Ser: 1.39 mg/dL — ABNORMAL HIGH (ref 0.44–1.00)
GFR, Estimated: 43 mL/min — ABNORMAL LOW (ref >60)
Glucose, Bld: 164 mg/dL — ABNORMAL HIGH (ref 70–99)
Potassium: 3.2 mmol/L — ABNORMAL LOW (ref 3.5–5.1)
Sodium: 130 mmol/L — ABNORMAL LOW (ref 135–145)

## 2024-06-06 LAB — CBC
HCT: 21.4 % — ABNORMAL LOW (ref 36.0–46.0)
Hemoglobin: 8 g/dL — ABNORMAL LOW (ref 12.0–15.0)
MCH: 27.1 pg (ref 26.0–34.0)
MCHC: 37.4 g/dL — ABNORMAL HIGH (ref 30.0–36.0)
MCV: 72.5 fL — ABNORMAL LOW (ref 80.0–100.0)
Platelets: 195 K/uL (ref 150–400)
RBC: 2.95 MIL/uL — ABNORMAL LOW (ref 3.87–5.11)
RDW: 16.5 % — ABNORMAL HIGH (ref 11.5–15.5)
WBC: 7.9 K/uL (ref 4.0–10.5)
nRBC: 0 % (ref 0.0–0.2)

## 2024-06-06 MED ORDER — AMLODIPINE BESYLATE 5 MG PO TABS
5.0000 mg | ORAL_TABLET | Freq: Every day | ORAL | Status: DC
  Administered 2024-06-06: 5 mg via ORAL
  Filled 2024-06-06: qty 1

## 2024-06-06 MED ORDER — POTASSIUM CHLORIDE CRYS ER 20 MEQ PO TBCR
40.0000 meq | EXTENDED_RELEASE_TABLET | Freq: Once | ORAL | Status: AC
  Administered 2024-06-06: 40 meq via ORAL
  Filled 2024-06-06: qty 2

## 2024-06-06 MED ORDER — ENOXAPARIN SODIUM 100 MG/ML IJ SOSY
1.0000 mg/kg | PREFILLED_SYRINGE | Freq: Two times a day (BID) | INTRAMUSCULAR | Status: DC
  Administered 2024-06-06 – 2024-06-07 (×3): 90 mg via SUBCUTANEOUS
  Filled 2024-06-06 (×3): qty 1

## 2024-06-06 MED ORDER — TECHNETIUM TO 99M ALBUMIN AGGREGATED
4.4000 | Freq: Once | INTRAVENOUS | Status: AC
  Administered 2024-06-06: 4.4 via INTRAVENOUS

## 2024-06-06 NOTE — Plan of Care (Signed)

## 2024-06-06 NOTE — Progress Notes (Signed)
 Pt refused AM lab draws due to size of the tubes being used. Pt was educated that it was not much blood being taken to test the heparin  levels, but pt was adamant about refusing, as it made her uncomfortable. Pt stated that she wants to discuss the frequency and amount of blood taken with the AM doctor as soon as possible. On call is aware.

## 2024-06-06 NOTE — Progress Notes (Signed)
 PROGRESS NOTE    Evelyn Baker  FMW:[id number] DOB: [birth year] DOA: 06/03/2024 PCP: Katina Pfeiffer, PA-C    Chief Complaint  Patient presents with   Leg Swelling    Brief Narrative:  Evelyn Baker is a 63 y.o. 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    Assessment & Plan:   Principal Problem:   Acute deep vein thrombosis (DVT) of left femoral vein (HCC) Active Problems:   AKI (acute kidney injury)   Microcytic anemia   Mixed connective tissue disease   Hypokalemia   Hypertension   Hypothyroidism   Hyperlipidemia   Asthma   History of CVA (cerebrovascular accident)  #1 acute left, femoral vein DVT -Patient denies any prior history of DVT, no recent surgeries.  Patient stated had a 4-hour car ride recently. - Patient noted the morning of 06/04/2024 with complaints of shortness of breath per RN.   - Patient denies any further significant shortness of breath.  - VQ scan pending. - Patient on IV heparin  however heparin  levels could not be obtained this morning as pediatric tubes were not available.  As patient states Jehovah's Witness was concerned about her hemoglobin levels.  -Discontinue IV heparin  and placed on full dose Lovenox  and could hopefully likely transition in the next 24 to 48 hours to oral DOAC if renal function remains stable and continues to improve.   - Keep left lower extremity elevated.   - Continue scheduled Tylenol  for pain control.    2.  AKI -Patient noted to have a creatinine of 1.80 on admission compared to previous baseline of approximately 1.0-1.1. - Per chart review patient with a creatinine 1.07 on 04/15/2024. - Etiology of AKI likely multifactorial secondary to prerenal azotemia due to decreased oral intake in the setting of ARB. - Urinalysis with 100 protein, small leukocytes, nitrite negative, rare  bacteria, 11-20 WBCs.   - Urine sodium at 59, urine creatinine at 83.  -Renal function improving creatinine down to 1.39 today from 1.80 on admission.  -Continue to hold ARB. - Saline lock IV fluids.  - Follow.  3.  Microcytic anemia  - Hemoglobin noted to 8.3 on admission with prior hemoglobin of 10.0 03/20/2024 and prior to that 8.5 on 03/15/2024. - Patient with no overt bleeding. - FOBT negative. - Anemia panel with iron  level of 23, TIBC of 165, ferritin of 473, folate of 7.5. - Hemoglobin currently at 8.0 today, partly dilutional. - Patient is a Jehovah's Witness and as such does not accept blood products. - Patient agreeable to IV iron  if needed.  4.  Hypokalemia  - KDur 40 mEq p.o. x 1.    5.  Hypertension - BP noted to be elevated this morning.   - ARB on hold secondary to AKI.   - Continue BiDil  2 tabs 3 times daily.   - Start Norvasc  5 mg daily and uptitrate as needed for better blood pressure control.  - Continue chlorthalidone .   6.  History of CVA -Plavix  on hold as patient currently on full dose anticoagulation. - Continue rosuvastatin .   7.  Hypothyroidism - Continue Synthroid .  8.  Hyperlipidemia - Continue rosuvastatin .  9.  Mixed connective tissue disease/osteoarthritis -Patient being followed by rheumatology, Dr. Barnie Gentry and managed on Imuran  in the outpatient setting. - Patient states Imuran  held recently while on antibiotics. - Outpatient follow-up with rheumatology.  10.  Asthma -Stable. - Continue Symbicort , albuterol  as needed.    DVT prophylaxis: Heparin  Code Status: Full Family Communication: Updated patient, and patient advocate who was on the telephone.   Disposition: TBD  Status is: Inpatient Remains inpatient appropriate because: Severity of illness   Consultants:  None  Procedures: Lower extremity Dopplers 06/03/2024 Chest x-ray 06/04/2024 Renal ultrasound 06/04/2024 VQ scan pending.  Antimicrobials:  Anti-infectives (From  admission, onward)    None         Subjective: Patient sitting up in bed.  Patient frustrated blood work could not be obtained this morning with pediatric tubes for heparin  levels.  Patient denies any chest pain or shortness of breath.  No abdominal pain.  Feels left lower extremity pain has improved on the scheduled Tylenol .  Denies any bleeding.   Objective: Vitals:   06/05/24 1250 06/05/24 1256 06/05/24 1934 06/06/24 0512  BP: (!) 176/103 (!) 196/99 (!) 182/92 (!) 177/95  Pulse: (!) 106  (!) 106 (!) 103  Resp: 18   18  Temp: 99.8 F (37.7 C)  98.7 F (37.1 C) 99.3 F (37.4 C)  TempSrc:   Oral   SpO2: 98%  98% 99%  Weight:      Height:        Intake/Output Summary (Last 24 hours) at 06/06/2024 1210 Last data filed at 06/06/2024 0636 Gross per 24 hour  Intake 1763.25 ml  Output --  Net 1763.25 ml    Filed Weights   06/05/24 1028  Weight: 89.8 kg    Examination:  General exam: NAD Respiratory system: CTAB.  No wheezes, no crackles, no rhonchi.  Fair air movement.  Speaking in full sentences.  No use of accessory muscles of respiration. Cardiovascular system: RRR no murmurs rubs or gallops.  No JVD.  Trace to 1+ left lower extremity edema.  Gastrointestinal system: Abdomen is soft, nontender, nondistended, positive bowel sounds.  No rebound.  No guarding.  Central nervous system: Alert and oriented. No focal neurological deficits. Extremities: Trace to 1+ left lower extremity edema. Skin: No rashes, lesions or ulcers Psychiatry: Judgement and insight appear normal. Mood & affect appropriate.     Data Reviewed: I have personally reviewed following labs and imaging studies  CBC: Recent Labs  Lab 06/03/24 1423 06/04/24 0017 06/05/24 0424 06/06/24 0857  WBC 7.2 5.1 7.4 7.9  NEUTROABS 6.0  --  5.8  --   HGB 8.3* 7.8* 7.2* 8.0*  HCT 22.5* 21.5* 19.7* 21.4*  MCV 72.3* 75.2* 72.7* 72.5*  PLT 218 185 163 195    Basic Metabolic Panel: Recent Labs  Lab  06/03/24 1423 06/04/24 0017 06/05/24 0424 06/06/24 0857  NA 135 133* 133* 130*  K 3.3* 4.3 3.6 3.2*  CL 99 102 103 99  CO2 24 21* 21* 21*  GLUCOSE 131* 129* 100* 164*  BUN 46* 47* 37* 29*  CREATININE 1.80* 1.72* 1.41* 1.39*  CALCIUM  9.1 8.4* 8.3* 8.5*    GFR: Estimated Creatinine Clearance: 47 mL/min (A) (by C-G formula based on SCr of 1.39 mg/dL (H)).  Liver Function Tests: No results for input(s): AST, ALT, ALKPHOS, BILITOT, PROT, ALBUMIN  in the last 168 hours.  CBG: No results for input(s): GLUCAP in the last 168 hours.   Recent Results (from the past 240 hours)  Urine Culture (for pregnant, neutropenic or urologic patients or patients with an indwelling urinary catheter)     Status: None   Collection Time: 06/04/24  1:45 PM   Specimen: Urine, Clean Catch  Result Value Ref Range Status   Specimen Description   Final    URINE, CLEAN CATCH Performed at [facility], [address] [place], [place] [address]    Special Requests   Final    NONE Performed at [facility], [address] [place], [place] [address]    Culture   Final    NO GROWTH Performed at [facility], [address], [place], [place] [address]    Report Status 06/05/2024 FINAL  Final         Radiology Studies: DG CHEST PORT 1 VIEW Result Date: 06/05/2024 EXAM: 1 VIEW(S) XRAY OF THE CHEST 06/05/2024 08:56:00 PM COMPARISON: 06/04/2024 CLINICAL HISTORY: Respiratory abnormality FINDINGS: LUNGS AND PLEURA: Mild pulmonary edema. Mild bilateral interstitial prominence. Mild bilateral peribronchial cuffing. No pleural effusion. No pneumothorax. HEART AND MEDIASTINUM: Borderline cardiomegaly. No acute abnormality of the mediastinal silhouette. BONES AND SOFT TISSUES: No acute osseous abnormality. IMPRESSION: 1. Mild pulmonary edema. Electronically signed by: Morgane Naveau MD 06/05/2024 11:15 PM EST RP Workstation: [id number]   US  RENAL Result  Date: 06/04/2024 EXAM: US  Retroperitoneum Complete, Renal. 06/04/2024 05:56:29 PM TECHNIQUE: Real-time ultrasonography of the retroperitoneum renal was performed. COMPARISON: None available CLINICAL HISTORY: Acute renal failure. FINDINGS: FINDINGS: RIGHT KIDNEY/URETER: Right kidney measures 12.5 x 4.9 x 5.7 cm. Mildly increased echotexture. No hydronephrosis. No calculus. No mass. LEFT KIDNEY/URETER: Left kidney measures 11.5 x 5.4 x 4.5 cm. Mildly increased echotexture. No hydronephrosis. No calculus. No mass. BLADDER: Unremarkable appearance of the bladder. IMPRESSION: 1. Mildly increased renal cortical echotexture bilaterally, which can be seen with medical renal disease or acute kidney injury. Electronically signed by: Franky Crease MD 06/04/2024 07:41 PM EST RP Workstation: [id number]        Scheduled Meds:  acetaminophen   1,000 mg Oral TID   amLODipine   5 mg Oral Daily   chlorthalidone   25 mg Oral Daily   enoxaparin  (LOVENOX ) injection  1 mg/kg Subcutaneous BID   ferrous sulfate   325 mg Oral Q breakfast   fluticasone  furoate-vilanterol  1 puff Inhalation Daily   isosorbide -hydrALAZINE   2 tablet Oral TID   levothyroxine   112 mcg Oral Q0600   potassium chloride   40 mEq Oral Once   rosuvastatin   10 mg Oral Daily   sodium chloride  flush  3 mL Intravenous Q12H   Continuous Infusions:  sodium chloride  Stopped (06/06/24 1147)     LOS: 3 days    Time spent: 40 minutes    Toribio Hummer, MD [facility]   To contact the attending provider between 7A-7P or the covering provider during after hours 7P-7A, please log into the web site [email or web address] and access using universal [place] password for that web site. If you do not have the password, please call the hospital operator.  06/06/2024, 12:10 PM

## 2024-06-06 NOTE — Progress Notes (Signed)
   06/06/24 1535  TOC Brief Assessment  Insurance and Status Reviewed  Patient has primary care physician Yes  Home environment has been reviewed resides in private residence  Prior level of function: Independent  Prior/Current Home Services No current home services  Social Drivers of Health Review SDOH reviewed no interventions necessary  Readmission risk has been reviewed Yes  Transition of care needs no transition of care needs at this time

## 2024-06-06 NOTE — Progress Notes (Addendum)
 PHARMACY - ANTICOAGULATION CONSULT NOTE  Pharmacy Consult for heparin   Indication: acute DVT  Allergies  Allergen Reactions   Shellfish Allergy Anaphylaxis and Swelling   Avocado Swelling    Lips   Codeine  Other (See Comments)    Constipation   Iodinated Contrast Media Other (See Comments)   Pineapple Swelling    Lip swelling    Penicillins Rash    Reaction: Childhood    Patient Measurements: heparin  dosing weight = 77 kg Height: 5' 5 (165.1 cm) Weight: 89.8 kg (198 lb) IBW/kg (Calculated) : 57 HEPARIN  DW (KG): 76.8  Vital Signs: Temp: 99.3 F (37.4 C) (12/08 0512) BP: 177/95 (12/08 0512) Pulse Rate: 103 (12/08 0512)  Labs: Recent Labs    06/04/24 0017 06/04/24 1638 06/05/24 0424 06/05/24 1438 06/05/24 2130 06/06/24 0857  HGB 7.8*  --  7.2*  --   --  8.0*  HCT 21.5*  --  19.7*  --   --  21.4*  PLT 185  --  163  --   --  195  APTT  --    < > 116* 75* 63*  --   HEPARINUNFRC  --   --  >1.10*  --   --   --   CREATININE 1.72*  --  1.41*  --   --  1.39*   < > = values in this interval not displayed.    Estimated Creatinine Clearance: 47 mL/min (A) (by C-G formula based on SCr of 1.39 mg/dL (H)).   Medical History: Past Medical History:  Diagnosis Date   Asthma    Depression    Dyspnea    Hyperlipidemia    Hypertension    Hypothyroidism    Sciatic nerve disease    Thyroid  disease     Medications:  -Hx CVA on plavix  PTA  Assessment: Patient is a 63 y.o F who presented to the ED on 06/04/24 with c/o left lower leg and foot pain/swelling. Left LE doppler on 06/03/24 showed partial compressibility of the left common femoral vein, worrisome for a nonocclusive thrombus. She received Eliquis  10 mg x1 dose on 06/03/24 at 7:34 PM.  Anticoagulation changed to heparin  infusion in the acute period given some concern for AKI and hemoglobin trending down. Of note, patient is a [employer] and does not accept blood products.  Significant events: -12/6 aPTT 110  - elevated. RN reports blood oozing from IV site at 6:40 pm. IV access came out at 6:56 pm while trying to bandage the IV site. New IV site later established and heparin  resumed at lower rate around 8:30 pm per ok with TRH NP coverage. Previous IV site without bleeding, noted bruise to area.  - V/Q scan ordered 12/6 given some concern for shortness of breath -- not done yet   Today, 06/06/2024:  - Per RN report, patient refusing heparin /aptt draws due to size of standard tubes used. Requested pediatric tubes be utilized. Team, including phlebotomy, had multiple discussions with patient and husband this morning, but remained adamant about the request.  - Discussed with hospitalist, and decision was made to transition to therapeutic lovenox  this morning to ensure adequate anticoagulation and prevent future delays.  - AKI resolving - Hgb improving to 8, PLTc WNL   Goal of Therapy:  Monitor platelets by anticoagulation protocol: Yes   Plan:  -Discontinue IV heparin  and start therapeutic lovenox  at 90mg  [place] q12h (~1mg /kg) -Monitor renal function, CBC closely and s/sx bleeding  -Follow up V/Q scan  -Follow up ability  to transition to oral anticoagulation    Thank you for allowing pharmacy to be a part of this patient's care.  Marget Hench, PharmD Clinical Pharmacist 06/06/2024 11:15 AM

## 2024-06-07 ENCOUNTER — Other Ambulatory Visit (HOSPITAL_COMMUNITY): Payer: Self-pay

## 2024-06-07 LAB — CBC
HCT: 20 % — ABNORMAL LOW (ref 36.0–46.0)
Hemoglobin: 7.2 g/dL — ABNORMAL LOW (ref 12.0–15.0)
MCH: 26.3 pg (ref 26.0–34.0)
MCHC: 36 g/dL (ref 30.0–36.0)
MCV: 73 fL — ABNORMAL LOW (ref 80.0–100.0)
Platelets: 223 K/uL (ref 150–400)
RBC: 2.74 MIL/uL — ABNORMAL LOW (ref 3.87–5.11)
RDW: 16.7 % — ABNORMAL HIGH (ref 11.5–15.5)
WBC: 6.7 K/uL (ref 4.0–10.5)
nRBC: 0 % (ref 0.0–0.2)

## 2024-06-07 LAB — BASIC METABOLIC PANEL WITH GFR
Anion gap: 10 (ref 5–15)
BUN: 31 mg/dL — ABNORMAL HIGH (ref 8–23)
CO2: 21 mmol/L — ABNORMAL LOW (ref 22–32)
Calcium: 8.7 mg/dL — ABNORMAL LOW (ref 8.9–10.3)
Chloride: 102 mmol/L (ref 98–111)
Creatinine, Ser: 1.29 mg/dL — ABNORMAL HIGH (ref 0.44–1.00)
GFR, Estimated: 47 mL/min — ABNORMAL LOW (ref >60)
Glucose, Bld: 88 mg/dL (ref 70–99)
Potassium: 3.6 mmol/L (ref 3.5–5.1)
Sodium: 132 mmol/L — ABNORMAL LOW (ref 135–145)

## 2024-06-07 MED ORDER — APIXABAN 5 MG PO TABS: 5.0000 mg | ORAL_TABLET | Freq: Two times a day (BID) | ORAL | Status: DC

## 2024-06-07 MED ORDER — AMLODIPINE BESYLATE 10 MG PO TABS
10.0000 mg | ORAL_TABLET | Freq: Every day | ORAL | Status: DC
  Administered 2024-06-08: 10 mg via ORAL
  Filled 2024-06-07 (×2): qty 1

## 2024-06-07 MED ORDER — AMLODIPINE BESYLATE 5 MG PO TABS
5.0000 mg | ORAL_TABLET | Freq: Every day | ORAL | Status: DC
  Administered 2024-06-07: 5 mg via ORAL
  Filled 2024-06-07: qty 1

## 2024-06-07 MED ORDER — APIXABAN 5 MG PO TABS
10.0000 mg | ORAL_TABLET | Freq: Two times a day (BID) | ORAL | Status: DC
  Administered 2024-06-07: 10 mg via ORAL

## 2024-06-07 MED ORDER — BENZONATATE 100 MG PO CAPS
100.0000 mg | ORAL_CAPSULE | Freq: Three times a day (TID) | ORAL | Status: DC
  Administered 2024-06-07 – 2024-06-13 (×14): 100 mg via ORAL
  Filled 2024-06-07 (×16): qty 1

## 2024-06-07 MED ORDER — AZATHIOPRINE 50 MG PO TABS
100.0000 mg | ORAL_TABLET | Freq: Every day | ORAL | Status: DC
  Administered 2024-06-07 – 2024-06-13 (×7): 100 mg via ORAL
  Filled 2024-06-07 (×7): qty 2

## 2024-06-07 MED ORDER — AMLODIPINE BESYLATE 5 MG PO TABS
5.0000 mg | ORAL_TABLET | Freq: Once | ORAL | Status: AC
  Administered 2024-06-07: 5 mg via ORAL
  Filled 2024-06-07: qty 1

## 2024-06-07 MED ORDER — APIXABAN 5 MG PO TABS
10.0000 mg | ORAL_TABLET | Freq: Two times a day (BID) | ORAL | Status: DC
  Administered 2024-06-07 – 2024-06-09 (×5): 10 mg via ORAL
  Filled 2024-06-07 (×6): qty 2

## 2024-06-07 NOTE — Plan of Care (Signed)

## 2024-06-07 NOTE — Progress Notes (Signed)
 PROGRESS NOTE    Evelyn Baker  FMW:[id number] DOB: [birth year] DOA: 06/03/2024 PCP: Katina Pfeiffer, PA-C    Chief Complaint  Patient presents with   Leg Swelling    Brief Narrative:  Evelyn Baker is a 63 y.o. 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    Assessment & Plan:   Principal Problem:   Acute deep vein thrombosis (DVT) of left femoral vein (HCC) Active Problems:   AKI (acute kidney injury)   Microcytic anemia   Mixed connective tissue disease   Hypokalemia   Hypertension   Hypothyroidism   Hyperlipidemia   Asthma   History of CVA (cerebrovascular accident)  #1 acute left, femoral vein DVT -Patient denies any prior history of DVT, no recent surgeries.  Patient stated had a 4-hour car ride recently. - Patient noted the morning of 06/04/2024 with complaints of shortness of breath per RN.   - Patient denies any further significant shortness of breath.  - VQ scan pending. - Patient on IV heparin  however heparin  levels could not be obtained this morning as pediatric tubes were not available.  As patient states Jehovah's Witness was concerned about her hemoglobin levels.  -Discontinued IV heparin  and placed on full dose Lovenox  and Baker transition to Eliquis  today. - Keep left lower extremity elevated.   - Continue scheduled Tylenol  for pain control.    2.  AKI -Patient noted to have a creatinine of 1.80 on admission compared to previous baseline of approximately 1.0-1.1. - Per chart review patient with a creatinine 1.07 on 04/15/2024. - Etiology of AKI likely multifactorial secondary to prerenal azotemia due to decreased oral intake in the setting of ARB. - Urinalysis with 100 protein, small leukocytes, nitrite negative, rare bacteria, 11-20 WBCs.   - Urine sodium at 59, urine creatinine at 83.  -Renal function improving  creatinine down to 1.29 from 1.39 from 1.80 on admission.  -Continue to hold ARB and likely Baker not resume on discharge. - Saline lock IV fluids.  - Follow.  3.  Microcytic anemia  - Hemoglobin noted to 8.3 on admission with prior hemoglobin of 10.0 03/20/2024 and prior to that 8.5 on 03/15/2024. - Patient with no overt bleeding. - FOBT negative. - Anemia panel with iron  level of 23, TIBC of 165, ferritin of 473, folate of 7.5. - Hemoglobin currently at 7.2 today, partly dilutional. - Patient is a Jehovah's Witness and as such does not accept blood products. - Patient agreeable to IV iron  if needed.  4.  Hypokalemia  - Repleted.   - Potassium at 3.6.  5.  Hypertension - BP noted to be elevated..   - ARB on hold secondary to AKI.   - Continue BiDil  2 tabs 3 times daily.   - Increase Norvasc  to 10 mg daily.   - Continue chlorthalidone .  - Outpatient follow-up with cardiology.  6.  History of CVA -Plavix  on hold as patient currently on full dose anticoagulation. - Continue rosuvastatin .   7.  Hypothyroidism - Synthroid .   8.  Hyperlipidemia - Rosuvastatin .    9.  Mixed connective tissue disease/osteoarthritis -Patient being followed by rheumatology, Dr. Barnie Gentry and managed on Imuran  in the outpatient setting. - Patient states Imuran  held recently while on antibiotics. -Resume Imuran . - Outpatient follow-up with rheumatology.  10.  Asthma -Stable. - Symbicort , albuterol  as needed.    DVT  prophylaxis: Heparin  Code Status: Full Family Communication: Updated patient, and husband at bedside.  Disposition: Likely home hopefully in the next 24 hours if continued clinical improvement.  Status is: Inpatient Remains inpatient appropriate because: Severity of illness   Consultants:  None  Procedures: Lower extremity Dopplers 06/03/2024 Chest x-ray 06/04/2024 Renal ultrasound 06/04/2024 VQ scan 06/06/2024  Antimicrobials:  Anti-infectives (From admission, onward)     None         Subjective: Patient laying in bed.  Overall feeling a little bit better.  Denies any chest pain or shortness of breath.  Patient with complaints of nonproductive cough.  No abdominal pain.  No bleeding.  Feels left lower extremity pain has improved as well as swelling.  Husband at bedside.    Objective: Vitals:   06/07/24 0542 06/07/24 0715 06/07/24 0912 06/07/24 1142  BP: (!) 180/102 (!) 160/102 (!) 160/102 (!) 164/92  Pulse: (!) 103 (!) 108  99  Resp: 19   18  Temp: 98.6 F (37 C)   98.6 F (37 C)  TempSrc: Oral     SpO2: 97%   100%  Weight:      Height:        Intake/Output Summary (Last 24 hours) at 06/07/2024 1228 Last data filed at 06/07/2024 0911 Gross per 24 hour  Intake 240 ml  Output --  Net 240 ml    Filed Weights   06/05/24 1028 06/07/24 0500  Weight: 89.8 kg 101.2 kg    Examination:  General exam: NAD Respiratory system: Lungs clear to auscultation bilaterally.  No wheezes, no crackles, no rhonchi.  Fair air movement.  Speaking in full sentences.  No use of accessory muscles of respiration.   Cardiovascular system: Regular rate rhythm no murmurs rubs or gallops.  No JVD.  No lower extremity edema. Gastrointestinal system: Abdomen is soft, nontender, nondistended, positive bowel sounds.  No rebound.  No guarding.  Central nervous system: Alert and oriented. No focal neurological deficits. Extremities: Trace left lower extremity edema. Skin: No rashes, lesions or ulcers Psychiatry: Judgement and insight appear normal. Mood & affect appropriate.     Data Reviewed: I have personally reviewed following labs and imaging studies  CBC: Recent Labs  Lab 06/03/24 1423 06/04/24 0017 06/05/24 0424 06/06/24 0857 06/07/24 0536  WBC 7.2 5.1 7.4 7.9 6.7  NEUTROABS 6.0  --  5.8  --   --   HGB 8.3* 7.8* 7.2* 8.0* 7.2*  HCT 22.5* 21.5* 19.7* 21.4* 20.0*  MCV 72.3* 75.2* 72.7* 72.5* 73.0*  PLT 218 185 163 195 223    Basic Metabolic  Panel: Recent Labs  Lab 06/03/24 1423 06/04/24 0017 06/05/24 0424 06/06/24 0857 06/07/24 0536  NA 135 133* 133* 130* 132*  K 3.3* 4.3 3.6 3.2* 3.6  CL 99 102 103 99 102  CO2 24 21* 21* 21* 21*  GLUCOSE 131* 129* 100* 164* 88  BUN 46* 47* 37* 29* 31*  CREATININE 1.80* 1.72* 1.41* 1.39* 1.29*  CALCIUM  9.1 8.4* 8.3* 8.5* 8.7*    GFR: Estimated Creatinine Clearance: 54 mL/min (A) (by C-G formula based on SCr of 1.29 mg/dL (H)).  Liver Function Tests: No results for input(s): AST, ALT, ALKPHOS, BILITOT, PROT, ALBUMIN  in the last 168 hours.  CBG: No results for input(s): GLUCAP in the last 168 hours.   Recent Results (from the past 240 hours)  Urine Culture (for pregnant, neutropenic or urologic patients or patients with an indwelling urinary catheter)     Status: None  Collection Time: 06/04/24  1:45 PM   Specimen: Urine, Clean Catch  Result Value Ref Range Status   Specimen Description   Final    URINE, CLEAN CATCH Performed at [facility], [address], [place], [place] [address]    Special Requests   Final    NONE Performed at [facility], [address], [place], [place] [address]    Culture   Final    NO GROWTH Performed at [facility], [address], [place], [place] [address]    Report Status 06/05/2024 FINAL  Final         Radiology Studies: NM Pulmonary Perfusion Result Date: 06/06/2024 CLINICAL DATA:  Shortness of breath on minimal exertion. Deep venous thrombosis. EXAM: NUCLEAR MEDICINE PERFUSION LUNG SCAN TECHNIQUE: Perfusion images were obtained in multiple projections after intravenous injection of radiopharmaceutical. No ventilation imaging obtained. RADIOPHARMACEUTICALS:  4.4 mCi Tc-2m MAA IV COMPARISON:  Chest radiographs 06/05/2024.  Chest CT 03/22/2024 FINDINGS: The pulmonary perfusion is within normal limits. There are no wedge-shaped perfusion defects to suggest acute pulmonary  embolism. IMPRESSION: No evidence of acute pulmonary embolism on perfusion scintigraphy by PISAPED criteria. Electronically Signed   By: Elsie Perone M.D.   On: 06/06/2024 15:36   DG CHEST PORT 1 VIEW Result Date: 06/05/2024 EXAM: 1 VIEW(S) XRAY OF THE CHEST 06/05/2024 08:56:00 PM COMPARISON: 06/04/2024 CLINICAL HISTORY: Respiratory abnormality FINDINGS: LUNGS AND PLEURA: Mild pulmonary edema. Mild bilateral interstitial prominence. Mild bilateral peribronchial cuffing. No pleural effusion. No pneumothorax. HEART AND MEDIASTINUM: Borderline cardiomegaly. No acute abnormality of the mediastinal silhouette. BONES AND SOFT TISSUES: No acute osseous abnormality. IMPRESSION: 1. Mild pulmonary edema. Electronically signed by: Morgane Naveau MD 06/05/2024 11:15 PM EST RP Workstation: [id number]        Scheduled Meds:  acetaminophen   1,000 mg Oral TID   [START ON 06/08/2024] amLODipine   10 mg Oral Daily   apixaban   10 mg Oral BID   Followed by   NOREEN ON 06/14/2024] apixaban   5 mg Oral BID   azaTHIOprine   100 mg Oral Daily   chlorthalidone   25 mg Oral Daily   ferrous sulfate   325 mg Oral Q breakfast   fluticasone  furoate-vilanterol  1 puff Inhalation Daily   isosorbide -hydrALAZINE   2 tablet Oral TID   levothyroxine   112 mcg Oral Q0600   rosuvastatin   10 mg Oral Daily   sodium chloride  flush  3 mL Intravenous Q12H   Continuous Infusions:     LOS: 4 days    Time spent: 40 minutes    Toribio Hummer, MD [facility]   To contact the attending provider between 7A-7P or the covering provider during after hours 7P-7A, please log into the web site [email or web address] and access using universal [place] password for that web site. If you do not have the password, please call the hospital operator.  06/07/2024, 12:28 PM

## 2024-06-08 DIAGNOSIS — E785 Hyperlipidemia, unspecified: Secondary | ICD-10-CM | POA: Diagnosis not present

## 2024-06-08 DIAGNOSIS — I10 Essential (primary) hypertension: Secondary | ICD-10-CM | POA: Diagnosis not present

## 2024-06-08 DIAGNOSIS — Z8673 Personal history of transient ischemic attack (TIA), and cerebral infarction without residual deficits: Secondary | ICD-10-CM | POA: Diagnosis not present

## 2024-06-08 DIAGNOSIS — E039 Hypothyroidism, unspecified: Secondary | ICD-10-CM | POA: Diagnosis not present

## 2024-06-08 DIAGNOSIS — M351 Other overlap syndromes: Secondary | ICD-10-CM | POA: Diagnosis not present

## 2024-06-08 LAB — BASIC METABOLIC PANEL WITH GFR
Anion gap: 10 (ref 5–15)
BUN: 27 mg/dL — ABNORMAL HIGH (ref 8–23)
CO2: 22 mmol/L (ref 22–32)
Calcium: 8.5 mg/dL — ABNORMAL LOW (ref 8.9–10.3)
Chloride: 103 mmol/L (ref 98–111)
Creatinine, Ser: 1.18 mg/dL — ABNORMAL HIGH (ref 0.44–1.00)
GFR, Estimated: 52 mL/min — ABNORMAL LOW (ref >60)
Glucose, Bld: 88 mg/dL (ref 70–99)
Potassium: 3.4 mmol/L — ABNORMAL LOW (ref 3.5–5.1)
Sodium: 134 mmol/L — ABNORMAL LOW (ref 135–145)

## 2024-06-08 LAB — CBC
HCT: 19.4 % — ABNORMAL LOW (ref 36.0–46.0)
Hemoglobin: 6.8 g/dL — CL (ref 12.0–15.0)
MCH: 26.4 pg (ref 26.0–34.0)
MCHC: 35.1 g/dL (ref 30.0–36.0)
MCV: 75.2 fL — ABNORMAL LOW (ref 80.0–100.0)
Platelets: 201 K/uL (ref 150–400)
RBC: 2.58 MIL/uL — ABNORMAL LOW (ref 3.87–5.11)
RDW: 16.8 % — ABNORMAL HIGH (ref 11.5–15.5)
WBC: 5.1 K/uL (ref 4.0–10.5)
nRBC: 0 % (ref 0.0–0.2)

## 2024-06-08 MED ORDER — EPOETIN ALFA-EPBX 10000 UNIT/ML IJ SOLN
5000.0000 [IU] | Freq: Once | INTRAMUSCULAR | Status: AC
  Administered 2024-06-08: 5000 [IU] via SUBCUTANEOUS
  Filled 2024-06-08: qty 1

## 2024-06-08 MED ORDER — CYANOCOBALAMIN 1000 MCG/ML IJ SOLN
1000.0000 ug | Freq: Once | INTRAMUSCULAR | Status: AC
  Administered 2024-06-08: 1000 ug via INTRAMUSCULAR
  Filled 2024-06-08: qty 1

## 2024-06-08 MED ORDER — CARVEDILOL 6.25 MG PO TABS
6.2500 mg | ORAL_TABLET | Freq: Two times a day (BID) | ORAL | Status: DC
  Administered 2024-06-08 – 2024-06-13 (×10): 6.25 mg via ORAL
  Filled 2024-06-08 (×10): qty 1

## 2024-06-08 MED ORDER — FOLIC ACID 1 MG PO TABS
1.0000 mg | ORAL_TABLET | Freq: Every day | ORAL | Status: DC
  Administered 2024-06-08: 1 mg via ORAL
  Filled 2024-06-08: qty 1

## 2024-06-08 MED ORDER — POTASSIUM CHLORIDE CRYS ER 20 MEQ PO TBCR
40.0000 meq | EXTENDED_RELEASE_TABLET | Freq: Two times a day (BID) | ORAL | Status: AC
  Administered 2024-06-08 (×2): 40 meq via ORAL
  Filled 2024-06-08 (×2): qty 2

## 2024-06-08 MED ORDER — PANTOPRAZOLE SODIUM 40 MG PO TBEC
40.0000 mg | DELAYED_RELEASE_TABLET | Freq: Every day | ORAL | Status: DC
  Administered 2024-06-08: 40 mg via ORAL
  Filled 2024-06-08: qty 1

## 2024-06-08 MED ORDER — LABETALOL HCL 5 MG/ML IV SOLN
10.0000 mg | INTRAVENOUS | Status: DC | PRN
  Administered 2024-06-13: 06:00:00 10 mg via INTRAVENOUS
  Filled 2024-06-08: qty 4

## 2024-06-08 MED ORDER — IRON SUCROSE 200 MG IVPB - SIMPLE MED
200.0000 mg | Freq: Once | Status: AC
  Administered 2024-06-08: 200 mg via INTRAVENOUS
  Filled 2024-06-08: qty 200

## 2024-06-08 NOTE — Progress Notes (Signed)
 PROGRESS NOTE    [name]  FMW:[id number] DOB: October 10, 1960 DOA: 06/03/2024 PCP: Katina Pfeiffer, PA-C   Brief Narrative:  Evelyn Baker is a 63 y.o. 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 Hgb Trended down but no active Sx of bleeding. Will give her IV Iron  and EPO.  Assessment and Plan:  Acute Left, Femoral Vein DVT: Patient denies any prior history of DVT, no recent surgeries.  Patient stated had a 4-hour car ride recently. -Patient noted the morning of 06/04/2024 with complaints of shortness of breath per RN.   -Patient denies any further significant shortness of breath.  -VQ scan done and showed that the pulmonary perfusion is within normal limits. There are no wedge-shaped perfusion defects to suggest acute pulmonary embolism.. -Patient was on IV heparin  however heparin  levels could not be obtained this morning as pediatric tubes were not available.  As patient states Jehovah's Witness was concerned about her hemoglobin levels. Discontinued IV heparin  and placed on full dose Lovenox  and subsequently transitioned to Apixaban  on 06/07/24. -Keep left lower extremity elevated.   -Continue scheduled Acetaminophen  1000 mg po TID for pain control.     AKI on CKD Stage 3a / Metabolic Acidosis: Improved. Patient noted to have a creatinine of 1.80 on admission compared to previous baseline of approximately 1.0-1.1. Per chart review patient with a creatinine 1.07 on 04/15/2024. Etiology of AKI likely multifactorial secondary to prerenal azotemia due to decreased oral intake in the setting of ARB. Urinalysis with 100 protein, small leukocytes, nitrite negative, rare bacteria, 11-20 WBCs. Urine sodium at 59, urine creatinine at 83. BUN/Cr Trend: Recent Labs  Lab 06/03/24 1423 06/04/24 0017 06/05/24 0424 06/06/24 0857 06/07/24 0536 06/08/24 0533  BUN  46* 47* 37* 29* 31* 27*  CREATININE 1.80* 1.72* 1.41* 1.39* 1.29* 1.18*  -Continue to hold ARB and likely will not resume on discharge. Saline lock IV fluids.  -Avoid Nephrotoxic Medications, Contrast Dyes, Hypotension and Dehydration to Ensure Adequate Renal Perfusion and will need to Renally Adjust Meds. CTM and Trend Renal Function carefully and repeat CMP in the AM    Acute on Chronic Microcytic Anemia:  Hemoglobin noted to 8.3 on admission with prior hemoglobin of 10.0 03/20/2024 and prior to that 8.5 on 03/15/2024. Hgb/Hct continues to Trend down but Patient with no overt bleeding as current trend shows:  Recent Labs  Lab 06/03/24 1423 06/04/24 0017 06/05/24 0424 06/06/24 0857 06/07/24 0536 06/08/24 0533  HGB 8.3* 7.8* 7.2* 8.0* 7.2* 6.8*  HCT 22.5* 21.5* 19.7* 21.4* 20.0* 19.4*  MCV 72.3* 75.2* 72.7* 72.5* 73.0* 75.2*  -FOBT negative. Anemia panel with iron  level of 23, TIBC of 165, ferritin of 473, folate of 7.5. Hemoglobin currently at 6.8 today, partly dilutional. Patient is a Jehov[employer]'s Witness and as such does not accept blood products. Patient agreeable to IV iron  so will give her a dose of Iron  Sucrose 200 mg x1 today and Epoetin  Alfa-epbx 5,000 units.    Hypokalemia: K+ is now 3.4. Replete w/ po KCL 40 mEQ BID x2. CTM and Replete as Necessary.    Essential Hypertension: ARB on hold secondary to AKI. Continue BiDil  20-37.5 mg per tab 2 tabs 3 times daily. C/w Amlodipine  10 mg po Daily and Chlorthalidone  25 mg po Daily. CTM BP per Protocol. Last BP reading was elevated to 180/100. Will also add Carvedilol  6.25 mg po  BID. Add IV Labetalol 10 mg q2hprn for SBP >180 or DBP >100. Outpatient follow-up with Cardiology.   History of CVA: Plavix  on hold as patient currently on full dose anticoagulation. Continue Rosuvastatin  10 mg po Daily.    Hypothyroidism: C/w Levothyroxine  112 mcg po Daily     Hyperlipidemia: C/w Rosuvastatin  10 mg po Daily    Mixed connective tissue  disease/osteoarthritis -Patient being followed by rheumatology, Dr. Dolphus and managed on Azathioprine  100g mg po daily in the outpatient setting. Patient states Imuran  held recently while on antibiotics but Resumed Imuran . Will need Outpatient follow-up with Rheumatology.   Asthma: Stable C/w Symbicort  substitution with Fluticasone  Furoate-Vilanterol 1 puff IH Daily and Albuterol  as needed.   Class II Obesity: Complicates overall prognosis and care. Estimated body mass index is 36.71 kg/m as calculated from the following:   Height as of this encounter: 5' 5 (1.651 m).   Weight as of this encounter: 100.1 kg. Weight Loss and Dietary Counseling given   DVT prophylaxis:  apixaban  (ELIQUIS ) tablet 10 mg  apixaban  (ELIQUIS ) tablet 5 mg    Code Status: Full Code Family Communication: D/w Husband @ bedside  Disposition Plan:  Level of care: Telemetry Status is: Inpatient Remains inpatient appropriate because: Needs further clinical workup and stabilization of Hgb   Consultants:  None  Procedures:  As delineated as above  Antimicrobials:  Anti-infectives (From admission, onward)    None       Subjective: Seen and examined at bedside and was still complaining of some leg pain.  No nausea or vomiting.  Feels okay otherwise.  Concerned about her hemoglobin but denies any active bleeding and has not noticed any blood in her stools or her urine.  No other concerns or complaints at this time.  Objective: Vitals:   06/08/24 0632 06/08/24 0926 06/08/24 1251 06/08/24 1635  BP: (!) 185/94 (!) 182/93 (!) 163/76 (!) 180/100  Pulse: 100  (!) 113 (!) 118  Resp: 18  18 18   Temp: 98.1 F (36.7 C)  99.5 F (37.5 C) 98.4 F (36.9 C)  TempSrc: Oral     SpO2: 99%  98% 97%  Weight:      Height:        Intake/Output Summary (Last 24 hours) at 06/08/2024 1703 Last data filed at 06/08/2024 1607 Gross per 24 hour  Intake 350 ml  Output --  Net 350 ml   Filed Weights   06/05/24 1028  06/07/24 0500 06/08/24 0500  Weight: 89.8 kg 101.2 kg 100.1 kg   Examination: Physical Exam:  Constitutional: WN/WD obese African-American female in no acute distress Respiratory: Diminished to auscultation bilaterally, no wheezing, rales, rhonchi or crackles. Normal respiratory effort and patient is not tachypenic. No accessory muscle use.  Unlabored breathing Cardiovascular: RRR, no murmurs / rubs / gallops. S1 and S2 auscultated.  Is 1+ extremity edema worse on the left compared to right Abdomen: Soft, non-tender, distended secondary to body habitus bowel sounds positive.  GU: Deferred. Musculoskeletal: No clubbing / cyanosis of digits/nails. No joint deformity upper and lower extremities but left leg is swollen and tender to palpate Skin: No rashes, lesions, ulcers on a limited skin evaluation. No induration; Warm and dry.  Neurologic: CN 2-12 grossly intact with no focal deficits. Romberg sign and cerebellar reflexes not assessed.  Psychiatric: Normal judgment and insight. Alert and oriented x 3. Normal mood and appropriate affect.   Data Reviewed: I have personally reviewed following labs and imaging studies  CBC: Recent Labs  Lab 06/03/24 1423 06/04/24 0017 06/05/24 0424 06/06/24 0857 06/07/24 0536 06/08/24 0533  WBC 7.2 5.1 7.4 7.9 6.7 5.1  NEUTROABS 6.0  --  5.8  --   --   --   HGB 8.3* 7.8* 7.2* 8.0* 7.2* 6.8*  HCT 22.5* 21.5* 19.7* 21.4* 20.0* 19.4*  MCV 72.3* 75.2* 72.7* 72.5* 73.0* 75.2*  PLT 218 185 163 195 223 201   Basic Metabolic Panel: Recent Labs  Lab 06/04/24 0017 06/05/24 0424 06/06/24 0857 06/07/24 0536 06/08/24 0533  NA 133* 133* 130* 132* 134*  K 4.3 3.6 3.2* 3.6 3.4*  CL 102 103 99 102 103  CO2 21* 21* 21* 21* 22  GLUCOSE 129* 100* 164* 88 88  BUN 47* 37* 29* 31* 27*  CREATININE 1.72* 1.41* 1.39* 1.29* 1.18*  CALCIUM  8.4* 8.3* 8.5* 8.7* 8.5*   GFR: Estimated Creatinine Clearance: 58.6 mL/min (A) (by C-G formula based on SCr of 1.18 mg/dL  (H)). Liver Function Tests: No results for input(s): AST, ALT, ALKPHOS, BILITOT, PROT, ALBUMIN  in the last 168 hours. No results for input(s): LIPASE, AMYLASE in the last 168 hours. No results for input(s): AMMONIA in the last 168 hours. Coagulation Profile: No results for input(s): INR, PROTIME in the last 168 hours. Cardiac Enzymes: No results for input(s): CKTOTAL, CKMB, CKMBINDEX, TROPONINI in the last 168 hours. BNP (last 3 results) No results for input(s): PROBNP in the last 8760 hours. HbA1C: No results for input(s): HGBA1C in the last 72 hours. CBG: No results for input(s): GLUCAP in the last 168 hours. Lipid Profile: No results for input(s): CHOL, HDL, LDLCALC, TRIG, CHOLHDL, LDLDIRECT in the last 72 hours. Thyroid  Function Tests: No results for input(s): TSH, T4TOTAL, FREET4, T3FREE, THYROIDAB in the last 72 hours. Anemia Panel: No results for input(s): VITAMINB12, FOLATE, FERRITIN, TIBC, IRON , RETICCTPCT in the last 72 hours. Sepsis Labs: No results for input(s): PROCALCITON, LATICACIDVEN in the last 168 hours.  Recent Results (from the past 240 hours)  Urine Culture (for pregnant, neutropenic or urologic patients or patients with an indwelling urinary catheter)     Status: None   Collection Time: 06/04/24  1:45 PM   Specimen: Urine, Clean Catch  Result Value Ref Range Status   Specimen Description   Final    URINE, CLEAN CATCH Performed at [facility], [address], [place], [place] [address]    Special Requests   Final    NONE Performed at [facility], [address] [place], [place] [address]    Culture   Final    NO GROWTH Performed at [facility], [address] [place], [place] [address]    Report Status 06/05/2024 FINAL  Final    Radiology Studies: No results found.  Scheduled Meds:  acetaminophen   1,000 mg Oral TID   amLODipine    10 mg Oral Daily   apixaban   10 mg Oral BID   Followed by   NOREEN ON 06/14/2024] apixaban   5 mg Oral BID   azaTHIOprine   100 mg Oral Daily   benzonatate   100 mg Oral TID   carvedilol   6.25 mg Oral BID WC   chlorthalidone   25 mg Oral Daily   ferrous sulfate   325 mg Oral Q breakfast   fluticasone  furoate-vilanterol  1 puff Inhalation Daily   folic acid   1 mg Oral Daily   isosorbide -hydrALAZINE   2 tablet Oral TID   levothyroxine   112 mcg Oral Q0600   pantoprazole   40 mg Oral  Daily   potassium chloride   40 mEq Oral BID   rosuvastatin   10 mg Oral Daily   sodium chloride  flush  3 mL Intravenous Q12H   Continuous Infusions:   LOS: 5 days   Alejandro Marker, DO [facility] Available via Epic secure chat 7am-7pm After these hours, please refer to coverage provider listed on [email or web address] 06/08/2024, 5:03 PM

## 2024-06-08 NOTE — Plan of Care (Signed)

## 2024-06-09 DIAGNOSIS — Z531 Procedure and treatment not carried out because of patient's decision for reasons of belief and group pressure: Secondary | ICD-10-CM

## 2024-06-09 LAB — CBC WITH DIFFERENTIAL/PLATELET
Abs Immature Granulocytes: 0.06 K/uL (ref 0.00–0.07)
Basophils Absolute: 0 K/uL (ref 0.0–0.1)
Basophils Relative: 0 %
Eosinophils Absolute: 0.1 K/uL (ref 0.0–0.5)
Eosinophils Relative: 2 %
HCT: 19.7 % — ABNORMAL LOW (ref 36.0–46.0)
Hemoglobin: 6.8 g/dL — CL (ref 12.0–15.0)
Immature Granulocytes: 1 %
Lymphocytes Relative: 12 %
Lymphs Abs: 0.5 K/uL — ABNORMAL LOW (ref 0.7–4.0)
MCH: 26.3 pg (ref 26.0–34.0)
MCHC: 34.5 g/dL (ref 30.0–36.0)
MCV: 76.1 fL — ABNORMAL LOW (ref 80.0–100.0)
Monocytes Absolute: 0.6 K/uL (ref 0.1–1.0)
Monocytes Relative: 13 %
Neutro Abs: 3.2 K/uL (ref 1.7–7.7)
Neutrophils Relative %: 72 %
Platelets: 239 K/uL (ref 150–400)
RBC: 2.59 MIL/uL — ABNORMAL LOW (ref 3.87–5.11)
RDW: 17.2 % — ABNORMAL HIGH (ref 11.5–15.5)
WBC: 4.5 K/uL (ref 4.0–10.5)
nRBC: 0 % (ref 0.0–0.2)

## 2024-06-09 LAB — COMPREHENSIVE METABOLIC PANEL WITH GFR
ALT: 17 U/L (ref 0–44)
AST: 36 U/L (ref 15–41)
Albumin: 2.8 g/dL — ABNORMAL LOW (ref 3.5–5.0)
Alkaline Phosphatase: 128 U/L — ABNORMAL HIGH (ref 38–126)
Anion gap: 11 (ref 5–15)
BUN: 23 mg/dL (ref 8–23)
CO2: 20 mmol/L — ABNORMAL LOW (ref 22–32)
Calcium: 8.7 mg/dL — ABNORMAL LOW (ref 8.9–10.3)
Chloride: 99 mmol/L (ref 98–111)
Creatinine, Ser: 1.34 mg/dL — ABNORMAL HIGH (ref 0.44–1.00)
GFR, Estimated: 45 mL/min — ABNORMAL LOW (ref >60)
Glucose, Bld: 86 mg/dL (ref 70–99)
Potassium: 3.9 mmol/L (ref 3.5–5.1)
Sodium: 131 mmol/L — ABNORMAL LOW (ref 135–145)
Total Bilirubin: 0.4 mg/dL (ref 0.0–1.2)
Total Protein: 6.2 g/dL — ABNORMAL LOW (ref 6.5–8.1)

## 2024-06-09 LAB — MAGNESIUM: Magnesium: 2 mg/dL (ref 1.7–2.4)

## 2024-06-09 LAB — PHOSPHORUS: Phosphorus: 3.3 mg/dL (ref 2.5–4.6)

## 2024-06-09 MED ORDER — ISOSORB DINITRATE-HYDRALAZINE 20-37.5 MG PO TABS
2.0000 | ORAL_TABLET | Freq: Three times a day (TID) | ORAL | Status: DC
  Administered 2024-06-09 – 2024-06-13 (×13): 2 via ORAL
  Filled 2024-06-09 (×15): qty 2

## 2024-06-09 MED ORDER — SODIUM CHLORIDE 0.9 % IV SOLN
100.0000 mg | Freq: Once | INTRAVENOUS | Status: AC
  Administered 2024-06-09: 100 mg via INTRAVENOUS
  Filled 2024-06-09: qty 5

## 2024-06-09 MED ORDER — SODIUM CHLORIDE 0.9 % IV SOLN: 1.0000 mg | Freq: Once | INTRAVENOUS | Status: DC

## 2024-06-09 MED ORDER — MELATONIN 5 MG PO TABS
5.0000 mg | ORAL_TABLET | Freq: Once | ORAL | Status: AC
  Administered 2024-06-09: 5 mg via ORAL
  Filled 2024-06-09: qty 1

## 2024-06-09 MED ORDER — EPOETIN ALFA-EPBX 10000 UNIT/ML IJ SOLN
20000.0000 [IU] | Freq: Once | INTRAMUSCULAR | Status: AC
  Administered 2024-06-09: 20000 [IU] via SUBCUTANEOUS
  Filled 2024-06-09: qty 2

## 2024-06-09 MED ORDER — EPOETIN ALFA-EPBX 20000 UNIT/ML IJ SOLN
20000.0000 [IU] | Freq: Once | INTRAMUSCULAR | Status: DC
  Filled 2024-06-09: qty 1

## 2024-06-09 MED ORDER — MELATONIN 5 MG PO TABS: 5.0000 mg | ORAL_TABLET | Freq: Every day | ORAL | Status: DC

## 2024-06-09 MED ORDER — FOLIC ACID 5 MG/ML IJ SOLN
1.0000 mg | Freq: Every day | INTRAMUSCULAR | Status: DC
  Filled 2024-06-09: qty 0.2

## 2024-06-09 MED ORDER — FOLIC ACID 5 MG/ML IJ SOLN
1.0000 mg | Freq: Once | INTRAMUSCULAR | Status: AC
  Administered 2024-06-09: 1 mg via INTRAVENOUS
  Filled 2024-06-09: qty 0.2

## 2024-06-09 MED ORDER — AMLODIPINE BESYLATE 10 MG PO TABS
10.0000 mg | ORAL_TABLET | Freq: Every day | ORAL | Status: DC
  Administered 2024-06-09 – 2024-06-13 (×5): 10 mg via ORAL
  Filled 2024-06-09 (×5): qty 1

## 2024-06-09 MED ORDER — SODIUM BICARBONATE 650 MG PO TABS
650.0000 mg | ORAL_TABLET | Freq: Two times a day (BID) | ORAL | Status: DC
  Administered 2024-06-09 – 2024-06-13 (×9): 650 mg via ORAL
  Filled 2024-06-09 (×9): qty 1

## 2024-06-09 MED ORDER — PANTOPRAZOLE SODIUM 40 MG PO TBEC
40.0000 mg | DELAYED_RELEASE_TABLET | Freq: Two times a day (BID) | ORAL | Status: DC
  Administered 2024-06-09 – 2024-06-13 (×9): 40 mg via ORAL
  Filled 2024-06-09 (×9): qty 1

## 2024-06-09 MED ADMIN — Dextromethorphan-Guaifenesin Syrup 10-100 MG/5ML: 5 mL | ORAL | NDC 00121063805

## 2024-06-09 MED FILL — Dextromethorphan-Guaifenesin Syrup 10-100 MG/5ML: 5.0000 mL | ORAL | Qty: 5 | Status: AC

## 2024-06-09 NOTE — Plan of Care (Signed)

## 2024-06-09 NOTE — Progress Notes (Signed)
 Date and time results received: 06/09/2024 0725  Test: Hgb Critical Value: 6.8  Name of Provider Notified: Dr. Sherrill  Orders Received? Or Actions Taken?: MD acknowledged and awaiting orders

## 2024-06-09 NOTE — Consult Note (Addendum)
 [facility]  [phone] [phone]    ADDENDUM:  Patient was personally and independently interviewed, examined and relevant elements of the history of present illness were reviewed in details and an assessment and plan was created. All elements of the patient's history of present illness, assessment and plan were discussed in detail with [name], NP. The above documentation reflects our combined findings assessment and plan.   Briefly, 63 year old lady who is a Jehovah's Witness, presented to the hospital on 10/03/2023 with complaints of left lower extremity pain and swelling.  Imaging indicative of DVT.  She was found to have iron  deficiency anemia with hemoglobin of 6.8, iron  decreased at 24, iron  saturation decreased at 14%.  We were consulted today for additional recommendations, since she cannot take blood products.  Agree with proceeding with IV iron , B12 and folic acid  supplementation and EPO.  She has not previously been diagnosed with anemia, but her family notes longstanding cold intolerance and fatigue, with worsening symptoms since 2020. She describes persistent cold sensation regardless of season, requiring extra clothing, and significant fatigue, somnolence, and decreased activity, including falling asleep quickly in the car and reduced driving.  She denies chest pain, hemoptysis, or overt bleeding, including melena or epistaxis. She experiences mild cough and discomfort when lying flat, which resolves when sitting up or when the room is less stuffy. She endorses occasional dyspnea, particularly when doors are closed, and intermittent dizziness and lightheadedness. She denies abdominal pain or changes in bowel habits. Colonoscopy ten years ago was normal; repeat is due in 2026.  Provided dietary counseling to increase iron  intake (green leafy vegetables, red meat, liver) and vitamin C supplementation to enhance absorption. - Advised to  report symptoms of worsening anemia (chest pain, dyspnea, dizziness, palpitations). - Deferred invasive GI evaluation until hemoglobin improves, but will pursue if ongoing or unexplained blood loss is suspected. - Coordinate with care team to minimize phlebotomy blood loss (pediatric tubes, minimal draws).  Symptoms seem to be slowly getting better with iron  replacement.  As long as hemoglobin does not drop further, if she is asymptomatic, she is okay to be discharged from hematology standpoint and I can see her in clinic for follow-up.  Patient prefers to be seen at our [place] [facility].  Will arrange appointments accordingly.  Thank you for the opportunity to participate in her care.  Please call us  with any questions.  [name], MD    HEMATOLOGY CONSULTATION  PURPOSE OF CONSULTATION/CHIEF COMPLAINT:  Acute DVT and anemia  Referring MD:  Dr. Sherrill   HPI: Evelyn Baker is a 63 year old female patient who presented on 06/03/2024 with complaints of left lower extremity pain and swelling.  Imaging was worrisome for nonocclusive thrombus.  Patient was placed on anticoagulation however blood counts continue to drop.  Patient is Jehovah witness religion and does not accept blood products. Patient seen awake and alert and oriented x 4.  She is very pleasant and is a good historian.  States that she initially began feeling bad in March 2025.  Left Leg swelling therefore went to see PCP.  States she was told it was cellulitis and was prescribed antibiotics.  Initially swelling resolved and then she noted that in November 2025 the same left leg began to hurt and swell.  This time she went to [facility] physicians and was prescribed antibiotics which she states did not work.  She was then referred to the ED and subsequently diagnosed with DVT.  Patient confirms that she initially received IV heparin  and is now on Eliquis .  Denies chest pain, shortness of breath, nausea vomiting, melena or  hematemesis.  Admits to sedentary lifestyle since retiring from [employer] 1 year ago.  Denies recent surgeries or long travel although reports traveling to [place] a few years ago.  States she was aware of need to wear compression socks during that trip. Medical history includes hypertension, hyperlipidemia, hypothyroidism, renal dysfunction, and TIA. Surgical history includes vein stripping in 2021 on the left leg, foot surgery in 2007. Family history is significant for renal dysfunction, reports her mother was on dialysis.  No oncologic family history. Social history is negative for tobacco use, alcohol use, recreational or illicit drug use.  She is married.  Retired from sedentary position at [employer].    ASSESSMENT AND PLAN:  Anemia, microcytic Iron  deficiency anemia - Hemoglobin low 6.8 today.  May also be dilutional effect. - Noted hemoglobin 8 months ago in the 12 range. - Iron  was low 23 with sat 14% low range of normal. - FOBT was negative. - Patient is Jehovah witness and does not accept blood products due to that religion. - Patient denies melena or other acute bleeding. - Status post IV iron  x 2 doses, folate injection, Retacrit  20,000 units, and B12 injection 1000 mcg.  Agree with plan. - Will monitor levels as outpatient in the hematology clinic and replete as needed.  Patient is agreeable to outpatient hematology follow-up. - Hematology/Dr. Verlia Kaney will see patient and make further recommendations.  Acute DVT, left lower extremity - Venous Dopplers done 06/03/2024 shows partial compressibility left common femoral vein, likely nonocclusive thrombus. - Patient reports no known precipitating factors, no recent long travel.  Admits to sedentary lifestyle however retired 1 year ago.  She had vein stripping in that same leg 4 years ago. - Status post IV heparin  which has now been transitioned to Eliquis .  Agree with plan for Eliquis  loading dose 10 mg twice daily followed by 5  mg twice daily.  AKI on CKD - Noted improvement in BUN and creatinine since admission - Avoid nephrotoxic agents - Recommend close outpatient nephrology follow-up  Hypertension History of CVA - Continue antihypertensives as ordered - Patient previously on Plavix  which is now on hold.  Continue statin as ordered. - Monitor BP closely   Past Medical History:  Diagnosis Date   Asthma    Depression    Dyspnea    Hyperlipidemia    Hypertension    Hypothyroidism    Sciatic nerve disease    Thyroid  disease   :  Past Surgical History:  Procedure Laterality Date   FOOT SURGERY Left    STRABISMUS SURGERY     age 68   TOTAL ABDOMINAL HYSTERECTOMY     VEIN LIGATION AND STRIPPING Left 01/22/2021   Procedure: LEFT GREATER SAPHENOUS VEIN  LIGATION AND STRIPPING;  Surgeon: Harvey Carlin BRAVO, MD;  Location: [facility] OR;  Service: Vascular;  Laterality: Left;  :  Allergies[1]:   Family History  Problem Relation Age of Onset   Heart failure Mother    Kidney failure Mother    Stroke Sister    Aneurysm Sister    Heart Problems Brother    Hypertension Son    Hypertension Daughter   :   Social History   Socioeconomic History   Marital status: Married    Spouse name: Not on file   Number of children: 4   Years of education: Not  on file   Highest education level: Not on file  Occupational History   Not on file  Tobacco Use   Smoking status: Never    Passive exposure: Never   Smokeless tobacco: Never  Vaping Use   Vaping status: Never Used  Substance and Sexual Activity   Alcohol use: No   Drug use: No   Sexual activity: Yes    Birth control/protection: Surgical    Comment: partial hysterectomy  Other Topics Concern   Not on file  Social History Narrative   Not on file   Social Drivers of Health   Tobacco Use: Low Risk (06/03/2024)   Patient History    Smoking Tobacco Use: Never    Smokeless Tobacco Use: Never    Passive Exposure: Never  Financial Resource Strain:  Not on file  Food Insecurity: No Food Insecurity (06/03/2024)   Epic    Worried About Programme Researcher, Broadcasting/film/video in the Last Year: Never true    Ran Out of Food in the Last Year: Never true  Transportation Needs: No Transportation Needs (06/03/2024)   Epic    Lack of Transportation (Medical): No    Lack of Transportation (Non-Medical): No  Physical Activity: Not on file  Stress: Not on file  Social Connections: Socially Integrated (06/04/2024)   Social Connection and Isolation Panel    Frequency of Communication with Friends and Family: More than three times a week    Frequency of Social Gatherings with Friends and Family: More than three times a week    Attends Religious Services: More than 4 times per year    Active Member of [employer] or Organizations: Yes    Attends Banker Meetings: More than 4 times per year    Marital Status: Married  Catering Manager Violence: Not At Risk (06/03/2024)   Epic    Fear of Current or Ex-Partner: No    Emotionally Abused: No    Physically Abused: No    Sexually Abused: No  Depression (PHQ2-9): Not on file  Alcohol Screen: Not on file  Housing: Low Risk (06/03/2024)   Epic    Unable to Pay for Housing in the Last Year: No    Number of Times Moved in the Last Year: 0    Homeless in the Last Year: No  Utilities: Not At Risk (06/03/2024)   Epic    Threatened with loss of utilities: No  Health Literacy: Not on file  :   CURRENT MEDS: Current Facility-Administered Medications  Medication Dose Route Frequency Provider Last Rate Last Admin   acetaminophen  (TYLENOL ) tablet 650 mg  650 mg Oral Q6H PRN Patel, Vishal R, MD       Or   acetaminophen  (TYLENOL ) suppository 650 mg  650 mg Rectal Q6H PRN Patel, Vishal R, MD       acetaminophen  (TYLENOL ) tablet 1,000 mg  1,000 mg Oral TID Sebastian Toribio GAILS, MD   1,000 mg at 06/09/24 9075   albuterol  (PROVENTIL ) (2.5 MG/3ML) 0.083% nebulizer solution 2.5 mg  2.5 mg Nebulization Q6H PRN Tobie Jorie SAUNDERS, MD        amLODipine  (NORVASC ) tablet 10 mg  10 mg Oral Daily Sebastian Toribio GAILS, MD   10 mg at 06/09/24 9076   apixaban  (ELIQUIS ) tablet 10 mg  10 mg Oral BID Utomwen, Adesuwa, RPH   10 mg at 06/09/24 9081   Followed by   NOREEN ON 06/14/2024] apixaban  (ELIQUIS ) tablet 5 mg  5 mg Oral BID Utomwen, Adesuwa, RPH  azaTHIOprine  (IMURAN ) tablet 100 mg  100 mg Oral Daily Sebastian Toribio GAILS, MD   100 mg at 06/09/24 9075   benzonatate  (TESSALON ) capsule 100 mg  100 mg Oral TID Sebastian Toribio GAILS, MD   100 mg at 06/09/24 9073   carvedilol  (COREG ) tablet 6.25 mg  6.25 mg Oral BID WC SheikhAlejandro [place], DO   6.25 mg at 06/09/24 0930   chlorthalidone  (HYGROTON ) tablet 25 mg  25 mg Oral Daily Patel, Vishal R, MD   25 mg at 06/08/24 9073   ferrous sulfate  tablet 325 mg  325 mg Oral Q breakfast Sebastian Toribio GAILS, MD   325 mg at 06/09/24 9070   fluticasone  furoate-vilanterol (BREO ELLIPTA ) 100-25 MCG/ACT 1 puff  1 puff Inhalation Daily Tobie Jorie SAUNDERS, MD       iron  sucrose (VENOFER ) 100 mg in sodium chloride  0.9 % 100 mL IVPB  100 mg Intravenous Once Sherrill Alejandro [place], DO 420 mL/hr at 06/09/24 9047 100 mg at 06/09/24 9047   isosorbide -hydrALAZINE  (BIDIL ) 20-37.5 MG per tablet 2 tablet  2 tablet Oral TID Sebastian Toribio GAILS, MD   2 tablet at 06/09/24 9073   labetalol (NORMODYNE) injection 10 mg  10 mg Intravenous Q2H PRN Sheikh, Omair Latif, DO       levothyroxine  (SYNTHROID ) tablet 112 mcg  112 mcg Oral Q0600 Patel, Vishal R, MD   112 mcg at 06/09/24 9462   ondansetron  (ZOFRAN ) tablet 4 mg  4 mg Oral Q6H PRN Tobie Jorie SAUNDERS, MD       Or   ondansetron  (ZOFRAN ) injection 4 mg  4 mg Intravenous Q6H PRN Tobie Jorie SAUNDERS, MD       pantoprazole  (PROTONIX ) EC tablet 40 mg  40 mg Oral BID Sheikh, Omair [place], DO   40 mg at 06/09/24 9075   rosuvastatin  (CRESTOR ) tablet 10 mg  10 mg Oral Daily Patel, Vishal R, MD   10 mg at 06/09/24 9076   senna-docusate (Senokot-S) tablet 1 tablet  1 tablet Oral QHS PRN Patel, Vishal  R, MD       sodium bicarbonate tablet 650 mg  650 mg Oral BID Sheikh, Omair Latif, DO   650 mg at 06/09/24 9068   sodium chloride  flush (NS) 0.9 % injection 3 mL  3 mL Intravenous Q12H Tobie Jorie SAUNDERS, MD   3 mL at 06/09/24 9071    PHYSICAL EXAMINATION: ECOG PERFORMANCE STATUS: 1 - Symptomatic but completely ambulatory  Vitals:   06/09/24 0525 06/09/24 0923  BP: (!) 147/85 (!) 169/107  Pulse: 99   Resp: 20   Temp: 98.3 F (36.8 C)   SpO2: 99%    Filed Weights   06/07/24 0500 06/08/24 0500 06/09/24 0525  Weight: 223 lb (101.2 kg) 220 lb 9.6 oz (100.1 kg) 210 lb 8 oz (95.5 kg)    GENERAL: alert, no distress and comfortable SKIN: +Dry hyperpigmented skin over left lower extremity  EYES: normal, conjunctiva are pink and non-injected, sclera clear OROPHARYNX: no exudate, no erythema and lips, buccal mucosa, and tongue normal  NECK: supple, thyroid  normal size, non-tender, without nodularity LYMPH: no palpable lymphadenopathy in the cervical, axillary or inguinal LUNGS: clear to auscultation and percussion with normal breathing effort HEART: regular rate & rhythm and no murmurs and +LLE 2+ ABDOMEN: abdomen soft, non-tender and normal bowel sounds MUSCULOSKELETAL: no cyanosis of digits and no clubbing  PSYCH: alert & oriented x 3 with fluent speech NEURO: no focal motor/sensory deficits   LABS: Lab Results  Component  Value Date   WBC 4.5 06/09/2024   HGB 6.8 (LL) 06/09/2024   HCT 19.7 (L) 06/09/2024   MCV 76.1 (L) 06/09/2024   PLT 239 06/09/2024    Lab Results  Component Value Date   WBC 4.5 06/09/2024   HGB 6.8 (LL) 06/09/2024   HCT 19.7 (L) 06/09/2024   PLT 239 06/09/2024   GLUCOSE 86 06/09/2024   CHOL 147 10/23/2023   TRIG 68 10/23/2023   HDL 53 10/23/2023   LDLDIRECT 92 04/21/2023   LDLCALC 80 10/23/2023   ALT 17 06/09/2024   AST 36 06/09/2024   NA 131 (L) 06/09/2024   K 3.9 06/09/2024   CL 99 06/09/2024   CREATININE 1.34 (H) 06/09/2024   BUN 23 06/09/2024    CO2 20 (L) 06/09/2024    NM Pulmonary Perfusion Result Date: 06/06/2024 CLINICAL DATA:  Shortness of breath on minimal exertion. Deep venous thrombosis. EXAM: NUCLEAR MEDICINE PERFUSION LUNG SCAN TECHNIQUE: Perfusion images were obtained in multiple projections after intravenous injection of radiopharmaceutical. No ventilation imaging obtained. RADIOPHARMACEUTICALS:  4.4 mCi Tc-73m MAA IV COMPARISON:  Chest radiographs 06/05/2024.  Chest CT 03/22/2024 FINDINGS: The pulmonary perfusion is within normal limits. There are no wedge-shaped perfusion defects to suggest acute pulmonary embolism. IMPRESSION: No evidence of acute pulmonary embolism on perfusion scintigraphy by PISAPED criteria. Electronically Signed   By: Elsie Perone M.D.   On: 06/06/2024 15:36   DG CHEST PORT 1 VIEW Result Date: 06/05/2024 EXAM: 1 VIEW(S) XRAY OF THE CHEST 06/05/2024 08:56:00 PM COMPARISON: 06/04/2024 CLINICAL HISTORY: Respiratory abnormality FINDINGS: LUNGS AND PLEURA: Mild pulmonary edema. Mild bilateral interstitial prominence. Mild bilateral peribronchial cuffing. No pleural effusion. No pneumothorax. HEART AND MEDIASTINUM: Borderline cardiomegaly. No acute abnormality of the mediastinal silhouette. BONES AND SOFT TISSUES: No acute osseous abnormality. IMPRESSION: 1. Mild pulmonary edema. Electronically signed by: Morgane Naveau MD 06/05/2024 11:15 PM EST RP Workstation: [id number]   US  RENAL Result Date: 06/04/2024 EXAM: US  Retroperitoneum Complete, Renal. 06/04/2024 05:56:29 PM TECHNIQUE: Real-time ultrasonography of the retroperitoneum renal was performed. COMPARISON: None available CLINICAL HISTORY: Acute renal failure. FINDINGS: FINDINGS: RIGHT KIDNEY/URETER: Right kidney measures 12.5 x 4.9 x 5.7 cm. Mildly increased echotexture. No hydronephrosis. No calculus. No mass. LEFT KIDNEY/URETER: Left kidney measures 11.5 x 5.4 x 4.5 cm. Mildly increased echotexture. No hydronephrosis. No calculus. No mass. BLADDER:  Unremarkable appearance of the bladder. IMPRESSION: 1. Mildly increased renal cortical echotexture bilaterally, which can be seen with medical renal disease or acute kidney injury. Electronically signed by: Franky Crease MD 06/04/2024 07:41 PM EST RP Workstation: [id number]   DG Chest Port 1 View Result Date: 06/04/2024 EXAM: 1 VIEW(S) XRAY OF THE CHEST 06/04/2024 09:11:00 AM COMPARISON: 03/20/2024 CLINICAL HISTORY: Shortness of breath. FINDINGS: LUNGS AND PLEURA: Airway thickening suggests bronchitis or reactive airways disease. Mild upper zone pulmonary vascular prominence, cannot exclude pulmonary venous hypertension. No focal pulmonary opacity. No pleural effusion. No pneumothorax. HEART AND MEDIASTINUM: Mild cardiomegaly. No acute abnormality of the mediastinal silhouette. BONES AND SOFT TISSUES: No acute osseous abnormality. IMPRESSION: 1. Airway thickening suggestive of bronchitis or reactive airways disease. 2. Mild upper zone pulmonary vascular prominence, cannot exclude pulmonary venous hypertension. 3. Mild cardiomegaly. Electronically signed by: Ryan Salvage MD 06/04/2024 01:17 PM EST RP Workstation: [id number]   US  Venous Img Lower  Left (DVT Study) Result Date: 06/03/2024 CLINICAL DATA:  left leg edema EXAM: LEFT LOWER EXTREMITY VENOUS DOPPLER ULTRASOUND TECHNIQUE: Gray-scale sonography with graded compression, as well as color Doppler and duplex ultrasound were performed to  evaluate the lower extremity deep venous systems from the level of the common femoral vein and including the common femoral, femoral, profunda femoral, popliteal and calf veins including the posterior tibial, peroneal and gastrocnemius veins when visible. The superficial great saphenous vein was also interrogated. Spectral Doppler was utilized to evaluate flow at rest and with distal augmentation maneuvers in the common femoral, femoral and popliteal veins. COMPARISON:  None Available. FINDINGS: Contralateral Common  Femoral Vein: Respiratory phasicity is normal and symmetric with the symptomatic side. No evidence of thrombus. Normal compressibility. Common Femoral Vein: Partial compressibility of the common femoral vein. Vascular Doppler flow is present. Saphenofemoral Junction: No evidence of thrombus. Normal compressibility and flow on color Doppler imaging. Profunda Femoral Vein: No evidence of thrombus. Normal compressibility and flow on color Doppler imaging. Femoral Vein: No evidence of thrombus. Normal compressibility, respiratory phasicity and response to augmentation. Popliteal Vein: No evidence of thrombus. Normal compressibility, respiratory phasicity and response to augmentation. Calf Veins: No evidence of thrombus. Normal compressibility and flow on color Doppler imaging. Superficial Great Saphenous Vein: No evidence of thrombus. Normal compressibility. Other Findings:  None. IMPRESSION: Partial compressibility of the left common femoral vein, worrisome for a nonocclusive thrombus. Electronically Signed   By: Rogelia Myers M.D.   On: 06/03/2024 16:20     The total time spent in the appointment was 55 minutes encounter with patients including review of chart and various tests results, discussions about plan of care and coordination of care plan   All questions were answered. The patient knows to call the clinic with any problems, questions or concerns. No barriers to learning was detected.  Thank you for the courtesy of this consultation, Olam PARAS Rouson, NP  12/11/20259:59 AM         [1]  Allergies Allergen Reactions   Shellfish Allergy Anaphylaxis and Swelling   Avocado Swelling    Lips   Codeine  Other (See Comments)    Constipation   Iodinated Contrast Media Other (See Comments)   Pineapple Swelling    Lip swelling    Penicillins Rash    Reaction: Childhood

## 2024-06-09 NOTE — Progress Notes (Signed)
 PROGRESS NOTE    Evelyn Baker  FMW:[id number] DOB: 1960-10-17 DOA: 06/03/2024 PCP: Katina Pfeiffer, PA-C   Brief Narrative:  Evelyn Baker is a 63 y.o. 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 Hgb Trended down but no active Sx of bleeding. Started IV Iron  and sq EPO. Hematology consulted for further evaluation and recommendations.  Assessment and Plan:  Acute Left, Femoral Vein DVT: Patient denies any prior history of DVT, no recent surgeries.  Patient stated had a 4-hour car ride recently. -Patient noted the morning of 06/04/2024 with complaints of shortness of breath per RN.   -Patient denies any further significant shortness of breath.  -VQ scan done and showed that the pulmonary perfusion is within normal limits. There are no wedge-shaped perfusion defects to suggest acute pulmonary embolism.. -Patient was on IV heparin  however heparin  levels could not be obtained this morning as pediatric tubes were not available.  As patient states Jehovah's Witness was concerned about her hemoglobin levels. Discontinued IV heparin  and placed on full dose Lovenox  and subsequently transitioned to Apixaban  on 06/07/24. -Keep left lower extremity elevated.   -Continue scheduled Acetaminophen  1000 mg po TID for pain control.   -Hematology consulted and she will need to see them in outpatient setting for Hypercoaguable workup   AKI on CKD Stage 3a / Metabolic Acidosis: Patient noted to have a creatinine of 1.80 on admission compared to previous baseline of approximately 1.0-1.1. Per chart review patient with a creatinine 1.07 on 04/15/2024. Etiology of AKI likely multifactorial secondary to prerenal azotemia due to decreased oral intake in the setting of ARB. Urinalysis with 100 protein, small leukocytes, nitrite negative, rare bacteria, 11-20 WBCs. Urine sodium at 59,  urine creatinine at 83. BUN/Cr Trend: Recent Labs  Lab 06/03/24 1423 06/04/24 0017 06/05/24 0424 06/06/24 0857 06/07/24 0536 06/08/24 0533 06/09/24 0554  BUN 46* 47* 37* 29* 31* 27* 23  CREATININE 1.80* 1.72* 1.41* 1.39* 1.29* 1.18* 1.34*  -Has a slight metabolic acidosis with a CO2 of 20, anion gap of 11, chloride level of 99 -Continue to hold ARB and likely will not resume on discharge. Saline lock IV fluids.  -Avoid Nephrotoxic Medications, Contrast Dyes, Hypotension and Dehydration to Ensure Adequate Renal Perfusion and will need to Renally Adjust Meds. CTM and Trend Renal Function carefully and repeat CMP in the AM  -Will need outpt F/U w/ Nephrology   Acute Microcytic Anemia:  Hemoglobin noted to 8.3 on admission with prior hemoglobin of 10.0 03/20/2024 and prior to that 8.5 on 03/15/2024 but back in April of this year was 12.1. Hgb/Hct continues to Trend down but Patient with no overt bleeding as current trend shows:  Recent Labs  Lab 06/03/24 1423 06/04/24 0017 06/05/24 0424 06/06/24 0857 06/07/24 0536 06/08/24 0533 06/09/24 0554  HGB 8.3* 7.8* 7.2* 8.0* 7.2* 6.8* 6.8*  HCT 22.5* 21.5* 19.7* 21.4* 20.0* 19.4* 19.7*  MCV 72.3* 75.2* 72.7* 72.5* 73.0* 75.2* 76.1*  -FOBT negative. Anemia panel with iron  level of 23, TIBC of 165, ferritin of 473, folate of 7.5. Hemoglobin currently at 6.8 today, partly dilutional. Patient is a Jehovah's Witness and as such does not accept blood products. Patient agreeable to IV iron  so gave her a dose of Iron  Sucrose 200 mg x1 today and Epoetin  Alfa-epbx 5,000 units on 12/10. Today (12/11 gave her a dose of Iron  Sucrose 100 mg x1,  IV Folic Acid  1 mg, and Epoetin  Alfa-epbx 20,000 units x1.  -Hematology consulted for further evaluation and in agreement with current management. Patient to follow up with Hematology as an outpatient eventually    Hypokalemia: K+ is now 3.9. CTM and Replete as Necessary.   HypoNatremia: Na+ Trend:  Recent Labs  Lab  06/03/24 1423 06/04/24 0017 06/05/24 0424 06/06/24 0857 06/07/24 0536 06/08/24 0533 06/09/24 0554  NA 135 133* 133* 130* 132* 134* 131*  -CTM and Trend and Repeat CMP in the AM    Essential Hypertension: ARB on hold secondary to AKI. Continue BiDil  20-37.5 mg per tab 2 tabs 3 times daily. C/w Amlodipine  10 mg po Daily and Chlorthalidone  25 mg po Daily. CTM BP per Protocol. Last BP reading was elevated to 154/104. Will also add Carvedilol  6.25 mg po BID. Add IV Labetalol 10 mg q2hprn for SBP >180 or DBP >100. Outpatient follow-up with Cardiology.   History of CVA: Plavix  on hold as patient currently on full dose anticoagulation. Continue Rosuvastatin  10 mg po Daily.   Nausea and vomiting: Mild.  Continue with antiemetics w/ Ondansetron  4 mg po/IV q6hprn   Hypothyroidism: C/w Levothyroxine  112 mcg po Daily     Hyperlipidemia: C/w Rosuvastatin  10 mg po Daily    Mixed connective tissue disease/osteoarthritis -Patient being followed by rheumatology, Dr. Dolphus and managed on Azathioprine  100g mg po daily in the outpatient setting. Patient states Imuran  held recently while on antibiotics but Resumed Imuran . Will need Outpatient follow-up with Rheumatology.   Asthma: Stable C/w Symbicort  substitution with Fluticasone  Furoate-Vilanterol 1 puff IH Daily and Albuterol  as needed.   Hypoalbuminemia: Patient's Albumin  Lvl is now 2.8. CTM & Trend & repeat CMP int he AM  Class II Obesity: Complicates overall prognosis and care. Estimated body mass index is 35.03 kg/m as calculated from the following:   Height as of this encounter: 5' 5 (1.651 m).   Weight as of this encounter: 95.5 kg. Weight Loss and Dietary Counseling given   DVT prophylaxis:  apixaban  (ELIQUIS ) tablet 10 mg  apixaban  (ELIQUIS ) tablet 5 mg    Code Status: Full Code Family Communication: D/w Husband @ bedside  Disposition Plan:  Level of care: Telemetry Status is: Inpatient Remains inpatient appropriate because: Needs  is further clinical improvement in her hemoglobin and stabilization.   Consultants:  Hematology  Procedures:  As delineated as above  Antimicrobials:  Anti-infectives (From admission, onward)    None       Subjective: Seen and examined at bedside and she had some nausea and vomiting earlier but feels better now.  No lightheadedness or dizziness.  Denies any blood in her vomit.  Not noticed any blood in her stools or urine.  No chest pain or lightheadedness.  Feels okay otherwise.  Objective: Vitals:   06/09/24 0525 06/09/24 0923 06/09/24 1252 06/09/24 1618  BP: (!) 147/85 (!) 169/107 130/80 (!) 151/104  Pulse: 99  (!) 103 (!) 101  Resp: 20  16   Temp: 98.3 F (36.8 C)  98 F (36.7 C)   TempSrc: Oral     SpO2: 99%  99%   Weight: 95.5 kg     Height:        Intake/Output Summary (Last 24 hours) at 06/09/2024 1736 Last data filed at 06/09/2024 1046 Gross per 24 hour  Intake 2025 ml  Output 750 ml  Net 1275 ml   Filed Weights   06/07/24 0500 06/08/24 0500 06/09/24 0525  Weight: 101.2 kg 100.1 kg  95.5 kg   Examination: Physical Exam:  Constitutional: WN/WD obese African-American female no acute distress Respiratory: Diminished to auscultation bilaterally, no wheezing, rales, rhonchi or crackles. Normal respiratory effort and patient is not tachypenic. No accessory muscle use.  Unlabored breathing Cardiovascular: Slightly tachycardic but regular rhythm, no murmurs / rubs / gallops. S1 and S2 auscultated.  Some worsening leg edema on the left compared to the right.  Pedal pulses a 3 out of 4 Abdomen: Soft, non-tender, distended secondary to body habitus. Bowel sounds positive.  GU: Deferred. Musculoskeletal: No clubbing / cyanosis of digits/nails. No joint deformity upper and lower extremities but left leg is swollen compared to the right Skin: No rashes, lesions, ulcers limited skin evaluation. No induration; Warm and dry.  Neurologic: CN 2-12 grossly intact with no  focal deficits. Romberg sign and cerebellar reflexes not assessed.  Psychiatric: Normal judgment and insight. Alert and oriented x 3. Normal mood and appropriate affect.   Data Reviewed: I have personally reviewed following labs and imaging studies  CBC: Recent Labs  Lab 06/03/24 1423 06/04/24 0017 06/05/24 0424 06/06/24 0857 06/07/24 0536 06/08/24 0533 06/09/24 0554  WBC 7.2   < > 7.4 7.9 6.7 5.1 4.5  NEUTROABS 6.0  --  5.8  --   --   --  3.2  HGB 8.3*   < > 7.2* 8.0* 7.2* 6.8* 6.8*  HCT 22.5*   < > 19.7* 21.4* 20.0* 19.4* 19.7*  MCV 72.3*   < > 72.7* 72.5* 73.0* 75.2* 76.1*  PLT 218   < > 163 195 223 201 239   < > = values in this interval not displayed.   Basic Metabolic Panel: Recent Labs  Lab 06/05/24 0424 06/06/24 0857 06/07/24 0536 06/08/24 0533 06/09/24 0554  NA 133* 130* 132* 134* 131*  K 3.6 3.2* 3.6 3.4* 3.9  CL 103 99 102 103 99  CO2 21* 21* 21* 22 20*  GLUCOSE 100* 164* 88 88 86  BUN 37* 29* 31* 27* 23  CREATININE 1.41* 1.39* 1.29* 1.18* 1.34*  CALCIUM  8.3* 8.5* 8.7* 8.5* 8.7*  MG  --   --   --   --  2.0  PHOS  --   --   --   --  3.3   GFR: Estimated Creatinine Clearance: 50.4 mL/min (A) (by C-G formula based on SCr of 1.34 mg/dL (H)). Liver Function Tests: Recent Labs  Lab 06/09/24 0554  AST 36  ALT 17  ALKPHOS 128*  BILITOT 0.4  PROT 6.2*  ALBUMIN  2.8*   No results for input(s): LIPASE, AMYLASE in the last 168 hours. No results for input(s): AMMONIA in the last 168 hours. Coagulation Profile: No results for input(s): INR, PROTIME in the last 168 hours. Cardiac Enzymes: No results for input(s): CKTOTAL, CKMB, CKMBINDEX, TROPONINI in the last 168 hours. BNP (last 3 results) No results for input(s): PROBNP in the last 8760 hours. HbA1C: No results for input(s): HGBA1C in the last 72 hours. CBG: No results for input(s): GLUCAP in the last 168 hours. Lipid Profile: No results for input(s): CHOL, HDL, LDLCALC,  TRIG, CHOLHDL, LDLDIRECT in the last 72 hours. Thyroid  Function Tests: No results for input(s): TSH, T4TOTAL, FREET4, T3FREE, THYROIDAB in the last 72 hours. Anemia Panel: No results for input(s): VITAMINB12, FOLATE, FERRITIN, TIBC, IRON , RETICCTPCT in the last 72 hours. Sepsis Labs: No results for input(s): PROCALCITON, LATICACIDVEN in the last 168 hours.  Recent Results (from the past 240 hours)  Urine Culture (for pregnant, neutropenic  or urologic patients or patients with an indwelling urinary catheter)     Status: None   Collection Time: 06/04/24  1:45 PM   Specimen: Urine, Clean Catch  Result Value Ref Range Status   Specimen Description   Final    URINE, CLEAN CATCH Performed at [facility], [address] [place], [place] [address]    Special Requests   Final    NONE Performed at [facility], [address] [place], [place] [address]    Culture   Final    NO GROWTH Performed at [facility], [address] [place], [place] [address]    Report Status 06/05/2024 FINAL  Final    Radiology Studies: No results found.  Scheduled Meds:  acetaminophen   1,000 mg Oral TID   amLODipine   10 mg Oral Daily   apixaban   10 mg Oral BID   Followed by   NOREEN ON 06/14/2024] apixaban   5 mg Oral BID   azaTHIOprine   100 mg Oral Daily   benzonatate   100 mg Oral TID   carvedilol   6.25 mg Oral BID WC   chlorthalidone   25 mg Oral Daily   ferrous sulfate   325 mg Oral Q breakfast   fluticasone  furoate-vilanterol  1 puff Inhalation Daily   isosorbide -hydrALAZINE   2 tablet Oral TID   levothyroxine   112 mcg Oral Q0600   pantoprazole   40 mg Oral BID   rosuvastatin   10 mg Oral Daily   sodium bicarbonate  650 mg Oral BID   sodium chloride  flush  3 mL Intravenous Q12H   Continuous Infusions:   LOS: 6 days   Alejandro Marker, DO [facility] Available via Epic secure chat 7am-7pm After these hours, please  refer to coverage provider listed on [email or web address] 06/09/2024, 5:36 PM

## 2024-06-09 NOTE — TOC Progression Note (Signed)
 Transition of Care [facility]) - Progression Note    Patient Details  Name: Evelyn Baker MRN: [id number] Date of Birth: 07-17-1960  Transition of [facility]) CM/SW Contact  Sonda Manuella Quill, RN Phone Number: 06/09/2024, 9:30 AM  Clinical Narrative:    Chart reviewed; no needs identified; IP CM is following.                     Expected Discharge Plan and Services                                               Social Drivers of Health (SDOH) Interventions SDOH Screenings   Food Insecurity: No Food Insecurity (06/03/2024)  Housing: Low Risk (06/03/2024)  Transportation Needs: No Transportation Needs (06/03/2024)  Utilities: Not At Risk (06/03/2024)  Social Connections: Socially Integrated (06/04/2024)  Tobacco Use: Low Risk (06/03/2024)    Readmission Risk Interventions    06/06/2024    3:35 PM  Readmission Risk Prevention Plan  Transportation Screening Complete  PCP or Specialist Appt within 5-7 Days Complete  Home Care Screening Complete  Medication Review (RN CM) Complete

## 2024-06-10 ENCOUNTER — Inpatient Hospital Stay (HOSPITAL_COMMUNITY)

## 2024-06-10 ENCOUNTER — Other Ambulatory Visit: Payer: Self-pay | Admitting: Oncology

## 2024-06-10 DIAGNOSIS — D509 Iron deficiency anemia, unspecified: Secondary | ICD-10-CM

## 2024-06-10 LAB — CBC WITH DIFFERENTIAL/PLATELET
Abs Immature Granulocytes: 0.08 K/uL — ABNORMAL HIGH (ref 0.00–0.07)
Basophils Absolute: 0 K/uL (ref 0.0–0.1)
Basophils Relative: 0 %
Eosinophils Absolute: 0.1 K/uL (ref 0.0–0.5)
Eosinophils Relative: 2 %
HCT: 18 % — ABNORMAL LOW (ref 36.0–46.0)
Hemoglobin: 6.6 g/dL — CL (ref 12.0–15.0)
Immature Granulocytes: 2 %
Lymphocytes Relative: 11 %
Lymphs Abs: 0.6 K/uL — ABNORMAL LOW (ref 0.7–4.0)
MCH: 26.8 pg (ref 26.0–34.0)
MCHC: 36.7 g/dL — ABNORMAL HIGH (ref 30.0–36.0)
MCV: 73.2 fL — ABNORMAL LOW (ref 80.0–100.0)
Monocytes Absolute: 0.6 K/uL (ref 0.1–1.0)
Monocytes Relative: 11 %
Neutro Abs: 4 K/uL (ref 1.7–7.7)
Neutrophils Relative %: 74 %
Platelets: 208 K/uL (ref 150–400)
RBC: 2.46 MIL/uL — ABNORMAL LOW (ref 3.87–5.11)
RDW: 16.8 % — ABNORMAL HIGH (ref 11.5–15.5)
WBC: 5.4 K/uL (ref 4.0–10.5)
nRBC: 0 % (ref 0.0–0.2)

## 2024-06-10 LAB — MAGNESIUM: Magnesium: 2 mg/dL (ref 1.7–2.4)

## 2024-06-10 LAB — COMPREHENSIVE METABOLIC PANEL WITH GFR
ALT: 32 U/L (ref 0–44)
AST: 53 U/L — ABNORMAL HIGH (ref 15–41)
Albumin: 2.9 g/dL — ABNORMAL LOW (ref 3.5–5.0)
Alkaline Phosphatase: 182 U/L — ABNORMAL HIGH (ref 38–126)
Anion gap: 10 (ref 5–15)
BUN: 26 mg/dL — ABNORMAL HIGH (ref 8–23)
CO2: 22 mmol/L (ref 22–32)
Calcium: 8.6 mg/dL — ABNORMAL LOW (ref 8.9–10.3)
Chloride: 97 mmol/L — ABNORMAL LOW (ref 98–111)
Creatinine, Ser: 1.43 mg/dL — ABNORMAL HIGH (ref 0.44–1.00)
GFR, Estimated: 42 mL/min — ABNORMAL LOW (ref >60)
Glucose, Bld: 103 mg/dL — ABNORMAL HIGH (ref 70–99)
Potassium: 3.7 mmol/L (ref 3.5–5.1)
Sodium: 129 mmol/L — ABNORMAL LOW (ref 135–145)
Total Bilirubin: 0.3 mg/dL (ref 0.0–1.2)
Total Protein: 6 g/dL — ABNORMAL LOW (ref 6.5–8.1)

## 2024-06-10 LAB — PHOSPHORUS: Phosphorus: 3.3 mg/dL (ref 2.5–4.6)

## 2024-06-10 MED ORDER — SODIUM CHLORIDE 0.9 % IV SOLN
100.0000 mg | Freq: Once | INTRAVENOUS | Status: AC
  Administered 2024-06-10: 100 mg via INTRAVENOUS
  Filled 2024-06-10: qty 5

## 2024-06-10 MED ORDER — FUROSEMIDE 10 MG/ML IJ SOLN
20.0000 mg | Freq: Once | INTRAMUSCULAR | Status: AC
  Administered 2024-06-10: 20 mg via INTRAVENOUS
  Filled 2024-06-10: qty 2

## 2024-06-10 MED ORDER — FERROUS SULFATE 325 (65 FE) MG PO TABS
325.0000 mg | ORAL_TABLET | Freq: Three times a day (TID) | ORAL | Status: DC
  Administered 2024-06-10 – 2024-06-13 (×9): 325 mg via ORAL
  Filled 2024-06-10 (×9): qty 1

## 2024-06-10 MED ORDER — FOLIC ACID 5 MG/ML IJ SOLN
1.0000 mg | Freq: Once | INTRAMUSCULAR | Status: AC
  Administered 2024-06-10: 1 mg via INTRAVENOUS
  Filled 2024-06-10: qty 0.2

## 2024-06-10 MED ORDER — FUROSEMIDE 10 MG/ML IJ SOLN: 40.0000 mg | Freq: Once | INTRAMUSCULAR | Status: DC

## 2024-06-10 MED ORDER — EPOETIN ALFA-EPBX 20000 UNIT/ML IJ SOLN
20000.0000 [IU] | Freq: Once | INTRAMUSCULAR | Status: AC
  Administered 2024-06-10: 20000 [IU] via SUBCUTANEOUS
  Filled 2024-06-10: qty 1

## 2024-06-10 MED ADMIN — Dextromethorphan-Guaifenesin Syrup 10-100 MG/5ML: 5 mL | ORAL | NDC 00121063805

## 2024-06-10 NOTE — Plan of Care (Signed)

## 2024-06-10 NOTE — Plan of Care (Signed)

## 2024-06-10 NOTE — Consult Note (Signed)
 Referring Provider: [facility] Primary Care Physician:  Katina Pfeiffer, PA-C Primary Gastroenterologist: [place]  Reason for Consultation: Anemia, hemoglobin 6.6, Jehovah's Witness  HPI: Evelyn Baker is a 63 y.o. female who is a Tefl Teacher Witness with past medical history of CVA on Plavix , history of hypertension and hypothyroidism presented to the hospital with lower extremity pain and swelling.  She was subsequently diagnosed with DVT and was started on anticoagulation.  She was found to have drop in hemoglobin to 6.6 this morning.  Hemoglobin was normal in April 2025 but has gradually dropped since September 2025.  FOBT negative.  Mildly elevated LFTs with AST of 53, alk phos 182, normal ALT and T. bili.  GI is consulted for further evaluation.  Iron  studies showed low iron , low TIBC and elevated ferritin.  Patient seen and examined at bedside.  Patient's husband at bedside as well as 2 family members were on the phone while my history taking.  Patient denies any GI symptoms.  She denies seeing any blood in the stool or black stool.  Last colonoscopy was around 10 years ago.    Past Medical History:  Diagnosis Date   Asthma    Depression    Dyspnea    Hyperlipidemia    Hypertension    Hypothyroidism    Sciatic nerve disease    Thyroid  disease     Past Surgical History:  Procedure Laterality Date   FOOT SURGERY Left    STRABISMUS SURGERY     age 89   TOTAL ABDOMINAL HYSTERECTOMY     VEIN LIGATION AND STRIPPING Left 01/22/2021   Procedure: LEFT GREATER SAPHENOUS VEIN  LIGATION AND STRIPPING;  Surgeon: Harvey Carlin BRAVO, MD;  Location: [facility] OR;  Service: Vascular;  Laterality: Left;    Prior to Admission medications  Medication Sig Start Date End Date Taking? Authorizing Provider  albuterol  (VENTOLIN  HFA) 108 (90 Base) MCG/ACT inhaler Inhale 2 puffs into the lungs every 4 (four) hours as needed for wheezing or shortness of breath. 01/22/21  Yes Bethanie Cough, PA-C  azathioprine   (IMURAN ) 100 MG tablet Take 1 tablet (100 mg total) by mouth daily as directed 04/25/24  Yes Deveshwar, Maya, MD  budesonide -formoterol  (SYMBICORT ) 80-4.5 MCG/ACT inhaler Inhale 2 puffs into the lungs in the morning and at bedtime. Patient taking differently: Inhale 2 puffs into the lungs daily as needed (wheezing/sob). 08/12/23  Yes Geronimo Amel, MD  chlorthalidone  (HYGROTON ) 25 MG tablet Take 1 tablet (25 mg total) by mouth daily. 01/14/24  Yes Tolia, Sunit, DO  clopidogrel  (PLAVIX ) 75 MG tablet Take 1 tablet (75 mg total) by mouth daily. 06/01/24 06/01/25 Yes Rosemarie Eather RAMAN, MD  doxycycline  (VIBRAMYCIN ) 100 MG capsule Take 1 capsule (100 mg total) by mouth 2 (two) times daily. 05/30/24  Yes   EPINEPHrine  0.3 mg/0.3 mL IJ SOAJ injection Inject 0.3 mg into the muscle as needed as directed. Patient taking differently: Inject 0.3 mg into the muscle as needed for anaphylaxis. 02/11/24  Yes   hydrALAZINE  (APRESOLINE ) 25 MG tablet Take 1 tablet (25 mg) once daily as needed for systolic blood pressure > 170 Patient taking differently: Take 25 mg by mouth daily. 06/03/24  Yes Weaver, Scott T, PA-C  isosorbide -hydrALAZINE  (BIDIL ) 20-37.5 MG tablet Take 1 tablet by mouth 3 (three) times daily. 06/02/24  Yes Tolia, Sunit, DO  levothyroxine  (SYNTHROID ) 112 MCG tablet Take 1 tablet (112 mcg total) by mouth in the morning on an empty stomach. 11/09/23  Yes   potassium chloride  (MICRO-K ) 10 MEQ  CR capsule Take 1 capsule (10 mEq total) by mouth 2 (two) times daily. 04/04/24  Yes Tolia, Sunit, DO  rosuvastatin  (CRESTOR ) 10 MG tablet Take 1 tablet (10 mg total) by mouth daily. 04/06/24 07/05/24 Yes Tolia, Sunit, DO  valsartan  (DIOVAN ) 160 MG tablet Take 1 tablet (160 mg total) by mouth 2 (two) times daily. 04/06/24  Yes Tolia, Sunit, DO  benzonatate  (TESSALON ) 100 MG capsule Take 1 capsule (100 mg total) by mouth every 8 (eight) hours. Patient not taking: Reported on 06/04/2024 03/20/24   Desiderio Chew, PA-C  doxycycline   (VIBRA -TABS) 100 MG tablet Take 1 tablet (100 mg total) by mouth 2 (two) times daily. Patient not taking: Reported on 06/04/2024 05/24/24     promethazine -dextromethorphan (PROMETHAZINE -DM) 6.25-15 MG/5ML syrup Take 5 mLs by mouth 4 (four) times daily as needed for cough. Patient not taking: Reported on 06/04/2024 03/20/24   Desiderio Chew, PA-C    Scheduled Meds:  acetaminophen   1,000 mg Oral TID   amLODipine   10 mg Oral Daily   azaTHIOprine   100 mg Oral Daily   benzonatate   100 mg Oral TID   carvedilol   6.25 mg Oral BID WC   epoetin  alfa-epbx (RETACRIT ) injection  20,000 Units Subcutaneous Once   ferrous sulfate   325 mg Oral Q breakfast   fluticasone  furoate-vilanterol  1 puff Inhalation Daily   folic acid   1 mg Intravenous Once   isosorbide -hydrALAZINE   2 tablet Oral TID   levothyroxine   112 mcg Oral Q0600   pantoprazole   40 mg Oral BID   rosuvastatin   10 mg Oral Daily   sodium bicarbonate  650 mg Oral BID   sodium chloride  flush  3 mL Intravenous Q12H   Continuous Infusions:  iron  sucrose     PRN Meds:.acetaminophen  **OR** acetaminophen , albuterol , guaiFENesin-dextromethorphan, labetalol, ondansetron  **OR** ondansetron  (ZOFRAN ) IV, senna-docusate  Allergies as of 06/03/2024 - Review Complete 06/03/2024  Allergen Reaction Noted   Shellfish allergy Anaphylaxis and Swelling 02/19/2012   Avocado Swelling 01/17/2021   Codeine  Other (See Comments) 04/02/2018   Iodinated contrast media Other (See Comments) 05/05/2022   Pineapple Swelling 01/17/2021   Penicillins Rash 01/17/2021    Family History  Problem Relation Age of Onset   Heart failure Mother    Kidney failure Mother    Stroke Sister    Aneurysm Sister    Heart Problems Brother    Hypertension Son    Hypertension Daughter     Social History   Socioeconomic History   Marital status: Married    Spouse name: Not on file   Number of children: 4   Years of education: Not on file   Highest education level: Not on  file  Occupational History   Not on file  Tobacco Use   Smoking status: Never    Passive exposure: Never   Smokeless tobacco: Never  Vaping Use   Vaping status: Never Used  Substance and Sexual Activity   Alcohol use: No   Drug use: No   Sexual activity: Yes    Birth control/protection: Surgical    Comment: partial hysterectomy  Other Topics Concern   Not on file  Social History Narrative   Not on file   Social Drivers of Health   Tobacco Use: Low Risk (06/03/2024)   Patient History    Smoking Tobacco Use: Never    Smokeless Tobacco Use: Never    Passive Exposure: Never  Financial Resource Strain: Not on file  Food Insecurity: No Food Insecurity (06/03/2024)   Epic  Worried About Programme Researcher, Broadcasting/film/video in the Last Year: Never true    Ran Out of Food in the Last Year: Never true  Transportation Needs: No Transportation Needs (06/03/2024)   Epic    Lack of Transportation (Medical): No    Lack of Transportation (Non-Medical): No  Physical Activity: Not on file  Stress: Not on file  Social Connections: Socially Integrated (06/04/2024)   Social Connection and Isolation Panel    Frequency of Communication with Friends and Family: More than three times a week    Frequency of Social Gatherings with Friends and Family: More than three times a week    Attends Religious Services: More than 4 times per year    Active Member of [employer] or Organizations: Yes    Attends Banker Meetings: More than 4 times per year    Marital Status: Married  Catering Manager Violence: Not At Risk (06/03/2024)   Epic    Fear of Current or Ex-Partner: No    Emotionally Abused: No    Physically Abused: No    Sexually Abused: No  Depression (PHQ2-9): Not on file  Alcohol Screen: Not on file  Housing: Low Risk (06/03/2024)   Epic    Unable to Pay for Housing in the Last Year: No    Number of Times Moved in the Last Year: 0    Homeless in the Last Year: No  Utilities: Not At Risk (06/03/2024)    Epic    Threatened with loss of utilities: No  Health Literacy: Not on file    Review of Systems: All negative except as stated above in HPI.  Physical Exam: Vital signs: Vitals:   06/09/24 2200 06/10/24 0556  BP:  128/70  Pulse:  (!) 102  Resp: 20 18  Temp:  97.9 F (36.6 C)  SpO2:  99%   Last BM Date : 06/09/24 General:   Alert,  Well-developed, well-nourished, pleasant and cooperative in NAD Normocephalic, atraumatic Extraocular movement intact Lungs: No visible respiratory distress Heart:  Regular rate and rhythm; no murmurs, clicks, rubs,  or gallops. Abdomen: Soft, nontender, nondistended, bowel sound present, no peritoneal signs Mood and affect normal Alert and oriented x 3 Rectal:  Deferred  GI:  Lab Results: Recent Labs    06/08/24 0533 06/09/24 0554 06/10/24 0531  WBC 5.1 4.5 5.4  HGB 6.8* 6.8* 6.6*  HCT 19.4* 19.7* 18.0*  PLT 201 239 208   BMET Recent Labs    06/08/24 0533 06/09/24 0554 06/10/24 0531  NA 134* 131* 129*  K 3.4* 3.9 3.7  CL 103 99 97*  CO2 22 20* 22  GLUCOSE 88 86 103*  BUN 27* 23 26*  CREATININE 1.18* 1.34* 1.43*  CALCIUM  8.5* 8.7* 8.6*   LFT Recent Labs    06/10/24 0531  PROT 6.0*  ALBUMIN  2.9*  AST 53*  ALT 32  ALKPHOS 182*  BILITOT 0.3   PT/INR No results for input(s): LABPROT, INR in the last 72 hours.   Studies/Results: No results found.  Impression/Plan: -Severe anemia with hemoglobin down to 6.6.  Hemoglobin was normal 3 months ago.  Gradual drop in hemoglobin since September 2025.  No overt bleeding.  FOBT negative.  No GI symptoms. - History of CVA.  Was on Plavix  prior to arrival. - Newly diagnosed DVT.  Was on heparin  drip and was subsequently switched to Eliquis .  Last dose yesterday morning. - Mild elevated LFTs.  Seen September 2025.  CT abdomen pelvis without contrast in  September 2025 showed gallstone otherwise no acute changes. - Chronic kidney disease - Jehovah's  Witness  Recommendations ------------------------ - Detailed discussion with patient and patient's family member today.  Difficult situation as patient is Tefl Teacher Witness and does not want to get a blood transfusion but at the same time she has history of DVT in need of anticoagulation and has severe anemia. - Patient with unexplained severe anemia but her hemoglobin is less than 7 and not able to undergo sedation for endoscopic evaluation at this time. - Continue management per oncology to improve her hemoglobin with IV iron  and EPO injection. - Patient and family wants to have another CT scan done for further evaluation which has been ordered by primary team. - Recommend to continue to hold Eliquis  for now - GI will follow.  Difficult decision making.    LOS: 7 days   Layla Lah  MD, FACP 06/10/2024, 8:37 AM  Contact #  [phone]

## 2024-06-10 NOTE — Progress Notes (Signed)
 PROGRESS NOTE    Evelyn Baker  FMW:[id number] DOB: 05-04-1961 DOA: 06/03/2024 PCP: Katina Pfeiffer, PA-C   Brief Narrative:  Evelyn Baker is a 63 y.o. 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 Hgb Trended down but no active Sx of bleeding. Started IV Iron  and sq EPO. Hematology consulted for further evaluation and recommendations. GI Consulted given drop in Hgb and deferring endoscopic evaluation until Hgb is >7.   Assessment and Plan:  Acute Left, Femoral Vein DVT: Patient denies any prior history of DVT, no recent surgeries.  Patient stated had a 4-hour car ride recently. -Patient noted the morning of 06/04/2024 with complaints of shortness of breath per RN.   -Patient denies any further significant shortness of breath.  -VQ scan done and showed that the pulmonary perfusion is within normal limits. There are no wedge-shaped perfusion defects to suggest acute pulmonary embolism.. -Patient was on IV heparin  however heparin  levels could not be obtained this morning as pediatric tubes were not available.  As patient states Jehovah's Witness was concerned about her hemoglobin levels. Discontinued IV heparin  and placed on full dose Lovenox  and subsequently transitioned to Apixaban  on 06/07/24. -Keep left lower extremity elevated.   -Continue scheduled Acetaminophen  1000 mg po TID for pain control.   -Hematology consulted and she will need to see them in outpatient setting for Hypercoaguable workup   AKI on CKD Stage 3a / Metabolic Acidosis: Patient noted to have a creatinine of 1.80 on admission compared to previous baseline of approximately 1.0-1.1. Per chart review patient with a creatinine 1.07 on 04/15/2024. Etiology of AKI likely multifactorial secondary to prerenal azotemia due to decreased oral intake in the setting of ARB. Urinalysis with 100  protein, small leukocytes, nitrite negative, rare bacteria, 11-20 WBCs. Urine sodium at 59, urine creatinine at 83. BUN/Cr Trend: Recent Labs  Lab 06/04/24 0017 06/05/24 0424 06/06/24 0857 06/07/24 0536 06/08/24 0533 06/09/24 0554 06/10/24 0531  BUN 47* 37* 29* 31* 27* 23 26*  CREATININE 1.72* 1.41* 1.39* 1.29* 1.18* 1.34* 1.43*  -Had a slight metabolic acidosis with a CO2 of 20, anion gap of 11, chloride level of 99 but is now improved and showed a CO2 of 22, chloride level of 10, anion gap of 97 -Continue to hold ARB and likely will not resume on discharge. Saline lock IV fluids and give a Dose of IV Lasix 20 mg x1 -Avoid Nephrotoxic Medications and stop Chlorthalidone , Contrast Dyes, Hypotension and Dehydration to Ensure Adequate Renal Perfusion and will need to Renally Adjust Meds. CTM and Trend Renal Function carefully and repeat CMP in the AM  -Will need outpt F/U w/ Nephrology   Acute Microcytic Anemia:  Hemoglobin noted to 8.3 on admission with prior hemoglobin of 10.0 03/20/2024 and prior to that 8.5 on 03/15/2024 but back in April of this year was 12.1. Hgb/Hct continues to Trend down but Patient with no overt bleeding as current trend shows:  Recent Labs  Lab 06/04/24 0017 06/05/24 0424 06/06/24 0857 06/07/24 0536 06/08/24 0533 06/09/24 0554 06/10/24 0531  HGB 7.8* 7.2* 8.0* 7.2* 6.8* 6.8* 6.6*  HCT 21.5* 19.7* 21.4* 20.0* 19.4* 19.7* 18.0*  MCV 75.2* 72.7* 72.5* 73.0* 75.2* 76.1* 73.2*  -FOBT negative. Anemia panel with iron  level of 23, TIBC of 165, ferritin of 473, folate of 7.5. Hemoglobin currently at 6.8 today, partly dilutional. Patient is a [employer]  Witness and as such does not accept blood products. Patient agreeable to IV iron  so gave her a dose of Iron  Sucrose 200 mg x1 today and Epoetin  Alfa-epbx 5,000 units on 12/10. On 12/11 gave her a dose of Iron  Sucrose 100 mg x1, IV Folic Acid  1 mg, and Epoetin  Alfa-epbx 20,000 units x1 and this was repeated 12/12.   -Hematology consulted for further evaluation and in agreement with current management. Patient to follow up with Hematology as an outpatient eventually  -GI also consulted and they had a detailed discussion with the patient's family and patient herself and feels that she cannot undergo sedation for endoscopic evaluation at this time given that her hemoglobin is less than 7.  Repeat CT scan of the abdomen pelvis without contrast has been done and it showed No acute abdominopelvic abnormality. No evidence of retroperitoneal hematoma but did show Findings of volume overload with trace right and small left pleural effusions, diffuse body wall edema, trace presacral edema, and pericardial effusion. Aortic Atherosclerosis   Hypokalemia: K+ is now 3.7. CTM and Replete as Necessary.   HypoNatremia: Likely hypervolemic hyponatremia; Na+ Trend:  Recent Labs  Lab 06/04/24 0017 06/05/24 0424 06/06/24 0857 06/07/24 0536 06/08/24 0533 06/09/24 0554 06/10/24 0531  NA 133* 133* 130* 132* 134* 131* 129*  -Stop Chlorthalidone  and Give her a dose of low dose Lasix 20 mg x1. CTM and Trend and Repeat CMP in the AM    Essential Hypertension: ARB on hold secondary to AKI. Continue BiDil  20-37.5 mg per tab 2 tabs 3 times daily. C/w Amlodipine  10 mg po Daily and Chlorthalidone  25 mg po Daily. CTM BP per Protocol. Last BP reading was elevated to 148/86. Will also add Carvedilol  6.25 mg po BID. Add IV Labetalol 10 mg q2hprn for SBP >180 or DBP >100. Outpatient follow-up with Cardiology.   History of CVA: Plavix  on hold as patient currently on full dose anticoagulation. Continue Rosuvastatin  10 mg po Daily.   Nausea and vomiting: Mild.  Continue with antiemetics w/ Ondansetron  4 mg po/IV q6hprn   Hypothyroidism: C/w Levothyroxine  112 mcg po Daily     Hyperlipidemia: C/w Rosuvastatin  10 mg po Daily    Mixed connective tissue disease/osteoarthritis -Patient being followed by rheumatology, Dr. Dolphus and  managed on Azathioprine  100g mg po daily in the outpatient setting. Patient states Imuran  held recently while on antibiotics but Resumed Imuran . Will need Outpatient follow-up with Rheumatology.   Asthma: Stable C/w Symbicort  substitution with Fluticasone  Furoate-Vilanterol 1 puff IH Daily and Albuterol  as needed.   Hypoalbuminemia: Patient's Albumin  Lvl is now 2.9. CTM & Trend & repeat CMP int he AM  Class II Obesity: Complicates overall prognosis and care. Estimated body mass index is 35.03 kg/m as calculated from the following:   Height as of this encounter: 5' 5 (1.651 m).   Weight as of this encounter: 95.5 kg. Weight Loss and Dietary Counseling given   DVT prophylaxis: SCDs; Holding Apixaban  for now    Code Status: Full Code Family Communication: D/w Husband @ bedside  Disposition Plan:  Level of care: Telemetry Status is: Inpatient Remains inpatient appropriate because: Needs further clinical improvement at bedside   Consultants:  Gastroenterology Hematology  Procedures:  As delineated as above  Antimicrobials:  Anti-infectives (From admission, onward)    None       Subjective: Seen and examined at bedside and was doing okay.  Nausea vomiting is improved.  Denied any lightheadedness or dizziness.  No chest pain or shortness of  breath.  Denies any other concerns or complaints at this time.  Objective: Vitals:   06/09/24 2100 06/09/24 2200 06/10/24 0556 06/10/24 1648  BP:   128/70 (!) 148/86  Pulse:   (!) 102 (!) 104  Resp: 20 20 18 20   Temp:   97.9 F (36.6 C) 99.4 F (37.4 C)  TempSrc:    Oral  SpO2:   99% 98%  Weight:      Height:        Intake/Output Summary (Last 24 hours) at 06/10/2024 1800 Last data filed at 06/09/2024 2100 Gross per 24 hour  Intake 840 ml  Output 325 ml  Net 515 ml   Filed Weights   06/07/24 0500 06/08/24 0500 06/09/24 0525  Weight: 101.2 kg 100.1 kg 95.5 kg   Examination: Physical Exam:  Constitutional: WN/WD obese  African-American female in no acute distress Respiratory: Diminished to auscultation bilaterally, no wheezing, rales, rhonchi or crackles. Normal respiratory effort and patient is not tachypenic. No accessory muscle use.  Unlabored breathing Cardiovascular: Mildly tachycardic, no murmurs / rubs / gallops. S1 and S2 auscultated.  There are some worsening edema on the left compared to right Abdomen: Soft, non-tender, distended secondary body habitus. Bowel sounds positive.  GU: Deferred. Musculoskeletal: No clubbing / cyanosis of digits/nails. No joint deformity upper and lower extremities.  Skin: No rashes, lesions, ulcers on limited skin evaluation. No induration; Warm and dry.  Neurologic: CN 2-12 grossly intact with no focal deficits. Romberg sign and cerebellar reflexes not assessed.  Psychiatric: Normal judgment and insight. Alert and oriented x 3. Normal mood and appropriate affect.   Data Reviewed: I have personally reviewed following labs and imaging studies  CBC: Recent Labs  Lab 06/05/24 0424 06/06/24 0857 06/07/24 0536 06/08/24 0533 06/09/24 0554 06/10/24 0531  WBC 7.4 7.9 6.7 5.1 4.5 5.4  NEUTROABS 5.8  --   --   --  3.2 4.0  HGB 7.2* 8.0* 7.2* 6.8* 6.8* 6.6*  HCT 19.7* 21.4* 20.0* 19.4* 19.7* 18.0*  MCV 72.7* 72.5* 73.0* 75.2* 76.1* 73.2*  PLT 163 195 223 201 239 208   Basic Metabolic Panel: Recent Labs  Lab 06/06/24 0857 06/07/24 0536 06/08/24 0533 06/09/24 0554 06/10/24 0531  NA 130* 132* 134* 131* 129*  K 3.2* 3.6 3.4* 3.9 3.7  CL 99 102 103 99 97*  CO2 21* 21* 22 20* 22  GLUCOSE 164* 88 88 86 103*  BUN 29* 31* 27* 23 26*  CREATININE 1.39* 1.29* 1.18* 1.34* 1.43*  CALCIUM  8.5* 8.7* 8.5* 8.7* 8.6*  MG  --   --   --  2.0 2.0  PHOS  --   --   --  3.3 3.3   GFR: Estimated Creatinine Clearance: 47.2 mL/min (A) (by C-G formula based on SCr of 1.43 mg/dL (H)). Liver Function Tests: Recent Labs  Lab 06/09/24 0554 06/10/24 0531  AST 36 53*  ALT 17 32   ALKPHOS 128* 182*  BILITOT 0.4 0.3  PROT 6.2* 6.0*  ALBUMIN  2.8* 2.9*   No results for input(s): LIPASE, AMYLASE in the last 168 hours. No results for input(s): AMMONIA in the last 168 hours. Coagulation Profile: No results for input(s): INR, PROTIME in the last 168 hours. Cardiac Enzymes: No results for input(s): CKTOTAL, CKMB, CKMBINDEX, TROPONINI in the last 168 hours. BNP (last 3 results) No results for input(s): PROBNP in the last 8760 hours. HbA1C: No results for input(s): HGBA1C in the last 72 hours. CBG: No results for input(s): GLUCAP in  the last 168 hours. Lipid Profile: No results for input(s): CHOL, HDL, LDLCALC, TRIG, CHOLHDL, LDLDIRECT in the last 72 hours. Thyroid  Function Tests: No results for input(s): TSH, T4TOTAL, FREET4, T3FREE, THYROIDAB in the last 72 hours. Anemia Panel: No results for input(s): VITAMINB12, FOLATE, FERRITIN, TIBC, IRON , RETICCTPCT in the last 72 hours. Sepsis Labs: No results for input(s): PROCALCITON, LATICACIDVEN in the last 168 hours.  Recent Results (from the past 240 hours)  Urine Culture (for pregnant, neutropenic or urologic patients or patients with an indwelling urinary catheter)     Status: None   Collection Time: 06/04/24  1:45 PM   Specimen: Urine, Clean Catch  Result Value Ref Range Status   Specimen Description   Final    URINE, CLEAN CATCH Performed at [facility], [address], [place], [place] [address]    Special Requests   Final    NONE Performed at [facility], [address], [place], [place] [address]    Culture   Final    NO GROWTH Performed at [facility], [address], [place], [place] [address]    Report Status 06/05/2024 FINAL  Final    Radiology Studies: CT ABDOMEN PELVIS WO CONTRAST Result Date: 06/10/2024 CLINICAL DATA:  Anemia.  Concern for retroperitoneal hematoma. EXAM: CT ABDOMEN AND  PELVIS WITHOUT CONTRAST TECHNIQUE: Multidetector CT imaging of the abdomen and pelvis was performed following the standard protocol without IV contrast. RADIATION DOSE REDUCTION: This exam was performed according to the departmental dose-optimization program which includes automated exposure control, adjustment of the mA and/or kV according to patient size and/or use of iterative reconstruction technique. COMPARISON:  CT abdomen and pelvis dated 03/20/2024 FINDINGS: Lower chest: Bilateral lower lobe relaxation atelectasis. Trace right and small left pleural effusions. Partially imaged heart size is normal. Trace pericardial effusion. Hepatobiliary: No focal hepatic lesions. No intra or extrahepatic biliary ductal dilation. Normal gallbladder. Pancreas: No focal lesions or main ductal dilation. Spleen: Normal in size without focal abnormality. Adrenals/Urinary Tract: No adrenal nodules. No suspicious renal mass on this noncontrast enhanced examination, calculi or hydronephrosis. No focal bladder wall thickening. Stomach/Bowel: Small hiatal hernia. Normal appearance of the stomach. No evidence of bowel wall thickening, distention, or inflammatory changes. Small and large bowel are diffusely underdistended. Normal appendix. Vascular/Lymphatic: Aortic atherosclerosis. Superficial varicose veins anterior to the pubis. No enlarged abdominal or pelvic lymph nodes. Reproductive: No adnexal masses. Other: Trace presacral free fluid.  No free air or fluid collection. Musculoskeletal: No acute or abnormal lytic or blastic osseous lesions. Diffuse body wall edema. IMPRESSION: 1. No acute abdominopelvic abnormality. No evidence of retroperitoneal hematoma. 2. Findings of volume overload with trace right and small left pleural effusions, diffuse body wall edema, trace presacral edema, and pericardial effusion. 3.  Aortic Atherosclerosis (ICD10-I70.0). Electronically Signed   By: Limin  Xu M.D.   On: 06/10/2024 16:05   Scheduled  Meds:  acetaminophen   1,000 mg Oral TID   amLODipine   10 mg Oral Daily   azaTHIOprine   100 mg Oral Daily   benzonatate   100 mg Oral TID   carvedilol   6.25 mg Oral BID WC   ferrous sulfate   325 mg Oral TID WC   fluticasone  furoate-vilanterol  1 puff Inhalation Daily   furosemide  20 mg Intravenous Once   isosorbide -hydrALAZINE   2 tablet Oral TID   levothyroxine   112 mcg Oral Q0600   pantoprazole   40 mg Oral BID   rosuvastatin   10 mg Oral Daily  sodium bicarbonate  650 mg Oral BID   sodium chloride  flush  3 mL Intravenous Q12H   Continuous Infusions:   LOS: 7 days   Alejandro Marker, DO [facility] Available via Epic secure chat 7am-7pm After these hours, please refer to coverage provider listed on [email or web address] 06/10/2024, 6:00 PM

## 2024-06-11 LAB — COMPREHENSIVE METABOLIC PANEL WITH GFR
ALT: 28 U/L (ref 0–44)
AST: 41 U/L (ref 15–41)
Albumin: 3 g/dL — ABNORMAL LOW (ref 3.5–5.0)
Alkaline Phosphatase: 168 U/L — ABNORMAL HIGH (ref 38–126)
Anion gap: 11 (ref 5–15)
BUN: 24 mg/dL — ABNORMAL HIGH (ref 8–23)
CO2: 21 mmol/L — ABNORMAL LOW (ref 22–32)
Calcium: 8.6 mg/dL — ABNORMAL LOW (ref 8.9–10.3)
Chloride: 96 mmol/L — ABNORMAL LOW (ref 98–111)
Creatinine, Ser: 1.48 mg/dL — ABNORMAL HIGH (ref 0.44–1.00)
GFR, Estimated: 40 mL/min — ABNORMAL LOW (ref >60)
Glucose, Bld: 122 mg/dL — ABNORMAL HIGH (ref 70–99)
Potassium: 3.4 mmol/L — ABNORMAL LOW (ref 3.5–5.1)
Sodium: 129 mmol/L — ABNORMAL LOW (ref 135–145)
Total Bilirubin: 0.4 mg/dL (ref 0.0–1.2)
Total Protein: 6.4 g/dL — ABNORMAL LOW (ref 6.5–8.1)

## 2024-06-11 LAB — CBC WITH DIFFERENTIAL/PLATELET
Abs Immature Granulocytes: 0.15 K/uL — ABNORMAL HIGH (ref 0.00–0.07)
Basophils Absolute: 0 K/uL (ref 0.0–0.1)
Basophils Relative: 0 %
Eosinophils Absolute: 0.2 K/uL (ref 0.0–0.5)
Eosinophils Relative: 4 %
HCT: 18.5 % — ABNORMAL LOW (ref 36.0–46.0)
Hemoglobin: 6.7 g/dL — CL (ref 12.0–15.0)
Immature Granulocytes: 3 %
Lymphocytes Relative: 13 %
Lymphs Abs: 0.7 K/uL (ref 0.7–4.0)
MCH: 26.7 pg (ref 26.0–34.0)
MCHC: 36.2 g/dL — ABNORMAL HIGH (ref 30.0–36.0)
MCV: 73.7 fL — ABNORMAL LOW (ref 80.0–100.0)
Monocytes Absolute: 0.4 K/uL (ref 0.1–1.0)
Monocytes Relative: 8 %
Neutro Abs: 3.8 K/uL (ref 1.7–7.7)
Neutrophils Relative %: 72 %
Platelets: 219 K/uL (ref 150–400)
RBC: 2.51 MIL/uL — ABNORMAL LOW (ref 3.87–5.11)
RDW: 17 % — ABNORMAL HIGH (ref 11.5–15.5)
WBC: 5.3 K/uL (ref 4.0–10.5)
nRBC: 0 % (ref 0.0–0.2)

## 2024-06-11 LAB — MAGNESIUM: Magnesium: 2 mg/dL (ref 1.7–2.4)

## 2024-06-11 LAB — PHOSPHORUS: Phosphorus: 3.6 mg/dL (ref 2.5–4.6)

## 2024-06-11 MED ORDER — BUDESONIDE-FORMOTEROL FUMARATE 80-4.5 MCG/ACT IN AERO
2.0000 | INHALATION_SPRAY | Freq: Two times a day (BID) | RESPIRATORY_TRACT | Status: DC
  Administered 2024-06-11 – 2024-06-12 (×3): 2 via RESPIRATORY_TRACT

## 2024-06-11 MED ORDER — EPOETIN ALFA-EPBX 20000 UNIT/ML IJ SOLN
20000.0000 [IU] | Freq: Once | INTRAMUSCULAR | Status: DC
  Filled 2024-06-11: qty 1

## 2024-06-11 MED ORDER — SODIUM CHLORIDE 0.9 % IV SOLN
100.0000 mg | Freq: Once | INTRAVENOUS | Status: AC
  Administered 2024-06-11: 100 mg via INTRAVENOUS
  Filled 2024-06-11: qty 5

## 2024-06-11 MED ORDER — FUROSEMIDE 10 MG/ML IJ SOLN
20.0000 mg | Freq: Once | INTRAMUSCULAR | Status: AC
  Administered 2024-06-11: 20 mg via INTRAVENOUS
  Filled 2024-06-11: qty 2

## 2024-06-11 MED ORDER — EPOETIN ALFA-EPBX 10000 UNIT/ML IJ SOLN
20000.0000 [IU] | Freq: Once | INTRAMUSCULAR | Status: AC
  Administered 2024-06-11: 20000 [IU] via SUBCUTANEOUS
  Filled 2024-06-11: qty 2

## 2024-06-11 MED ORDER — POTASSIUM CHLORIDE CRYS ER 20 MEQ PO TBCR
40.0000 meq | EXTENDED_RELEASE_TABLET | Freq: Two times a day (BID) | ORAL | Status: AC
  Administered 2024-06-11 (×2): 40 meq via ORAL
  Filled 2024-06-11 (×2): qty 2

## 2024-06-11 MED ORDER — FOLIC ACID 5 MG/ML IJ SOLN
1.0000 mg | Freq: Once | INTRAMUSCULAR | Status: AC
  Administered 2024-06-11: 1 mg via INTRAVENOUS
  Filled 2024-06-11: qty 0.2

## 2024-06-11 MED ADMIN — Dextromethorphan-Guaifenesin Syrup 10-100 MG/5ML: 5 mL | ORAL | NDC 00121063805

## 2024-06-11 NOTE — Progress Notes (Signed)
 PROGRESS NOTE    Evelyn Baker  FMW:[id number] DOB: 05/18/1961 DOA: 06/03/2024 PCP: Katina Pfeiffer, PA-C   Brief Narrative:  Evelyn Baker is a 63 y.o. 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 Hgb Trended down but no active Sx of bleeding. Started IV Iron  and sq EPO. Hematology consulted for further evaluation and recommendations. GI Consulted given drop in Hgb and deferring endoscopic evaluation until Hgb is >7.   Assessment and Plan:  Acute Left, Femoral Vein DVT: Patient denies any prior history of DVT, no recent surgeries.  Patient stated had a 4-hour car ride recently. -Patient noted the morning of 06/04/2024 with complaints of shortness of breath per RN.   -Patient denies any further significant shortness of breath.  -VQ scan done and showed that the pulmonary perfusion is within normal limits. There are no wedge-shaped perfusion defects to suggest acute pulmonary embolism.. -Patient was on IV heparin  however heparin  levels could not be obtained this morning as pediatric tubes were not available.  As patient states Jehovah's Witness was concerned about her hemoglobin levels. Discontinued IV heparin  and placed on full dose Lovenox  and subsequently transitioned to Apixaban  on 06/07/24 but will start holding 12/12 -Keep left lower extremity elevated.   -Continue scheduled Acetaminophen  1000 mg po TID for pain control.   -Hematology consulted and she will need to see them in outpatient setting for Hypercoaguable workup   AKI on CKD Stage 3a / Metabolic Acidosis: Patient noted to have a creatinine of 1.80 on admission compared to previous baseline of approximately 1.0-1.1. Per chart review patient with a creatinine 1.07 on 04/15/2024. Etiology of AKI likely multifactorial secondary to prerenal azotemia due to decreased oral intake in the setting of ARB.  Urinalysis with 100 protein, small leukocytes, nitrite negative, rare bacteria, 11-20 WBCs. Urine sodium at 59, urine creatinine at 83. BUN/Cr Trend: Recent Labs  Lab 06/05/24 0424 06/06/24 0857 06/07/24 0536 06/08/24 0533 06/09/24 0554 06/10/24 0531 06/11/24 0800  BUN 37* 29* 31* 27* 23 26* 24*  CREATININE 1.41* 1.39* 1.29* 1.18* 1.34* 1.43* 1.48*  -Had a slight metabolic acidosis with a CO2 of 21, anion gap of 11, chloride level of 96 -Continue to hold ARB and likely will not resume on discharge. Saline lock IV fluids and give a Dose of IV Lasix  20 mg x1 again -Avoid Nephrotoxic Medications and stop Chlorthalidone , Contrast Dyes, Hypotension and Dehydration to Ensure Adequate Renal Perfusion and will need to Renally Adjust Meds. CTM and Trend Renal Function carefully and repeat CMP in the AM  -Will need outpt F/U w/ Nephrology   Acute Microcytic Anemia:  Hemoglobin noted to 8.3 on admission with prior hemoglobin of 10.0 03/20/2024 and prior to that 8.5 on 03/15/2024 but back in April of this year was 12.1. Hgb/Hct continues to Trend down but Patient with no overt bleeding as current trend shows:  Recent Labs  Lab 06/05/24 0424 06/06/24 0857 06/07/24 0536 06/08/24 0533 06/09/24 0554 06/10/24 0531 06/11/24 0800  HGB 7.2* 8.0* 7.2* 6.8* 6.8* 6.6* 6.7*  HCT 19.7* 21.4* 20.0* 19.4* 19.7* 18.0* 18.5*  MCV 72.7* 72.5* 73.0* 75.2* 76.1* 73.2* 73.7*  -FOBT negative. Anemia panel with iron  level of 23, TIBC of 165, ferritin of 473, folate of 7.5. Hemoglobin currently at 6.8 today, partly dilutional. Patient is a Jehovah's Witness and as such does not accept blood products. Patient agreeable to  IV iron  so gave her a dose of Iron  Sucrose 200 mg x1 today and Epoetin  Alfa-epbx 5,000 units on 12/10. On 12/11 gave her a dose of Iron  Sucrose 100 mg x1, IV Folic Acid  1 mg, and Epoetin  Alfa-epbx 20,000 units x1 and this was repeated 12/12, 12/13.  -Hematology consulted for further evaluation and in  agreement with current management. Patient to follow up with Hematology as an outpatient eventually  -GI also consulted and they had a detailed discussion with the patient's family and patient herself and feels that she cannot undergo sedation for endoscopic evaluation at this time given that her hemoglobin is less than 7. GI has no new Recc's and have signed off the case. -Repeat CT scan of the abdomen pelvis without contrast has been done and it showed No acute abdominopelvic abnormality. No evidence of retroperitoneal hematoma but did show Findings of volume overload with trace right and small left pleural effusions, diffuse body wall edema, trace presacral edema, and pericardial effusion. Aortic Atherosclerosis -Given CT Findings will give IV Lasix  20 mg. Given yesterday and will give again today.    Hypokalemia: K+ is now 3.4. Replete w/ po KCL 40 mEQ BID x2. CTM and Replete as Necessary.   HypoNatremia: Likely hypervolemic hyponatremia; Na+ Trend:  Recent Labs  Lab 06/05/24 0424 06/06/24 0857 06/07/24 0536 06/08/24 0533 06/09/24 0554 06/10/24 0531 06/11/24 0800  NA 133* 130* 132* 134* 131* 129* 129*  -Stop Chlorthalidone  and Give her a dose of low dose Lasix  20 mg x1 again. CTM and Trend and Repeat CMP in the AM    Essential Hypertension: ARB on hold secondary to AKI. Continue BiDil  20-37.5 mg per tab 2 tabs 3 times daily. C/w Amlodipine  10 mg po Daily and Chlorthalidone  25 mg po Daily. CTM BP per Protocol. Last BP reading was 133/87. Will also add Carvedilol  6.25 mg po BID. Add IV Labetalol  10 mg q2hprn for SBP >180 or DBP >100. Outpatient follow-up with Cardiology.   History of CVA: Plavix  on hold as patient currently on full dose anticoagulation. Continue Rosuvastatin  10 mg po Daily.   Nausea and vomiting: Mild.  Continue with antiemetics w/ Ondansetron  4 mg po/IV q6hprn   Hypothyroidism: C/w Levothyroxine  112 mcg po Daily     Hyperlipidemia: C/w Rosuvastatin  10 mg po Daily     Mixed connective tissue disease/osteoarthritis -Patient being followed by rheumatology, Dr. Dolphus and managed on Azathioprine  100g mg po daily in the outpatient setting. Patient states Imuran  held recently while on antibiotics but Resumed Imuran . Will need Outpatient follow-up with Rheumatology.   Asthma: Stable C/w Symbicort  substitution with Fluticasone  Furoate-Vilanterol 1 puff IH Daily and Albuterol  as needed.   Hypoalbuminemia: Patient's Albumin  Lvl is now 3.0. CTM & Trend & repeat CMP int he AM  Class II Obesity: Complicates overall prognosis and care. Estimated body mass index is 35.23 kg/m as calculated from the following:   Height as of this encounter: 5' 5 (1.651 m).   Weight as of this encounter: 96 kg. Weight Loss and Dietary Counseling given   DVT prophylaxis: None as holding Apixaban  for now    Code Status: Full Code Family Communication: D/w Husband @ bedside  Disposition Plan:  Level of care: Telemetry Status is: Inpatient Remains inpatient appropriate because: Needs further clinical improvement and her hemoglobin   Consultants:  Gastroenterology Hematology  Procedures:  As delineated as above  Antimicrobials:  Anti-infectives (From admission, onward)    None       Subjective: Seen and  examined at bedside and feeling good today.  No nausea or vomiting.  No lightheadedness or dizziness.  Denies chest pain or shortness breath.  No other concerns or complaints at this time but hemoglobin still remains on the low side and understands that she will be getting another dose of Venofer , EPO and folic acid .  Discussed with her about some albumin  and some Lasix  and agreeable to the Lasix  but will think about the albumin .  Objective: Vitals:   06/11/24 0446 06/11/24 0500 06/11/24 0913 06/11/24 1316  BP: (!) 140/81  132/75 133/87  Pulse: 91  94 88  Resp: 18  16 15   Temp: 98.9 F (37.2 C)  98.4 F (36.9 C) 98.4 F (36.9 C)  TempSrc: Oral     SpO2: 100%   100% 99%  Weight:  96 kg    Height:        Intake/Output Summary (Last 24 hours) at 06/11/2024 1719 Last data filed at 06/11/2024 1006 Gross per 24 hour  Intake 100 ml  Output --  Net 100 ml   Filed Weights   06/08/24 0500 06/09/24 0525 06/11/24 0500  Weight: 100.1 kg 95.5 kg 96 kg   Examination: Physical Exam:  Constitutional: WN/WD obese African-American female in no acute distress Respiratory: Diminished to auscultation bilaterally, no wheezing, rales, rhonchi or crackles. Normal respiratory effort and patient is not tachypenic. No accessory muscle use.  Unlabored breathing Cardiovascular: RRR, no murmurs / rubs / gallops. S1 and S2 auscultated.  1+ extremity edema Abdomen: Soft, non-tender, distended secondary to body habitus. Bowel sounds positive.  GU: Deferred. Musculoskeletal: No clubbing / cyanosis of digits/nails. No joint deformity upper and lower extremities.  Skin: No rashes, lesions, ulcers on limited skin evaluation. No induration; Warm and dry.  Neurologic: CN 2-12 grossly intact with no focal deficits. Romberg sign and cerebellar reflexes not assessed.  Psychiatric: Normal judgment and insight. Alert and oriented x 3. Normal mood and appropriate affect.   Data Reviewed: I have personally reviewed following labs and imaging studies  CBC: Recent Labs  Lab 06/05/24 0424 06/06/24 0857 06/07/24 0536 06/08/24 0533 06/09/24 0554 06/10/24 0531 06/11/24 0800  WBC 7.4   < > 6.7 5.1 4.5 5.4 5.3  NEUTROABS 5.8  --   --   --  3.2 4.0 3.8  HGB 7.2*   < > 7.2* 6.8* 6.8* 6.6* 6.7*  HCT 19.7*   < > 20.0* 19.4* 19.7* 18.0* 18.5*  MCV 72.7*   < > 73.0* 75.2* 76.1* 73.2* 73.7*  PLT 163   < > 223 201 239 208 219   < > = values in this interval not displayed.   Basic Metabolic Panel: Recent Labs  Lab 06/07/24 0536 06/08/24 0533 06/09/24 0554 06/10/24 0531 06/11/24 0800  NA 132* 134* 131* 129* 129*  K 3.6 3.4* 3.9 3.7 3.4*  CL 102 103 99 97* 96*  CO2 21* 22 20*  22 21*  GLUCOSE 88 88 86 103* 122*  BUN 31* 27* 23 26* 24*  CREATININE 1.29* 1.18* 1.34* 1.43* 1.48*  CALCIUM  8.7* 8.5* 8.7* 8.6* 8.6*  MG  --   --  2.0 2.0 2.0  PHOS  --   --  3.3 3.3 3.6   GFR: Estimated Creatinine Clearance: 45.7 mL/min (A) (by C-G formula based on SCr of 1.48 mg/dL (H)). Liver Function Tests: Recent Labs  Lab 06/09/24 0554 06/10/24 0531 06/11/24 0800  AST 36 53* 41  ALT 17 32 28  ALKPHOS 128* 182* 168*  BILITOT 0.4 0.3 0.4  PROT 6.2* 6.0* 6.4*  ALBUMIN  2.8* 2.9* 3.0*   No results for input(s): LIPASE, AMYLASE in the last 168 hours. No results for input(s): AMMONIA in the last 168 hours. Coagulation Profile: No results for input(s): INR, PROTIME in the last 168 hours. Cardiac Enzymes: No results for input(s): CKTOTAL, CKMB, CKMBINDEX, TROPONINI in the last 168 hours. BNP (last 3 results) No results for input(s): PROBNP in the last 8760 hours. HbA1C: No results for input(s): HGBA1C in the last 72 hours. CBG: No results for input(s): GLUCAP in the last 168 hours. Lipid Profile: No results for input(s): CHOL, HDL, LDLCALC, TRIG, CHOLHDL, LDLDIRECT in the last 72 hours. Thyroid  Function Tests: No results for input(s): TSH, T4TOTAL, FREET4, T3FREE, THYROIDAB in the last 72 hours. Anemia Panel: No results for input(s): VITAMINB12, FOLATE, FERRITIN, TIBC, IRON , RETICCTPCT in the last 72 hours. Sepsis Labs: No results for input(s): PROCALCITON, LATICACIDVEN in the last 168 hours.  Recent Results (from the past 240 hours)  Urine Culture (for pregnant, neutropenic or urologic patients or patients with an indwelling urinary catheter)     Status: None   Collection Time: 06/04/24  1:45 PM   Specimen: Urine, Clean Catch  Result Value Ref Range Status   Specimen Description   Final    URINE, CLEAN CATCH Performed at [facility], [address] [place], [place] [address]     Special Requests   Final    NONE Performed at [facility], [address], [place], [place] [address]    Culture   Final    NO GROWTH Performed at [facility], [address] [place], [place] [address]    Report Status 06/05/2024 FINAL  Final    Radiology Studies: CT ABDOMEN PELVIS WO CONTRAST Result Date: 06/10/2024 CLINICAL DATA:  Anemia.  Concern for retroperitoneal hematoma. EXAM: CT ABDOMEN AND PELVIS WITHOUT CONTRAST TECHNIQUE: Multidetector CT imaging of the abdomen and pelvis was performed following the standard protocol without IV contrast. RADIATION DOSE REDUCTION: This exam was performed according to the departmental dose-optimization program which includes automated exposure control, adjustment of the mA and/or kV according to patient size and/or use of iterative reconstruction technique. COMPARISON:  CT abdomen and pelvis dated 03/20/2024 FINDINGS: Lower chest: Bilateral lower lobe relaxation atelectasis. Trace right and small left pleural effusions. Partially imaged heart size is normal. Trace pericardial effusion. Hepatobiliary: No focal hepatic lesions. No intra or extrahepatic biliary ductal dilation. Normal gallbladder. Pancreas: No focal lesions or main ductal dilation. Spleen: Normal in size without focal abnormality. Adrenals/Urinary Tract: No adrenal nodules. No suspicious renal mass on this noncontrast enhanced examination, calculi or hydronephrosis. No focal bladder wall thickening. Stomach/Bowel: Small hiatal hernia. Normal appearance of the stomach. No evidence of bowel wall thickening, distention, or inflammatory changes. Small and large bowel are diffusely underdistended. Normal appendix. Vascular/Lymphatic: Aortic atherosclerosis. Superficial varicose veins anterior to the pubis. No enlarged abdominal or pelvic lymph nodes. Reproductive: No adnexal masses. Other: Trace presacral free fluid.  No free air or fluid collection. Musculoskeletal: No  acute or abnormal lytic or blastic osseous lesions. Diffuse body wall edema. IMPRESSION: 1. No acute abdominopelvic abnormality. No evidence of retroperitoneal hematoma. 2. Findings of volume overload with trace right and small left pleural effusions, diffuse body wall edema, trace presacral edema, and pericardial effusion. 3.  Aortic Atherosclerosis (ICD10-I70.0). Electronically Signed   By: Limin  Xu M.D.   On: 06/10/2024 16:05   Scheduled Meds:  acetaminophen   1,000 mg Oral TID   amLODipine   10 mg Oral Daily   azaTHIOprine   100 mg Oral Daily   benzonatate   100 mg Oral TID   budesonide -formoterol   2 puff Inhalation BID   carvedilol   6.25 mg Oral BID WC   ferrous sulfate   325 mg Oral TID WC   furosemide   20 mg Intravenous Once   isosorbide -hydrALAZINE   2 tablet Oral TID   levothyroxine   112 mcg Oral Q0600   pantoprazole   40 mg Oral BID   potassium chloride   40 mEq Oral BID   rosuvastatin   10 mg Oral Daily   sodium bicarbonate   650 mg Oral BID   sodium chloride  flush  3 mL Intravenous Q12H   Continuous Infusions:   LOS: 8 days   Alejandro Marker, DO [facility] Available via Epic secure chat 7am-7pm After these hours, please refer to coverage provider listed on [email or web address] 06/11/2024, 5:19 PM

## 2024-06-11 NOTE — Progress Notes (Signed)
 [facility] Gastroenterology Progress Note  Evelyn Baker 63 y.o. 1960/11/17   Subjective: Denies abdominal pain.  Status post iron  infusion today.  Nursing staff present in room.  Family in room.  Objective: Vital signs: Vitals:   06/11/24 0913 06/11/24 1316  BP: 132/75 133/87  Pulse: 94 88  Resp: 16 15  Temp: 98.4 F (36.9 C) 98.4 F (36.9 C)  SpO2: 100% 99%    Physical Exam: Gen: alert, no acute distress, well-nourished, pleasant HEENT: anicteric sclera CV: RRR Chest: CTA B Abd: Soft nontender nondistended positive bowel sounds Ext: no edema  Lab Results: Recent Labs    06/10/24 0531 06/11/24 0800  NA 129* 129*  K 3.7 3.4*  CL 97* 96*  CO2 22 21*  GLUCOSE 103* 122*  BUN 26* 24*  CREATININE 1.43* 1.48*  CALCIUM  8.6* 8.6*  MG 2.0 2.0  PHOS 3.3 3.6   Recent Labs    06/10/24 0531 06/11/24 0800  AST 53* 41  ALT 32 28  ALKPHOS 182* 168*  BILITOT 0.3 0.4  PROT 6.0* 6.4*  ALBUMIN  2.9* 3.0*   Recent Labs    06/10/24 0531 06/11/24 0800  WBC 5.4 5.3  NEUTROABS 4.0 3.8  HGB 6.6* 6.7*  HCT 18.0* 18.5*  MCV 73.2* 73.7*  PLT 208 219      Assessment/Plan: Severe anemia without overt bleeding.  Hemoccult negative.  Status post iron  infusion today.  Receiving EPO injections.  Unable to give blood transfusion due to religious purposes [facility] Witness).  Follow H&H's closely.  No new GI recs.  [facility] will sign off.  Call if questions.    Jerrell JAYSON Sol 06/11/2024, 2:04 PM  Questions please call [phone] ID: Evelyn Baker, female   DOB: 02-27-1961, 63 y.o.   MRN: [id number]

## 2024-06-11 NOTE — Plan of Care (Signed)

## 2024-06-12 DIAGNOSIS — I82412 Acute embolism and thrombosis of left femoral vein: Secondary | ICD-10-CM | POA: Diagnosis not present

## 2024-06-12 DIAGNOSIS — J45909 Unspecified asthma, uncomplicated: Secondary | ICD-10-CM | POA: Diagnosis not present

## 2024-06-12 DIAGNOSIS — N179 Acute kidney failure, unspecified: Secondary | ICD-10-CM | POA: Diagnosis not present

## 2024-06-12 DIAGNOSIS — D509 Iron deficiency anemia, unspecified: Secondary | ICD-10-CM | POA: Diagnosis not present

## 2024-06-12 LAB — CBC WITH DIFFERENTIAL/PLATELET
Abs Immature Granulocytes: 0.21 K/uL — ABNORMAL HIGH (ref 0.00–0.07)
Basophils Absolute: 0 K/uL (ref 0.0–0.1)
Basophils Relative: 0 %
Eosinophils Absolute: 0 K/uL (ref 0.0–0.5)
Eosinophils Relative: 0 %
HCT: 17.6 % — ABNORMAL LOW (ref 36.0–46.0)
Hemoglobin: 6.4 g/dL — CL (ref 12.0–15.0)
Immature Granulocytes: 4 %
Lymphocytes Relative: 10 %
Lymphs Abs: 0.5 K/uL — ABNORMAL LOW (ref 0.7–4.0)
MCH: 27.1 pg (ref 26.0–34.0)
MCHC: 36.4 g/dL — ABNORMAL HIGH (ref 30.0–36.0)
MCV: 74.6 fL — ABNORMAL LOW (ref 80.0–100.0)
Monocytes Absolute: 0.4 K/uL (ref 0.1–1.0)
Monocytes Relative: 9 %
Neutro Abs: 3.8 K/uL (ref 1.7–7.7)
Neutrophils Relative %: 77 %
Platelets: 225 K/uL (ref 150–400)
RBC: 2.36 MIL/uL — ABNORMAL LOW (ref 3.87–5.11)
RDW: 17.1 % — ABNORMAL HIGH (ref 11.5–15.5)
WBC: 5 K/uL (ref 4.0–10.5)
nRBC: 0.4 % — ABNORMAL HIGH (ref 0.0–0.2)

## 2024-06-12 LAB — COMPREHENSIVE METABOLIC PANEL WITH GFR
ALT: 34 U/L (ref 0–44)
AST: 40 U/L (ref 15–41)
Albumin: 2.9 g/dL — ABNORMAL LOW (ref 3.5–5.0)
Alkaline Phosphatase: 144 U/L — ABNORMAL HIGH (ref 38–126)
Anion gap: 9 (ref 5–15)
BUN: 25 mg/dL — ABNORMAL HIGH (ref 8–23)
CO2: 22 mmol/L (ref 22–32)
Calcium: 8.5 mg/dL — ABNORMAL LOW (ref 8.9–10.3)
Chloride: 99 mmol/L (ref 98–111)
Creatinine, Ser: 1.34 mg/dL — ABNORMAL HIGH (ref 0.44–1.00)
GFR, Estimated: 45 mL/min — ABNORMAL LOW (ref >60)
Glucose, Bld: 89 mg/dL (ref 70–99)
Potassium: 3.9 mmol/L (ref 3.5–5.1)
Sodium: 130 mmol/L — ABNORMAL LOW (ref 135–145)
Total Bilirubin: 0.3 mg/dL (ref 0.0–1.2)
Total Protein: 6.2 g/dL — ABNORMAL LOW (ref 6.5–8.1)

## 2024-06-12 LAB — MAGNESIUM: Magnesium: 2 mg/dL (ref 1.7–2.4)

## 2024-06-12 LAB — PHOSPHORUS: Phosphorus: 3.3 mg/dL (ref 2.5–4.6)

## 2024-06-12 MED ORDER — EPOETIN ALFA 20000 UNIT/ML IJ SOLN
40000.0000 [IU] | Freq: Once | INTRAMUSCULAR | Status: AC
  Administered 2024-06-12: 40000 [IU] via SUBCUTANEOUS
  Filled 2024-06-12: qty 2

## 2024-06-12 MED ORDER — EPOETIN ALFA-EPBX 10000 UNIT/ML IJ SOLN
20000.0000 [IU] | Freq: Once | INTRAMUSCULAR | Status: DC
  Filled 2024-06-12: qty 2

## 2024-06-12 MED ORDER — FOLIC ACID 5 MG/ML IJ SOLN
1.0000 mg | Freq: Once | INTRAMUSCULAR | Status: AC
  Administered 2024-06-12: 1 mg via INTRAVENOUS
  Filled 2024-06-12: qty 0.2

## 2024-06-12 MED ORDER — EPOETIN ALFA-EPBX 20000 UNIT/ML IJ SOLN
40000.0000 [IU] | Freq: Once | INTRAMUSCULAR | Status: DC
  Filled 2024-06-12: qty 2

## 2024-06-12 MED ORDER — FUROSEMIDE 10 MG/ML IJ SOLN
20.0000 mg | Freq: Once | INTRAMUSCULAR | Status: AC
  Administered 2024-06-12: 20 mg via INTRAVENOUS
  Filled 2024-06-12: qty 2

## 2024-06-12 MED ORDER — SODIUM CHLORIDE 0.9 % IV SOLN
100.0000 mg | Freq: Once | INTRAVENOUS | Status: AC
  Administered 2024-06-12: 100 mg via INTRAVENOUS
  Filled 2024-06-12: qty 5

## 2024-06-12 MED ORDER — VITAMIN B-12 1000 MCG PO TABS
1000.0000 ug | ORAL_TABLET | Freq: Every day | ORAL | Status: DC
  Administered 2024-06-12 – 2024-06-13 (×2): 1000 ug via ORAL
  Filled 2024-06-12 (×2): qty 1

## 2024-06-12 MED ADMIN — Dextromethorphan-Guaifenesin Syrup 10-100 MG/5ML: 5 mL | ORAL | NDC 00121063805

## 2024-06-12 NOTE — Plan of Care (Signed)

## 2024-06-12 NOTE — Plan of Care (Signed)
Problem: Education: Goal: Knowledge of General Education information will improve Description Including pain rating scale, medication(s)/side effects and non-pharmacologic comfort measures Outcome: Progressing   Problem: Health Behavior/Discharge Planning: Goal: Ability to manage health-related needs will improve Outcome: Progressing   Problem: Clinical Measurements: Goal: Respiratory complications will improve Outcome: Progressing   Problem: Activity: Goal: Risk for activity intolerance will decrease Outcome: Progressing   Problem: Nutrition: Goal: Adequate nutrition will be maintained Outcome: Progressing   Problem: Coping: Goal: Level of anxiety will decrease Outcome: Progressing   Problem: Elimination: Goal: Will not experience complications related to bowel motility Outcome: Progressing Goal: Will not experience complications related to urinary retention Outcome: Progressing   Problem: Safety: Goal: Ability to remain free from injury will improve Outcome: Progressing   Problem: Skin Integrity: Goal: Risk for impaired skin integrity will decrease Outcome: Progressing

## 2024-06-12 NOTE — Progress Notes (Signed)
 PROGRESS NOTE    [name]  FMW:[id number] DOB: 12-15-1960 DOA: 06/03/2024 PCP: Katina Pfeiffer, PA-C   Brief Narrative:  Evelyn Baker is a 63 y.o. 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 Hgb Trended down but no active Sx of bleeding. Started IV Iron  and sq EPO. Hematology consulted for further evaluation and recommendations. GI Consulted given drop in Hgb and deferring endoscopic evaluation until Hgb is >7. Hgb Low but fairly stable. She will complete 5 days of EPO and anticipate D/C tomorrow if asymptomatic as she will have close follow up with Hematology. She will also need to follow-up with Gastroenterology, Nephrology and Vascular Surgery in outpatient setting  Assessment and Plan:  Acute Left, Femoral Vein DVT: Patient denies any prior history of DVT, no recent surgeries.  Patient stated had a 4-hour car ride recently. -Patient noted the morning of 06/04/2024 with complaints of shortness of breath per RN.   -Patient denies any further significant shortness of breath.  -VQ scan done and showed that the pulmonary perfusion is within normal limits. There are no wedge-shaped perfusion defects to suggest acute pulmonary embolism.. -Patient was on IV heparin  however heparin  levels could not be obtained this morning as pediatric tubes were not available.  As patient states Jehov[employer]'s Witness was concerned about her hemoglobin levels. Discontinued IV heparin  and placed on full dose Lovenox  and subsequently transitioned to Apixaban  on 06/07/24 but started holding 12/12 -Keep left lower extremity elevated.   -Continue scheduled Acetaminophen  1000 mg po TID for pain control.   -Hematology consulted and she will need to see them in outpatient setting for Hypercoaguable workup and appointment is scheduled for 12/16   AKI on CKD Stage 3a / Metabolic  Acidosis: Patient noted to have a creatinine of 1.80 on admission compared to previous baseline of approximately 1.0-1.1. Per chart review patient with a creatinine 1.07 on 04/15/2024. Etiology of AKI likely multifactorial secondary to prerenal azotemia due to decreased oral intake in the setting of ARB. Urinalysis with 100 protein, small leukocytes, nitrite negative, rare bacteria, 11-20 WBCs. Urine sodium at 59, urine creatinine at 83. BUN/Cr Trend: Recent Labs  Lab 06/06/24 0857 06/07/24 0536 06/08/24 0533 06/09/24 0554 06/10/24 0531 06/11/24 0800 06/12/24 0722  BUN 29* 31* 27* 23 26* 24* 25*  CREATININE 1.39* 1.29* 1.18* 1.34* 1.43* 1.48* 1.34*  -Had a slight metabolic acidosis with a CO2 of 21, anion gap of 11, chloride level of 96 -Continue to hold ARB and likely will not resume on discharge. Saline lock IV fluids and give a Dose of IV Lasix  20 mg x1 again -Avoid Nephrotoxic Medications and stop Chlorthalidone , Contrast Dyes, Hypotension and Dehydration to Ensure Adequate Renal Perfusion and will need to Renally Adjust Meds. CTM and Trend Renal Function carefully and repeat CMP in the AM  -Will need outpt F/U w/ Nephrology   Acute Microcytic Anemia:  Hemoglobin noted to 8.3 on admission with prior hemoglobin of 10.0 on 03/20/2024 and prior to that 8.5 on 03/15/2024 but back in April of this year was 12.1. Hgb/Hct continues to Trend down slowly but Patient with no overt bleeding or symptoms as current trend shows:  Recent Labs  Lab 06/06/24 0857 06/07/24 0536 06/08/24 0533 06/09/24 0554 06/10/24 0531 06/11/24 0800 06/12/24 0722  HGB 8.0* 7.2* 6.8* 6.8* 6.6* 6.7* 6.4*  HCT 21.4* 20.0* 19.4* 19.7* 18.0* 18.5* 17.6*  MCV 72.5* 73.0* 75.2* 76.1* 73.2* 73.7* 74.6*  -FOBT negative. Anemia panel with iron  level of 23, TIBC of 165, ferritin of 473, folate of 7.5. Hemoglobin currently at 6.8 today, partly dilutional. Patient is a [employer] and as such does not accept blood products.   -Patient agreeable to IV iron  so gave her a dose of Iron  Sucrose 200 mg x1 and Epoetin  Alfa-epbx 5,000 units on 12/10. On 12/11 gave her a dose of Iron  Sucrose 100 mg x1, IV Folic Acid  1 mg, and Epoetin  Alfa-epbx 20,000 units x1 and this was repeated 12/12, 12/13 and being repeated 12/14 but will increase Epotein Alfa to 40,000 units sq at the recommendation of Oncology  -Hematology consulted for further evaluation and in agreement with current management. Patient to follow up with Hematology as an outpatient eventually and has appointment 06/14/24 -GI also consulted and they had a detailed discussion with the patient's family and patient herself and feels that she cannot undergo sedation for endoscopic evaluation at this time given that her hemoglobin is less than 7. GI has no new Recc's and have signed off the case and will follow in the outpatient setting. -Repeat CT scan of the abdomen pelvis without contrast has been done and it showed No acute abdominopelvic abnormality. No evidence of retroperitoneal hematoma but did show Findings of volume overload with trace right and small left pleural effusions, diffuse body wall edema, trace presacral edema, and pericardial effusion. Aortic Atherosclerosis -Given CT Findings will give IV Lasix  20 mg again. Given 12/12, 12/13 and will give again today.    Hypokalemia: K+ is now 3.9. CTM and Replete as Necessary.   HypoNatremia: Likely hypervolemic hyponatremia; Na+ Trend:  Recent Labs  Lab 06/06/24 0857 06/07/24 0536 06/08/24 0533 06/09/24 0554 06/10/24 0531 06/11/24 0800 06/12/24 0722  NA 130* 132* 134* 131* 129* 129* 130*  -Stop Chlorthalidone  and Give her a dose of low dose Lasix  20 mg x1 again today. CTM and Trend and Repeat CMP in the AM    Essential Hypertension: ARB on hold secondary to AKI. Continue BiDil  20-37.5 mg per tab 2 tabs 3 times daily. C/w Amlodipine  10 mg po Daily and Chlorthalidone  25 mg po Daily. CTM BP per Protocol. Last BP  reading was 135/84. C/w Carvedilol  6.25 mg po BID. Add IV Labetalol  10 mg q2hprn for SBP >180 or DBP >100. Outpatient follow-up with Cardiology.   History of CVA: Plavix  on hold as patient currently on full dose anticoagulation. Continue Rosuvastatin  10 mg po Daily.   Nausea and vomiting: Mild.  Continue with antiemetics w/ Ondansetron  4 mg po/IV q6hprn   Hypothyroidism: C/w Levothyroxine  112 mcg po Daily     Hyperlipidemia: C/w Rosuvastatin  10 mg po Daily    Mixed connective tissue disease/osteoarthritis -Patient being followed by rheumatology, Dr. Dolphus and managed on Azathioprine  100g mg po daily in the outpatient setting. Patient states Imuran  held recently while on antibiotics but Resumed Imuran . Will need Outpatient follow-up with Rheumatology.   Asthma: Stable C/w Symbicort  substitution with Fluticasone  Furoate-Vilanterol 1 puff IH Daily and Albuterol  as needed.   Hypoalbuminemia: Patient's Albumin  Lvl is now 2.9. CTM & Trend & repeat CMP int he AM  Class II Obesity: Complicates overall prognosis and care. Estimated body mass index is 35.05 kg/m as calculated from the following:   Height as of this encounter: 5' 5 (1.651 m).   Weight as of this encounter: 95.5 kg. Weight Loss and Dietary Counseling given   DVT prophylaxis: SCDs  Code Status: Full Code Family Communication: D/w Husband @ bedside  Disposition Plan:  Level of care: Telemetry Status is: Inpatient Remains inpatient appropriate because: Anticipating D/C in the next 24 hours    Consultants:  D/w Vascular Surgery Hematology Gastroenterology  Procedures:  As delineated as above  Antimicrobials:  Anti-infectives (From admission, onward)    None       Subjective: Seen and examined at bedside states that she feels fine and denies any chest pain, lightheadedness or dizziness.  Still having some pain in her leg but states not as bad.  Her leg is l still little swollen.  No nausea or vomiting.  No other  concerns or complaints at this time.  Objective: Vitals:   06/12/24 0837 06/12/24 0854 06/12/24 1231 06/12/24 1233  BP: (!) 166/119  135/84   Pulse: (!) 103  98 98  Resp:   18   Temp:   98.1 F (36.7 C)   TempSrc:      SpO2:  97% 100% 97%  Weight:      Height:        Intake/Output Summary (Last 24 hours) at 06/12/2024 1744 Last data filed at 06/12/2024 1300 Gross per 24 hour  Intake 480 ml  Output 2 ml  Net 478 ml   Filed Weights   06/09/24 0525 06/11/24 0500 06/12/24 0504  Weight: 95.5 kg 96 kg 95.5 kg   Examination: Physical Exam:  Constitutional: WN/WD, obese African-American female in no acute distress Respiratory: Diminished to auscultation bilaterally, no wheezing, rales, rhonchi or crackles. Normal respiratory effort and patient is not tachypenic. No accessory muscle use.  Unlabored breathing Cardiovascular: RRR, no murmurs / rubs / gallops. S1 and S2 auscultated.  1+ extremity edema worse on the left compared to right Abdomen: Soft, non-tender, distended secondary to body habitus. Bowel sounds positive.  GU: Deferred. Musculoskeletal: No clubbing / cyanosis of digits/nails.  Left leg is little bit swollen compared to right. Skin: No rashes, lesions, ulcers on limited skin evaluation. No induration; Warm and dry.  Neurologic: CN 2-12 grossly intact with no focal deficits. Romberg sign and cerebellar reflexes not assessed.  Psychiatric: Normal judgment and insight. Alert and oriented x 3. Normal mood and appropriate affect.   Data Reviewed: I have personally reviewed following labs and imaging studies  CBC: Recent Labs  Lab 06/08/24 0533 06/09/24 0554 06/10/24 0531 06/11/24 0800 06/12/24 0722  WBC 5.1 4.5 5.4 5.3 5.0  NEUTROABS  --  3.2 4.0 3.8 3.8  HGB 6.8* 6.8* 6.6* 6.7* 6.4*  HCT 19.4* 19.7* 18.0* 18.5* 17.6*  MCV 75.2* 76.1* 73.2* 73.7* 74.6*  PLT 201 239 208 219 225   Basic Metabolic Panel: Recent Labs  Lab 06/08/24 0533 06/09/24 0554  06/10/24 0531 06/11/24 0800 06/12/24 0722  NA 134* 131* 129* 129* 130*  K 3.4* 3.9 3.7 3.4* 3.9  CL 103 99 97* 96* 99  CO2 22 20* 22 21* 22  GLUCOSE 88 86 103* 122* 89  BUN 27* 23 26* 24* 25*  CREATININE 1.18* 1.34* 1.43* 1.48* 1.34*  CALCIUM  8.5* 8.7* 8.6* 8.6* 8.5*  MG  --  2.0 2.0 2.0 2.0  PHOS  --  3.3 3.3 3.6 3.3   GFR: Estimated Creatinine Clearance: 50.4 mL/min (A) (by C-G formula based on SCr of 1.34 mg/dL (H)). Liver Function Tests: Recent Labs  Lab 06/09/24 0554 06/10/24 0531 06/11/24 0800 06/12/24 0722  AST 36 53* 41 40  ALT 17 32 28 34  ALKPHOS 128* 182* 168*  144*  BILITOT 0.4 0.3 0.4 0.3  PROT 6.2* 6.0* 6.4* 6.2*  ALBUMIN  2.8* 2.9* 3.0* 2.9*   No results for input(s): LIPASE, AMYLASE in the last 168 hours. No results for input(s): AMMONIA in the last 168 hours. Coagulation Profile: No results for input(s): INR, PROTIME in the last 168 hours. Cardiac Enzymes: No results for input(s): CKTOTAL, CKMB, CKMBINDEX, TROPONINI in the last 168 hours. BNP (last 3 results) No results for input(s): PROBNP in the last 8760 hours. HbA1C: No results for input(s): HGBA1C in the last 72 hours. CBG: No results for input(s): GLUCAP in the last 168 hours. Lipid Profile: No results for input(s): CHOL, HDL, LDLCALC, TRIG, CHOLHDL, LDLDIRECT in the last 72 hours. Thyroid  Function Tests: No results for input(s): TSH, T4TOTAL, FREET4, T3FREE, THYROIDAB in the last 72 hours. Anemia Panel: No results for input(s): VITAMINB12, FOLATE, FERRITIN, TIBC, IRON , RETICCTPCT in the last 72 hours. Sepsis Labs: No results for input(s): PROCALCITON, LATICACIDVEN in the last 168 hours.  Recent Results (from the past 240 hours)  Urine Culture (for pregnant, neutropenic or urologic patients or patients with an indwelling urinary catheter)     Status: None   Collection Time: 06/04/24  1:45 PM   Specimen: Urine, Clean Catch  Result  Value Ref Range Status   Specimen Description   Final    URINE, CLEAN CATCH Performed at [facility], [address] [place], [place] [address]    Special Requests   Final    NONE Performed at [facility], [address], [place], [place] [address]    Culture   Final    NO GROWTH Performed at [facility], [address], [place], [place] [address]    Report Status 06/05/2024 FINAL  Final    Radiology Studies: No results found.  Scheduled Meds:  acetaminophen   1,000 mg Oral TID   amLODipine   10 mg Oral Daily   azaTHIOprine   100 mg Oral Daily   benzonatate   100 mg Oral TID   budesonide -formoterol   2 puff Inhalation BID   carvedilol   6.25 mg Oral BID WC   vitamin B-12  1,000 mcg Oral Daily   ferrous sulfate   325 mg Oral TID WC   furosemide   20 mg Intravenous Once   isosorbide -hydrALAZINE   2 tablet Oral TID   levothyroxine   112 mcg Oral Q0600   pantoprazole   40 mg Oral BID   rosuvastatin   10 mg Oral Daily   sodium bicarbonate   650 mg Oral BID   sodium chloride  flush  3 mL Intravenous Q12H   Continuous Infusions:   LOS: 9 days   Alejandro Marker, DO [facility] Available via Epic secure chat 7am-7pm After these hours, please refer to coverage provider listed on [email or web address] 06/12/2024, 5:44 PM

## 2024-06-13 ENCOUNTER — Other Ambulatory Visit (HOSPITAL_COMMUNITY): Payer: Self-pay

## 2024-06-13 LAB — CBC WITH DIFFERENTIAL/PLATELET
Abs Immature Granulocytes: 0.42 K/uL — ABNORMAL HIGH (ref 0.00–0.07)
Basophils Absolute: 0 K/uL (ref 0.0–0.1)
Basophils Relative: 0 %
Eosinophils Absolute: 0 K/uL (ref 0.0–0.5)
Eosinophils Relative: 1 %
HCT: 19.1 % — ABNORMAL LOW (ref 36.0–46.0)
Hemoglobin: 7 g/dL — ABNORMAL LOW (ref 12.0–15.0)
Immature Granulocytes: 6 %
Lymphocytes Relative: 9 %
Lymphs Abs: 0.6 K/uL — ABNORMAL LOW (ref 0.7–4.0)
MCH: 27.2 pg (ref 26.0–34.0)
MCHC: 36.6 g/dL — ABNORMAL HIGH (ref 30.0–36.0)
MCV: 74.3 fL — ABNORMAL LOW (ref 80.0–100.0)
Monocytes Absolute: 0.5 K/uL (ref 0.1–1.0)
Monocytes Relative: 8 %
Neutro Abs: 5 K/uL (ref 1.7–7.7)
Neutrophils Relative %: 76 %
Platelets: 267 K/uL (ref 150–400)
RBC: 2.57 MIL/uL — ABNORMAL LOW (ref 3.87–5.11)
RDW: 17.5 % — ABNORMAL HIGH (ref 11.5–15.5)
WBC: 6.6 K/uL (ref 4.0–10.5)
nRBC: 0.5 % — ABNORMAL HIGH (ref 0.0–0.2)

## 2024-06-13 LAB — COMPREHENSIVE METABOLIC PANEL WITH GFR
ALT: 32 U/L (ref 0–44)
AST: 43 U/L — ABNORMAL HIGH (ref 15–41)
Albumin: 3.2 g/dL — ABNORMAL LOW (ref 3.5–5.0)
Alkaline Phosphatase: 144 U/L — ABNORMAL HIGH (ref 38–126)
Anion gap: 10 (ref 5–15)
BUN: 19 mg/dL (ref 8–23)
CO2: 25 mmol/L (ref 22–32)
Calcium: 8.8 mg/dL — ABNORMAL LOW (ref 8.9–10.3)
Chloride: 98 mmol/L (ref 98–111)
Creatinine, Ser: 1.26 mg/dL — ABNORMAL HIGH (ref 0.44–1.00)
GFR, Estimated: 48 mL/min — ABNORMAL LOW (ref >60)
Glucose, Bld: 94 mg/dL (ref 70–99)
Potassium: 3.6 mmol/L (ref 3.5–5.1)
Sodium: 132 mmol/L — ABNORMAL LOW (ref 135–145)
Total Bilirubin: 0.4 mg/dL (ref 0.0–1.2)
Total Protein: 6.6 g/dL (ref 6.5–8.1)

## 2024-06-13 LAB — MAGNESIUM: Magnesium: 1.8 mg/dL (ref 1.7–2.4)

## 2024-06-13 LAB — PHOSPHORUS: Phosphorus: 2.9 mg/dL (ref 2.5–4.6)

## 2024-06-13 MED ORDER — CYANOCOBALAMIN 1000 MCG PO TABS
1000.0000 ug | ORAL_TABLET | Freq: Every day | ORAL | 0 refills | Status: AC
  Filled 2024-06-13: qty 30, 30d supply, fill #0

## 2024-06-13 MED ORDER — CARVEDILOL 6.25 MG PO TABS
6.2500 mg | ORAL_TABLET | Freq: Two times a day (BID) | ORAL | 0 refills | Status: AC
  Filled 2024-06-13: qty 60, 30d supply, fill #0

## 2024-06-13 MED ORDER — SODIUM CHLORIDE 0.9 % IV SOLN
100.0000 mg | Freq: Once | INTRAVENOUS | Status: AC
  Administered 2024-06-13: 10:00:00 100 mg via INTRAVENOUS
  Filled 2024-06-13: qty 5

## 2024-06-13 MED ORDER — MAGNESIUM SULFATE 2 GM/50ML IV SOLN
2.0000 g | Freq: Once | INTRAVENOUS | Status: AC
  Administered 2024-06-13: 11:00:00 2 g via INTRAVENOUS
  Filled 2024-06-13: qty 50

## 2024-06-13 MED ORDER — PANTOPRAZOLE SODIUM 40 MG PO TBEC
40.0000 mg | DELAYED_RELEASE_TABLET | Freq: Two times a day (BID) | ORAL | 0 refills | Status: DC
  Filled 2024-06-13: qty 60, 30d supply, fill #0

## 2024-06-13 MED ORDER — FOLIC ACID 5 MG/ML IJ SOLN
1.0000 mg | Freq: Once | INTRAMUSCULAR | Status: AC
  Administered 2024-06-13: 11:00:00 1 mg via INTRAVENOUS
  Filled 2024-06-13: qty 0.2

## 2024-06-13 MED ORDER — SENNOSIDES-DOCUSATE SODIUM 8.6-50 MG PO TABS
1.0000 | ORAL_TABLET | Freq: Every evening | ORAL | 0 refills | Status: DC | PRN
  Filled 2024-06-13: qty 30, 30d supply, fill #0

## 2024-06-13 MED ORDER — FOLIC ACID 1 MG PO TABS
1.0000 mg | ORAL_TABLET | Freq: Every day | ORAL | 0 refills | Status: DC
  Filled 2024-06-13: qty 30, 30d supply, fill #0

## 2024-06-13 MED ORDER — ACETAMINOPHEN 500 MG PO TABS
1000.0000 mg | ORAL_TABLET | Freq: Three times a day (TID) | ORAL | 0 refills | Status: AC | PRN
  Filled 2024-06-13: qty 30, 5d supply, fill #0

## 2024-06-13 MED ORDER — FERROUS SULFATE 325 (65 FE) MG PO TABS
325.0000 mg | ORAL_TABLET | Freq: Three times a day (TID) | ORAL | 0 refills | Status: AC
  Filled 2024-06-13: qty 100, 34d supply, fill #0

## 2024-06-13 MED ORDER — EPOETIN ALFA 20000 UNIT/ML IJ SOLN
40000.0000 [IU] | Freq: Once | INTRAMUSCULAR | Status: AC
  Administered 2024-06-13: 10:00:00 40000 [IU] via SUBCUTANEOUS
  Filled 2024-06-13: qty 2

## 2024-06-13 MED ORDER — FOLIC ACID 1 MG PO TABS: 1.0000 mg | ORAL_TABLET | Freq: Every day | ORAL | Status: DC

## 2024-06-13 MED ORDER — ONDANSETRON HCL 4 MG PO TABS
4.0000 mg | ORAL_TABLET | Freq: Four times a day (QID) | ORAL | 0 refills | Status: DC | PRN
  Filled 2024-06-13: qty 20, 5d supply, fill #0

## 2024-06-13 MED ORDER — AMLODIPINE BESYLATE 10 MG PO TABS
10.0000 mg | ORAL_TABLET | Freq: Every day | ORAL | 0 refills | Status: DC
  Filled 2024-06-13: qty 30, 30d supply, fill #0

## 2024-06-13 NOTE — Progress Notes (Signed)
 Discharge meds in a secure bag delivered to patient in room by this RN

## 2024-06-13 NOTE — Plan of Care (Signed)
°  Problem: Education: Goal: Knowledge of General Education information will improve Description: Including pain rating scale, medication(s)/side effects and non-pharmacologic comfort measures Outcome: Progressing   Problem: Health Behavior/Discharge Planning: Goal: Ability to manage health-related needs will improve Outcome: Progressing   Problem: Clinical Measurements: Goal: Ability to maintain clinical measurements within normal limits will improve Outcome: Progressing Goal: Will remain free from infection Outcome: Progressing Goal: Respiratory complications will improve Outcome: Progressing   Problem: Activity: Goal: Risk for activity intolerance will decrease Outcome: Progressing   Problem: Nutrition: Goal: Adequate nutrition will be maintained Outcome: Progressing   Problem: Safety: Goal: Ability to remain free from injury will improve Outcome: Progressing   Problem: Skin Integrity: Goal: Risk for impaired skin integrity will decrease Outcome: Progressing

## 2024-06-14 ENCOUNTER — Encounter: Payer: Self-pay | Admitting: Rheumatology

## 2024-06-14 ENCOUNTER — Other Ambulatory Visit (HOSPITAL_COMMUNITY): Payer: Self-pay

## 2024-06-14 ENCOUNTER — Inpatient Hospital Stay: Admitting: Oncology

## 2024-06-14 ENCOUNTER — Other Ambulatory Visit (HOSPITAL_BASED_OUTPATIENT_CLINIC_OR_DEPARTMENT_OTHER): Payer: Self-pay

## 2024-06-14 ENCOUNTER — Encounter: Payer: Self-pay | Admitting: Internal Medicine

## 2024-06-14 ENCOUNTER — Encounter: Payer: Self-pay | Admitting: Cardiology

## 2024-06-14 ENCOUNTER — Inpatient Hospital Stay: Attending: Oncology

## 2024-06-14 VITALS — BP 161/94 | HR 90 | Temp 97.7°F | Resp 15 | Wt 209.9 lb

## 2024-06-14 DIAGNOSIS — Z880 Allergy status to penicillin: Secondary | ICD-10-CM | POA: Insufficient documentation

## 2024-06-14 DIAGNOSIS — Z8673 Personal history of transient ischemic attack (TIA), and cerebral infarction without residual deficits: Secondary | ICD-10-CM | POA: Insufficient documentation

## 2024-06-14 DIAGNOSIS — Z823 Family history of stroke: Secondary | ICD-10-CM | POA: Insufficient documentation

## 2024-06-14 DIAGNOSIS — Z7401 Bed confinement status: Secondary | ICD-10-CM | POA: Insufficient documentation

## 2024-06-14 DIAGNOSIS — I8290 Acute embolism and thrombosis of unspecified vein: Secondary | ICD-10-CM | POA: Diagnosis not present

## 2024-06-14 DIAGNOSIS — E785 Hyperlipidemia, unspecified: Secondary | ICD-10-CM | POA: Insufficient documentation

## 2024-06-14 DIAGNOSIS — F32A Depression, unspecified: Secondary | ICD-10-CM | POA: Insufficient documentation

## 2024-06-14 DIAGNOSIS — K449 Diaphragmatic hernia without obstruction or gangrene: Secondary | ICD-10-CM | POA: Insufficient documentation

## 2024-06-14 DIAGNOSIS — J90 Pleural effusion, not elsewhere classified: Secondary | ICD-10-CM | POA: Diagnosis not present

## 2024-06-14 DIAGNOSIS — I871 Compression of vein: Secondary | ICD-10-CM | POA: Diagnosis not present

## 2024-06-14 DIAGNOSIS — I3139 Other pericardial effusion (noninflammatory): Secondary | ICD-10-CM | POA: Diagnosis not present

## 2024-06-14 DIAGNOSIS — E039 Hypothyroidism, unspecified: Secondary | ICD-10-CM | POA: Insufficient documentation

## 2024-06-14 DIAGNOSIS — Z79890 Hormone replacement therapy (postmenopausal): Secondary | ICD-10-CM | POA: Insufficient documentation

## 2024-06-14 DIAGNOSIS — J84116 Cryptogenic organizing pneumonia: Secondary | ICD-10-CM | POA: Insufficient documentation

## 2024-06-14 DIAGNOSIS — D631 Anemia in chronic kidney disease: Secondary | ICD-10-CM | POA: Insufficient documentation

## 2024-06-14 DIAGNOSIS — D649 Anemia, unspecified: Secondary | ICD-10-CM

## 2024-06-14 DIAGNOSIS — K5900 Constipation, unspecified: Secondary | ICD-10-CM | POA: Insufficient documentation

## 2024-06-14 DIAGNOSIS — I700 Atherosclerosis of aorta: Secondary | ICD-10-CM | POA: Insufficient documentation

## 2024-06-14 DIAGNOSIS — Z90710 Acquired absence of both cervix and uterus: Secondary | ICD-10-CM | POA: Insufficient documentation

## 2024-06-14 DIAGNOSIS — I129 Hypertensive chronic kidney disease with stage 1 through stage 4 chronic kidney disease, or unspecified chronic kidney disease: Secondary | ICD-10-CM | POA: Insufficient documentation

## 2024-06-14 DIAGNOSIS — Z7901 Long term (current) use of anticoagulants: Secondary | ICD-10-CM | POA: Diagnosis not present

## 2024-06-14 DIAGNOSIS — Z885 Allergy status to narcotic agent status: Secondary | ICD-10-CM | POA: Insufficient documentation

## 2024-06-14 DIAGNOSIS — J811 Chronic pulmonary edema: Secondary | ICD-10-CM | POA: Diagnosis not present

## 2024-06-14 DIAGNOSIS — I8390 Asymptomatic varicose veins of unspecified lower extremity: Secondary | ICD-10-CM | POA: Diagnosis not present

## 2024-06-14 DIAGNOSIS — I82412 Acute embolism and thrombosis of left femoral vein: Secondary | ICD-10-CM | POA: Diagnosis not present

## 2024-06-14 DIAGNOSIS — Z79899 Other long term (current) drug therapy: Secondary | ICD-10-CM | POA: Diagnosis not present

## 2024-06-14 DIAGNOSIS — Z91041 Radiographic dye allergy status: Secondary | ICD-10-CM | POA: Insufficient documentation

## 2024-06-14 DIAGNOSIS — M62838 Other muscle spasm: Secondary | ICD-10-CM | POA: Diagnosis not present

## 2024-06-14 DIAGNOSIS — D509 Iron deficiency anemia, unspecified: Secondary | ICD-10-CM

## 2024-06-14 DIAGNOSIS — N189 Chronic kidney disease, unspecified: Secondary | ICD-10-CM | POA: Diagnosis not present

## 2024-06-14 DIAGNOSIS — Z8249 Family history of ischemic heart disease and other diseases of the circulatory system: Secondary | ICD-10-CM | POA: Insufficient documentation

## 2024-06-14 DIAGNOSIS — E8770 Fluid overload, unspecified: Secondary | ICD-10-CM | POA: Diagnosis not present

## 2024-06-14 LAB — CBC WITH DIFFERENTIAL (CANCER CENTER ONLY)
Abs Immature Granulocytes: 0.45 K/uL — ABNORMAL HIGH (ref 0.00–0.07)
Basophils Absolute: 0 K/uL (ref 0.0–0.1)
Basophils Relative: 0 %
Eosinophils Absolute: 0 K/uL (ref 0.0–0.5)
Eosinophils Relative: 1 %
HCT: 21.5 % — ABNORMAL LOW (ref 36.0–46.0)
Hemoglobin: 7.8 g/dL — ABNORMAL LOW (ref 12.0–15.0)
Immature Granulocytes: 7 %
Lymphocytes Relative: 11 %
Lymphs Abs: 0.7 K/uL (ref 0.7–4.0)
MCH: 27 pg (ref 26.0–34.0)
MCHC: 36.3 g/dL — ABNORMAL HIGH (ref 30.0–36.0)
MCV: 74.4 fL — ABNORMAL LOW (ref 80.0–100.0)
Monocytes Absolute: 0.4 K/uL (ref 0.1–1.0)
Monocytes Relative: 7 %
Neutro Abs: 4.8 K/uL (ref 1.7–7.7)
Neutrophils Relative %: 74 %
Platelet Count: 279 K/uL (ref 150–400)
RBC: 2.89 MIL/uL — ABNORMAL LOW (ref 3.87–5.11)
RDW: 18.3 % — ABNORMAL HIGH (ref 11.5–15.5)
WBC Count: 6.4 K/uL (ref 4.0–10.5)
nRBC: 1.2 % — ABNORMAL HIGH (ref 0.0–0.2)

## 2024-06-14 LAB — LACTATE DEHYDROGENASE: LDH: 216 U/L (ref 105–235)

## 2024-06-14 LAB — DIRECT ANTIGLOBULIN TEST (NOT AT ARMC)
DAT, IgG: NEGATIVE
DAT, complement: NEGATIVE

## 2024-06-14 MED ORDER — APIXABAN 5 MG PO TABS
5.0000 mg | ORAL_TABLET | Freq: Two times a day (BID) | ORAL | 3 refills | Status: AC
  Filled 2024-06-14: qty 60, 30d supply, fill #0
  Filled 2024-07-17: qty 60, 30d supply, fill #1

## 2024-06-14 MED ORDER — FOLIC ACID 1 MG PO TABS
1.0000 mg | ORAL_TABLET | Freq: Every day | ORAL | 0 refills | Status: DC
  Filled 2024-06-14 (×2): qty 30, 30d supply, fill #0

## 2024-06-14 NOTE — Progress Notes (Unsigned)
 [facility]  HEMATOLOGY CLINIC NOTE   PATIENT NAME: Evelyn Baker   MR#: [id number] DOB: [birth year]  DATE OF SERVICE: 06/14/2024   REFERRING PROVIDER  Hospital follow up  Patient Care Team: Katina Pfeiffer, PA-C as PCP - General (Family Medicine) Michele Richardson, DO as PCP - Cardiology (Cardiology)   REASON FOR CONSULTATION/ CHIEF COMPLAINT:  Anemia.  Patient refuses blood products for reasons of conscience   ASSESSMENT & PLAN:  SAMAMTHA TIEGS is a 63 y.o. lady, who is a Tefl Teacher Witness, with a past medical history of ***, was referred to our service for evaluation of ***.    No problem-specific Assessment & Plan notes found for this encounter.   Assessment and Plan Assessment & Plan       I reviewed lab results and outside records for this visit and discussed relevant results with the patient. Diagnosis, plan of care and treatment options were also discussed in detail with the patient. Opportunity provided to ask questions and answers provided to her apparent satisfaction. Provided instructions to call our clinic with any problems, questions or concerns prior to return visit. I recommended to continue follow-up with PCP and sub-specialists. She verbalized understanding and agreed with the plan. No barriers to learning was detected.  [name], MD  06/14/2024 9:40 AM  [facility] - A DEPT OF [facility] [id number] [place] [address] Dept: [phone] Dept Fax: [phone]   HISTORY OF PRESENT ILLNESS: ***  Discussed the use of AI scribe software for clinical note transcription with the patient, who gave verbal consent to proceed.  History of Present Illness      She denies fever, cough, diarrhea, or other infectious symptoms.  She denies epistaxis, bloody stool, melena, hematuria, bruising or other bleeding symptoms. She also denies unintentional weight  loss, night sweats or other constitutional symptoms.  MEDICAL HISTORY Past Medical History:  Diagnosis Date   Asthma    Depression    Dyspnea    Hyperlipidemia    Hypertension    Hypothyroidism    Sciatic nerve disease    Thyroid  disease      SURGICAL HISTORY Past Surgical History:  Procedure Laterality Date   FOOT SURGERY Left    STRABISMUS SURGERY     age 76   TOTAL ABDOMINAL HYSTERECTOMY     VEIN LIGATION AND STRIPPING Left 01/22/2021   Procedure: LEFT GREATER SAPHENOUS VEIN  LIGATION AND STRIPPING;  Surgeon: Harvey Carlin BRAVO, MD;  Location: [facility] OR;  Service: Vascular;  Laterality: Left;     SOCIAL HISTORY: She reports that she has never smoked. She has never been exposed to tobacco smoke. She has never used smokeless tobacco. She reports that she does not drink alcohol and does not use drugs. Social History   Socioeconomic History   Marital status: Married    Spouse name: Not on file   Number of children: 4   Years of education: Not on file   Highest education level: Not on file  Occupational History   Not on file  Tobacco Use   Smoking status: Never    Passive exposure: Never   Smokeless tobacco: Never  Vaping Use   Vaping status: Never Used  Substance and Sexual Activity   Alcohol use: No   Drug use: No   Sexual activity: Yes    Birth control/protection: Surgical    Comment: partial hysterectomy  Other Topics Concern  Not on file  Social History Narrative   Not on file   Social Drivers of Health   Tobacco Use: Low Risk (06/03/2024)   Patient History    Smoking Tobacco Use: Never    Smokeless Tobacco Use: Never    Passive Exposure: Never  Financial Resource Strain: Not on file  Food Insecurity: No Food Insecurity (06/03/2024)   Epic    Worried About Programme Researcher, Broadcasting/film/video in the Last Year: Never true    Ran Out of Food in the Last Year: Never true  Transportation Needs: No Transportation Needs (06/03/2024)   Epic    Lack of Transportation (Medical):  No    Lack of Transportation (Non-Medical): No  Physical Activity: Not on file  Stress: Not on file  Social Connections: Socially Integrated (06/04/2024)   Social Connection and Isolation Panel    Frequency of Communication with Friends and Family: More than three times a week    Frequency of Social Gatherings with Friends and Family: More than three times a week    Attends Religious Services: More than 4 times per year    Active Member of [employer] or Organizations: Yes    Attends Banker Meetings: More than 4 times per year    Marital Status: Married  Catering Manager Violence: Not At Risk (06/03/2024)   Epic    Fear of Current or Ex-Partner: No    Emotionally Abused: No    Physically Abused: No    Sexually Abused: No  Depression (PHQ2-9): Not on file  Alcohol Screen: Not on file  Housing: Low Risk (06/03/2024)   Epic    Unable to Pay for Housing in the Last Year: No    Number of Times Moved in the Last Year: 0    Homeless in the Last Year: No  Utilities: Not At Risk (06/03/2024)   Epic    Threatened with loss of utilities: No  Health Literacy: Not on file    FAMILY HISTORY: Her family history includes Aneurysm in her sister; Heart Problems in her brother; Heart failure in her mother; Hypertension in her daughter and son; Kidney failure in her mother; Stroke in her sister.  CURRENT MEDICATIONS   Current Outpatient Medications  Medication Instructions   Acetaminophen  Extra Strength 1,000 mg, Oral, Every 8 hours PRN   albuterol  (VENTOLIN  HFA) 108 (90 Base) MCG/ACT inhaler 2 puffs, Every 4 hours PRN   amLODipine  (NORVASC ) 10 mg, Oral, Daily   apixaban  (ELIQUIS ) 5 mg, Oral, 2 times daily   azathioprine  (IMURAN ) 100 MG tablet Take 1 tablet (100 mg total) by mouth daily as directed   B-12 1,000 mcg, Oral, Daily   budesonide -formoterol  (SYMBICORT ) 80-4.5 MCG/ACT inhaler 2 puffs, Inhalation, 2 times daily   carvedilol  (COREG ) 6.25 mg, Oral, 2 times daily with meals    EPINEPHrine  0.3 mg/0.3 mL IJ SOAJ injection Inject 0.3 mg into the muscle as needed as directed.   FeroSul 325 mg, Oral, 3 times daily with meals   folic acid  (FOLVITE ) 1 mg, Oral, Daily   hydrALAZINE  (APRESOLINE ) 25 MG tablet Take 1 tablet (25 mg) once daily as needed for systolic blood pressure > 170   isosorbide -hydrALAZINE  (BIDIL ) 20-37.5 MG tablet 1 tablet, Oral, 3 times daily   levothyroxine  (SYNTHROID ) 112 MCG tablet Take 1 tablet (112 mcg total) by mouth in the morning on an empty stomach.   ondansetron  (ZOFRAN ) 4 mg, Oral, Every 6 hours PRN   pantoprazole  (PROTONIX ) 40 mg, Oral, 2 times daily  potassium chloride  (MICRO-K ) 10 MEQ CR capsule 10 mEq, Oral, 2 times daily   rosuvastatin  (CRESTOR ) 10 mg, Oral, Daily   senna-docusate (SENOKOT-S) 8.6-50 MG tablet 1 tablet, Oral, At bedtime PRN     ALLERGIES  She is allergic to shellfish allergy, avocado, codeine , iodinated contrast media, pineapple, and penicillins.  REVIEW OF SYSTEMS:  Review of Systems - Oncology   Rest of the pertinent review of systems is unremarkable except as mentioned above in HPI.  PHYSICAL EXAMINATION:    Onc Performance Status - 06/14/24 0916       ECOG Perf Status   ECOG Perf Status Ambulatory and capable of all selfcare but unable to carry out any work activities.  Up and about more than 50% of waking hours      KPS SCALE   KPS % SCORE Normal activity with effort, some s/s of disease          Vitals:   06/14/24 0903 06/14/24 0904  BP: (!) 167/96 (!) 161/94  Pulse: 90   Resp: 15   Temp: 97.7 F (36.5 C)   SpO2: 96%    Filed Weights   06/14/24 0903  Weight: 209 lb 14.4 oz (95.2 kg)    Physical Exam Constitutional:      General: She is not in acute distress.    Appearance: Normal appearance.  HENT:     Head: Normocephalic and atraumatic.  Cardiovascular:     Rate and Rhythm: Normal rate.  Pulmonary:     Effort: Pulmonary effort is normal. No respiratory distress.  Abdominal:      General: There is no distension.  Neurological:     General: No focal deficit present.     Mental Status: She is alert and oriented to person, place, and time.  Psychiatric:        Mood and Affect: Mood normal.        Behavior: Behavior normal.      LABORATORY DATA:   I have reviewed the data as listed.  Results for orders placed or performed in visit on 06/14/24  Lactate dehydrogenase  Result Value Ref Range   LDH 216 105 - 235 U/L  CBC with Differential (Cancer Center Only)  Result Value Ref Range   WBC Count 6.4 4.0 - 10.5 K/uL   RBC 2.89 (L) 3.87 - 5.11 MIL/uL   Hemoglobin 7.8 (L) 12.0 - 15.0 g/dL   HCT 78.4 (L) 63.9 - 53.9 %   MCV 74.4 (L) 80.0 - 100.0 fL   MCH 27.0 26.0 - 34.0 pg   MCHC 36.3 (H) 30.0 - 36.0 g/dL   RDW 81.6 (H) 88.4 - 84.4 %   Platelet Count 279 150 - 400 K/uL   nRBC 1.2 (H) 0.0 - 0.2 %   Neutrophils Relative % 74 %   Neutro Abs 4.8 1.7 - 7.7 K/uL   Lymphocytes Relative 11 %   Lymphs Abs 0.7 0.7 - 4.0 K/uL   Monocytes Relative 7 %   Monocytes Absolute 0.4 0.1 - 1.0 K/uL   Eosinophils Relative 1 %   Eosinophils Absolute 0.0 0.0 - 0.5 K/uL   Basophils Relative 0 %   Basophils Absolute 0.0 0.0 - 0.1 K/uL   Immature Granulocytes 7 %   Abs Immature Granulocytes 0.45 (H) 0.00 - 0.07 K/uL     RADIOGRAPHIC STUDIES:  I have personally reviewed the radiological images as listed and agreed with the findings in the report.  CT ABDOMEN PELVIS WO CONTRAST Result Date: 06/10/2024  CLINICAL DATA:  Anemia.  Concern for retroperitoneal hematoma. EXAM: CT ABDOMEN AND PELVIS WITHOUT CONTRAST TECHNIQUE: Multidetector CT imaging of the abdomen and pelvis was performed following the standard protocol without IV contrast. RADIATION DOSE REDUCTION: This exam was performed according to the departmental dose-optimization program which includes automated exposure control, adjustment of the mA and/or kV according to patient size and/or use of iterative reconstruction  technique. COMPARISON:  CT abdomen and pelvis dated 03/20/2024 FINDINGS: Lower chest: Bilateral lower lobe relaxation atelectasis. Trace right and small left pleural effusions. Partially imaged heart size is normal. Trace pericardial effusion. Hepatobiliary: No focal hepatic lesions. No intra or extrahepatic biliary ductal dilation. Normal gallbladder. Pancreas: No focal lesions or main ductal dilation. Spleen: Normal in size without focal abnormality. Adrenals/Urinary Tract: No adrenal nodules. No suspicious renal mass on this noncontrast enhanced examination, calculi or hydronephrosis. No focal bladder wall thickening. Stomach/Bowel: Small hiatal hernia. Normal appearance of the stomach. No evidence of bowel wall thickening, distention, or inflammatory changes. Small and large bowel are diffusely underdistended. Normal appendix. Vascular/Lymphatic: Aortic atherosclerosis. Superficial varicose veins anterior to the pubis. No enlarged abdominal or pelvic lymph nodes. Reproductive: No adnexal masses. Other: Trace presacral free fluid.  No free air or fluid collection. Musculoskeletal: No acute or abnormal lytic or blastic osseous lesions. Diffuse body wall edema. IMPRESSION: 1. No acute abdominopelvic abnormality. No evidence of retroperitoneal hematoma. 2. Findings of volume overload with trace right and small left pleural effusions, diffuse body wall edema, trace presacral edema, and pericardial effusion. 3.  Aortic Atherosclerosis (ICD10-I70.0). Electronically Signed   By: Limin  Xu M.D.   On: 06/10/2024 16:05   NM Pulmonary Perfusion Result Date: 06/06/2024 CLINICAL DATA:  Shortness of breath on minimal exertion. Deep venous thrombosis. EXAM: NUCLEAR MEDICINE PERFUSION LUNG SCAN TECHNIQUE: Perfusion images were obtained in multiple projections after intravenous injection of radiopharmaceutical. No ventilation imaging obtained. RADIOPHARMACEUTICALS:  4.4 mCi Tc-6m MAA IV COMPARISON:  Chest radiographs  06/05/2024.  Chest CT 03/22/2024 FINDINGS: The pulmonary perfusion is within normal limits. There are no wedge-shaped perfusion defects to suggest acute pulmonary embolism. IMPRESSION: No evidence of acute pulmonary embolism on perfusion scintigraphy by PISAPED criteria. Electronically Signed   By: Elsie Perone M.D.   On: 06/06/2024 15:36   DG CHEST PORT 1 VIEW Result Date: 06/05/2024 EXAM: 1 VIEW(S) XRAY OF THE CHEST 06/05/2024 08:56:00 PM COMPARISON: 06/04/2024 CLINICAL HISTORY: Respiratory abnormality FINDINGS: LUNGS AND PLEURA: Mild pulmonary edema. Mild bilateral interstitial prominence. Mild bilateral peribronchial cuffing. No pleural effusion. No pneumothorax. HEART AND MEDIASTINUM: Borderline cardiomegaly. No acute abnormality of the mediastinal silhouette. BONES AND SOFT TISSUES: No acute osseous abnormality. IMPRESSION: 1. Mild pulmonary edema. Electronically signed by: Morgane Naveau MD 06/05/2024 11:15 PM EST RP Workstation: [id number]   US  RENAL Result Date: 06/04/2024 EXAM: US  Retroperitoneum Complete, Renal. 06/04/2024 05:56:29 PM TECHNIQUE: Real-time ultrasonography of the retroperitoneum renal was performed. COMPARISON: None available CLINICAL HISTORY: Acute renal failure. FINDINGS: FINDINGS: RIGHT KIDNEY/URETER: Right kidney measures 12.5 x 4.9 x 5.7 cm. Mildly increased echotexture. No hydronephrosis. No calculus. No mass. LEFT KIDNEY/URETER: Left kidney measures 11.5 x 5.4 x 4.5 cm. Mildly increased echotexture. No hydronephrosis. No calculus. No mass. BLADDER: Unremarkable appearance of the bladder. IMPRESSION: 1. Mildly increased renal cortical echotexture bilaterally, which can be seen with medical renal disease or acute kidney injury. Electronically signed by: Franky Crease MD 06/04/2024 07:41 PM EST RP Workstation: [id number]   DG Chest Port 1 View Result Date: 06/04/2024 EXAM: 1 VIEW(S) XRAY OF THE CHEST  06/04/2024 09:11:00 AM COMPARISON: 03/20/2024 CLINICAL HISTORY: Shortness of  breath. FINDINGS: LUNGS AND PLEURA: Airway thickening suggests bronchitis or reactive airways disease. Mild upper zone pulmonary vascular prominence, cannot exclude pulmonary venous hypertension. No focal pulmonary opacity. No pleural effusion. No pneumothorax. HEART AND MEDIASTINUM: Mild cardiomegaly. No acute abnormality of the mediastinal silhouette. BONES AND SOFT TISSUES: No acute osseous abnormality. IMPRESSION: 1. Airway thickening suggestive of bronchitis or reactive airways disease. 2. Mild upper zone pulmonary vascular prominence, cannot exclude pulmonary venous hypertension. 3. Mild cardiomegaly. Electronically signed by: Ryan Salvage MD 06/04/2024 01:17 PM EST RP Workstation: [id number]   US  Venous Img Lower  Left (DVT Study) Result Date: 06/03/2024 CLINICAL DATA:  left leg edema EXAM: LEFT LOWER EXTREMITY VENOUS DOPPLER ULTRASOUND TECHNIQUE: Gray-scale sonography with graded compression, as well as color Doppler and duplex ultrasound were performed to evaluate the lower extremity deep venous systems from the level of the common femoral vein and including the common femoral, femoral, profunda femoral, popliteal and calf veins including the posterior tibial, peroneal and gastrocnemius veins when visible. The superficial great saphenous vein was also interrogated. Spectral Doppler was utilized to evaluate flow at rest and with distal augmentation maneuvers in the common femoral, femoral and popliteal veins. COMPARISON:  None Available. FINDINGS: Contralateral Common Femoral Vein: Respiratory phasicity is normal and symmetric with the symptomatic side. No evidence of thrombus. Normal compressibility. Common Femoral Vein: Partial compressibility of the common femoral vein. Vascular Doppler flow is present. Saphenofemoral Junction: No evidence of thrombus. Normal compressibility and flow on color Doppler imaging. Profunda Femoral Vein: No evidence of thrombus. Normal compressibility and flow on color  Doppler imaging. Femoral Vein: No evidence of thrombus. Normal compressibility, respiratory phasicity and response to augmentation. Popliteal Vein: No evidence of thrombus. Normal compressibility, respiratory phasicity and response to augmentation. Calf Veins: No evidence of thrombus. Normal compressibility and flow on color Doppler imaging. Superficial Great Saphenous Vein: No evidence of thrombus. Normal compressibility. Other Findings:  None. IMPRESSION: Partial compressibility of the left common femoral vein, worrisome for a nonocclusive thrombus. Electronically Signed   By: Rogelia Myers M.D.   On: 06/03/2024 16:20    Orders Placed This Encounter  Procedures   CBC with Differential (Cancer Center Only)    Standing Status:   Future    Expiration Date:   06/14/2025   Reticulocytes    Standing Status:   Future    Expiration Date:   06/14/2025    Future Appointments  Date Time Provider Department Center  07/15/2024  1:45 PM Teresa Dorsey BRAVO, [facility] H[facility]    I spent a total of 42 minutes during this encounter with the patient including review of chart and various tests results, discussions about plan of care and coordination of care plan.  This document was completed utilizing speech recognition software. Grammatical errors, random word insertions, pronoun errors, and incomplete sentences are an occasional consequence of this system due to software limitations, ambient noise, and hardware issues. Any formal questions or concerns about the content, text or information contained within the body of this dictation should be directly addressed to the provider for clarification.

## 2024-06-15 ENCOUNTER — Encounter: Payer: Self-pay | Admitting: Pulmonary Disease

## 2024-06-15 ENCOUNTER — Other Ambulatory Visit (HOSPITAL_COMMUNITY): Payer: Self-pay

## 2024-06-15 ENCOUNTER — Ambulatory Visit: Admitting: Pulmonary Disease

## 2024-06-15 ENCOUNTER — Other Ambulatory Visit: Payer: Self-pay

## 2024-06-15 VITALS — BP 142/78 | HR 95 | Ht 65.0 in | Wt 209.0 lb

## 2024-06-15 DIAGNOSIS — I82409 Acute embolism and thrombosis of unspecified deep veins of unspecified lower extremity: Secondary | ICD-10-CM

## 2024-06-15 DIAGNOSIS — R059 Cough, unspecified: Secondary | ICD-10-CM

## 2024-06-15 DIAGNOSIS — J45909 Unspecified asthma, uncomplicated: Secondary | ICD-10-CM

## 2024-06-15 DIAGNOSIS — N189 Chronic kidney disease, unspecified: Secondary | ICD-10-CM

## 2024-06-15 DIAGNOSIS — D649 Anemia, unspecified: Secondary | ICD-10-CM

## 2024-06-15 LAB — HAPTOGLOBIN: Haptoglobin: 300 mg/dL (ref 37–355)

## 2024-06-15 MED ORDER — BUDESONIDE-FORMOTEROL FUMARATE 160-4.5 MCG/ACT IN AERO
2.0000 | INHALATION_SPRAY | Freq: Two times a day (BID) | RESPIRATORY_TRACT | 12 refills | Status: AC
  Filled 2024-06-15: qty 10.2, 30d supply, fill #0

## 2024-06-15 MED ORDER — AZITHROMYCIN 250 MG PO TABS
ORAL_TABLET | ORAL | 0 refills | Status: DC
  Filled 2024-06-15: qty 6, 5d supply, fill #0

## 2024-06-15 NOTE — Discharge Summary (Signed)
 Physician Discharge Summary   Patient: Evelyn Baker MRN: [id number] DOB: 10-13-1960  Admit date:     06/03/2024  Discharge date: 06/13/2024  Discharge Physician: Alejandro Marker, DO   PCP: Katina Pfeiffer, PA-C   Recommendations at discharge:   Follow-up with PCP within 1 to 2 weeks repeat CBC, CMP, mag, Phos within 1 week Follow-up with Rheumatology in outpatient setting within 1 to 2 weeks Follow-up with Gastroenterology outpatient setting for evaluation with EGD and colonoscopy Follow-up with Nephrology in outpatient setting an appointment is scheduled for early January Follow-up with Vascular Surgery in outpatient setting within 1 to 2 weeks Follow-up with Cardiology outpatient setting within 1 to 2 weeks Follow-up with hematology oncology within 1 week; has an appointment with Dr. Autumn on Tuesday and will defer to him to resume anticoagulation based on her hemoglobin  Discharge Diagnoses: Principal Problem:   Acute deep vein thrombosis (DVT) of left femoral vein (HCC) Active Problems:   AKI (acute kidney injury)   Microcytic anemia   Mixed connective tissue disease   Hypokalemia   Hypertension   Hypothyroidism   Hyperlipidemia   Asthma   History of CVA (cerebrovascular accident)   Refusal of blood transfusion for reasons of conscience  Resolved Problems:   * No resolved hospital problems. *  Hospital Course: ALYCE INSCORE is a 63 y.o. 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 Hgb Trended down but no active Sx of bleeding. Started IV Iron  and sq EPO. Hematology consulted for further evaluation and recommendations. GI Consulted given drop in Hgb and deferring endoscopic evaluation until Hgb is >7. Hgb Low but fairly stable. She completed 5 days of EPO and she is asymptomatic and hemoglobin improved and stabilized.  She will need to  follow-up with PCP as well as hematology in outpatient setting within 1 week. She will also need to follow-up with Gastroenterology, Nephrology and Vascular Surgery in outpatient setting  Assessment and Plan:  Acute Left, Femoral Vein DVT: Patient denies any prior history of DVT, no recent surgeries.  Patient stated had a 4-hour car ride recently. -Patient noted the morning of 06/04/2024 with complaints of shortness of breath per RN.   -Patient denies any further significant shortness of breath.  -VQ scan done and showed that the pulmonary perfusion is within normal limits. There are no wedge-shaped perfusion defects to suggest acute pulmonary embolism.. -Patient was on IV heparin  however heparin  levels could not be obtained this morning as pediatric tubes were not available.  As patient states Jehovah's Witness was concerned about her hemoglobin levels. Discontinued IV heparin  and placed on full dose Lovenox  and subsequently transitioned to Apixaban  on 06/07/24 but started holding 12/12 and will continue to hold at discharge and defer to hematology to resume; will need further evaluation by vascular surgery in outpatient setting within 1 to 2 weeks as well -Keep left lower extremity elevated.   -Continue scheduled Acetaminophen  1000 mg po TID for pain control.   -Hematology consulted and she will need to see them in outpatient setting for Hypercoaguable workup and appointment is scheduled for 12/16   AKI on CKD Stage 3a / Metabolic Acidosis: Patient noted to have a creatinine of 1.80 on admission compared to previous baseline of approximately 1.0-1.1. Per chart review patient with a creatinine 1.07 on 04/15/2024. Etiology of AKI likely multifactorial secondary to prerenal azotemia due to decreased  oral intake in the setting of ARB. Urinalysis with 100 protein, small leukocytes, nitrite negative, rare bacteria, 11-20 WBCs. Urine sodium at 59, urine creatinine at 83. BUN/Cr Trend: Recent Labs  Lab  06/07/24 0536 06/08/24 0533 06/09/24 0554 06/10/24 0531 06/11/24 0800 06/12/24 0722 06/13/24 0430  BUN 31* 27* 23 26* 24* 25* 19  CREATININE 1.29* 1.18* 1.34* 1.43* 1.48* 1.34* 1.26*  -Had a slight metabolic acidosis with a CO2 of 21, anion gap of 11, chloride level of 96 -Continue to hold ARB and likely will not resume on discharge. Saline lock IV fluids and give a Dose of IV Lasix  20 mg x1 again prior to discharge -Avoid Nephrotoxic Medications and stop Chlorthalidone , Contrast Dyes, Hypotension and Dehydration to Ensure Adequate Renal Perfusion and will need to Renally Adjust Meds. CTM and Trend Renal Function carefully and repeat CMP in the AM  -Will need outpt F/U w/ Nephrology   Acute Microcytic Anemia:  Hemoglobin noted to 8.3 on admission with prior hemoglobin of 10.0 on 03/20/2024 and prior to that 8.5 on 03/15/2024 but back in April of this year was 12.1. Hgb/Hct continues to Trend down slowly but Patient with no overt bleeding or symptoms as current trend shows:  Recent Labs  Lab 06/08/24 0533 06/09/24 0554 06/10/24 0531 06/11/24 0800 06/12/24 0722 06/13/24 0430 06/14/24 0850  HGB 6.8* 6.8* 6.6* 6.7* 6.4* 7.0* 7.8*  HCT 19.4* 19.7* 18.0* 18.5* 17.6* 19.1* 21.5*  MCV 75.2* 76.1* 73.2* 73.7* 74.6* 74.3* 74.4*  -FOBT negative. Anemia panel with iron  level of 23, TIBC of 165, ferritin of 473, folate of 7.5. Hemoglobin currently at 6.8 today, partly dilutional. Patient is a [employer] and as such does not accept blood products.  -Patient agreeable to IV iron  so gave her a dose of Iron  Sucrose 200 mg x1 and Epoetin  Alfa-epbx 5,000 units on 12/10. On 12/11 gave her a dose of Iron  Sucrose 100 mg x1, IV Folic Acid  1 mg, and Epoetin  Alfa-epbx 20,000 units x1 and this was repeated 12/12, 12/13 and being repeated 12/14 but will increase Epotein Alfa to 40,000 units sq at the recommendation of Oncology; she received another dose of Epotein Alpha 40,000 units on 1215. -Hematology  consulted for further evaluation and in agreement with current management. Patient to follow up with Hematology as an outpatient eventually and has appointment 06/14/24 -GI also consulted and they had a detailed discussion with the patient's family and patient herself and feels that she cannot undergo sedation for endoscopic evaluation at this time given that her hemoglobin is less than 7. GI has no new Recc's and have signed off the case and will follow in the outpatient setting. -Repeat CT scan of the abdomen pelvis without contrast has been done and it showed No acute abdominopelvic abnormality. No evidence of retroperitoneal hematoma but did show Findings of volume overload with trace right and small left pleural effusions, diffuse body wall edema, trace presacral edema, and pericardial effusion. Aortic Atherosclerosis -Given CT Findings will give IV Lasix  20 mg again. Given 12/12, 12/13 and will give again today prior to discharge.    Hypokalemia: K+ is now 3.6. CTM and Replete as Necessary.   HypoNatremia: Likely hypervolemic hyponatremia; Na+ Trend:  Recent Labs  Lab 06/07/24 0536 06/08/24 0533 06/09/24 0554 06/10/24 0531 06/11/24 0800 06/12/24 0722 06/13/24 0430  NA 132* 134* 131* 129* 129* 130* 132*  -Stop Chlorthalidone  and Give her a dose of low dose Lasix  20 mg x1 again today. CTM and Trend and Repeat  CMP within 1 week   Essential Hypertension: ARB on hold secondary to AKI. Continue BiDil  20-37.5 mg per tab 2 tabs 3 times daily. C/w Amlodipine  10 mg po Daily and Chlorthalidone  25 mg po Daily. CTM BP per Protocol. Last BP reading was 135/84. C/w Carvedilol  6.25 mg po BID. Add IV Labetalol  10 mg q2hprn for SBP >180 or DBP >100. Outpatient follow-up with Cardiology.   History of CVA: Plavix  on hold as patient currently on full dose anticoagulation. Continue Rosuvastatin  10 mg po Daily.   Nausea and vomiting: Mild and resolved.  Continue with antiemetics w/ Ondansetron  4 mg po/IV  q6hprn   Hypothyroidism: C/w Levothyroxine  112 mcg po Daily     Hyperlipidemia: C/w Rosuvastatin  10 mg po Daily    Mixed connective tissue disease/osteoarthritis -Patient being followed by rheumatology, Dr. Dolphus and managed on Azathioprine  100g mg po daily in the outpatient setting. Patient states Imuran  held recently while on antibiotics but Resumed Imuran . Will need Outpatient follow-up with Rheumatology.   Asthma: Stable C/w Symbicort  substitution with Fluticasone  Furoate-Vilanterol 1 puff IH Daily and Albuterol  as needed.   Hypoalbuminemia: Patient's Albumin  Lvl is now 3.2. CTM & Trend & repeat CMP int he AM  Class II Obesity: Complicates overall prognosis and care. Estimated body mass index is 34.55 kg/m as calculated from the following:   Height as of this encounter: 5' 5 (1.651 m).   Weight as of this encounter: 94.2 kg. Weight Loss and Dietary Counseling given  Consultants: GI, Medical Oncology  Procedures performed: As delineated as above   Disposition: Home Diet recommendation:  Discharge Diet Orders (From admission, onward)     Start     Ordered   06/13/24 0000  Diet - low sodium heart healthy        06/13/24 0822   06/13/24 0000  Diet - low sodium heart healthy        06/13/24 0824           Cardiac and Carb modified diet DISCHARGE MEDICATION: Allergies as of 06/13/2024       Reactions   Shellfish Allergy Anaphylaxis, Swelling   Avocado Swelling   Lips   Codeine  Other (See Comments)   Constipation   Iodinated Contrast Media Other (See Comments)   Pineapple Swelling   Lip swelling    Penicillins Rash   Reaction: Childhood        Medication List     STOP taking these medications    chlorthalidone  25 MG tablet Commonly known as: HYGROTON    clopidogrel  75 MG tablet Commonly known as: Plavix    doxycycline  100 MG capsule Commonly known as: VIBRAMYCIN    doxycycline  100 MG tablet Commonly known as: VIBRA -TABS   ferrous sulfate  325 (65  FE) MG EC tablet Replaced by: FeroSul 325 (65 Fe) MG tablet   promethazine -dextromethorphan  6.25-15 MG/5ML syrup Commonly known as: PROMETHAZINE -DM   valsartan  160 MG tablet Commonly known as: Diovan        TAKE these medications    Acetaminophen  Extra Strength 500 MG Tabs Take 2 tablets (1,000 mg total) by mouth every 8 (eight) hours as needed.   albuterol  108 (90 Base) MCG/ACT inhaler Commonly known as: VENTOLIN  HFA Inhale 2 puffs into the lungs every 4 (four) hours as needed for wheezing or shortness of breath.   amLODipine  10 MG tablet Commonly known as: NORVASC  Take 1 tablet (10 mg total) by mouth daily.   azathioprine  100 MG tablet Commonly known as: IMURAN  Take 1 tablet (100 mg total) by  mouth daily as directed   B-12 1000 MCG Tabs Take 1 tablet (1,000 mcg total) by mouth daily.   BiDil  20-37.5 MG tablet Generic drug: isosorbide -hydrALAZINE  Take 1 tablet by mouth 3 (three) times daily.   carvedilol  6.25 MG tablet Commonly known as: COREG  Take 1 tablet (6.25 mg total) by mouth 2 (two) times daily with a meal.   EPINEPHrine  0.3 mg/0.3 mL Soaj injection Commonly known as: EPI-PEN Inject 0.3 mg into the muscle as needed as directed. What changed: reasons to take this   FeroSul 325 (65 Fe) MG tablet Generic drug: ferrous sulfate  Take 1 tablet (325 mg total) by mouth 3 (three) times daily with meals. Replaces: ferrous sulfate  325 (65 FE) MG EC tablet   hydrALAZINE  25 MG tablet Commonly known as: APRESOLINE  Take 1 tablet (25 mg) once daily as needed for systolic blood pressure > 170 What changed: when to take this   levothyroxine  112 MCG tablet Commonly known as: SYNTHROID  Take 1 tablet (112 mcg total) by mouth in the morning on an empty stomach.   ondansetron  4 MG tablet Commonly known as: ZOFRAN  Take 1 tablet (4 mg total) by mouth every 6 (six) hours as needed for nausea.   pantoprazole  40 MG tablet Commonly known as: PROTONIX  Take 1 tablet (40 mg  total) by mouth 2 (two) times daily.   potassium chloride  10 MEQ CR capsule Commonly known as: MICRO-K  Take 1 capsule (10 mEq total) by mouth 2 (two) times daily.   rosuvastatin  10 MG tablet Commonly known as: CRESTOR  Take 1 tablet (10 mg total) by mouth daily.   senna-docusate 8.6-50 MG tablet Commonly known as: Senokot-S Take 1 tablet by mouth at bedtime as needed for mild constipation.   Symbicort  80-4.5 MCG/ACT inhaler Generic drug: budesonide -formoterol  Inhale 2 puffs into the lungs in the morning and at bedtime. What changed:  when to take this reasons to take this        Follow-up Information     Katina Pfeiffer, PA-C. Schedule an appointment as soon as possible for a visit .   Specialty: Family Medicine Contact information: [address] [place] [address] [phone]         Elicia Claw, MD. Call.   Specialty: Gastroenterology Why: Call for follow up for Endoscopy adn Colonoscopy Contact information: [address] [place] [address] [phone]         Autumn Millman, MD. Go in 1 day(s).   Specialty: Oncology Why: Follow up scheduled for 06/14/24 at 9:30 AM for hospital Follow up Contact information: [address] [place] [address] [phone]         Serene Gaile ORN, MD. Call.   Specialties: Vascular Surgery, Cardiology Why: Follow up for Leg DVT Contact information: [address] [place] [address] [phone]         Dolphus Reiter, MD. Call.   Specialty: Rheumatology Why: Follow up for Hospital Follow Up Contact information: [address] [place] [address] [place] [address] [phone]         [place], [place]. Call.   Why: Call for Hospital Follow up within 1-2 weeks; Has appointment in early January Contact information: [address] [place] [address] [phone]                Discharge Exam: Fredricka Weights   06/11/24 0500  06/12/24 0504 06/13/24 0521  Weight: 96 kg 95.5 kg 94.2 kg   Vitals:   06/13/24 0617 06/13/24 9166  BP: (!) 172/70 (!) 166/86  Pulse: 93 93  Resp:    Temp:    SpO2:  95%   Examination: Physical Exam:  Constitutional: WN/WD obese AAF in NAD Respiratory: Diminished to auscultation bilaterally, no wheezing, rales, rhonchi or crackles. Normal respiratory effort and patient is not tachypenic. No accessory muscle use. Unlabored breathing  Cardiovascular: RRR, no murmurs / rubs / gallops. S1 and S2 auscultated. 1+ LE Abdomen: Soft, non-tender, Distended 2/2 body habitus. Bowel sounds positive.  GU: Deferred. Musculoskeletal: No clubbing / cyanosis of digits/nails. Left Leg swollen compared to the Right Skin: No rashes, lesions, ulcers on a limited skin evaluation. No induration; Warm and dry.  Neurologic: CN 2-12 grossly intact with no focal deficits. Romberg sign and cerebellar reflexes not assessed.  Psychiatric: Normal judgment and insight. Alert and oriented x 3. Normal mood and appropriate affect.   Condition at discharge: stable  The results of significant diagnostics from this hospitalization (including imaging, microbiology, ancillary and laboratory) are listed below for reference.   Imaging Studies: CT ABDOMEN PELVIS WO CONTRAST Result Date: 06/10/2024 CLINICAL DATA:  Anemia.  Concern for retroperitoneal hematoma. EXAM: CT ABDOMEN AND PELVIS WITHOUT CONTRAST TECHNIQUE: Multidetector CT imaging of the abdomen and pelvis was performed following the standard protocol without IV contrast. RADIATION DOSE REDUCTION: This exam was performed according to the departmental dose-optimization program which includes automated exposure control, adjustment of the mA and/or kV according to patient size and/or use of iterative reconstruction technique. COMPARISON:  CT abdomen and pelvis dated 03/20/2024 FINDINGS: Lower chest: Bilateral lower lobe relaxation atelectasis. Trace right and small left  pleural effusions. Partially imaged heart size is normal. Trace pericardial effusion. Hepatobiliary: No focal hepatic lesions. No intra or extrahepatic biliary ductal dilation. Normal gallbladder. Pancreas: No focal lesions or main ductal dilation. Spleen: Normal in size without focal abnormality. Adrenals/Urinary Tract: No adrenal nodules. No suspicious renal mass on this noncontrast enhanced examination, calculi or hydronephrosis. No focal bladder wall thickening. Stomach/Bowel: Small hiatal hernia. Normal appearance of the stomach. No evidence of bowel wall thickening, distention, or inflammatory changes. Small and large bowel are diffusely underdistended. Normal appendix. Vascular/Lymphatic: Aortic atherosclerosis. Superficial varicose veins anterior to the pubis. No enlarged abdominal or pelvic lymph nodes. Reproductive: No adnexal masses. Other: Trace presacral free fluid.  No free air or fluid collection. Musculoskeletal: No acute or abnormal lytic or blastic osseous lesions. Diffuse body wall edema. IMPRESSION: 1. No acute abdominopelvic abnormality. No evidence of retroperitoneal hematoma. 2. Findings of volume overload with trace right and small left pleural effusions, diffuse body wall edema, trace presacral edema, and pericardial effusion. 3.  Aortic Atherosclerosis (ICD10-I70.0). Electronically Signed   By: Limin  Xu M.D.   On: 06/10/2024 16:05   NM Pulmonary Perfusion Result Date: 06/06/2024 CLINICAL DATA:  Shortness of breath on minimal exertion. Deep venous thrombosis. EXAM: NUCLEAR MEDICINE PERFUSION LUNG SCAN TECHNIQUE: Perfusion images were obtained in multiple projections after intravenous injection of radiopharmaceutical. No ventilation imaging obtained. RADIOPHARMACEUTICALS:  4.4 mCi Tc-27m MAA IV COMPARISON:  Chest radiographs 06/05/2024.  Chest CT 03/22/2024 FINDINGS: The pulmonary perfusion is within normal limits. There are no wedge-shaped perfusion defects to suggest acute pulmonary  embolism. IMPRESSION: No evidence of acute pulmonary embolism on perfusion scintigraphy by PISAPED criteria. Electronically Signed   By: Elsie Perone M.D.   On: 06/06/2024 15:36   DG CHEST PORT 1 VIEW Result Date: 06/05/2024 EXAM: 1 VIEW(S) XRAY OF THE CHEST 06/05/2024 08:56:00 PM COMPARISON: 06/04/2024 CLINICAL HISTORY: Respiratory abnormality FINDINGS: LUNGS  AND PLEURA: Mild pulmonary edema. Mild bilateral interstitial prominence. Mild bilateral peribronchial cuffing. No pleural effusion. No pneumothorax. HEART AND MEDIASTINUM: Borderline cardiomegaly. No acute abnormality of the mediastinal silhouette. BONES AND SOFT TISSUES: No acute osseous abnormality. IMPRESSION: 1. Mild pulmonary edema. Electronically signed by: Morgane Naveau MD 06/05/2024 11:15 PM EST RP Workstation: [id number]   US  RENAL Result Date: 06/04/2024 EXAM: US  Retroperitoneum Complete, Renal. 06/04/2024 05:56:29 PM TECHNIQUE: Real-time ultrasonography of the retroperitoneum renal was performed. COMPARISON: None available CLINICAL HISTORY: Acute renal failure. FINDINGS: FINDINGS: RIGHT KIDNEY/URETER: Right kidney measures 12.5 x 4.9 x 5.7 cm. Mildly increased echotexture. No hydronephrosis. No calculus. No mass. LEFT KIDNEY/URETER: Left kidney measures 11.5 x 5.4 x 4.5 cm. Mildly increased echotexture. No hydronephrosis. No calculus. No mass. BLADDER: Unremarkable appearance of the bladder. IMPRESSION: 1. Mildly increased renal cortical echotexture bilaterally, which can be seen with medical renal disease or acute kidney injury. Electronically signed by: Franky Crease MD 06/04/2024 07:41 PM EST RP Workstation: [id number]   DG Chest Port 1 View Result Date: 06/04/2024 EXAM: 1 VIEW(S) XRAY OF THE CHEST 06/04/2024 09:11:00 AM COMPARISON: 03/20/2024 CLINICAL HISTORY: Shortness of breath. FINDINGS: LUNGS AND PLEURA: Airway thickening suggests bronchitis or reactive airways disease. Mild upper zone pulmonary vascular prominence, cannot  exclude pulmonary venous hypertension. No focal pulmonary opacity. No pleural effusion. No pneumothorax. HEART AND MEDIASTINUM: Mild cardiomegaly. No acute abnormality of the mediastinal silhouette. BONES AND SOFT TISSUES: No acute osseous abnormality. IMPRESSION: 1. Airway thickening suggestive of bronchitis or reactive airways disease. 2. Mild upper zone pulmonary vascular prominence, cannot exclude pulmonary venous hypertension. 3. Mild cardiomegaly. Electronically signed by: Ryan Salvage MD 06/04/2024 01:17 PM EST RP Workstation: [id number]   US  Venous Img Lower  Left (DVT Study) Result Date: 06/03/2024 CLINICAL DATA:  left leg edema EXAM: LEFT LOWER EXTREMITY VENOUS DOPPLER ULTRASOUND TECHNIQUE: Gray-scale sonography with graded compression, as well as color Doppler and duplex ultrasound were performed to evaluate the lower extremity deep venous systems from the level of the common femoral vein and including the common femoral, femoral, profunda femoral, popliteal and calf veins including the posterior tibial, peroneal and gastrocnemius veins when visible. The superficial great saphenous vein was also interrogated. Spectral Doppler was utilized to evaluate flow at rest and with distal augmentation maneuvers in the common femoral, femoral and popliteal veins. COMPARISON:  None Available. FINDINGS: Contralateral Common Femoral Vein: Respiratory phasicity is normal and symmetric with the symptomatic side. No evidence of thrombus. Normal compressibility. Common Femoral Vein: Partial compressibility of the common femoral vein. Vascular Doppler flow is present. Saphenofemoral Junction: No evidence of thrombus. Normal compressibility and flow on color Doppler imaging. Profunda Femoral Vein: No evidence of thrombus. Normal compressibility and flow on color Doppler imaging. Femoral Vein: No evidence of thrombus. Normal compressibility, respiratory phasicity and response to augmentation. Popliteal Vein: No  evidence of thrombus. Normal compressibility, respiratory phasicity and response to augmentation. Calf Veins: No evidence of thrombus. Normal compressibility and flow on color Doppler imaging. Superficial Great Saphenous Vein: No evidence of thrombus. Normal compressibility. Other Findings:  None. IMPRESSION: Partial compressibility of the left common femoral vein, worrisome for a nonocclusive thrombus. Electronically Signed   By: Rogelia Myers M.D.   On: 06/03/2024 16:20   Microbiology: Results for orders placed or performed during the hospital encounter of 06/03/24  Urine Culture (for pregnant, neutropenic or urologic patients or patients with an indwelling urinary catheter)     Status: None   Collection Time: 06/04/24  1:45 PM   Specimen:  Urine, Clean Catch  Result Value Ref Range Status   Specimen Description   Final    URINE, CLEAN CATCH Performed at [facility], [address] [place], [place] [address]    Special Requests   Final    NONE Performed at [facility], [address], [place], [place] [address]    Culture   Final    NO GROWTH Performed at [facility], [address] [place], [place] [address]    Report Status 06/05/2024 FINAL  Final   Labs: CBC: Recent Labs  Lab 06/10/24 0531 06/11/24 0800 06/12/24 0722 06/13/24 0430 06/14/24 0850  WBC 5.4 5.3 5.0 6.6 6.4  NEUTROABS 4.0 3.8 3.8 5.0 4.8  HGB 6.6* 6.7* 6.4* 7.0* 7.8*  HCT 18.0* 18.5* 17.6* 19.1* 21.5*  MCV 73.2* 73.7* 74.6* 74.3* 74.4*  PLT 208 219 225 267 279   Basic Metabolic Panel: Recent Labs  Lab 06/09/24 0554 06/10/24 0531 06/11/24 0800 06/12/24 0722 06/13/24 0430  NA 131* 129* 129* 130* 132*  K 3.9 3.7 3.4* 3.9 3.6  CL 99 97* 96* 99 98  CO2 20* 22 21* 22 25  GLUCOSE 86 103* 122* 89 94  BUN 23 26* 24* 25* 19  CREATININE 1.34* 1.43* 1.48* 1.34* 1.26*  CALCIUM  8.7* 8.6* 8.6* 8.5* 8.8*  MG 2.0 2.0 2.0 2.0 1.8  PHOS 3.3 3.3 3.6 3.3 2.9    Liver Function Tests: Recent Labs  Lab 06/09/24 0554 06/10/24 0531 06/11/24 0800 06/12/24 0722 06/13/24 0430  AST 36 53* 41 40 43*  ALT 17 32 28 34 32  ALKPHOS 128* 182* 168* 144* 144*  BILITOT 0.4 0.3 0.4 0.3 0.4  PROT 6.2* 6.0* 6.4* 6.2* 6.6  ALBUMIN  2.8* 2.9* 3.0* 2.9* 3.2*   CBG: No results for input(s): GLUCAP in the last 168 hours.  Discharge time spent: greater than 30 minutes.  Signed: Alejandro Marker, DO [facility] 06/15/2024

## 2024-06-15 NOTE — Progress Notes (Signed)
 @Patient  ID: Evelyn Baker, female    DOB: Feb 08, 1961, 63 y.o.   MRN: [id number]  Chief Complaint  Patient presents with   Medical Management of Chronic Issues    Pt states cough that keep her up at night     Referring provider: Katina Pfeiffer, PA-C  HPI:   63 y.o. woman whom we are seeing in hospital follow-up with anemia, new DVT.  Multiple different hospitalization reviewed.  Multiple prior pulmonary notes from clinic reviewed.  Admitted to hospital fatigue shortness of breath.  Hemoglobin to be in the sixes.  Suspect there is fluid overload.  Got IV Lasix .  Developed AKI.  On CKD.  Chlorthalidone  was held on discharge.  Creatinine was improving at time of discharge.  Chest imaging consistent with pulmonary edema on my review.  Hemoglobin eventually improved to the sevens.  She refuses blood products.  She has had increased congestion since Monday.  Discharged on Monday.  Cough congestion.  Worse from baseline.  In the hospital she was diagnosed with a DVT.  She continues Eliquis .  Questionaires / Pulmonary Flowsheets:   ACT:      No data to display          MMRC:     No data to display          Epworth:      No data to display          Tests:   FENO:  No results found for: NITRICOXIDE  PFT:    Latest Ref Rng & Units 12/21/2023   11:15 AM  PFT Results  FVC-Pre L 2.13   FVC-Predicted Pre % 62   FVC-Post L 2.08   FVC-Predicted Post % 60   Pre FEV1/FVC % % 77   Post FEV1/FCV % % 80   FEV1-Pre L 1.63   FEV1-Predicted Pre % 61   FEV1-Post L 1.66   DLCO uncorrected ml/min/mmHg 16.63   DLCO UNC% % 79   DLCO corrected ml/min/mmHg 16.62   DLCO COR %Predicted % 79   DLVA Predicted % 124   TLC L 4.43   TLC % Predicted % 85   RV % Predicted % 107   Spirometry 11/2023 reviewed interpreted as no obstruction, suggestive of air trapping versus restriction, lung volumes within normal notes.  DLCO within normal limits.  WALK:      No data to display           Imaging: Personally reviewed and as per EMR and discussion with CT ABDOMEN PELVIS WO CONTRAST Result Date: 06/10/2024 CLINICAL DATA:  Anemia.  Concern for retroperitoneal hematoma. EXAM: CT ABDOMEN AND PELVIS WITHOUT CONTRAST TECHNIQUE: Multidetector CT imaging of the abdomen and pelvis was performed following the standard protocol without IV contrast. RADIATION DOSE REDUCTION: This exam was performed according to the departmental dose-optimization program which includes automated exposure control, adjustment of the mA and/or kV according to patient size and/or use of iterative reconstruction technique. COMPARISON:  CT abdomen and pelvis dated 03/20/2024 FINDINGS: Lower chest: Bilateral lower lobe relaxation atelectasis. Trace right and small left pleural effusions. Partially imaged heart size is normal. Trace pericardial effusion. Hepatobiliary: No focal hepatic lesions. No intra or extrahepatic biliary ductal dilation. Normal gallbladder. Pancreas: No focal lesions or main ductal dilation. Spleen: Normal in size without focal abnormality. Adrenals/Urinary Tract: No adrenal nodules. No suspicious renal mass on this noncontrast enhanced examination, calculi or hydronephrosis. No focal bladder wall thickening. Stomach/Bowel: Small hiatal hernia. Normal appearance of the stomach.  No evidence of bowel wall thickening, distention, or inflammatory changes. Small and large bowel are diffusely underdistended. Normal appendix. Vascular/Lymphatic: Aortic atherosclerosis. Superficial varicose veins anterior to the pubis. No enlarged abdominal or pelvic lymph nodes. Reproductive: No adnexal masses. Other: Trace presacral free fluid.  No free air or fluid collection. Musculoskeletal: No acute or abnormal lytic or blastic osseous lesions. Diffuse body wall edema. IMPRESSION: 1. No acute abdominopelvic abnormality. No evidence of retroperitoneal hematoma. 2. Findings of volume overload with trace right and  small left pleural effusions, diffuse body wall edema, trace presacral edema, and pericardial effusion. 3.  Aortic Atherosclerosis (ICD10-I70.0). Electronically Signed   By: Limin  Xu M.D.   On: 06/10/2024 16:05   NM Pulmonary Perfusion Result Date: 06/06/2024 CLINICAL DATA:  Shortness of breath on minimal exertion. Deep venous thrombosis. EXAM: NUCLEAR MEDICINE PERFUSION LUNG SCAN TECHNIQUE: Perfusion images were obtained in multiple projections after intravenous injection of radiopharmaceutical. No ventilation imaging obtained. RADIOPHARMACEUTICALS:  4.4 mCi Tc-49m MAA IV COMPARISON:  Chest radiographs 06/05/2024.  Chest CT 03/22/2024 FINDINGS: The pulmonary perfusion is within normal limits. There are no wedge-shaped perfusion defects to suggest acute pulmonary embolism. IMPRESSION: No evidence of acute pulmonary embolism on perfusion scintigraphy by PISAPED criteria. Electronically Signed   By: Elsie Perone M.D.   On: 06/06/2024 15:36   DG CHEST PORT 1 VIEW Result Date: 06/05/2024 EXAM: 1 VIEW(S) XRAY OF THE CHEST 06/05/2024 08:56:00 PM COMPARISON: 06/04/2024 CLINICAL HISTORY: Respiratory abnormality FINDINGS: LUNGS AND PLEURA: Mild pulmonary edema. Mild bilateral interstitial prominence. Mild bilateral peribronchial cuffing. No pleural effusion. No pneumothorax. HEART AND MEDIASTINUM: Borderline cardiomegaly. No acute abnormality of the mediastinal silhouette. BONES AND SOFT TISSUES: No acute osseous abnormality. IMPRESSION: 1. Mild pulmonary edema. Electronically signed by: Morgane Naveau MD 06/05/2024 11:15 PM EST RP Workstation: [id number]   US  RENAL Result Date: 06/04/2024 EXAM: US  Retroperitoneum Complete, Renal. 06/04/2024 05:56:29 PM TECHNIQUE: Real-time ultrasonography of the retroperitoneum renal was performed. COMPARISON: None available CLINICAL HISTORY: Acute renal failure. FINDINGS: FINDINGS: RIGHT KIDNEY/URETER: Right kidney measures 12.5 x 4.9 x 5.7 cm. Mildly increased echotexture. No  hydronephrosis. No calculus. No mass. LEFT KIDNEY/URETER: Left kidney measures 11.5 x 5.4 x 4.5 cm. Mildly increased echotexture. No hydronephrosis. No calculus. No mass. BLADDER: Unremarkable appearance of the bladder. IMPRESSION: 1. Mildly increased renal cortical echotexture bilaterally, which can be seen with medical renal disease or acute kidney injury. Electronically signed by: Franky Crease MD 06/04/2024 07:41 PM EST RP Workstation: [id number]   DG Chest Port 1 View Result Date: 06/04/2024 EXAM: 1 VIEW(S) XRAY OF THE CHEST 06/04/2024 09:11:00 AM COMPARISON: 03/20/2024 CLINICAL HISTORY: Shortness of breath. FINDINGS: LUNGS AND PLEURA: Airway thickening suggests bronchitis or reactive airways disease. Mild upper zone pulmonary vascular prominence, cannot exclude pulmonary venous hypertension. No focal pulmonary opacity. No pleural effusion. No pneumothorax. HEART AND MEDIASTINUM: Mild cardiomegaly. No acute abnormality of the mediastinal silhouette. BONES AND SOFT TISSUES: No acute osseous abnormality. IMPRESSION: 1. Airway thickening suggestive of bronchitis or reactive airways disease. 2. Mild upper zone pulmonary vascular prominence, cannot exclude pulmonary venous hypertension. 3. Mild cardiomegaly. Electronically signed by: Ryan Salvage MD 06/04/2024 01:17 PM EST RP Workstation: [id number]   US  Venous Img Lower  Left (DVT Study) Result Date: 06/03/2024 CLINICAL DATA:  left leg edema EXAM: LEFT LOWER EXTREMITY VENOUS DOPPLER ULTRASOUND TECHNIQUE: Gray-scale sonography with graded compression, as well as color Doppler and duplex ultrasound were performed to evaluate the lower extremity deep venous systems from the level of the common femoral vein and  including the common femoral, femoral, profunda femoral, popliteal and calf veins including the posterior tibial, peroneal and gastrocnemius veins when visible. The superficial great saphenous vein was also interrogated. Spectral Doppler was utilized to  evaluate flow at rest and with distal augmentation maneuvers in the common femoral, femoral and popliteal veins. COMPARISON:  None Available. FINDINGS: Contralateral Common Femoral Vein: Respiratory phasicity is normal and symmetric with the symptomatic side. No evidence of thrombus. Normal compressibility. Common Femoral Vein: Partial compressibility of the common femoral vein. Vascular Doppler flow is present. Saphenofemoral Junction: No evidence of thrombus. Normal compressibility and flow on color Doppler imaging. Profunda Femoral Vein: No evidence of thrombus. Normal compressibility and flow on color Doppler imaging. Femoral Vein: No evidence of thrombus. Normal compressibility, respiratory phasicity and response to augmentation. Popliteal Vein: No evidence of thrombus. Normal compressibility, respiratory phasicity and response to augmentation. Calf Veins: No evidence of thrombus. Normal compressibility and flow on color Doppler imaging. Superficial Great Saphenous Vein: No evidence of thrombus. Normal compressibility. Other Findings:  None. IMPRESSION: Partial compressibility of the left common femoral vein, worrisome for a nonocclusive thrombus. Electronically Signed   By: Rogelia Myers M.D.   On: 06/03/2024 16:20    Lab Results: Personally reviewed CBC    Component Value Date/Time   WBC 6.4 06/14/2024 0850   WBC 6.6 06/13/2024 0430   RBC 2.89 (L) 06/14/2024 0850   HGB 7.8 (L) 06/14/2024 0850   HGB 8.5 (L) 03/15/2024 1317   HCT 21.5 (L) 06/14/2024 0850   HCT 26.4 (L) 03/15/2024 1317   PLT 279 06/14/2024 0850   PLT 253 03/15/2024 1317   MCV 74.4 (L) 06/14/2024 0850   MCV 83 03/15/2024 1317   MCH 27.0 06/14/2024 0850   MCHC 36.3 (H) 06/14/2024 0850   RDW 18.3 (H) 06/14/2024 0850   RDW 16.0 (H) 03/15/2024 1317   LYMPHSABS 0.7 06/14/2024 0850   LYMPHSABS 0.7 03/15/2024 1317   MONOABS 0.4 06/14/2024 0850   EOSABS 0.0 06/14/2024 0850   EOSABS 0.1 03/15/2024 1317   BASOSABS 0.0  06/14/2024 0850   BASOSABS 0.0 03/15/2024 1317    BMET    Component Value Date/Time   NA 132 (L) 06/13/2024 0430   NA 142 04/15/2024 1046   K 3.6 06/13/2024 0430   CL 98 06/13/2024 0430   CO2 25 06/13/2024 0430   GLUCOSE 94 06/13/2024 0430   BUN 19 06/13/2024 0430   BUN 25 04/15/2024 1046   CREATININE 1.26 (H) 06/13/2024 0430   CREATININE 0.88 05/27/2023 0932   CALCIUM  8.8 (L) 06/13/2024 0430   GFRNONAA 48 (L) 06/13/2024 0430    BNP No results found for: BNP  ProBNP No results found for: PROBNP  Specialty Problems       Pulmonary Problems   Asthma    Allergies[1]  Immunization History  Administered Date(s) Administered   Influenza, Seasonal, Injecte, Preservative Fre 04/14/2024   Influenza-Unspecified 03/30/2014, 07/13/2019, 03/01/2023   Pfizer(Comirnaty)Fall Seasonal Vaccine 12 years and older 03/10/2023    Past Medical History:  Diagnosis Date   Asthma    Depression    Dyspnea    Hyperlipidemia    Hypertension    Hypothyroidism    Sciatic nerve disease    Thyroid  disease     Tobacco History: Tobacco Use History[2] Counseling given: Not Answered   Continue to not smoke  Outpatient Encounter Medications as of 06/15/2024  Medication Sig   acetaminophen  (TYLENOL ) 500 MG tablet Take 2 tablets (1,000 mg total) by mouth every  8 (eight) hours as needed.   albuterol  (VENTOLIN  HFA) 108 (90 Base) MCG/ACT inhaler Inhale 2 puffs into the lungs every 4 (four) hours as needed for wheezing or shortness of breath.   amLODipine  (NORVASC ) 10 MG tablet Take 1 tablet (10 mg total) by mouth daily.   azathioprine  (IMURAN ) 100 MG tablet Take 1 tablet (100 mg total) by mouth daily as directed   azithromycin  (ZITHROMAX ) 250 MG tablet Take 2 tablets (500 mg total) by mouth daily for 1 day, THEN 1 tablet (250 mg total) daily for 4 days.   budesonide -formoterol  (SYMBICORT ) 160-4.5 MCG/ACT inhaler Inhale 2 puffs into the lungs 2 (two) times daily.   carvedilol  (COREG )  6.25 MG tablet Take 1 tablet (6.25 mg total) by mouth 2 (two) times daily with a meal.   cyanocobalamin  1000 MCG tablet Take 1 tablet (1,000 mcg total) by mouth daily.   EPINEPHrine  0.3 mg/0.3 mL IJ SOAJ injection Inject 0.3 mg into the muscle as needed as directed.   ferrous sulfate  325 (65 FE) MG tablet Take 1 tablet (325 mg total) by mouth 3 (three) times daily with meals.   hydrALAZINE  (APRESOLINE ) 25 MG tablet Take 1 tablet (25 mg) once daily as needed for systolic blood pressure > 170 (Patient taking differently: Take 25 mg by mouth daily.)   isosorbide -hydrALAZINE  (BIDIL ) 20-37.5 MG tablet Take 1 tablet by mouth 3 (three) times daily.   levothyroxine  (SYNTHROID ) 112 MCG tablet Take 1 tablet (112 mcg total) by mouth in the morning on an empty stomach.   ondansetron  (ZOFRAN ) 4 MG tablet Take 1 tablet (4 mg total) by mouth every 6 (six) hours as needed for nausea.   pantoprazole  (PROTONIX ) 40 MG tablet Take 1 tablet (40 mg total) by mouth 2 (two) times daily.   potassium chloride  (MICRO-K ) 10 MEQ CR capsule Take 1 capsule (10 mEq total) by mouth 2 (two) times daily.   rosuvastatin  (CRESTOR ) 10 MG tablet Take 1 tablet (10 mg total) by mouth daily.   senna-docusate (SENOKOT-S) 8.6-50 MG tablet Take 1 tablet by mouth at bedtime as needed for mild constipation.   [DISCONTINUED] budesonide -formoterol  (SYMBICORT ) 80-4.5 MCG/ACT inhaler Inhale 2 puffs into the lungs in the morning and at bedtime.   apixaban  (ELIQUIS ) 5 MG TABS tablet Take 1 tablet (5 mg total) by mouth 2 (two) times daily.   folic acid  (FOLVITE ) 1 MG tablet Take 1 tablet (1 mg total) by mouth daily.   No facility-administered encounter medications on file as of 06/15/2024.     Review of Systems  Review of Systems  No chest pain exertion.  No orthopnea or PND.  Comprehensive review of systems otherwise negative. Physical Exam  BP (!) 142/78 (BP Location: Right Arm, Cuff Size: Normal)   Pulse 95   Ht 5' 5 (1.651 m) Comment:  per pt  Wt 209 lb (94.8 kg)   SpO2 96%   BMI 34.78 kg/m   Wt Readings from Last 5 Encounters:  06/15/24 209 lb (94.8 kg)  06/14/24 209 lb 14.4 oz (95.2 kg)  06/13/24 207 lb 9.6 oz (94.2 kg)  06/03/24 198 lb (89.8 kg)  04/14/24 192 lb (87.1 kg)    BMI Readings from Last 5 Encounters:  06/15/24 34.78 kg/m  06/14/24 34.93 kg/m  06/13/24 34.55 kg/m  06/03/24 32.95 kg/m  04/14/24 31.95 kg/m     Physical Exam General: Sitting in chair, no distress Eyes: EOMI Neck: No JVP Pulmonary: Clear, no wheeze Lower extremities, edema to the ankles noted.  Assessment & Plan:   Increased cough, congestion: Started late in hospital course.  Where she picked up a virus or a bug in the hospital.  Escalate mid dose Symbicort  to high-dose Symbicort .  Offered prednisone  course but she declined today.  Z-Pak to see if this helps with bronchitis symptoms.  DVT: Continue Eliquis .  Follow-up with hematologist.  Anemia: Chronic issue.  Continue supportive measures.  CKD: With AKI recently.  Holding chlorthalidone  with elevated blood pressures and increase swelling.  Seeing nephrologist in January.  Messaging cardiologist to manage his chlorthalidone  to let them know and for further evaluation.   Return in about 3 months (around 09/13/2024) for f/u Dr. Geronimo or APP.   Donnice JONELLE Beals, MD 06/15/2024   This appointment required 42 minutes of patient care (this includes precharting, chart review, review of results, face-to-face care, etc.).     [1]  Allergies Allergen Reactions   Shellfish Allergy Anaphylaxis and Swelling   Avocado Swelling    Lips   Codeine  Other (See Comments)    Constipation   Iodinated Contrast Media Other (See Comments)   Pineapple Swelling    Lip swelling    Penicillins Rash    Reaction: Childhood  [2]  Social History Tobacco Use  Smoking Status Never   Passive exposure: Never  Smokeless Tobacco Never

## 2024-06-15 NOTE — Patient Instructions (Signed)
 For the cough and congestion, take azithromycin  as prescribed  Also for the cough and congestion, I sent you prescription for increased dose of Symbicort , 2 puffs twice a day every day.  Rinse your mouth out with water after every use.  I will send a message to Dr. Michele to see if you can see them sooner since they stop the chlorthalidone , blood pressure medicine, fluid pill  Return to clinic in 3 months or sooner as needed with Dr. Geronimo or APP

## 2024-06-16 NOTE — Telephone Encounter (Signed)
 Spoke to the patient over the phone.  D/c summary reviewed  [name] states that her home BP can be as high as .  Currently on amlodipine  and BiDil  1 tablet 3 times daily.  Valsartan  and chlorthalidone  have been held since her recent hospitalization. Recommended that she start taking BiDil  2 tablets 3 times daily, please update MAR.  Please refer her to Pharm.D. clinic for further medication titration She has an appointment to see nephrology in January 2026, requested that they take over blood pressure management given her chronic kidney disease.  Patient is also advised to seek medical attention by going to the closest ER if she has worsening shortness of breath or lower extremity swelling.  She verbalizes understanding  Dr. Michele

## 2024-06-17 ENCOUNTER — Other Ambulatory Visit (HOSPITAL_COMMUNITY): Payer: Self-pay

## 2024-06-17 ENCOUNTER — Encounter: Payer: Self-pay | Admitting: Oncology

## 2024-06-17 NOTE — Assessment & Plan Note (Signed)
 Chronic normocytic anemia is improving, with hemoglobin increased from 7.0 to 7.8 g/dL.   No evidence of ongoing hemolysis; additional hemolysis labs are pending.   Marrow response is adequate following a short course of erythropoietin  when she was recently hospitalized.  She also received Venofer , at least 3 doses during recent hospital stay.   She tolerates oral iron , though constipation is managed. No overt blood loss or symptoms of acute anemia.   Energy levels have improved since hospitalization.  - Continued oral iron  three times daily with vitamin C to enhance absorption.  - Advised monitoring for constipation; use Senokot as needed and MiraLAX daily if regular bowel movements are not achieved.  - Discussed reducing iron  to twice daily if constipation becomes severe, with goal to decrease to once daily as tolerated.  - Discontinued erythropoietin  injections.  - Ordered folic acid  prescription.  - Planned to check blood counts at next visit.  - Advised to report any signs of bleeding, including hematochezia, melena, or epistaxis.

## 2024-06-20 ENCOUNTER — Inpatient Hospital Stay

## 2024-06-20 ENCOUNTER — Encounter: Payer: Self-pay | Admitting: Oncology

## 2024-06-20 ENCOUNTER — Inpatient Hospital Stay: Admitting: Oncology

## 2024-06-20 VITALS — BP 109/67 | HR 92 | Temp 98.7°F | Resp 14 | Wt 203.7 lb

## 2024-06-20 DIAGNOSIS — I82412 Acute embolism and thrombosis of left femoral vein: Secondary | ICD-10-CM | POA: Diagnosis not present

## 2024-06-20 DIAGNOSIS — D649 Anemia, unspecified: Secondary | ICD-10-CM | POA: Diagnosis not present

## 2024-06-20 LAB — CBC WITH DIFFERENTIAL (CANCER CENTER ONLY)
Abs Immature Granulocytes: 0.08 K/uL — ABNORMAL HIGH (ref 0.00–0.07)
Basophils Absolute: 0 K/uL (ref 0.0–0.1)
Basophils Relative: 0 %
Eosinophils Absolute: 0 K/uL (ref 0.0–0.5)
Eosinophils Relative: 0 %
HCT: 22.2 % — ABNORMAL LOW (ref 36.0–46.0)
Hemoglobin: 8 g/dL — ABNORMAL LOW (ref 12.0–15.0)
Immature Granulocytes: 1 %
Lymphocytes Relative: 4 %
Lymphs Abs: 0.3 K/uL — ABNORMAL LOW (ref 0.7–4.0)
MCH: 26.2 pg (ref 26.0–34.0)
MCHC: 36 g/dL (ref 30.0–36.0)
MCV: 72.8 fL — ABNORMAL LOW (ref 80.0–100.0)
Monocytes Absolute: 0.8 K/uL (ref 0.1–1.0)
Monocytes Relative: 9 %
Neutro Abs: 7.6 K/uL (ref 1.7–7.7)
Neutrophils Relative %: 86 %
Platelet Count: 256 K/uL (ref 150–400)
RBC: 3.05 MIL/uL — ABNORMAL LOW (ref 3.87–5.11)
RDW: 19.2 % — ABNORMAL HIGH (ref 11.5–15.5)
WBC Count: 8.9 K/uL (ref 4.0–10.5)
nRBC: 0.7 % — ABNORMAL HIGH (ref 0.0–0.2)

## 2024-06-20 LAB — RETICULOCYTES
Immature Retic Fract: 8.2 % (ref 2.3–15.9)
RBC.: 4.16 MIL/uL (ref 3.87–5.11)
Retic Count, Absolute: 69.9 K/uL (ref 19.0–186.0)
Retic Ct Pct: 1.7 % (ref 0.4–3.1)

## 2024-06-20 NOTE — Progress Notes (Signed)
 "   [facility]  HEMATOLOGY CLINIC NOTE   PATIENT NAME: Evelyn Baker   MR#: [id number] DOB: [birth year]  DATE OF SERVICE: 06/20/2024   REFERRING PROVIDER  Hospital follow up  Patient Care Team: Katina Pfeiffer, PA-C as PCP - General (Family Medicine) Michele Richardson, DO as PCP - Cardiology (Cardiology)   REASON FOR CONSULTATION/ CHIEF COMPLAINT:  Anemia.  Patient refuses blood products for reasons of conscience   ASSESSMENT & PLAN:  AROUSH Baker is a 63 y.o. 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    Normocytic anemia Chronic normocytic anemia is improving, with hemoglobin increased from 7.0 to 8 g/dL.   No evidence of ongoing hemolysis-LDH, haptoglobin were within normal limits and Coombs test negative on 06/14/2024.  Marrow response is adequate following a short course of erythropoietin  when she was recently hospitalized.  She also received Venofer , at least 3 doses during recent hospital stay.   She tolerates oral iron , though constipation is managed. No overt blood loss or symptoms of acute anemia.   Energy levels have improved since hospitalization.  - Continued oral iron  three times daily with vitamin C to enhance absorption.  - Advised monitoring for constipation; use Senokot as needed and MiraLAX daily if regular bowel movements are not achieved.  - Discussed reducing iron  to twice daily if constipation becomes severe, with goal to decrease to once daily as tolerated.  - In the future, we will consider restarting erythropoietin  injections, as needed.  - Continue folic acid  supplementation  - Planned to check blood counts at next visit.  - Advised to report any signs of bleeding, including hematochezia, melena, or epistaxis.  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 No current symptoms of clot progression or bleeding. Hemoglobin is improving and anticoagulation with Eliquis  was restarted from 06/14/2024. - Instructed to monitor for and report any signs of bleeding, including hematochezia, melena, epistaxis, or unusual bruising. - Persistent lower extremity swelling and weakness are attributed to the thrombus. No symptoms of pulmonary embolism. She tolerates anticoagulation without bleeding. Diuretics are ineffective for thrombotic edema. - Continued apixaban  twice daily for anticoagulation. - Held chlorthalidone  due to hypotension and lack of efficacy for thrombotic edema. - Recommended leg elevation to reduce swelling. - Planned D-dimer testing at next visit to assess ongoing thrombosis. - Scheduled follow-up in two weeks to reassess symptoms and management.  Chronic kidney disease Renal function is fluctuating but currently well-managed, with recent improvement. No acute issues. Antihypertensive and diuretic medications remain held per nephrology recommendations. Nephrology follow-up is scheduled for early January. - Held valsartan  and chlorothiazide until after nephrology follow-up in January. - Planned to recheck kidney function at future visits as needed, but not at next week's visit to limit blood draws. - Advised to maintain hydration and monitor for symptoms of worsening renal function.  I reviewed lab results and outside records for this visit and discussed relevant results with the patient. Diagnosis, plan of care and treatment options were also discussed in detail with the patient. Opportunity provided to ask questions and answers provided to her apparent satisfaction. Provided instructions to call our clinic with any problems, questions or concerns prior to return visit. I recommended to continue follow-up with PCP and sub-specialists. She verbalized  understanding and agreed with the  plan. No barriers to learning was detected.  [name], MD  06/20/2024 2:56 PM  [facility] - A DEPT OF [facility] [id number] [place] [address] Dept: [phone] Dept Fax: [phone]   HISTORY OF PRESENT ILLNESS:  Discussed the use of AI scribe software for clinical note transcription with the patient, who gave verbal consent to proceed.  History of Present Illness  Evelyn Baker is a 63 year old female with iron  deficiency anemia and acute left femoral vein deep vein thrombosis who presents for follow-up of persistent dyspnea, cough, fatigue, and ongoing management of anemia and venous thromboembolism.  She reports persistent dyspnea since her recent hospitalization, with slight worsening. Dyspnea is exacerbated by lying supine and improves when upright. She remains largely bedbound, elevates her legs frequently, and is unable to be active due to fatigue and shortness of breath. She uses an incentive spirometer as instructed. She reports that her recent pulmonary evaluation included a CT chest and pulmonary function testing, and she recalls being told there was no evidence of pulmonary embolism or parenchymal lung disease. She denies hemoptysis, fevers, chills, or sweats.  She continues to have a persistent cough, particularly nocturnal, associated with significant abdominal pain. Coughing is more frequent when supine and sometimes requires abdominal support with a pillow. She has not used over-the-counter cough suppressants due to blood pressure concerns. She completed a course of azithromycin  for presumed respiratory infection, with the last dose taken yesterday. She continues Symbicort  inhaler twice daily with appropriate mouth rinsing.  Fatigue and generalized weakness are prominent, with significant functional limitation and poor appetite. She requires assistance  for mobility and describes severe energy depletion. She continues to make efforts to eat despite persistent anorexia.  She is taking apixaban  twice daily for acute left femoral vein DVT, with no bleeding complications and no evidence of new pulmonary embolism. Lower extremity swelling persists, and she elevates her legs frequently. Compression stockings have not yet been initiated due to recent anticoagulation.  She continues oral iron  supplementation two to three times daily with orange juice and tolerates it well. Her hemoglobin is now 8 g/dL, and she recalls being told that her bone marrow is responding appropriately and there is no evidence of hemolysis. She denies abnormal bleeding, including melena or hematochezia.  Blood pressure management remains complex. She continues amlodipine  and carvedilol , had resumed chlorthalidone  per primary care, and uses Bidil  as needed for systolic pressures above 170. She notes episodes of hypotension.   SUMMARY OF HEMATOLOGY HISTORY:  63 y.o. lady who is a Jehovah's Witness, presented to the hospital on 06/03/2024 with complaints of left lower extremity pain and swelling.  Imaging indicative of DVT.  She was found to have iron  deficiency anemia with hemoglobin of 6.8, iron  decreased at 24, iron  saturation decreased at 14%.  We were consulted during that hospitalization for additional recommendations, since she cannot take blood products.   She was given IV iron , B12 and folic acid  supplementation and EPO.   She has not previously been diagnosed with anemia, but her family noted longstanding cold intolerance and fatigue, with worsening symptoms since 2020. She describes persistent cold sensation regardless of season, requiring extra clothing, and significant fatigue, somnolence, and decreased activity, including falling asleep quickly in the car and reduced driving.   She denied chest pain, hemoptysis, or overt bleeding, including melena or epistaxis. She  experiences mild cough and discomfort when lying flat, which resolves when sitting up  or when the room is less stuffy. She endorses occasional dyspnea, particularly when doors are closed, and intermittent dizziness and lightheadedness. She denies abdominal pain or changes in bowel habits. Colonoscopy ten years ago was normal; repeat is due in 2026.   Provided dietary counseling to increase iron  intake (green leafy vegetables, red meat, liver) and vitamin C supplementation to enhance absorption. - Advised to report symptoms of worsening anemia (chest pain, dyspnea, dizziness, palpitations). - Deferred invasive GI evaluation until hemoglobin improves, but will pursue if ongoing or unexplained blood loss is suspected. - Coordinate with care team to minimize phlebotomy blood loss (pediatric tubes, minimal draws).  She established with our clinic on 06/14/2024.  Hemoglobin was stable at 7.8.  She was advised to continue iron  supplements with ferrous sulfate  325 mg p.o. 3 times daily or at least twice daily as tolerated.  MEDICAL HISTORY Past Medical History:  Diagnosis Date   Asthma    Depression    Dyspnea    Hyperlipidemia    Hypertension    Hypothyroidism    Sciatic nerve disease    Thyroid  disease      SURGICAL HISTORY Past Surgical History:  Procedure Laterality Date   FOOT SURGERY Left    STRABISMUS SURGERY     age 50   TOTAL ABDOMINAL HYSTERECTOMY     VEIN LIGATION AND STRIPPING Left 01/22/2021   Procedure: LEFT GREATER SAPHENOUS VEIN  LIGATION AND STRIPPING;  Surgeon: Harvey Carlin BRAVO, MD;  Location: [facility] OR;  Service: Vascular;  Laterality: Left;     SOCIAL HISTORY: She reports that she has never smoked. She has never been exposed to tobacco smoke. She has never used smokeless tobacco. She reports that she does not drink alcohol and does not use drugs. Social History   Socioeconomic History   Marital status: Married    Spouse name: Not on file   Number of children: 4    Years of education: Not on file   Highest education level: Not on file  Occupational History   Not on file  Tobacco Use   Smoking status: Never    Passive exposure: Never   Smokeless tobacco: Never  Vaping Use   Vaping status: Never Used  Substance and Sexual Activity   Alcohol use: No   Drug use: No   Sexual activity: Yes    Birth control/protection: Surgical    Comment: partial hysterectomy  Other Topics Concern   Not on file  Social History Narrative   Not on file   Social Drivers of Health   Tobacco Use: Low Risk (06/17/2024)   Patient History    Smoking Tobacco Use: Never    Smokeless Tobacco Use: Never    Passive Exposure: Never  Financial Resource Strain: Not on file  Food Insecurity: No Food Insecurity (06/03/2024)   Epic    Worried About Programme Researcher, Broadcasting/film/video in the Last Year: Never true    Ran Out of Food in the Last Year: Never true  Transportation Needs: No Transportation Needs (06/03/2024)   Epic    Lack of Transportation (Medical): No    Lack of Transportation (Non-Medical): No  Physical Activity: Not on file  Stress: Not on file  Social Connections: Socially Integrated (06/04/2024)   Social Connection and Isolation Panel    Frequency of Communication with Friends and Family: More than three times a week    Frequency of Social Gatherings with Friends and Family: More than three times a week    Attends Religious  Services: More than 4 times per year    Active Member of Clubs or Organizations: Yes    Attends Banker Meetings: More than 4 times per year    Marital Status: Married  Catering Manager Violence: Not At Risk (06/03/2024)   Epic    Fear of Current or Ex-Partner: No    Emotionally Abused: No    Physically Abused: No    Sexually Abused: No  Depression (PHQ2-9): Not on file  Alcohol Screen: Not on file  Housing: Low Risk (06/03/2024)   Epic    Unable to Pay for Housing in the Last Year: No    Number of Times Moved in the Last Year: 0     Homeless in the Last Year: No  Utilities: Not At Risk (06/03/2024)   Epic    Threatened with loss of utilities: No  Health Literacy: Not on file    FAMILY HISTORY: Her family history includes Aneurysm in her sister; Heart Problems in her brother; Heart failure in her mother; Hypertension in her daughter and son; Kidney failure in her mother; Stroke in her sister.  CURRENT MEDICATIONS   Current Outpatient Medications  Medication Instructions   Acetaminophen  Extra Strength 1,000 mg, Oral, Every 8 hours PRN   albuterol  (VENTOLIN  HFA) 108 (90 Base) MCG/ACT inhaler 2 puffs, Every 4 hours PRN   amLODipine  (NORVASC ) 10 mg, Oral, Daily   azathioprine  (IMURAN ) 100 MG tablet Take 1 tablet (100 mg total) by mouth daily as directed   azithromycin  (ZITHROMAX ) 250 MG tablet Take 2 tablets (500 mg total) by mouth daily for 1 day, THEN 1 tablet (250 mg total) daily for 4 days.   B-12 1,000 mcg, Oral, Daily   budesonide -formoterol  (SYMBICORT ) 160-4.5 MCG/ACT inhaler 2 puffs, Inhalation, 2 times daily   carvedilol  (COREG ) 6.25 mg, Oral, 2 times daily with meals   Eliquis  5 mg, Oral, 2 times daily   EPINEPHrine  0.3 mg/0.3 mL IJ SOAJ injection Inject 0.3 mg into the muscle as needed as directed.   FeroSul 325 mg, Oral, 3 times daily with meals   folic acid  (FOLVITE ) 1 mg, Oral, Daily   hydrALAZINE  (APRESOLINE ) 25 MG tablet Take 1 tablet (25 mg) once daily as needed for systolic blood pressure > 170   isosorbide -hydrALAZINE  (BIDIL ) 20-37.5 MG tablet 1 tablet, Oral, 3 times daily   levothyroxine  (SYNTHROID ) 112 MCG tablet Take 1 tablet (112 mcg total) by mouth in the morning on an empty stomach.   ondansetron  (ZOFRAN ) 4 mg, Oral, Every 6 hours PRN   pantoprazole  (PROTONIX ) 40 mg, Oral, 2 times daily   potassium chloride  (MICRO-K ) 10 MEQ CR capsule 10 mEq, Oral, 2 times daily   rosuvastatin  (CRESTOR ) 10 mg, Oral, Daily   senna-docusate (SENOKOT-S) 8.6-50 MG tablet 1 tablet, Oral, At bedtime PRN      ALLERGIES  She is allergic to shellfish allergy, avocado, codeine , iodinated contrast media, pineapple, and penicillins.  REVIEW OF SYSTEMS:  Review of Systems - Oncology   Rest of the pertinent review of systems is unremarkable except as mentioned above in HPI.  PHYSICAL EXAMINATION:    Onc Performance Status - 06/20/24 1217       ECOG Perf Status   ECOG Perf Status Ambulatory and capable of all selfcare but unable to carry out any work activities.  Up and about more than 50% of waking hours      KPS SCALE   KPS % SCORE Normal activity with effort, some s/s of disease  Vitals:   06/20/24 1216  BP: 109/67  Pulse: 92  Resp: 14  Temp: 98.7 F (37.1 C)    Filed Weights   06/20/24 1216  Weight: 203 lb 11.2 oz (92.4 kg)     Physical Exam Constitutional:      General: She is not in acute distress.    Appearance: Normal appearance.  HENT:     Head: Normocephalic and atraumatic.  Cardiovascular:     Rate and Rhythm: Normal rate.  Pulmonary:     Effort: Pulmonary effort is normal. No respiratory distress.  Abdominal:     General: There is no distension.  Neurological:     General: No focal deficit present.     Mental Status: She is alert and oriented to person, place, and time.  Psychiatric:        Mood and Affect: Mood normal.        Behavior: Behavior normal.      LABORATORY DATA:   I have reviewed the data as listed.  Results for orders placed or performed in visit on 06/20/24  Reticulocytes  Result Value Ref Range   Retic Ct Pct 1.7 0.4 - 3.1 %   RBC. 4.16 3.87 - 5.11 MIL/uL   Retic Count, Absolute 69.9 19.0 - 186.0 K/uL   Immature Retic Fract 8.2 2.3 - 15.9 %  CBC with Differential (Cancer Center Only)  Result Value Ref Range   WBC Count 8.9 4.0 - 10.5 K/uL   RBC 3.05 (L) 3.87 - 5.11 MIL/uL   Hemoglobin 8.0 (L) 12.0 - 15.0 g/dL   HCT 77.7 (L) 63.9 - 53.9 %   MCV 72.8 (L) 80.0 - 100.0 fL   MCH 26.2 26.0 - 34.0 pg   MCHC 36.0  30.0 - 36.0 g/dL   RDW 80.7 (H) 88.4 - 84.4 %   Platelet Count 256 150 - 400 K/uL   nRBC 0.7 (H) 0.0 - 0.2 %   Neutrophils Relative % 86 %   Neutro Abs 7.6 1.7 - 7.7 K/uL   Lymphocytes Relative 4 %   Lymphs Abs 0.3 (L) 0.7 - 4.0 K/uL   Monocytes Relative 9 %   Monocytes Absolute 0.8 0.1 - 1.0 K/uL   Eosinophils Relative 0 %   Eosinophils Absolute 0.0 0.0 - 0.5 K/uL   Basophils Relative 0 %   Basophils Absolute 0.0 0.0 - 0.1 K/uL   Immature Granulocytes 1 %   Abs Immature Granulocytes 0.08 (H) 0.00 - 0.07 K/uL     RADIOGRAPHIC STUDIES:  I have personally reviewed the radiological images as listed and agreed with the findings in the report.  CT ABDOMEN PELVIS WO CONTRAST Result Date: 06/10/2024 CLINICAL DATA:  Anemia.  Concern for retroperitoneal hematoma. EXAM: CT ABDOMEN AND PELVIS WITHOUT CONTRAST TECHNIQUE: Multidetector CT imaging of the abdomen and pelvis was performed following the standard protocol without IV contrast. RADIATION DOSE REDUCTION: This exam was performed according to the departmental dose-optimization program which includes automated exposure control, adjustment of the mA and/or kV according to patient size and/or use of iterative reconstruction technique. COMPARISON:  CT abdomen and pelvis dated 03/20/2024 FINDINGS: Lower chest: Bilateral lower lobe relaxation atelectasis. Trace right and small left pleural effusions. Partially imaged heart size is normal. Trace pericardial effusion. Hepatobiliary: No focal hepatic lesions. No intra or extrahepatic biliary ductal dilation. Normal gallbladder. Pancreas: No focal lesions or main ductal dilation. Spleen: Normal in size without focal abnormality. Adrenals/Urinary Tract: No adrenal nodules. No suspicious renal mass on this noncontrast  enhanced examination, calculi or hydronephrosis. No focal bladder wall thickening. Stomach/Bowel: Small hiatal hernia. Normal appearance of the stomach. No evidence of bowel wall thickening,  distention, or inflammatory changes. Small and large bowel are diffusely underdistended. Normal appendix. Vascular/Lymphatic: Aortic atherosclerosis. Superficial varicose veins anterior to the pubis. No enlarged abdominal or pelvic lymph nodes. Reproductive: No adnexal masses. Other: Trace presacral free fluid.  No free air or fluid collection. Musculoskeletal: No acute or abnormal lytic or blastic osseous lesions. Diffuse body wall edema. IMPRESSION: 1. No acute abdominopelvic abnormality. No evidence of retroperitoneal hematoma. 2. Findings of volume overload with trace right and small left pleural effusions, diffuse body wall edema, trace presacral edema, and pericardial effusion. 3.  Aortic Atherosclerosis (ICD10-I70.0). Electronically Signed   By: Limin  Xu M.D.   On: 06/10/2024 16:05   NM Pulmonary Perfusion Result Date: 06/06/2024 CLINICAL DATA:  Shortness of breath on minimal exertion. Deep venous thrombosis. EXAM: NUCLEAR MEDICINE PERFUSION LUNG SCAN TECHNIQUE: Perfusion images were obtained in multiple projections after intravenous injection of radiopharmaceutical. No ventilation imaging obtained. RADIOPHARMACEUTICALS:  4.4 mCi Tc-53m MAA IV COMPARISON:  Chest radiographs 06/05/2024.  Chest CT 03/22/2024 FINDINGS: The pulmonary perfusion is within normal limits. There are no wedge-shaped perfusion defects to suggest acute pulmonary embolism. IMPRESSION: No evidence of acute pulmonary embolism on perfusion scintigraphy by PISAPED criteria. Electronically Signed   By: Elsie Perone M.D.   On: 06/06/2024 15:36   DG CHEST PORT 1 VIEW Result Date: 06/05/2024 EXAM: 1 VIEW(S) XRAY OF THE CHEST 06/05/2024 08:56:00 PM COMPARISON: 06/04/2024 CLINICAL HISTORY: Respiratory abnormality FINDINGS: LUNGS AND PLEURA: Mild pulmonary edema. Mild bilateral interstitial prominence. Mild bilateral peribronchial cuffing. No pleural effusion. No pneumothorax. HEART AND MEDIASTINUM: Borderline cardiomegaly. No acute  abnormality of the mediastinal silhouette. BONES AND SOFT TISSUES: No acute osseous abnormality. IMPRESSION: 1. Mild pulmonary edema. Electronically signed by: Morgane Naveau MD 06/05/2024 11:15 PM EST RP Workstation: [id number]   US  RENAL Result Date: 06/04/2024 EXAM: US  Retroperitoneum Complete, Renal. 06/04/2024 05:56:29 PM TECHNIQUE: Real-time ultrasonography of the retroperitoneum renal was performed. COMPARISON: None available CLINICAL HISTORY: Acute renal failure. FINDINGS: FINDINGS: RIGHT KIDNEY/URETER: Right kidney measures 12.5 x 4.9 x 5.7 cm. Mildly increased echotexture. No hydronephrosis. No calculus. No mass. LEFT KIDNEY/URETER: Left kidney measures 11.5 x 5.4 x 4.5 cm. Mildly increased echotexture. No hydronephrosis. No calculus. No mass. BLADDER: Unremarkable appearance of the bladder. IMPRESSION: 1. Mildly increased renal cortical echotexture bilaterally, which can be seen with medical renal disease or acute kidney injury. Electronically signed by: Franky Crease MD 06/04/2024 07:41 PM EST RP Workstation: [id number]   DG Chest Port 1 View Result Date: 06/04/2024 EXAM: 1 VIEW(S) XRAY OF THE CHEST 06/04/2024 09:11:00 AM COMPARISON: 03/20/2024 CLINICAL HISTORY: Shortness of breath. FINDINGS: LUNGS AND PLEURA: Airway thickening suggests bronchitis or reactive airways disease. Mild upper zone pulmonary vascular prominence, cannot exclude pulmonary venous hypertension. No focal pulmonary opacity. No pleural effusion. No pneumothorax. HEART AND MEDIASTINUM: Mild cardiomegaly. No acute abnormality of the mediastinal silhouette. BONES AND SOFT TISSUES: No acute osseous abnormality. IMPRESSION: 1. Airway thickening suggestive of bronchitis or reactive airways disease. 2. Mild upper zone pulmonary vascular prominence, cannot exclude pulmonary venous hypertension. 3. Mild cardiomegaly. Electronically signed by: Ryan Salvage MD 06/04/2024 01:17 PM EST RP Workstation: [id number]   US  Venous Img Lower   Left (DVT Study) Result Date: 06/03/2024 CLINICAL DATA:  left leg edema EXAM: LEFT LOWER EXTREMITY VENOUS DOPPLER ULTRASOUND TECHNIQUE: Gray-scale sonography with graded compression, as well as color Doppler and duplex  ultrasound were performed to evaluate the lower extremity deep venous systems from the level of the common femoral vein and including the common femoral, femoral, profunda femoral, popliteal and calf veins including the posterior tibial, peroneal and gastrocnemius veins when visible. The superficial great saphenous vein was also interrogated. Spectral Doppler was utilized to evaluate flow at rest and with distal augmentation maneuvers in the common femoral, femoral and popliteal veins. COMPARISON:  None Available. FINDINGS: Contralateral Common Femoral Vein: Respiratory phasicity is normal and symmetric with the symptomatic side. No evidence of thrombus. Normal compressibility. Common Femoral Vein: Partial compressibility of the common femoral vein. Vascular Doppler flow is present. Saphenofemoral Junction: No evidence of thrombus. Normal compressibility and flow on color Doppler imaging. Profunda Femoral Vein: No evidence of thrombus. Normal compressibility and flow on color Doppler imaging. Femoral Vein: No evidence of thrombus. Normal compressibility, respiratory phasicity and response to augmentation. Popliteal Vein: No evidence of thrombus. Normal compressibility, respiratory phasicity and response to augmentation. Calf Veins: No evidence of thrombus. Normal compressibility and flow on color Doppler imaging. Superficial Great Saphenous Vein: No evidence of thrombus. Normal compressibility. Other Findings:  None. IMPRESSION: Partial compressibility of the left common femoral vein, worrisome for a nonocclusive thrombus. Electronically Signed   By: Rogelia Myers M.D.   On: 06/03/2024 16:20    Orders Placed This Encounter  Procedures   CBC with Differential (Cancer Center Only)    Standing  Status:   Future    Expiration Date:   06/20/2025   Basic Metabolic Panel - Cancer Center Only    Standing Status:   Future    Expiration Date:   06/20/2025   D-dimer, quantitative    Standing Status:   Future    Expiration Date:   06/20/2025    Future Appointments  Date Time Provider Department Center  06/21/2024  9:30 AM Barbarann Dixon B, RPH-CPP [facility]  07/15/2024  1:45 PM Teresa Dorsey BRAVO, RPH [facility]    I spent a total of 33 minutes during this encounter with the patient including review of chart and various tests results, discussions about plan of care and coordination of care plan.  This document was completed utilizing speech recognition software. Grammatical errors, random word insertions, pronoun errors, and incomplete sentences are an occasional consequence of this system due to software limitations, ambient noise, and hardware issues. Any formal questions or concerns about the content, text or information contained within the body of this dictation should be directly addressed to the provider for clarification.  "

## 2024-06-20 NOTE — Progress Notes (Unsigned)
 " DVT Clinic Note  Name: Evelyn Baker     MRN: [id number]     DOB: [birth year]     Sex: female  PCP: Katina Pfeiffer, PA-C  Today's Visit: Visit Information: Initial Visit  Referred to DVT Clinic by: Hospitalist - Dr. Sherrill Referred to CPP by: Dr. Gretta Reason for referral:  Chief Complaint  Patient presents with   Med Management - DVT   HISTORY OF PRESENT ILLNESS: Evelyn Baker is a 63 y.o. 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 Patient was admitted to the hospital 12/5-12/15/25 for LLE pain and swelling, which was initially treated outpatient as cellulitis but was not improving. Left lower extremity venous ultrasound showed partial compressibility of the left common femoral vein worrisome for a nonocclusive thrombus. S/p 4-hour car ride prior to symptom onset (2 hours to [place] and back in one day in October). Hospital course complicated by AKI and anemia. Eliquis  was held on discharge until patient could be seen by hematology. Was seen by Dr. Autumn on 06/14/24 - hemoglobin was improving and Eliquis  was resumed with Plavix  discontinued (prior hx TIA).   Today, patient reports that she continues to have pain and swelling in her left leg. Reports that LLE swelling initially started November 20th. Primary location of pain is below her calf. No swelling or pain above the knee. Denies abnormal bleeding or bruising. Denies missed doses of Eliquis . Taking it at 10-10:30am and 7pm. Denies prior history of DVT but does have history of varicose veins. Had vein stripping in 2022 by Dr. Harvey. Has been elevating her legs but as soon as she puts them down the swelling and pain resume. Says her hematologist told her to wait 4 weeks to wear compression.   Positive Thrombotic Risk Factors: Other (comment), Non-malignant, chronic inflammatory condition (4 hour drive a couple weeks before symptom  onset) Bleeding Risk Factors: Anemia, Anticoagulant therapy  Negative Thrombotic Risk Factors: Previous VTE, Recent surgery (within 3 months), Recent trauma (within 3 months), Recent admission to hospital with acute illness (within 3 months), Paralysis, paresis, or recent plaster cast immobilization of lower extremity, Central venous catheterization, Bed rest >72 hours within 3 months, Sedentary journey lasting >8 hours within 4 weeks, Pregnancy, Within 6 weeks postpartum, Recent cesarean section (within 3 months), Estrogen therapy, Testosterone therapy, Erythropoiesis-stimulating agent, Recent COVID diagnosis (within 3 months), Active cancer, Known thrombophilic condition, Smoking, Obesity, Older age  Rx Insurance Coverage: Medicaid Rx Affordability: Eliquis  is $4/month Rx Assistance Provided: None needed at this time Preferred Pharmacy: [facility]  Past Medical History:  Diagnosis Date   Asthma    Depression    Dyspnea    Hyperlipidemia    Hypertension    Hypothyroidism    Sciatic nerve disease    Thyroid  disease     Past Surgical History:  Procedure Laterality Date   FOOT SURGERY Left    STRABISMUS SURGERY     age 33   TOTAL ABDOMINAL HYSTERECTOMY     VEIN LIGATION AND STRIPPING Left 01/22/2021   Procedure: LEFT GREATER SAPHENOUS VEIN  LIGATION AND STRIPPING;  Surgeon: Harvey Carlin BRAVO, MD;  Location: [facility] OR;  Service: Vascular;  Laterality: Left;    Social History   Socioeconomic History   Marital status: Married    Spouse name: Not on file   Number of children: 4   Years of education: Not on file  Highest education level: Not on file  Occupational History   Not on file  Tobacco Use   Smoking status: Never    Passive exposure: Never   Smokeless tobacco: Never  Vaping Use   Vaping status: Never Used  Substance and Sexual Activity   Alcohol use: No   Drug use: No   Sexual activity: Yes    Birth control/protection: Surgical    Comment: partial  hysterectomy  Other Topics Concern   Not on file  Social History Narrative   Not on file   Social Drivers of Health   Tobacco Use: Low Risk (06/21/2024)   Patient History    Smoking Tobacco Use: Never    Smokeless Tobacco Use: Never    Passive Exposure: Never  Financial Resource Strain: Not on file  Food Insecurity: No Food Insecurity (06/03/2024)   Epic    Worried About Programme Researcher, Broadcasting/film/video in the Last Year: Never true    Ran Out of Food in the Last Year: Never true  Transportation Needs: No Transportation Needs (06/03/2024)   Epic    Lack of Transportation (Medical): No    Lack of Transportation (Non-Medical): No  Physical Activity: Not on file  Stress: Not on file  Social Connections: Socially Integrated (06/04/2024)   Social Connection and Isolation Panel    Frequency of Communication with Friends and Family: More than three times a week    Frequency of Social Gatherings with Friends and Family: More than three times a week    Attends Religious Services: More than 4 times per year    Active Member of Clubs or Organizations: Yes    Attends Banker Meetings: More than 4 times per year    Marital Status: Married  Catering Manager Violence: Not At Risk (06/03/2024)   Epic    Fear of Current or Ex-Partner: No    Emotionally Abused: No    Physically Abused: No    Sexually Abused: No  Depression (PHQ2-9): Not on file  Alcohol Screen: Not on file  Housing: Low Risk (06/03/2024)   Epic    Unable to Pay for Housing in the Last Year: No    Number of Times Moved in the Last Year: 0    Homeless in the Last Year: No  Utilities: Not At Risk (06/03/2024)   Epic    Threatened with loss of utilities: No  Health Literacy: Not on file    Family History  Problem Relation Age of Onset   Heart failure Mother    Kidney failure Mother    Stroke Sister    Aneurysm Sister    Heart Problems Brother    Hypertension Son    Hypertension Daughter     Allergies as of 06/21/2024 -  Review Complete 06/21/2024  Allergen Reaction Noted   Shellfish allergy Anaphylaxis and Swelling 02/19/2012   Avocado Swelling 01/17/2021   Codeine  Other (See Comments) 04/02/2018   Iodinated contrast media Other (See Comments) 05/05/2022   Pineapple Swelling 01/17/2021   Penicillins Rash 01/17/2021    Medications Ordered Prior to Encounter[1] REVIEW OF SYSTEMS:  Review of Systems  Respiratory:  Negative for shortness of breath.   Cardiovascular:  Positive for leg swelling. Negative for chest pain and palpitations.  Musculoskeletal:  Positive for myalgias.  Neurological:  Negative for dizziness and tingling.   PHYSICAL EXAMINATION:  Physical Exam Pulmonary:     Effort: Pulmonary effort is normal.  Musculoskeletal:        General: Tenderness present.  Left lower leg: Edema present.  Skin:    Findings: No bruising or erythema.  Psychiatric:        Mood and Affect: Mood normal.        Behavior: Behavior normal.        Thought Content: Thought content normal.   Villalta Score for Post-Thrombotic Syndrome: Pain: Moderate Cramps: Absent Heaviness: Mild Paresthesia: Absent Pruritus: Absent Pretibial Edema: Moderate Skin Induration: Absent Hyperpigmentation: Absent Redness: Absent Venous Ectasia: Absent Pain on calf compression: Mild Villalta Preliminary Score: 6 Is venous ulcer present?: No If venous ulcer is present and score is <15, then 15 points total are assigned: Absent Villalta Total Score: 6  LABS:  CBC     Component Value Date/Time   WBC 8.9 06/20/2024 1152   WBC 6.6 06/13/2024 0430   RBC 3.05 (L) 06/20/2024 1152   RBC 4.16 06/20/2024 1151   HGB 8.0 (L) 06/20/2024 1152   HGB 8.5 (L) 03/15/2024 1317   HCT 22.2 (L) 06/20/2024 1152   HCT 26.4 (L) 03/15/2024 1317   PLT 256 06/20/2024 1152   PLT 253 03/15/2024 1317   MCV 72.8 (L) 06/20/2024 1152   MCV 83 03/15/2024 1317   MCH 26.2 06/20/2024 1152   MCHC 36.0 06/20/2024 1152   RDW 19.2 (H) 06/20/2024  1152   RDW 16.0 (H) 03/15/2024 1317   LYMPHSABS 0.3 (L) 06/20/2024 1152   LYMPHSABS 0.7 03/15/2024 1317   MONOABS 0.8 06/20/2024 1152   EOSABS 0.0 06/20/2024 1152   EOSABS 0.1 03/15/2024 1317   BASOSABS 0.0 06/20/2024 1152   BASOSABS 0.0 03/15/2024 1317    Hepatic Function      Component Value Date/Time   PROT 6.6 06/13/2024 0430   PROT 7.1 03/15/2024 1317   ALBUMIN  3.2 (L) 06/13/2024 0430   ALBUMIN  3.6 (L) 03/15/2024 1317   AST 43 (H) 06/13/2024 0430   ALT 32 06/13/2024 0430   ALKPHOS 144 (H) 06/13/2024 0430   BILITOT 0.4 06/13/2024 0430   BILITOT 0.5 03/15/2024 1317    Renal Function   Lab Results  Component Value Date   CREATININE 1.26 (H) 06/13/2024   CREATININE 1.34 (H) 06/12/2024   CREATININE 1.48 (H) 06/11/2024    Estimated Creatinine Clearance: 52.7 mL/min (A) (by C-G formula based on SCr of 1.26 mg/dL (H)).   [facility] Vascular Lab Studies:  06/03/24 US  Venous Img Lower  Left (DVT Study) FINDINGS: Contralateral Common Femoral Vein: Respiratory phasicity is normal and symmetric with the symptomatic side. No evidence of thrombus. Normal compressibility.   Common Femoral Vein: Partial compressibility of the common femoral vein. Vascular Doppler flow is present.   Saphenofemoral Junction: No evidence of thrombus. Normal compressibility and flow on color Doppler imaging.   Profunda Femoral Vein: No evidence of thrombus. Normal compressibility and flow on color Doppler imaging.   Femoral Vein: No evidence of thrombus. Normal compressibility, respiratory phasicity and response to augmentation.   Popliteal Vein: No evidence of thrombus. Normal compressibility, respiratory phasicity and response to augmentation.   Calf Veins: No evidence of thrombus. Normal compressibility and flow on color Doppler imaging.   Superficial Great Saphenous Vein: No evidence of thrombus. Normal compressibility.   Other Findings:  None.   IMPRESSION: Partial compressibility of the  left common femoral vein, worrisome for a nonocclusive thrombus.  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 Anticoagulation was briefly interrupted while in the hospital due to anemia but was resumed 06/14/24 by hematologist  Dr. Autumn. She continues to tolerate Eliquis  well. We discussed trying to separate doses by about 12 hours. Due to involvement of the common femoral vein, discussed patient with Dr. Gretta in clinic today. The clot is nonocclusive and limited to the CFV, and given she is asymptomatic above the knee, no role for vascular intervention. He recommends compression stockings to help with calf swelling. No contraindication to compression stocking use from our standpoint. Fitted her for knee high 15-20 mmHg stockings today which she tried on in office and confirmed are an appropriate fit. She will continue to follow closely with hematology. We will defer the decision on duration of treatment to heme. All the patient's questions at this time have been answered.   PLAN: -Continue apixaban  (Eliquis ) 5 mg twice daily. -Expected duration of therapy: per hematology. Therapy started on 06/14/24 (restarted). -Patient educated on purpose, proper use and potential adverse effects of apixaban  (Eliquis ). -Discussed importance of taking medication around the same time every day. -Advised patient of medications to avoid (NSAIDs, aspirin doses >100 mg daily). -Educated that Tylenol  (acetaminophen ) is the preferred analgesic to lower the risk of bleeding. -Advised patient to alert all providers of anticoagulation therapy prior to starting a new medication or having a procedure. -Emphasized importance of monitoring for signs and symptoms of bleeding (abnormal bruising, prolonged bleeding, nose bleeds, bleeding from gums, discolored urine, black tarry stools). -Educated patient to present to the ED if emergent signs and symptoms of new  thrombosis occur. -Counseled patient to wear compression stockings daily, removing at night.  Follow up: with hematology. DVT Clinic as needed.   Lum Herald, PharmD, [place], CPP [facility] [phone]    [1]  Current Outpatient Medications on File Prior to Visit  Medication Sig Dispense Refill   acetaminophen  (TYLENOL ) 500 MG tablet Take 2 tablets (1,000 mg total) by mouth every 8 (eight) hours as needed. 30 tablet 0   albuterol  (VENTOLIN  HFA) 108 (90 Base) MCG/ACT inhaler Inhale 2 puffs into the lungs every 4 (four) hours as needed for wheezing or shortness of breath.     amLODipine  (NORVASC ) 10 MG tablet Take 1 tablet (10 mg total) by mouth daily. 30 tablet 0   apixaban  (ELIQUIS ) 5 MG TABS tablet Take 1 tablet (5 mg total) by mouth 2 (two) times daily. 60 tablet 3   Ascorbic Acid (VITAMIN C) 100 MG tablet Take 100 mg by mouth daily.     azathioprine  (IMURAN ) 100 MG tablet Take 1 tablet (100 mg total) by mouth daily as directed 90 tablet 0   budesonide -formoterol  (SYMBICORT ) 160-4.5 MCG/ACT inhaler Inhale 2 puffs into the lungs 2 (two) times daily. 10.2 g 12   carvedilol  (COREG ) 6.25 MG tablet Take 1 tablet (6.25 mg total) by mouth 2 (two) times daily with a meal. 60 tablet 0   cyanocobalamin  1000 MCG tablet Take 1 tablet (1,000 mcg total) by mouth daily. 30 tablet 0   EPINEPHrine  0.3 mg/0.3 mL IJ SOAJ injection Inject 0.3 mg into the muscle as needed as directed. 2 each 1   ferrous sulfate  325 (65 FE) MG tablet Take 1 tablet (325 mg total) by mouth 3 (three) times daily with meals. 100 tablet 0   folic acid  (FOLVITE ) 1 MG tablet Take 1 tablet (1 mg total) by mouth daily. 30 tablet 0   hydrALAZINE  (APRESOLINE ) 25 MG tablet Take 1 tablet (25 mg) once daily as needed for systolic blood pressure > 170 (Patient taking differently: Take 25  mg by mouth daily.) 30 tablet 1   isosorbide -hydrALAZINE  (BIDIL ) 20-37.5 MG tablet Take 1 tablet by mouth 3  (three) times daily. 90 tablet 1   levothyroxine  (SYNTHROID ) 112 MCG tablet Take 1 tablet (112 mcg total) by mouth in the morning on an empty stomach. 30 tablet 5   ondansetron  (ZOFRAN ) 4 MG tablet Take 1 tablet (4 mg total) by mouth every 6 (six) hours as needed for nausea. 20 tablet 0   pantoprazole  (PROTONIX ) 40 MG tablet Take 1 tablet (40 mg total) by mouth 2 (two) times daily. 60 tablet 0   potassium chloride  (MICRO-K ) 10 MEQ CR capsule Take 1 capsule (10 mEq total) by mouth 2 (two) times daily. 180 capsule 2   rosuvastatin  (CRESTOR ) 10 MG tablet Take 1 tablet (10 mg total) by mouth daily. 90 tablet 3   senna-docusate (SENOKOT-S) 8.6-50 MG tablet Take 1 tablet by mouth at bedtime as needed for mild constipation. 30 tablet 0   No current facility-administered medications on file prior to visit.   "

## 2024-06-20 NOTE — Assessment & Plan Note (Signed)
 Chronic normocytic anemia is improving, with hemoglobin increased from 7.0 to 8 g/dL.   No evidence of ongoing hemolysis-LDH, haptoglobin were within normal limits and Coombs test negative on 06/14/2024.  Marrow response is adequate following a short course of erythropoietin  when she was recently hospitalized.  She also received Venofer , at least 3 doses during recent hospital stay.   She tolerates oral iron , though constipation is managed. No overt blood loss or symptoms of acute anemia.   Energy levels have improved since hospitalization.  - Continued oral iron  three times daily with vitamin C to enhance absorption.  - Advised monitoring for constipation; use Senokot as needed and MiraLAX daily if regular bowel movements are not achieved.  - Discussed reducing iron  to twice daily if constipation becomes severe, with goal to decrease to once daily as tolerated.  - In the future, we will consider restarting erythropoietin  injections, as needed.  - Continue folic acid  supplementation  - Planned to check blood counts at next visit.  - Advised to report any signs of bleeding, including hematochezia, melena, or epistaxis.  RTC in 2 weeks for follow-up with repeat labs

## 2024-06-21 ENCOUNTER — Encounter: Payer: Self-pay | Admitting: Student-PharmD

## 2024-06-21 ENCOUNTER — Ambulatory Visit: Attending: Surgery | Admitting: Student-PharmD

## 2024-06-21 DIAGNOSIS — I82412 Acute embolism and thrombosis of left femoral vein: Secondary | ICD-10-CM | POA: Diagnosis not present

## 2024-06-21 DIAGNOSIS — M7989 Other specified soft tissue disorders: Secondary | ICD-10-CM

## 2024-06-21 NOTE — Patient Instructions (Signed)
-  Continue Eliquis  5 mg twice daily.  -Dr. Autumn will manage your refills and duration of treatment.  -We recommend you wear compression stockings as long as you are having swelling or pain. Be sure to purchase the correct size and take them off at night.  -It is important to take your medication around the same time every day.  -Avoid NSAIDs like ibuprofen  (Advil , Motrin ) and naproxen (Aleve) as well as aspirin doses over 100 mg daily. -Tylenol  (acetaminophen ) is the preferred over the counter pain medication to lower the risk of bleeding. -Be sure to alert all of your health care providers that you are taking an anticoagulant prior to starting a new medication or having a procedure. -Monitor for signs and symptoms of bleeding (abnormal bruising, prolonged bleeding, nose bleeds, bleeding from gums, discolored urine, black tarry stools). If you have fallen and hit your head OR if your bleeding is severe or not stopping, seek emergency care.  -Go to the emergency room if emergent signs and symptoms of new clot occur (new or worse swelling and pain in an arm or leg, shortness of breath, chest pain, fast or irregular heartbeats, lightheadedness, dizziness, fainting, coughing up blood) or if you experience a significant color change (pale or blue) in the extremity that has the DVT.   If you have any questions or need to reschedule an appointment, please call [phone]. If you are having an emergency, call 911 or present to the nearest emergency room.   What is a DVT?  -Deep vein thrombosis (DVT) is a condition in which a blood clot forms in a vein of the deep venous system which can occur in the lower leg, thigh, pelvis, arm, or neck. This condition is serious and can be life-threatening if the clot travels to the arteries of the lungs and causing a blockage (pulmonary embolism, PE). A DVT can also damage veins in the leg, which can lead to long-term venous disease, leg pain, swelling, discoloration, and  ulcers or sores (post-thrombotic syndrome).  -Treatment may include taking an anticoagulant medication to prevent more clots from forming and the current clot from growing, wearing compression stockings, and/or surgical procedures to remove or dissolve the clot.

## 2024-06-22 ENCOUNTER — Encounter: Payer: Self-pay | Admitting: Oncology

## 2024-06-27 ENCOUNTER — Inpatient Hospital Stay (HOSPITAL_BASED_OUTPATIENT_CLINIC_OR_DEPARTMENT_OTHER)
Admission: EM | Admit: 2024-06-27 | Discharge: 2024-07-06 | DRG: 314 | Disposition: A | Discharge status: Home / Self Care | Attending: Internal Medicine | Admitting: Internal Medicine

## 2024-06-27 ENCOUNTER — Emergency Department (HOSPITAL_BASED_OUTPATIENT_CLINIC_OR_DEPARTMENT_OTHER): Admitting: Radiology

## 2024-06-27 ENCOUNTER — Other Ambulatory Visit: Payer: Self-pay

## 2024-06-27 ENCOUNTER — Encounter: Payer: Self-pay | Admitting: Internal Medicine

## 2024-06-27 DIAGNOSIS — I509 Heart failure, unspecified: Principal | ICD-10-CM

## 2024-06-27 DIAGNOSIS — Z7951 Long term (current) use of inhaled steroids: Secondary | ICD-10-CM

## 2024-06-27 DIAGNOSIS — Z7901 Long term (current) use of anticoagulants: Secondary | ICD-10-CM

## 2024-06-27 DIAGNOSIS — E785 Hyperlipidemia, unspecified: Secondary | ICD-10-CM | POA: Diagnosis present

## 2024-06-27 DIAGNOSIS — I5033 Acute on chronic diastolic (congestive) heart failure: Secondary | ICD-10-CM | POA: Diagnosis present

## 2024-06-27 DIAGNOSIS — Z79624 Long term (current) use of inhibitors of nucleotide synthesis: Secondary | ICD-10-CM

## 2024-06-27 DIAGNOSIS — E871 Hypo-osmolality and hyponatremia: Secondary | ICD-10-CM | POA: Diagnosis present

## 2024-06-27 DIAGNOSIS — Z880 Allergy status to penicillin: Secondary | ICD-10-CM

## 2024-06-27 DIAGNOSIS — Z832 Family history of diseases of the blood and blood-forming organs and certain disorders involving the immune mechanism: Secondary | ICD-10-CM

## 2024-06-27 DIAGNOSIS — E039 Hypothyroidism, unspecified: Secondary | ICD-10-CM | POA: Diagnosis present

## 2024-06-27 DIAGNOSIS — D509 Iron deficiency anemia, unspecified: Secondary | ICD-10-CM | POA: Diagnosis present

## 2024-06-27 DIAGNOSIS — I130 Hypertensive heart and chronic kidney disease with heart failure and stage 1 through stage 4 chronic kidney disease, or unspecified chronic kidney disease: Secondary | ICD-10-CM | POA: Diagnosis present

## 2024-06-27 DIAGNOSIS — Z6831 Body mass index (BMI) 31.0-31.9, adult: Secondary | ICD-10-CM

## 2024-06-27 DIAGNOSIS — Z885 Allergy status to narcotic agent status: Secondary | ICD-10-CM

## 2024-06-27 DIAGNOSIS — D649 Anemia, unspecified: Secondary | ICD-10-CM

## 2024-06-27 DIAGNOSIS — J45909 Unspecified asthma, uncomplicated: Secondary | ICD-10-CM | POA: Diagnosis present

## 2024-06-27 DIAGNOSIS — Z8673 Personal history of transient ischemic attack (TIA), and cerebral infarction without residual deficits: Secondary | ICD-10-CM

## 2024-06-27 DIAGNOSIS — I3139 Other pericardial effusion (noninflammatory): Principal | ICD-10-CM | POA: Diagnosis present

## 2024-06-27 DIAGNOSIS — I501 Left ventricular failure: Secondary | ICD-10-CM | POA: Diagnosis present

## 2024-06-27 DIAGNOSIS — N179 Acute kidney failure, unspecified: Secondary | ICD-10-CM | POA: Diagnosis present

## 2024-06-27 DIAGNOSIS — Z86718 Personal history of other venous thrombosis and embolism: Secondary | ICD-10-CM

## 2024-06-27 DIAGNOSIS — Z8249 Family history of ischemic heart disease and other diseases of the circulatory system: Secondary | ICD-10-CM

## 2024-06-27 DIAGNOSIS — M069 Rheumatoid arthritis, unspecified: Secondary | ICD-10-CM | POA: Diagnosis present

## 2024-06-27 DIAGNOSIS — Z91041 Radiographic dye allergy status: Secondary | ICD-10-CM

## 2024-06-27 DIAGNOSIS — D631 Anemia in chronic kidney disease: Secondary | ICD-10-CM | POA: Diagnosis present

## 2024-06-27 DIAGNOSIS — Z531 Procedure and treatment not carried out because of patient's decision for reasons of belief and group pressure: Secondary | ICD-10-CM | POA: Diagnosis present

## 2024-06-27 DIAGNOSIS — Z79899 Other long term (current) drug therapy: Secondary | ICD-10-CM

## 2024-06-27 DIAGNOSIS — Z8419 Family history of other disorders of kidney and ureter: Secondary | ICD-10-CM

## 2024-06-27 DIAGNOSIS — N1831 Chronic kidney disease, stage 3a: Secondary | ICD-10-CM | POA: Diagnosis present

## 2024-06-27 DIAGNOSIS — Z79890 Hormone replacement therapy (postmenopausal): Secondary | ICD-10-CM

## 2024-06-27 DIAGNOSIS — Z91018 Allergy to other foods: Secondary | ICD-10-CM

## 2024-06-27 DIAGNOSIS — N189 Chronic kidney disease, unspecified: Secondary | ICD-10-CM

## 2024-06-27 DIAGNOSIS — M351 Other overlap syndromes: Secondary | ICD-10-CM | POA: Diagnosis present

## 2024-06-27 DIAGNOSIS — E669 Obesity, unspecified: Secondary | ICD-10-CM | POA: Diagnosis present

## 2024-06-27 DIAGNOSIS — Z823 Family history of stroke: Secondary | ICD-10-CM

## 2024-06-27 DIAGNOSIS — I82412 Acute embolism and thrombosis of left femoral vein: Secondary | ICD-10-CM

## 2024-06-27 DIAGNOSIS — I10 Essential (primary) hypertension: Secondary | ICD-10-CM

## 2024-06-27 DIAGNOSIS — Z91013 Allergy to seafood: Secondary | ICD-10-CM

## 2024-06-27 LAB — TROPONIN T, HIGH SENSITIVITY
Troponin T High Sensitivity: 43 ng/L — ABNORMAL HIGH (ref 0–19)
Troponin T High Sensitivity: 39 ng/L — ABNORMAL HIGH (ref 0–19)

## 2024-06-27 LAB — BASIC METABOLIC PANEL WITH GFR
Anion gap: 12 (ref 5–15)
BUN: 35 mg/dL — ABNORMAL HIGH (ref 8–23)
CO2: 28 mmol/L (ref 22–32)
Calcium: 8.7 mg/dL — ABNORMAL LOW (ref 8.9–10.3)
Chloride: 92 mmol/L — ABNORMAL LOW (ref 98–111)
Creatinine, Ser: 1.9 mg/dL — ABNORMAL HIGH (ref 0.44–1.00)
GFR, Estimated: 30 mL/min — ABNORMAL LOW
Glucose, Bld: 96 mg/dL (ref 70–99)
Potassium: 3.7 mmol/L (ref 3.5–5.1)
Sodium: 132 mmol/L — ABNORMAL LOW (ref 135–145)

## 2024-06-27 LAB — CBC
HCT: 21 % — ABNORMAL LOW (ref 36.0–46.0)
Hemoglobin: 7.4 g/dL — ABNORMAL LOW (ref 12.0–15.0)
MCH: 26.1 pg (ref 26.0–34.0)
MCHC: 35.2 g/dL (ref 30.0–36.0)
MCV: 73.9 fL — ABNORMAL LOW (ref 80.0–100.0)
Platelets: 262 K/uL (ref 150–400)
RBC: 2.84 MIL/uL — ABNORMAL LOW (ref 3.87–5.11)
RDW: 19.7 % — ABNORMAL HIGH (ref 11.5–15.5)
WBC: 7.7 K/uL (ref 4.0–10.5)
nRBC: 0 % (ref 0.0–0.2)

## 2024-06-27 LAB — PRO BRAIN NATRIURETIC PEPTIDE: Pro Brain Natriuretic Peptide: 2589 pg/mL — ABNORMAL HIGH

## 2024-06-27 LAB — HEPATIC FUNCTION PANEL
ALT: 40 U/L (ref 0–44)
AST: 63 U/L — ABNORMAL HIGH (ref 15–41)
Albumin: 2.8 g/dL — ABNORMAL LOW (ref 3.5–5.0)
Alkaline Phosphatase: 84 U/L (ref 38–126)
Bilirubin, Direct: 0.2 mg/dL (ref 0.0–0.2)
Indirect Bilirubin: 0.6 mg/dL (ref 0.3–0.9)
Total Bilirubin: 0.7 mg/dL (ref 0.0–1.2)
Total Protein: 7.1 g/dL (ref 6.5–8.1)

## 2024-06-27 LAB — LACTIC ACID, PLASMA: Lactic Acid, Venous: 0.8 mmol/L (ref 0.5–1.9)

## 2024-06-27 MED ORDER — METHYLPREDNISOLONE SODIUM SUCC 40 MG IJ SOLR
40.0000 mg | Freq: Once | INTRAMUSCULAR | Status: AC
  Administered 2024-06-27: 40 mg via INTRAVENOUS
  Filled 2024-06-27: qty 1

## 2024-06-27 MED ORDER — DIPHENHYDRAMINE HCL 25 MG PO CAPS: 50.0000 mg | ORAL_CAPSULE | Freq: Once | ORAL | Status: DC

## 2024-06-27 MED ORDER — DIPHENHYDRAMINE HCL 50 MG/ML IJ SOLN: 50.0000 mg | Freq: Once | INTRAMUSCULAR | Status: DC

## 2024-06-27 NOTE — ED Provider Triage Note (Signed)
 Emergency Medicine Provider Triage Evaluation Note  Evelyn Baker , a 63 y.o. female  was evaluated in triage.  Pt complains of recent admission for DVT. Chest pain starting yesterday. SOB since being dc'd from hospital. Low grade fever.   Review of Systems  Positive:  Negative:   Physical Exam  There were no vitals taken for this visit. Gen:   Awake, no distress   Resp:  Normal effort  MSK:   Moves extremities without difficulty  Other:    Medical Decision Making  Medically screening exam initiated at 3:03 PM.  Appropriate orders placed.  Evelyn Baker was informed that the remainder of the evaluation will be completed by another provider, this initial triage assessment does not replace that evaluation, and the importance of remaining in the ED until their evaluation is complete.     Hoy Nidia FALCON, [place] 06/27/24 1504

## 2024-06-27 NOTE — ED Provider Notes (Signed)
 " [place] EMERGENCY DEPARTMENT AT [facility] Provider Note   CSN: [id number] Arrival date & time: 06/27/24  1447     Patient presents with: No chief complaint on file.   Evelyn Baker is a 63 y.o. female.   Patient with.  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  Patient has had a persistent cough since her admission.  She has had increasing exertional shortness of breath.  Patient was evaluated for PE and patient with VQ scan which was negative.  She did have a CT of her abdomen pelvis which showed signs of fluid overload, small pleural effusion and a small pericardial effusion.  Echocardiogram was not performed during admission, but she does not have a history of congestive heart failure and was not discharged home on diuretic.  She reports ongoing left lower extremity swelling due to DVT, no significant right lower extremity swelling.  She sleeps sitting up in a recliner, cannot lay flat because it makes her shortness of breath a lot worse.  She also reports severe dyspnea just walking from her bed to her bathroom.        Prior to Admission medications  Medication Sig Start Date End Date Taking? Authorizing Provider  acetaminophen  (TYLENOL ) 500 MG tablet Take 2 tablets (1,000 mg total) by mouth every 8 (eight) hours as needed. 06/13/24   Sherrill Cable Latif, DO  albuterol  (VENTOLIN  HFA) 108 [phone] Base) MCG/ACT inhaler Inhale 2 puffs into the lungs every 4 (four) hours as needed for wheezing or shortness of breath. 01/22/21   Bethanie Cough, PA-C  amLODipine  (NORVASC ) 10 MG tablet Take 1 tablet (10 mg total) by mouth daily. 06/13/24   Sherrill Cable Latif, DO  apixaban  (ELIQUIS ) 5 MG TABS tablet Take 1 tablet (5 mg total) by mouth 2 (two)  times daily. 06/14/24   Pasam, Chinita, MD  Ascorbic Acid (VITAMIN C) 100 MG tablet Take 100 mg by mouth daily.    [provider]  azathioprine  (IMURAN ) 100 MG tablet Take 1 tablet (100 mg total) by mouth daily as directed 04/25/24   Dolphus Reiter, MD  budesonide -formoterol  (SYMBICORT ) 160-4.5 MCG/ACT inhaler Inhale 2 puffs into the lungs 2 (two) times daily. 06/15/24   Hunsucker, Cough SAUNDERS, MD  carvedilol  (COREG ) 6.25 MG tablet Take 1 tablet (6.25 mg total) by mouth 2 (two) times daily with a meal. 06/13/24   Sheikh, Omair Latif, DO  cyanocobalamin  1000 MCG tablet Take 1 tablet (1,000 mcg total) by mouth daily. 06/13/24   Sherrill Cable Latif, DO  EPINEPHrine  0.3 mg/0.3 mL IJ SOAJ injection Inject 0.3 mg into the muscle as needed as directed. 02/11/24     ferrous sulfate  325 (65 FE) MG tablet Take 1 tablet (325 mg total) by mouth 3 (three) times daily with meals. 06/13/24   Sherrill Cable Latif, DO  folic acid  (FOLVITE ) 1 MG tablet Take 1 tablet (1 mg total) by mouth daily. 06/14/24   Pasam, Chinita, MD  hydrALAZINE  (APRESOLINE ) 25 MG tablet Take 1 tablet (25 mg) once daily as needed for systolic blood pressure > 170 Patient taking differently: Take 25 mg by mouth daily. 06/03/24   Lelon Hamilton T, PA-C  isosorbide -hydrALAZINE  (BIDIL ) 20-37.5 MG tablet Take 1 tablet by mouth 3 (three) times daily. 06/02/24   Tolia, Sunit, DO  levothyroxine  (SYNTHROID )  112 MCG tablet Take 1 tablet (112 mcg total) by mouth in the morning on an empty stomach. 11/09/23     ondansetron  (ZOFRAN ) 4 MG tablet Take 1 tablet (4 mg total) by mouth every 6 (six) hours as needed for nausea. 06/13/24   Sherrill Cable Latif, DO  pantoprazole  (PROTONIX ) 40 MG tablet Take 1 tablet (40 mg total) by mouth 2 (two) times daily. 06/13/24   Sherrill Cable Latif, DO  potassium chloride  (MICRO-K ) 10 MEQ CR capsule Take 1 capsule (10 mEq total) by mouth 2 (two) times daily. 04/04/24   Tolia, Sunit, DO  rosuvastatin  (CRESTOR ) 10 MG  tablet Take 1 tablet (10 mg total) by mouth daily. 04/06/24 07/05/24  Tolia, Sunit, DO  senna-docusate (SENOKOT-S) 8.6-50 MG tablet Take 1 tablet by mouth at bedtime as needed for mild constipation. 06/13/24   Sherrill Cable Donovan, DO    Allergies: Shellfish allergy, Avocado, Codeine , Iodinated contrast media, Pineapple, and Penicillins    Review of Systems  Updated Vital Signs BP 130/80 (BP Location: Right Arm)   Pulse 92   Temp 98.1 F (36.7 C) (Tympanic)   Resp 18   SpO2 100%   Physical Exam Vitals and nursing note reviewed.  Constitutional:      Appearance: She is well-developed. She is not diaphoretic.  HENT:     Head: Normocephalic and atraumatic.     Right Ear: External ear normal.     Left Ear: External ear normal.     Nose: Nose normal.     Mouth/Throat:     Mouth: Mucous membranes are moist. Mucous membranes are not dry.  Eyes:     Conjunctiva/sclera: Conjunctivae normal.  Neck:     Vascular: Normal carotid pulses. JVD present.     Trachea: Trachea normal. No tracheal deviation.  Cardiovascular:     Rate and Rhythm: Normal rate and regular rhythm.     Pulses: No decreased pulses.          Radial pulses are 2+ on the right side and 2+ on the left side.     Heart sounds: Normal heart sounds, S1 normal and S2 normal. No murmur heard. Pulmonary:     Effort: Pulmonary effort is normal. No respiratory distress.     Breath sounds: Rales present. No wheezing.  Chest:     Chest wall: No tenderness.  Abdominal:     General: Bowel sounds are normal.     Palpations: Abdomen is soft.     Tenderness: There is no abdominal tenderness. There is no guarding or rebound.  Musculoskeletal:        General: Normal range of motion.     Cervical back: Normal range of motion and neck supple. No muscular tenderness.     Right lower leg: No edema.     Left lower leg: Edema present.  Skin:    General: Skin is warm and dry.     Coloration: Skin is not pale.  Neurological:     Mental  Status: She is alert.     (all labs ordered are listed, but only abnormal results are displayed) Labs Reviewed  CBC - Abnormal; Notable for the following components:      Result Value   RBC 2.84 (*)    Hemoglobin 7.4 (*)    HCT 21.0 (*)    MCV 73.9 (*)    RDW 19.7 (*)    All other components within normal limits  BASIC METABOLIC PANEL WITH GFR - Abnormal; Notable for the  following components:   Sodium 132 (*)    Chloride 92 (*)    BUN 35 (*)    Creatinine, Ser 1.90 (*)    Calcium  8.7 (*)    GFR, Estimated 30 (*)    All other components within normal limits  HEPATIC FUNCTION PANEL - Abnormal; Notable for the following components:   Albumin  2.8 (*)    AST 63 (*)    All other components within normal limits  PRO BRAIN NATRIURETIC PEPTIDE - Abnormal; Notable for the following components:   Pro Brain Natriuretic Peptide 2,589.0 (*)    All other components within normal limits  TROPONIN T, HIGH SENSITIVITY - Abnormal; Notable for the following components:   Troponin T High Sensitivity 43 (*)    All other components within normal limits  TROPONIN T, HIGH SENSITIVITY - Abnormal; Notable for the following components:   Troponin T High Sensitivity 39 (*)    All other components within normal limits  CULTURE, BLOOD (ROUTINE X 2)  CULTURE, BLOOD (ROUTINE X 2)  LACTIC ACID, PLASMA    EKG: EKG Interpretation Date/Time:  Monday June 27 2024 15:03:27 EST Ventricular Rate:  100 PR Interval:  138 QRS Duration:  64 QT Interval:  344 QTC Calculation: 443 R Axis:   26  Text Interpretation: Normal sinus rhythm Septal infarct , age undetermined Abnormal ECG When compared with ECG of 03-Jun-2024 21:48, Septal infarct is now Present Nonspecific T wave abnormality, worse in Lateral leads Confirmed by Pamella Sharper [phone]) on 06/27/2024 5:27:53 PM  Radiology: DG Chest 2 View Result Date: 06/27/2024 EXAM: 2 VIEW(S) XRAY OF THE CHEST 06/27/2024 03:49:00 PM COMPARISON: 06/05/2024  CLINICAL HISTORY: chest pain FINDINGS: LUNGS AND PLEURA: Mild pulmonary edema. New bilateral small pleural effusions with associated atelectasis. No pneumothorax. HEART AND MEDIASTINUM: Mild cardiomegaly. BONES AND SOFT TISSUES: No acute osseous abnormality. IMPRESSION: 1. Mild pulmonary edema with new bilateral small pleural effusions and associated atelectasis. 2. Mild cardiomegaly. Electronically signed by: Greig Pique MD 06/27/2024 06:05 PM EST RP Workstation: [id number]     Procedures   Medications Ordered in the ED  diphenhydrAMINE (BENADRYL) capsule 50 mg (0 mg Oral Hold 06/27/24 1827)    Or  diphenhydrAMINE (BENADRYL) injection 50 mg ( Intravenous See Alternative 06/27/24 1827)  methylPREDNISolone  sodium succinate (SOLU-MEDROL ) 40 mg/mL injection 40 mg (40 mg Intravenous Given 06/27/24 1514)   ED Course  Patient seen and examined. History obtained directly from patient.  Reviewed records from recent admission.  Labs/EKG: Reviewed lab work ordered in triage including: CBC demonstrating lower hemoglobin 7.4; BMP shows sodium of 132, chloride 92, creatinine worsening to 1.90 with a BUN of 35; troponin elevated at 43 pending second troponin; hepatic function panel with hypoalbuminemia otherwise unremarkable; lactate was normal; proBNP was added on and found to be 2589.  Imaging: Chest X-ray reviewed and interpreted, bilateral pleural effusions.  Medications/Fluids: None ordered.   Most recent vital signs reviewed and are as follows: BP 130/80 (BP Location: Right Arm)   Pulse 92   Temp 98.1 F (36.7 C) (Tympanic)   Resp 18   SpO2 100%   Initial impression: Concern for CHF, progressing, significantly symptomatic.  This is evidenced by pleural effusions on chest x-ray, pericardial effusion on bedside echo, worsening orthopnea, cough and shortness of breath with exertion, elevated proBNP.    EMERGENCY DEPARTMENT US  CARDIAC EXAM Study: Limited Ultrasound of the Heart and  Pericardium  INDICATIONS:Dyspnea Multiple views of the heart and pericardium were obtained in real-time with a multi-frequency  probe.  PERFORMED AB:Fbdzoq IMAGES ARCHIVED?: Yes LIMITATIONS:  None VIEWS USED: Parasternal long axis, Parasternal short axis, and Apical 4 chamber  INTERPRETATION: Amount of pericardial effusion small appears nearly circumferential  Patient discussed with Dr. Pamella. patient is becoming progressively more symptomatic with signs of heart failure.  She would likely benefit from echocardiogram.  Diuresis will be complicated by acute on chronic kidney injury.  For this reason, recommended inpatient admission, and after discussion with patient and husband at bedside, she agrees with this.  Awaiting chest x-ray results and then plan for admission.  6:36 PM discussed case with Dr. Dena [facility] who accepts for admission.                                  Medical Decision Making Amount and/or Complexity of Data Reviewed Labs: ordered. Radiology: ordered.  Risk Decision regarding hospitalization.   Patient presents with worsening dyspnea with exertion, some dyspnea at rest, orthopnea, signs of heart failure evidenced by increased BNP, pleural effusions on chest x-ray and crackles on exam.  She has worsening renal function since discharge earlier in the month.  She is stable now, but will need admission for heart failure evaluation, possible diuresis in setting of labile renal function.     Final diagnoses:  Congestive heart failure, unspecified HF chronicity, unspecified heart failure type (HCC)  Acute renal failure superimposed on chronic kidney disease, unspecified acute renal failure type, unspecified CKD stage    ED Discharge Orders     None          Desiderio Chew, PA-C 06/27/24 1837  "

## 2024-06-27 NOTE — ED Triage Notes (Addendum)
 Recent 10 day admission for DVT. Sent home on eliquis . 12/15 DC and SOB since. Was anemic and given EPO and iron  infusions. SOB worsening. Low grade fever. Developed chest pressure/pain yesterday.

## 2024-06-28 ENCOUNTER — Telehealth: Payer: Self-pay

## 2024-06-28 ENCOUNTER — Encounter (HOSPITAL_BASED_OUTPATIENT_CLINIC_OR_DEPARTMENT_OTHER): Payer: Self-pay | Admitting: Internal Medicine

## 2024-06-28 DIAGNOSIS — Z6831 Body mass index (BMI) 31.0-31.9, adult: Secondary | ICD-10-CM | POA: Diagnosis not present

## 2024-06-28 DIAGNOSIS — N179 Acute kidney failure, unspecified: Secondary | ICD-10-CM | POA: Diagnosis present

## 2024-06-28 DIAGNOSIS — I5033 Acute on chronic diastolic (congestive) heart failure: Secondary | ICD-10-CM | POA: Diagnosis present

## 2024-06-28 DIAGNOSIS — Z79899 Other long term (current) drug therapy: Secondary | ICD-10-CM | POA: Diagnosis not present

## 2024-06-28 DIAGNOSIS — D631 Anemia in chronic kidney disease: Secondary | ICD-10-CM | POA: Diagnosis present

## 2024-06-28 DIAGNOSIS — I82412 Acute embolism and thrombosis of left femoral vein: Secondary | ICD-10-CM | POA: Diagnosis not present

## 2024-06-28 DIAGNOSIS — N1831 Chronic kidney disease, stage 3a: Secondary | ICD-10-CM | POA: Diagnosis present

## 2024-06-28 DIAGNOSIS — J45909 Unspecified asthma, uncomplicated: Secondary | ICD-10-CM | POA: Diagnosis present

## 2024-06-28 DIAGNOSIS — Z79624 Long term (current) use of inhibitors of nucleotide synthesis: Secondary | ICD-10-CM | POA: Diagnosis not present

## 2024-06-28 DIAGNOSIS — M351 Other overlap syndromes: Secondary | ICD-10-CM | POA: Diagnosis present

## 2024-06-28 DIAGNOSIS — I509 Heart failure, unspecified: Secondary | ICD-10-CM | POA: Diagnosis not present

## 2024-06-28 DIAGNOSIS — I501 Left ventricular failure: Secondary | ICD-10-CM | POA: Diagnosis present

## 2024-06-28 DIAGNOSIS — Z7951 Long term (current) use of inhaled steroids: Secondary | ICD-10-CM | POA: Diagnosis not present

## 2024-06-28 DIAGNOSIS — Z86718 Personal history of other venous thrombosis and embolism: Secondary | ICD-10-CM | POA: Diagnosis not present

## 2024-06-28 DIAGNOSIS — Z91013 Allergy to seafood: Secondary | ICD-10-CM | POA: Diagnosis not present

## 2024-06-28 DIAGNOSIS — Z7901 Long term (current) use of anticoagulants: Secondary | ICD-10-CM | POA: Diagnosis not present

## 2024-06-28 DIAGNOSIS — Z91018 Allergy to other foods: Secondary | ICD-10-CM | POA: Diagnosis not present

## 2024-06-28 DIAGNOSIS — Z8249 Family history of ischemic heart disease and other diseases of the circulatory system: Secondary | ICD-10-CM | POA: Diagnosis not present

## 2024-06-28 DIAGNOSIS — I130 Hypertensive heart and chronic kidney disease with heart failure and stage 1 through stage 4 chronic kidney disease, or unspecified chronic kidney disease: Secondary | ICD-10-CM | POA: Diagnosis present

## 2024-06-28 DIAGNOSIS — E039 Hypothyroidism, unspecified: Secondary | ICD-10-CM | POA: Diagnosis present

## 2024-06-28 DIAGNOSIS — E669 Obesity, unspecified: Secondary | ICD-10-CM | POA: Diagnosis present

## 2024-06-28 DIAGNOSIS — I5031 Acute diastolic (congestive) heart failure: Secondary | ICD-10-CM | POA: Diagnosis not present

## 2024-06-28 DIAGNOSIS — I3139 Other pericardial effusion (noninflammatory): Secondary | ICD-10-CM | POA: Diagnosis present

## 2024-06-28 DIAGNOSIS — Z79890 Hormone replacement therapy (postmenopausal): Secondary | ICD-10-CM | POA: Diagnosis not present

## 2024-06-28 DIAGNOSIS — N189 Chronic kidney disease, unspecified: Secondary | ICD-10-CM | POA: Diagnosis not present

## 2024-06-28 DIAGNOSIS — E871 Hypo-osmolality and hyponatremia: Secondary | ICD-10-CM | POA: Diagnosis present

## 2024-06-28 DIAGNOSIS — M069 Rheumatoid arthritis, unspecified: Secondary | ICD-10-CM | POA: Diagnosis present

## 2024-06-28 DIAGNOSIS — E785 Hyperlipidemia, unspecified: Secondary | ICD-10-CM | POA: Diagnosis present

## 2024-06-28 DIAGNOSIS — Z8673 Personal history of transient ischemic attack (TIA), and cerebral infarction without residual deficits: Secondary | ICD-10-CM | POA: Diagnosis not present

## 2024-06-28 MED ORDER — ONDANSETRON HCL 4 MG PO TABS: 4.0000 mg | ORAL_TABLET | Freq: Four times a day (QID) | ORAL | Status: DC | PRN

## 2024-06-28 MED ORDER — ACETAMINOPHEN 650 MG RE SUPP: 650.0000 mg | Freq: Four times a day (QID) | RECTAL | Status: DC | PRN

## 2024-06-28 MED ORDER — FUROSEMIDE 10 MG/ML IJ SOLN
20.0000 mg | Freq: Two times a day (BID) | INTRAMUSCULAR | Status: DC
  Administered 2024-06-28 – 2024-07-01 (×6): 20 mg via INTRAVENOUS
  Filled 2024-06-28 (×6): qty 2

## 2024-06-28 MED ORDER — FERROUS SULFATE 325 (65 FE) MG PO TABS
325.0000 mg | ORAL_TABLET | Freq: Three times a day (TID) | ORAL | Status: DC
  Administered 2024-06-28 – 2024-07-06 (×24): 325 mg via ORAL
  Filled 2024-06-28 (×25): qty 1

## 2024-06-28 MED ORDER — FLUTICASONE FUROATE-VILANTEROL 200-25 MCG/ACT IN AEPB
1.0000 | INHALATION_SPRAY | Freq: Every day | RESPIRATORY_TRACT | Status: DC
  Administered 2024-06-29: 1 via RESPIRATORY_TRACT

## 2024-06-28 MED ORDER — LEVOTHYROXINE SODIUM 112 MCG PO TABS
112.0000 ug | ORAL_TABLET | Freq: Every morning | ORAL | Status: DC
  Administered 2024-06-29 – 2024-07-06 (×8): 112 ug via ORAL
  Filled 2024-06-28 (×8): qty 1

## 2024-06-28 MED ORDER — APIXABAN 5 MG PO TABS
5.0000 mg | ORAL_TABLET | Freq: Two times a day (BID) | ORAL | Status: DC
  Administered 2024-06-28 – 2024-06-30 (×6): 5 mg via ORAL
  Filled 2024-06-28 (×7): qty 1

## 2024-06-28 MED ORDER — ONDANSETRON HCL 4 MG/2ML IJ SOLN: 4.0000 mg | Freq: Four times a day (QID) | INTRAMUSCULAR | Status: DC | PRN

## 2024-06-28 MED ORDER — AZATHIOPRINE 50 MG PO TABS
100.0000 mg | ORAL_TABLET | Freq: Every day | ORAL | Status: DC
  Administered 2024-06-29 – 2024-07-06 (×8): 100 mg via ORAL
  Filled 2024-06-28 (×8): qty 2

## 2024-06-28 MED ORDER — SODIUM CHLORIDE 0.9 % IV SOLN: 250.0000 mL | INTRAVENOUS | Status: DC | PRN

## 2024-06-28 MED ORDER — PANTOPRAZOLE SODIUM 40 MG PO TBEC
40.0000 mg | DELAYED_RELEASE_TABLET | Freq: Two times a day (BID) | ORAL | Status: DC
  Administered 2024-06-28 – 2024-07-06 (×17): 40 mg via ORAL
  Filled 2024-06-28 (×17): qty 1

## 2024-06-28 MED ORDER — ALBUTEROL SULFATE (2.5 MG/3ML) 0.083% IN NEBU: 2.5000 mg | INHALATION_SOLUTION | RESPIRATORY_TRACT | Status: DC | PRN

## 2024-06-28 MED ORDER — SODIUM CHLORIDE 0.9% FLUSH: 3.0000 mL | Freq: Two times a day (BID) | INTRAVENOUS | Status: DC

## 2024-06-28 MED ORDER — SENNOSIDES-DOCUSATE SODIUM 8.6-50 MG PO TABS: 1.0000 | ORAL_TABLET | Freq: Every evening | ORAL | Status: DC | PRN

## 2024-06-28 MED ORDER — ACETAMINOPHEN 325 MG PO TABS
650.0000 mg | ORAL_TABLET | Freq: Four times a day (QID) | ORAL | Status: DC | PRN
  Administered 2024-06-30: 650 mg via ORAL
  Filled 2024-06-28: qty 2

## 2024-06-28 MED ORDER — HYDRALAZINE HCL 25 MG PO TABS: 25.0000 mg | ORAL_TABLET | Freq: Every day | ORAL | Status: DC | PRN

## 2024-06-28 MED ORDER — AMLODIPINE BESYLATE 5 MG PO TABS
10.0000 mg | ORAL_TABLET | Freq: Every day | ORAL | Status: DC
  Administered 2024-06-28: 10 mg via ORAL
  Filled 2024-06-28: qty 2

## 2024-06-28 MED ORDER — SENNA 8.6 MG PO TABS
1.0000 | ORAL_TABLET | Freq: Two times a day (BID) | ORAL | Status: DC
  Administered 2024-06-28 – 2024-07-01 (×5): 8.6 mg via ORAL
  Filled 2024-06-28 (×14): qty 1

## 2024-06-28 MED ORDER — SODIUM CHLORIDE 0.9% FLUSH: 3.0000 mL | INTRAVENOUS | Status: DC | PRN

## 2024-06-28 MED ORDER — FOLIC ACID 1 MG PO TABS
1.0000 mg | ORAL_TABLET | Freq: Every day | ORAL | Status: DC
  Administered 2024-06-28 – 2024-07-06 (×9): 1 mg via ORAL
  Filled 2024-06-28 (×9): qty 1

## 2024-06-28 MED ORDER — CARVEDILOL 6.25 MG PO TABS
6.2500 mg | ORAL_TABLET | Freq: Two times a day (BID) | ORAL | Status: DC
  Administered 2024-06-28 – 2024-07-06 (×17): 6.25 mg via ORAL
  Filled 2024-06-28 (×17): qty 1

## 2024-06-28 MED ORDER — LEVOTHYROXINE SODIUM 112 MCG PO TABS: 112.0000 ug | ORAL_TABLET | Freq: Every morning | ORAL | Status: DC

## 2024-06-28 MED ORDER — ALBUTEROL SULFATE HFA 108 (90 BASE) MCG/ACT IN AERS: 2.0000 | INHALATION_SPRAY | RESPIRATORY_TRACT | Status: DC | PRN

## 2024-06-28 MED ORDER — ISOSORB DINITRATE-HYDRALAZINE 20-37.5 MG PO TABS
1.0000 | ORAL_TABLET | Freq: Three times a day (TID) | ORAL | Status: DC
  Filled 2024-06-28 (×14): qty 1

## 2024-06-28 MED ORDER — ACETAMINOPHEN 500 MG PO TABS: 1000.0000 mg | ORAL_TABLET | Freq: Three times a day (TID) | ORAL | Status: DC | PRN

## 2024-06-28 NOTE — Telephone Encounter (Signed)
 Followed up with patient per Dr. Autumn to advise her to seek treatment from her PCP for her linger cough. While speaking to her on the phone, she reported that she was currently at the [facility] ED awaiting an in-patient bed. Pt reports she needs ECHO study and has fluid in lungs.

## 2024-06-28 NOTE — ED Notes (Signed)
 Introduce myself to pt and her husband , room straighten and pt up to [facility] ambulatory by self , given coffee to her , and her husband

## 2024-06-28 NOTE — ED Notes (Signed)
Iona Beard with cl called for transport

## 2024-06-28 NOTE — H&P (Signed)
 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 Hematology saw her during that admission, and she was dosed with IV Fe and epo.  She returned to [facility] 12/29 reporting a persistent cough since her discharge with gradually increasing exertional shortness of breath and severe orthopnea.   Assessment/Plan  Acute/subacute pulmonary edema of unclear etiology - suspected newly diagnosed CHF  Diurese - monitor Is/Os - check TTE - monitor Hgb trend - follow BNP trend   L femoral vein DVT No evidence of PE during recent admit - continue anticoag w/ Eliquis  - pt has noted signif decrease in the swelling of her L LE while on anticoag  AKI on CKD IIIa Will need to monitor crt in serial fashion while being diuresed - I am hoping renal function will improve with diuresis, but if it worsens we may be forced to stop lasix  in short course  Hyponatremia Possibly due to volume overload - follow trend w/ diuresis   HTN BP controlled at present - follow w/ diuresis   HLD Continue usual home medical tx  Hypothyroidism  Continue usual home medical tx  MCTD On chronic azathioprine    DVT prophylaxis: Eliquis   Code Status:   Code Status: Full Code Family Communication: spoke w/ husband at bedside  Disposition Plan:  Admit to Inpatient   Review of Systems: As per HPI otherwise 10 point review of systems negative.   Past Medical History:  Diagnosis Date   Asthma    Depression    Dyspnea    Hyperlipidemia    Hypertension    Hypothyroidism     Sciatic nerve disease    Thyroid  disease     Past Surgical History:  Procedure Laterality Date   FOOT SURGERY Left    STRABISMUS SURGERY     age 50   TOTAL ABDOMINAL HYSTERECTOMY     VEIN LIGATION AND STRIPPING Left 01/22/2021   Procedure: LEFT GREATER SAPHENOUS VEIN  LIGATION AND STRIPPING;  Surgeon: Harvey Carlin BRAVO, MD;  Location: [facility] OR;  Service: Vascular;  Laterality: Left;    Family History  Family History  Problem Relation Age of Onset   Heart failure Mother    Kidney failure Mother    Stroke Sister    Aneurysm Sister    Heart Problems Brother    Hypertension Son    Hypertension Daughter     Social History   reports that she has never smoked. She has never been exposed to tobacco smoke. She has never used smokeless tobacco. She reports that she does not drink alcohol and does not use drugs.  Allergies Allergies[1]  Prior to Admission medications  Medication Sig Start Date End Date Taking? Authorizing Provider  acetaminophen  (TYLENOL ) 500 MG tablet Take 2 tablets (1,000 mg total) by mouth every 8 (eight) hours as needed. 06/13/24   Sherrill Cable Latif, DO  albuterol  (VENTOLIN  HFA) 108 [phone] Base) MCG/ACT inhaler Inhale 2 puffs into the lungs every 4 (four) hours as needed for wheezing or shortness of breath. 01/22/21   Bethanie Cough, PA-C  amLODipine  (NORVASC ) 10 MG tablet  Take 1 tablet (10 mg total) by mouth daily. 06/13/24   Sherrill Cable Latif, DO  apixaban  (ELIQUIS ) 5 MG TABS tablet Take 1 tablet (5 mg total) by mouth 2 (two) times daily. 06/14/24   Pasam, Chinita, MD  Ascorbic Acid (VITAMIN C) 100 MG tablet Take 100 mg by mouth daily.    [provider]  azathioprine  (IMURAN ) 100 MG tablet Take 1 tablet (100 mg total) by mouth daily as directed 04/25/24   Dolphus Reiter, MD  budesonide -formoterol  (SYMBICORT ) 160-4.5 MCG/ACT inhaler Inhale 2 puffs into the lungs 2 (two) times daily. 06/15/24   Hunsucker, Donnice SAUNDERS, MD  carvedilol  (COREG ) 6.25 MG tablet  Take 1 tablet (6.25 mg total) by mouth 2 (two) times daily with a meal. 06/13/24   Sheikh, Omair Latif, DO  cyanocobalamin  1000 MCG tablet Take 1 tablet (1,000 mcg total) by mouth daily. 06/13/24   Sherrill Cable Latif, DO  EPINEPHrine  0.3 mg/0.3 mL IJ SOAJ injection Inject 0.3 mg into the muscle as needed as directed. 02/11/24     ferrous sulfate  325 (65 FE) MG tablet Take 1 tablet (325 mg total) by mouth 3 (three) times daily with meals. 06/13/24   Sherrill Cable Latif, DO  folic acid  (FOLVITE ) 1 MG tablet Take 1 tablet (1 mg total) by mouth daily. 06/14/24   Pasam, Chinita, MD  hydrALAZINE  (APRESOLINE ) 25 MG tablet Take 1 tablet (25 mg) once daily as needed for systolic blood pressure > 170 Patient taking differently: Take 25 mg by mouth daily. 06/03/24   Lelon Hamilton T, PA-C  isosorbide -hydrALAZINE  (BIDIL ) 20-37.5 MG tablet Take 1 tablet by mouth 3 (three) times daily. 06/02/24   Tolia, Sunit, DO  levothyroxine  (SYNTHROID ) 112 MCG tablet Take 1 tablet (112 mcg total) by mouth in the morning on an empty stomach. 11/09/23     ondansetron  (ZOFRAN ) 4 MG tablet Take 1 tablet (4 mg total) by mouth every 6 (six) hours as needed for nausea. 06/13/24   Sherrill Cable Latif, DO  pantoprazole  (PROTONIX ) 40 MG tablet Take 1 tablet (40 mg total) by mouth 2 (two) times daily. 06/13/24   Sheikh, Cable Latif, DO  potassium chloride  (MICRO-K ) 10 MEQ CR capsule Take 1 capsule (10 mEq total) by mouth 2 (two) times daily. 04/04/24   Tolia, Sunit, DO  rosuvastatin  (CRESTOR ) 10 MG tablet Take 1 tablet (10 mg total) by mouth daily. 04/06/24 07/05/24  Tolia, Sunit, DO  senna-docusate (SENOKOT-S) 8.6-50 MG tablet Take 1 tablet by mouth at bedtime as needed for mild constipation. 06/13/24   Sherrill Cable Donovan, DO    Physical Exam: Vitals:   06/28/24 1100 06/28/24 1145 06/28/24 1223 06/28/24 1533  BP: 116/65 129/73 129/73 (!) 141/92  Pulse: 78 72 77 83  Resp: 19 20 (!) 21 18  Temp:   98.4 F (36.9 C) 97.8 F (36.6 C)   TempSrc:   Oral Oral  SpO2: 98% 97% 100% 100%  Weight:    88.5 kg  Height:    5' 5 (1.651 m)    General: mild dyspnea at rest - not in extremis  Lungs: fine crackles diffusely, worse in B lower fields - no wheezing  Cardiovascular: Regular rate and rhythm without murmur gallop or rub normal S1 and S2 Abdomen: Nontender, nondistended, soft, bowel sounds positive, no rebound, no ascites, no appreciable mass Extremities: 1+ B LE edema but not worse on L v/s R despite known hx of DVT    Labs on Admission:   CBC: Recent Labs  Lab  06/27/24 1501  WBC 7.7  HGB 7.4*  HCT 21.0*  MCV 73.9*  PLT 262   Basic Metabolic Panel: Recent Labs  Lab 06/27/24 1502  NA 132*  K 3.7  CL 92*  CO2 28  GLUCOSE 96  BUN 35*  CREATININE 1.90*  CALCIUM  8.7*   GFR: Estimated Creatinine Clearance: 34.2 mL/min (A) (by C-G formula based on SCr of 1.9 mg/dL (H)).  Liver Function Tests: Recent Labs  Lab 06/27/24 1502  AST 63*  ALT 40  ALKPHOS 84  BILITOT 0.7  PROT 7.1  ALBUMIN  2.8*    Radiological Exams on Admission: DG Chest 2 View Result Date: 06/27/2024 IMPRESSION: 1. Mild pulmonary edema with new bilateral small pleural effusions and associated atelectasis. 2. Mild cardiomegaly. Electronically signed by: Greig Pique MD 06/27/2024 06:05 PM EST RP Workstation: [id number]    Reyes IVAR Moores, MD [facility] Office  [phone] Pager - Text Page per Amion as per below:  On-Call/Text Page:      [email or web address]  If 7PM-7AM, please contact night-coverage [email or web address] 06/28/2024, 4:21 PM        [1]  Allergies Allergen Reactions   Shellfish Allergy Anaphylaxis and Swelling   Avocado Swelling    Lips   Codeine  Other (See Comments)    Constipation   Iodinated Contrast Media Other (See Comments)   Mushroom Other (See Comments)    Itchy throat   Pineapple Swelling    Lip swelling    Penicillins Rash    Reaction: Childhood   "

## 2024-06-28 NOTE — ED Notes (Signed)
 Pt requesting to hold ferrous sulfate  tab until husband brings food. States approx 15 min.

## 2024-06-28 NOTE — ED Notes (Signed)
 Some of pts meds we do not carry here

## 2024-06-28 NOTE — Progress Notes (Signed)
 Admitted to [facility] room 3E 24.  Placed on cardiac monitor, weight obtained, vitals obtained and CHG bath completed.     06/28/24 1533  Vitals  Temp 97.8 F (36.6 C)  Temp Source Oral  BP (!) 141/92  MAP (mmHg) 106  BP Location Right Arm  BP Method Automatic  Patient Position (if appropriate) Sitting  Pulse Rate 83  Pulse Rate Source Monitor  ECG Heart Rate 87  Resp 18  Level of Consciousness  Level of Consciousness Alert  MEWS COLOR  MEWS Score Color Green  Oxygen Therapy  SpO2 100 %  O2 Device Room Air  Pain Assessment  Pain Scale 0-10  Pain Score 0  Height and Weight  Height 5' 5 (1.651 m)  Weight 88.5 kg  Type of Scale Used Standing  Type of Weight Actual  BSA (Calculated - sq m) 2.01 sq meters  BMI (Calculated) 32.47  Weight in (lb) to have BMI = 25 149.9  ECG Monitoring  Telemetry Box Number [id number]  Tele Box Verification Completed by Second Verifier Completed Lino O. RN)  MEWS Score  MEWS Temp 0  MEWS Systolic 0  MEWS Pulse 0  MEWS RR 0  MEWS LOC 0  MEWS Score 0

## 2024-06-28 NOTE — ED Notes (Signed)
"   Not have her 112 mcg of synthroid  her or he breathing tx meds "

## 2024-06-28 NOTE — Telephone Encounter (Signed)
 Dr Annella,   Please advise mychart message as pt last saw you.

## 2024-06-29 ENCOUNTER — Other Ambulatory Visit (HOSPITAL_COMMUNITY): Payer: Self-pay

## 2024-06-29 ENCOUNTER — Inpatient Hospital Stay (HOSPITAL_COMMUNITY)

## 2024-06-29 DIAGNOSIS — I5031 Acute diastolic (congestive) heart failure: Secondary | ICD-10-CM | POA: Diagnosis not present

## 2024-06-29 DIAGNOSIS — I3139 Other pericardial effusion (noninflammatory): Secondary | ICD-10-CM

## 2024-06-29 DIAGNOSIS — I509 Heart failure, unspecified: Secondary | ICD-10-CM | POA: Diagnosis not present

## 2024-06-29 LAB — ECHOCARDIOGRAM COMPLETE
Area-P 1/2: 3.72 cm2
Calc EF: 54.5 %
Height: 65 in
S' Lateral: 2.7 cm
Single Plane A2C EF: 52.1 %
Single Plane A4C EF: 56.8 %
Weight: 3089.97 [oz_av]

## 2024-06-29 LAB — CBC
HCT: 21.1 % — ABNORMAL LOW (ref 36.0–46.0)
Hemoglobin: 7.5 g/dL — ABNORMAL LOW (ref 12.0–15.0)
MCH: 25.9 pg — ABNORMAL LOW (ref 26.0–34.0)
MCHC: 35.5 g/dL (ref 30.0–36.0)
MCV: 72.8 fL — ABNORMAL LOW (ref 80.0–100.0)
Platelets: 339 K/uL (ref 150–400)
RBC: 2.9 MIL/uL — ABNORMAL LOW (ref 3.87–5.11)
RDW: 19.4 % — ABNORMAL HIGH (ref 11.5–15.5)
WBC: 10.8 K/uL — ABNORMAL HIGH (ref 4.0–10.5)
nRBC: 0 % (ref 0.0–0.2)

## 2024-06-29 LAB — COMPREHENSIVE METABOLIC PANEL WITH GFR
ALT: 51 U/L — ABNORMAL HIGH (ref 0–44)
AST: 74 U/L — ABNORMAL HIGH (ref 15–41)
Albumin: 2.7 g/dL — ABNORMAL LOW (ref 3.5–5.0)
Alkaline Phosphatase: 73 U/L (ref 38–126)
Anion gap: 12 (ref 5–15)
BUN: 44 mg/dL — ABNORMAL HIGH (ref 8–23)
CO2: 28 mmol/L (ref 22–32)
Calcium: 8.8 mg/dL — ABNORMAL LOW (ref 8.9–10.3)
Chloride: 95 mmol/L — ABNORMAL LOW (ref 98–111)
Creatinine, Ser: 1.6 mg/dL — ABNORMAL HIGH (ref 0.44–1.00)
GFR, Estimated: 36 mL/min — ABNORMAL LOW
Glucose, Bld: 120 mg/dL — ABNORMAL HIGH (ref 70–99)
Potassium: 3.4 mmol/L — ABNORMAL LOW (ref 3.5–5.1)
Sodium: 134 mmol/L — ABNORMAL LOW (ref 135–145)
Total Bilirubin: 0.3 mg/dL (ref 0.0–1.2)
Total Protein: 6.6 g/dL (ref 6.5–8.1)

## 2024-06-29 LAB — SEDIMENTATION RATE: Sed Rate: 88 mm/h — ABNORMAL HIGH (ref 0–22)

## 2024-06-29 LAB — C-REACTIVE PROTEIN: CRP: 8.5 mg/dL — ABNORMAL HIGH

## 2024-06-29 LAB — PRO BRAIN NATRIURETIC PEPTIDE: Pro Brain Natriuretic Peptide: 1724 pg/mL — ABNORMAL HIGH

## 2024-06-29 MED ORDER — POTASSIUM CHLORIDE 20 MEQ PO PACK
40.0000 meq | PACK | Freq: Once | ORAL | Status: AC
  Administered 2024-06-29: 40 meq via ORAL
  Filled 2024-06-29: qty 2

## 2024-06-29 NOTE — Progress Notes (Signed)
 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 Hematology saw her during that admission, and she was dosed with IV Fe and epo.  She returned to [facility] 06/27/24 reporting a persistent cough since her discharge with gradually increasing exertional shortness of breath and severe orthopnea.  Patient was admitted for acute pulmonary edema started on IV Lasix .  Assessment & Plan:   Principal Problem:   CHF exacerbation (HCC) Active Problems:   Pulmonary edema cardiac cause (HCC)   Acute / Subacute pulmonary edema of unclear etiology: Pericardial Effusion : Suspected newly diagnosed CHF. Continue to diurese with Lasix  20 mg IV q 12hr. Monitor daily weight, intake output charting. Monitor hemoglobin trend, follow BMP trend. Echocardiogram shows LVEF 60 to 65%, large pericardial effusion but no tamponade signs. Patient is hemodynamically stable,  no signs of tamponade. Cardiology is consulted.   Left femoral vein DVT: No evidence of PE during recent admission. Continue anticoagulation w/ Eliquis  . Patient has noted significant decrease in the swelling of her L LE while on anticoaguation.   AKI on CKD IIIa Creatinine has improved while being on diuresis.  Continue to monitor serum creatinine, avoid nephrotoxic medications.   Hyponatremia: Possibly due to volume overload - Follow trend w/ diuresis    HTN: BP controlled at present - follow w/ diuresis.   Hyperlipidemia: Continue home medications.   Hypothyroidism: Continue levothyroxine .   MCTD: On chronic azathioprine .  Hypochromic normocytic anemia: Continue iron   supplementation.  DVT prophylaxis: Eliquis  Code Status:Full code Family Communication: Husband at bed side Disposition Plan:     Status is: Inpatient Remains inpatient appropriate because: Patient admitted for CHF exacerbation,  also have anemia.  Patient is not medically ready for discharge.     Consultants:  None  Procedures: Echocardiogram.  Antimicrobials: Anti-infectives (From admission, onward)    None      Subjective: Patient was seen and examined at bedside.  Overnight events noted. Patient was sitting comfortably,  denies any chest pain,  shortness of breath.   She is hemodynamically stable.  Objective: Vitals:   06/28/24 2350 06/29/24 0415 06/29/24 0625 06/29/24 0835  BP: 126/75  136/80 130/75  Pulse: 81  90 90  Resp: 18 19 18 20   Temp: 98 F (36.7 C) 97.9 F (36.6 C) 98 F (36.7 C) (!) 97.4 F (36.3 C)  TempSrc: Temporal Oral Temporal Oral  SpO2: 97%  97% 99%  Weight:   87.6 kg   Height:        Intake/Output Summary (Last 24 hours) at 06/29/2024 1228 Last data filed at 06/29/2024 0600 Gross per 24 hour  Intake 957 ml  Output 1500 ml  Net -543 ml   Filed Weights   06/28/24 1533 06/29/24 0625  Weight: 88.5 kg 87.6 kg    Examination:  General exam: Appears calm and comfortable, not in any acute distress. Respiratory system: CTA Bilaterally. Respiratory effort normal. RR 16 Cardiovascular system: S1 & S2 heard, RRR. No JVD, murmurs, rubs, gallops or clicks. No pedal edema. Gastrointestinal system: Abdomen is non distended, soft and non tender.  Normal bowel sounds heard. Central nervous system: Alert and oriented x 3. No focal neurological  deficits. Extremities: Edema+, no cyanosis, no clubbing. Skin: No rashes, lesions or ulcers Psychiatry: Judgement and insight appear normal. Mood & affect appropriate.    Data Reviewed: I have personally reviewed following labs and imaging studies  CBC: Recent Labs  Lab 06/27/24 1501 06/29/24 0339   WBC 7.7 10.8*  HGB 7.4* 7.5*  HCT 21.0* 21.1*  MCV 73.9* 72.8*  PLT 262 339   Basic Metabolic Panel: Recent Labs  Lab 06/27/24 1502 06/29/24 0339  NA 132* 134*  K 3.7 3.4*  CL 92* 95*  CO2 28 28  GLUCOSE 96 120*  BUN 35* 44*  CREATININE 1.90* 1.60*  CALCIUM  8.7* 8.8*   GFR: Estimated Creatinine Clearance: 40.3 mL/min (A) (by C-G formula based on SCr of 1.6 mg/dL (H)). Liver Function Tests: Recent Labs  Lab 06/27/24 1502 06/29/24 0339  AST 63* 74*  ALT 40 51*  ALKPHOS 84 73  BILITOT 0.7 0.3  PROT 7.1 6.6  ALBUMIN  2.8* 2.7*   No results for input(s): LIPASE, AMYLASE in the last 168 hours. No results for input(s): AMMONIA in the last 168 hours. Coagulation Profile: No results for input(s): INR, PROTIME in the last 168 hours. Cardiac Enzymes: No results for input(s): CKTOTAL, CKMB, CKMBINDEX, TROPONINI in the last 168 hours. BNP (last 3 results) Recent Labs    06/27/24 1506 06/29/24 0339  PROBNP 2,589.0* 1,724.0*   HbA1C: No results for input(s): HGBA1C in the last 72 hours. CBG: No results for input(s): GLUCAP in the last 168 hours. Lipid Profile: No results for input(s): CHOL, HDL, LDLCALC, TRIG, CHOLHDL, LDLDIRECT in the last 72 hours. Thyroid  Function Tests: No results for input(s): TSH, T4TOTAL, FREET4, T3FREE, THYROIDAB in the last 72 hours. Anemia Panel: No results for input(s): VITAMINB12, FOLATE, FERRITIN, TIBC, IRON , RETICCTPCT in the last 72 hours. Sepsis Labs: Recent Labs  Lab 06/27/24 1506  LATICACIDVEN 0.8    Recent Results (from the past 240 hours)  Blood culture (routine x 2)     Status: None (Preliminary result)   Collection Time: 06/27/24  3:06 PM   Specimen: BLOOD  Result Value Ref Range Status   Specimen Description   Final    BLOOD LEFT ANTECUBITAL Performed at [facility], [address] [place], [place] [address]    Special Requests   Final     Blood Culture results may not be optimal due to an inadequate volume of blood received in culture bottles BOTTLES DRAWN AEROBIC AND ANAEROBIC Performed at [facility], [address], [place], [place] [address]    Culture   Final    NO GROWTH 2 DAYS Performed at [facility], [address], [place], [place] [address]    Report Status PENDING  Incomplete    Radiology Studies: ECHOCARDIOGRAM COMPLETE Result Date: 06/29/2024    ECHOCARDIOGRAM REPORT   Patient Name:   BLANCHE SCOVELL Date of Exam: 06/29/2024 Medical Rec #:  [id number]       Height:       65.0 in Accession #:    [id number]      Weight:       193.1 lb Date of Birth:  07/25/1960       BSA:          1.949 m Patient Age:    63 years        BP:           136/80 mmHg Patient Gender: F  HR:           80 bpm. Exam Location:  Inpatient Procedure: 2D Echo, Cardiac Doppler and Color Doppler (Both Spectral and Color            Flow Doppler were utilized during procedure). Indications:    CHF - Acute Diastolic  History:        Patient has prior history of Echocardiogram examinations, most                 recent 03/17/2023. CHF, CAD, Signs/Symptoms:Edema, Shortness of                 Breath and Dyspnea; Risk Factors:Hypertension and Dyslipidemia.  Sonographer:    Juliene Rucks Referring Phys: [id number] JEFFREY T [facility]  Sonographer Comments: Patient is obese. IMPRESSIONS  1. Large pericardial effusion; IVC not dilated and no RV diastolic collapse to suggest tamponade.  2. Left ventricular ejection fraction, by estimation, is 60 to 65%. The left ventricle has normal function. The left ventricle has no regional wall motion abnormalities. There is mild left ventricular hypertrophy. Left ventricular diastolic parameters are consistent with Grade I diastolic dysfunction (impaired relaxation).  3. Right ventricular systolic function is normal. The right ventricular size is normal.  4. Large pericardial effusion. There is no  evidence of cardiac tamponade.  5. The mitral valve is normal in structure. Trivial mitral valve regurgitation. No evidence of mitral stenosis.  6. The aortic valve is tricuspid. Aortic valve regurgitation is not visualized. No aortic stenosis is present.  7. The inferior vena cava is normal in size with greater than 50% respiratory variability, suggesting right atrial pressure of 3 mmHg. FINDINGS  Left Ventricle: Left ventricular ejection fraction, by estimation, is 60 to 65%. The left ventricle has normal function. The left ventricle has no regional wall motion abnormalities. The left ventricular internal cavity size was normal in size. There is  mild left ventricular hypertrophy. Left ventricular diastolic parameters are consistent with Grade I diastolic dysfunction (impaired relaxation). Right Ventricle: The right ventricular size is normal. Right ventricular systolic function is normal. Left Atrium: Left atrial size was normal in size. Right Atrium: Right atrial size was normal in size. Pericardium: A large pericardial effusion is present. There is no evidence of cardiac tamponade. Mitral Valve: The mitral valve is normal in structure. Trivial mitral valve regurgitation. No evidence of mitral valve stenosis. Tricuspid Valve: The tricuspid valve is normal in structure. Tricuspid valve regurgitation is trivial. No evidence of tricuspid stenosis. Aortic Valve: The aortic valve is tricuspid. Aortic valve regurgitation is not visualized. No aortic stenosis is present. Pulmonic Valve: The pulmonic valve was normal in structure. Pulmonic valve regurgitation is trivial. No evidence of pulmonic stenosis. Aorta: The aortic root is normal in size and structure. Venous: The inferior vena cava is normal in size with greater than 50% respiratory variability, suggesting right atrial pressure of 3 mmHg. IAS/Shunts: No atrial level shunt detected by color flow Doppler. Additional Comments: Large pericardial effusion; IVC not  dilated and no RV diastolic collapse to suggest tamponade.  LEFT VENTRICLE PLAX 2D LVIDd:         4.30 cm      Diastology LVIDs:         2.70 cm      LV e' medial:    6.85 cm/s LV PW:         1.20 cm      LV E/e' medial:  16.1 LV IVS:  1.10 cm      LV e' lateral:   6.64 cm/s LVOT diam:     1.90 cm      LV E/e' lateral: 16.6 LV SV:         59 LV SV Index:   30 LVOT Area:     2.84 cm  LV Volumes (MOD) LV vol d, MOD A2C: 128.0 ml LV vol d, MOD A4C: 106.0 ml LV vol s, MOD A2C: 61.3 ml LV vol s, MOD A4C: 45.8 ml LV SV MOD A2C:     66.7 ml LV SV MOD A4C:     106.0 ml LV SV MOD BP:      64.9 ml RIGHT VENTRICLE             IVC RV Basal diam:  2.90 cm     IVC diam: 2.00 cm RV Mid diam:    2.20 cm RV S prime:     13.40 cm/s TAPSE (M-mode): 1.8 cm LEFT ATRIUM             Index        RIGHT ATRIUM           Index LA diam:        2.80 cm 1.44 cm/m   RA Area:     15.80 cm LA Vol (A2C):   47.6 ml 24.42 ml/m  RA Volume:   41.60 ml  21.34 ml/m LA Vol (A4C):   24.2 ml 12.42 ml/m LA Biplane Vol: 35.2 ml 18.06 ml/m  AORTIC VALVE LVOT Vmax:   107.00 cm/s LVOT Vmean:  72.100 cm/s LVOT VTI:    0.207 m  AORTA Ao Root diam: 2.90 cm Ao Asc diam:  2.80 cm MITRAL VALVE                TRICUSPID VALVE MV Area (PHT): 3.72 cm     TR Peak grad:   23.2 mmHg MV Decel Time: 204 msec     TR Vmax:        241.00 cm/s MV E velocity: 110.00 cm/s MV A velocity: 107.00 cm/s  SHUNTS MV E/A ratio:  1.03         Systemic VTI:  0.21 m                             Systemic Diam: 1.90 cm Redell Shallow MD Electronically signed by Redell Shallow MD Signature Date/Time: 06/29/2024/9:10:21 AM    Final    DG Chest 2 View Result Date: 06/27/2024 EXAM: 2 VIEW(S) XRAY OF THE CHEST 06/27/2024 03:49:00 PM COMPARISON: 06/05/2024 CLINICAL HISTORY: chest pain FINDINGS: LUNGS AND PLEURA: Mild pulmonary edema. New bilateral small pleural effusions with associated atelectasis. No pneumothorax. HEART AND MEDIASTINUM: Mild cardiomegaly. BONES AND SOFT TISSUES: No  acute osseous abnormality. IMPRESSION: 1. Mild pulmonary edema with new bilateral small pleural effusions and associated atelectasis. 2. Mild cardiomegaly. Electronically signed by: Greig Pique MD 06/27/2024 06:05 PM EST RP Workstation: [id number]   Scheduled Meds:  apixaban   5 mg Oral BID   azaTHIOprine   100 mg Oral Daily   carvedilol   6.25 mg Oral BID WC   ferrous sulfate   325 mg Oral TID WC   fluticasone  furoate-vilanterol  1 puff Inhalation Daily   folic acid   1 mg Oral Daily   furosemide   20 mg Intravenous Q12H   isosorbide -hydrALAZINE   1 tablet Oral TID   levothyroxine   112 mcg Oral q AM  pantoprazole   40 mg Oral BID   senna  1 tablet Oral BID   Continuous Infusions:   LOS: 1 day    Time spent: 50 mins    Darcel Dawley, MD [facility]   If 7PM-7AM, please contact night-coverage  "

## 2024-06-29 NOTE — TOC Initial Note (Signed)
 Transition of Care [facility]) - Initial/Assessment Note    Patient Details  Name: Evelyn Baker MRN: [id number] Date of Birth: 01/02/1961  Transition of [facility] CM/SW Contact:    Marval Gell, RN Phone Number: 06/29/2024, 2:31 PM  Clinical Narrative:                  Patient admitted from home for workup and treatment of shortness of breath. Admitted with acute PE CHF exacerbation, anemia. Currently on RA. No needs identified at this time for DC, will continue to follow.    Expected Discharge Plan: Home/Self Care Barriers to Discharge: Continued Medical Work up   Patient Goals and CMS Choice            Expected Discharge Plan and Services   Discharge Planning Services: CM Consult   Living arrangements for the past 2 months: Single Family Home                                      Prior Living Arrangements/Services Living arrangements for the past 2 months: Single Family Home Lives with:: Spouse                   Activities of Daily Living   ADL Screening (condition at time of admission) Independently performs ADLs?: Yes (appropriate for developmental age) Is the patient deaf or have difficulty hearing?: No Does the patient have difficulty seeing, even when wearing glasses/contacts?: No Does the patient have difficulty concentrating, remembering, or making decisions?: No  Permission Sought/Granted                  Emotional Assessment              Admission diagnosis:  Pulmonary edema cardiac cause (HCC) [I50.1] CHF exacerbation (HCC) [I50.9] Congestive heart failure, unspecified HF chronicity, unspecified heart failure type (HCC) [I50.9] Acute renal failure superimposed on chronic kidney disease, unspecified acute renal failure type, unspecified CKD stage [N17.9, N18.9] Patient Active Problem List   Diagnosis Date Noted   Pulmonary edema cardiac cause (HCC) 06/28/2024   CHF exacerbation (HCC) 06/27/2024   Refusal of blood  transfusion for reasons of conscience 06/09/2024   AKI (acute kidney injury) 06/03/2024   Acute deep vein thrombosis (DVT) of left femoral vein (HCC) 06/03/2024   Normocytic anemia 06/03/2024   Hypokalemia 06/03/2024   History of CVA (cerebrovascular accident) 06/03/2024   Hypertension    Hypothyroidism    Hyperlipidemia    Asthma    Coronary artery disease involving native coronary artery of native heart without angina pectoris 04/12/2024   Mixed connective tissue disease 10/09/2023   Primary osteoarthritis of both feet 10/09/2023   Pain of left lower leg 07/07/2022   Dyspareunia in female 05/05/2022   Abnormal urine 05/05/2022   Chronic constipation 05/05/2022   Fibrocystic disease of breast 05/05/2022   Obesity 05/05/2022   Reactive depression (situational) 05/05/2022   Chronic venous insufficiency 02/13/2020   Leg edema, left 02/13/2020   PCP:  Katina Pfeiffer, PA-C Pharmacy:   [place] [facility] [address] [place] [address] Phone: [phone] Fax: [phone]  [facility] [address] [place] [address] Phone: [phone] Fax: [phone]     Social Drivers of Health (SDOH) Social History: SDOH Screenings   Food Insecurity: No Food Insecurity (06/28/2024)  Housing: Low Risk (06/28/2024)  Transportation  Needs: No Transportation Needs (06/28/2024)  Utilities: Not At Risk (06/28/2024)  Social Connections: Socially Integrated (06/28/2024)  Tobacco Use: Low Risk (06/28/2024)   SDOH Interventions:     Readmission Risk Interventions    06/06/2024    3:35 PM  Readmission Risk Prevention Plan  Transportation Screening Complete  PCP or Specialist Appt within 5-7 Days Complete  Home Care Screening Complete  Medication Review (RN CM) Complete

## 2024-06-29 NOTE — Telephone Encounter (Signed)
 FYI for you MR:  Hello Evelyn Baker,    On Monday I went to the ER [facility] and from there I was admitted to [facility] due to chest pains.   I have congestive heart failure having fluid around the heart and lungs. This can be shared with my regular pulmonary doctor Dr. Geronimo Dusky,   [name] hone [phone]

## 2024-06-29 NOTE — Progress Notes (Signed)
 Heart Failure Navigator Progress Note  Assessed for Heart & Vascular TOC clinic readiness.  Patient does not meet criteria due to she has a scheduled [facility] appointment on 07/15/2024. .   Navigator will sign off at this time.   Stephane Haddock, BSN, Scientist, Clinical (histocompatibility And Immunogenetics) Only

## 2024-06-29 NOTE — Consult Note (Addendum)
 "  Cardiology Consultation   Patient ID: Evelyn Baker MRN: [id number]; DOB: 1960-10-20  Admit date: 06/27/2024 Date of Consult: 06/29/2024  PCP:  Katina Pfeiffer, PA-C   [facility] Providers Cardiologist:  Madonna Large, DO     Patient Profile: MELISSSA Baker is a 63 y.o. female Jeovah's witness with a hx of CVA, HTN, HLD, hypothyroidism, mixed connective tissue disease, TIA, GERD, rheumatoid arthritis, anemia who is being seen 06/29/2024 for the evaluation of pericardial effusion at the request of Dr. Leotis.  History of Present Illness: [name] is a 63 year old female with past medical history noted above.  She has been followed by Dr. Large as an outpatient.  Found to have an elevated coronary calcium  score and underwent nuclear stress testing 03/2023 with no ischemia or infarction, low risk.  Echocardiogram at that time with LVEF of 55 to 60%, no regional wall motion abnormality, normal RV SF, normal PASP, trivial MR. She was last seen in the office 06/03/2024 with Glendia Ferrier for blood pressure management.  Noted she had been on spironolactone  previously for hypertension and hypokalemia but could not tolerate this and was returned to chlorthalidone .  At previous visit in October her valsartan  was increased to 320 mg daily.  Prior to this office visit stated she had been started on doxycycline  for cellulitis.  Recommendations at this visit were to start BiDil  20/37 mg 3 times daily, continue valsartan  320 mg daily, chlorthalidone  25 mg daily and monitor BP with follow-up with the Pharm.D.  Presented to the ED that same day with complaints of left lower extremity pain and swelling and was found to have a left common femoral DVT.  She was started on Eliquis .  Also found to have iron  deficiency anemia during that admission.  Seen by oncology who agreed with iron  supplementation.  Planned to see her back as an outpatient.  Follow-up note from 12/18 indicated Dr. Large increased her  BiDil  to 2 tablets 3 times a day as her systolic blood pressures continue to run high.   Presented to the ED on 12/29 with complaints of persistent cough and increasing shortness of breath. Some orthopnea, unilateral swelling in the LLE which is her normal. No chest pain but noted some tightness.   In the ED her labs showed sodium 132, potassium is 3.7, creatinine 1.9, albumin  2.8, proBNP 2589, high-sensitivity troponin 43>>39, lactic acid 0.8, WBC 7.7, hemoglobin 7.4. BC negative.  EKG shows sinus rhythm, 100 bpm.  Chest x-ray with mild pulmonary edema, new bilateral small pleural effusions.  Admitted to internal medicine and started on IV Lasix .  Echocardiogram 12/31 with large pericardial effusion, no RV diastolic collapse to suggest tamponade, LVEF of 60 to 65%, mild LVH, grade 1 diastolic function, normal RV, trivial MR. Cardiology asked to evaluate.   Past Medical History:  Diagnosis Date   Asthma    Depression    Dyspnea    Hyperlipidemia    Hypertension    Hypothyroidism    Sciatic nerve disease    Thyroid  disease     Past Surgical History:  Procedure Laterality Date   FOOT SURGERY Left    STRABISMUS SURGERY     age 26   TOTAL ABDOMINAL HYSTERECTOMY     VEIN LIGATION AND STRIPPING Left 01/22/2021   Procedure: LEFT GREATER SAPHENOUS VEIN  LIGATION AND STRIPPING;  Surgeon: Harvey Carlin BRAVO, MD;  Location: [facility] OR;  Service: Vascular;  Laterality: Left;    Scheduled Meds:  apixaban   5 mg  Oral BID   azaTHIOprine   100 mg Oral Daily   carvedilol   6.25 mg Oral BID WC   ferrous sulfate   325 mg Oral TID WC   fluticasone  furoate-vilanterol  1 puff Inhalation Daily   folic acid   1 mg Oral Daily   furosemide   20 mg Intravenous Q12H   isosorbide -hydrALAZINE   1 tablet Oral TID   levothyroxine   112 mcg Oral q AM   pantoprazole   40 mg Oral BID   senna  1 tablet Oral BID   Continuous Infusions:  PRN Meds: acetaminophen  **OR** [DISCONTINUED] acetaminophen , albuterol , hydrALAZINE ,  ondansetron  **OR** ondansetron  (ZOFRAN ) IV, senna-docusate  Allergies:   Allergies[1]  Social History:   Social History   Socioeconomic History   Marital status: Married    Spouse name: Not on file   Number of children: 4   Years of education: Not on file   Highest education level: Not on file  Occupational History   Not on file  Tobacco Use   Smoking status: Never    Passive exposure: Never   Smokeless tobacco: Never  Vaping Use   Vaping status: Never Used  Substance and Sexual Activity   Alcohol use: No   Drug use: No   Sexual activity: Yes    Birth control/protection: Surgical    Comment: partial hysterectomy  Other Topics Concern   Not on file  Social History Narrative   Not on file   Social Drivers of Health   Tobacco Use: Low Risk (06/28/2024)   Patient History    Smoking Tobacco Use: Never    Smokeless Tobacco Use: Never    Passive Exposure: Never  Financial Resource Strain: Not on file  Food Insecurity: No Food Insecurity (06/28/2024)   Epic    Worried About Programme Researcher, Broadcasting/film/video in the Last Year: Never true    Ran Out of Food in the Last Year: Never true  Transportation Needs: No Transportation Needs (06/28/2024)   Epic    Lack of Transportation (Medical): No    Lack of Transportation (Non-Medical): No  Physical Activity: Not on file  Stress: Not on file  Social Connections: Socially Integrated (06/28/2024)   Social Connection and Isolation Panel    Frequency of Communication with Friends and Family: More than three times a week    Frequency of Social Gatherings with Friends and Family: More than three times a week    Attends Religious Services: More than 4 times per year    Active Member of [employer] or Organizations: Yes    Attends Banker Meetings: More than 4 times per year    Marital Status: Married  Catering Manager Violence: Not At Risk (06/28/2024)   Epic    Fear of Current or Ex-Partner: No    Emotionally Abused: No    Physically  Abused: No    Sexually Abused: No  Depression (PHQ2-9): Not on file  Alcohol Screen: Not on file  Housing: Low Risk (06/28/2024)   Epic    Unable to Pay for Housing in the Last Year: No    Number of Times Moved in the Last Year: 0    Homeless in the Last Year: No  Utilities: Not At Risk (06/28/2024)   Epic    Threatened with loss of utilities: No  Health Literacy: Not on file    Family History:    Family History  Problem Relation Age of Onset   Heart failure Mother    Kidney failure Mother    Stroke  Sister    Aneurysm Sister    Heart Problems Brother    Hypertension Son    Hypertension Daughter      ROS:  Please see the history of present illness.   All other ROS reviewed and negative.     Physical Exam/Data: Vitals:   06/29/24 0415 06/29/24 0625 06/29/24 0835 06/29/24 1130  BP:  136/80 130/75 125/77  Pulse:  90 90 87  Resp: 19 18 20 20   Temp: 97.9 F (36.6 C) 98 F (36.7 C) (!) 97.4 F (36.3 C) (!) 97.4 F (36.3 C)  TempSrc: Oral Temporal Oral Oral  SpO2:  97% 99% 99%  Weight:  87.6 kg    Height:        Intake/Output Summary (Last 24 hours) at 06/29/2024 1309 Last data filed at 06/29/2024 0600 Gross per 24 hour  Intake 957 ml  Output 1500 ml  Net -543 ml      06/29/2024    6:25 AM 06/28/2024    3:33 PM 06/20/2024   12:16 PM  Last 3 Weights  Weight (lbs) 193 lb 2 oz 195 lb 1.7 oz 203 lb 11.2 oz  Weight (kg) 87.6 kg 88.5 kg 92.398 kg     Body mass index is 32.14 kg/m.  General:  Well nourished, well developed, in no acute distress HEENT: normal Neck: no JVD Vascular: No carotid bruits; Distal pulses 2+ bilaterally Cardiac:  normal S1, S2; RRR; no murmur  Lungs:  clear to auscultation bilaterally, no wheezing, rhonchi or rales  Abd: soft, nontender, no hepatomegaly  Ext: no edema Musculoskeletal:  No deformities, BUE and BLE strength normal and equal Skin: warm and dry, wearing support stocking on left leg  Neuro:  no focal abnormalities  noted Psych:  Normal affect   EKG:  The EKG was personally reviewed and demonstrates:  Sinus Rhythm 100 bpm, septal infarction age undetermined  Telemetry:  Telemetry was personally reviewed and demonstrates:    Relevant CV Studies:  Echo: 06/29/2024  IMPRESSIONS     1. Large pericardial effusion; IVC not dilated and no RV diastolic  collapse to suggest tamponade.   2. Left ventricular ejection fraction, by estimation, is 60 to 65%. The  left ventricle has normal function. The left ventricle has no regional  wall motion abnormalities. There is mild left ventricular hypertrophy.  Left ventricular diastolic parameters  are consistent with Grade I diastolic dysfunction (impaired relaxation).   3. Right ventricular systolic function is normal. The right ventricular  size is normal.   4. Large pericardial effusion. There is no evidence of cardiac tamponade.   5. The mitral valve is normal in structure. Trivial mitral valve  regurgitation. No evidence of mitral stenosis.   6. The aortic valve is tricuspid. Aortic valve regurgitation is not  visualized. No aortic stenosis is present.   7. The inferior vena cava is normal in size with greater than 50%  respiratory variability, suggesting right atrial pressure of 3 mmHg.   FINDINGS   Left Ventricle: Left ventricular ejection fraction, by estimation, is 60  to 65%. The left ventricle has normal function. The left ventricle has no  regional wall motion abnormalities. The left ventricular internal cavity  size was normal in size. There is   mild left ventricular hypertrophy. Left ventricular diastolic parameters  are consistent with Grade I diastolic dysfunction (impaired relaxation).   Right Ventricle: The right ventricular size is normal. Right ventricular  systolic function is normal.   Left Atrium: Left atrial size  was normal in size.   Right Atrium: Right atrial size was normal in size.   Pericardium: A large pericardial  effusion is present. There is no evidence  of cardiac tamponade.   Mitral Valve: The mitral valve is normal in structure. Trivial mitral  valve regurgitation. No evidence of mitral valve stenosis.   Tricuspid Valve: The tricuspid valve is normal in structure. Tricuspid  valve regurgitation is trivial. No evidence of tricuspid stenosis.   Aortic Valve: The aortic valve is tricuspid. Aortic valve regurgitation is  not visualized. No aortic stenosis is present.   Pulmonic Valve: The pulmonic valve was normal in structure. Pulmonic valve  regurgitation is trivial. No evidence of pulmonic stenosis.   Aorta: The aortic root is normal in size and structure.   Venous: The inferior vena cava is normal in size with greater than 50%  respiratory variability, suggesting right atrial pressure of 3 mmHg.   IAS/Shunts: No atrial level shunt detected by color flow Doppler.   Additional Comments: Large pericardial effusion; IVC not dilated and no RV  diastolic collapse to suggest tamponade.   Laboratory Data: High Sensitivity Troponin:  No results for input(s): TROPONINIHS in the last 720 hours.  Recent Labs  Lab 06/27/24 1502 06/27/24 1714  TRNPT 43* 39*      Chemistry Recent Labs  Lab 06/27/24 1502 06/29/24 0339  NA 132* 134*  K 3.7 3.4*  CL 92* 95*  CO2 28 28  GLUCOSE 96 120*  BUN 35* 44*  CREATININE 1.90* 1.60*  CALCIUM  8.7* 8.8*  GFRNONAA 30* 36*  ANIONGAP 12 12    Recent Labs  Lab 06/27/24 1502 06/29/24 0339  PROT 7.1 6.6  ALBUMIN  2.8* 2.7*  AST 63* 74*  ALT 40 51*  ALKPHOS 84 73  BILITOT 0.7 0.3   Lipids No results for input(s): CHOL, TRIG, HDL, LABVLDL, LDLCALC, CHOLHDL in the last 168 hours.  Hematology Recent Labs  Lab 06/27/24 1501 06/29/24 0339  WBC 7.7 10.8*  RBC 2.84* 2.90*  HGB 7.4* 7.5*  HCT 21.0* 21.1*  MCV 73.9* 72.8*  MCH 26.1 25.9*  MCHC 35.2 35.5  RDW 19.7* 19.4*  PLT 262 339   Thyroid  No results for input(s): TSH,  FREET4 in the last 168 hours.  BNP Recent Labs  Lab 06/27/24 1506 06/29/24 0339  PROBNP 2,589.0* 1,724.0*    DDimer No results for input(s): DDIMER in the last 168 hours.  Radiology/Studies:  ECHOCARDIOGRAM COMPLETE Result Date: 06/29/2024    ECHOCARDIOGRAM REPORT   Patient Name:   Evelyn Baker Date of Exam: 06/29/2024 Medical Rec #:  [id number]       Height:       65.0 in Accession #:    [id number]      Weight:       193.1 lb Date of Birth:  [birth year]       BSA:          1.949 m Patient Age:    63 years        BP:           136/80 mmHg Patient Gender: F               HR:           80 bpm. Exam Location:  Inpatient Procedure: 2D Echo, Cardiac Doppler and Color Doppler (Both Spectral and Color            Flow Doppler were utilized during procedure). Indications:    CHF -  Acute Diastolic  History:        Patient has prior history of Echocardiogram examinations, most                 recent 03/17/2023. CHF, CAD, Signs/Symptoms:Edema, Shortness of                 Breath and Dyspnea; Risk Factors:Hypertension and Dyslipidemia.  Sonographer:    Juliene Rucks Referring Phys: [id number] JEFFREY T [facility]  Sonographer Comments: Patient is obese. IMPRESSIONS  1. Large pericardial effusion; IVC not dilated and no RV diastolic collapse to suggest tamponade.  2. Left ventricular ejection fraction, by estimation, is 60 to 65%. The left ventricle has normal function. The left ventricle has no regional wall motion abnormalities. There is mild left ventricular hypertrophy. Left ventricular diastolic parameters are consistent with Grade I diastolic dysfunction (impaired relaxation).  3. Right ventricular systolic function is normal. The right ventricular size is normal.  4. Large pericardial effusion. There is no evidence of cardiac tamponade.  5. The mitral valve is normal in structure. Trivial mitral valve regurgitation. No evidence of mitral stenosis.  6. The aortic valve is tricuspid. Aortic valve regurgitation is not  visualized. No aortic stenosis is present.  7. The inferior vena cava is normal in size with greater than 50% respiratory variability, suggesting right atrial pressure of 3 mmHg. FINDINGS  Left Ventricle: Left ventricular ejection fraction, by estimation, is 60 to 65%. The left ventricle has normal function. The left ventricle has no regional wall motion abnormalities. The left ventricular internal cavity size was normal in size. There is  mild left ventricular hypertrophy. Left ventricular diastolic parameters are consistent with Grade I diastolic dysfunction (impaired relaxation). Right Ventricle: The right ventricular size is normal. Right ventricular systolic function is normal. Left Atrium: Left atrial size was normal in size. Right Atrium: Right atrial size was normal in size. Pericardium: A large pericardial effusion is present. There is no evidence of cardiac tamponade. Mitral Valve: The mitral valve is normal in structure. Trivial mitral valve regurgitation. No evidence of mitral valve stenosis. Tricuspid Valve: The tricuspid valve is normal in structure. Tricuspid valve regurgitation is trivial. No evidence of tricuspid stenosis. Aortic Valve: The aortic valve is tricuspid. Aortic valve regurgitation is not visualized. No aortic stenosis is present. Pulmonic Valve: The pulmonic valve was normal in structure. Pulmonic valve regurgitation is trivial. No evidence of pulmonic stenosis. Aorta: The aortic root is normal in size and structure. Venous: The inferior vena cava is normal in size with greater than 50% respiratory variability, suggesting right atrial pressure of 3 mmHg. IAS/Shunts: No atrial level shunt detected by color flow Doppler. Additional Comments: Large pericardial effusion; IVC not dilated and no RV diastolic collapse to suggest tamponade.  LEFT VENTRICLE PLAX 2D LVIDd:         4.30 cm      Diastology LVIDs:         2.70 cm      LV e' medial:    6.85 cm/s LV PW:         1.20 cm      LV E/e'  medial:  16.1 LV IVS:        1.10 cm      LV e' lateral:   6.64 cm/s LVOT diam:     1.90 cm      LV E/e' lateral: 16.6 LV SV:         59 LV SV Index:   30 LVOT Area:  2.84 cm  LV Volumes (MOD) LV vol d, MOD A2C: 128.0 ml LV vol d, MOD A4C: 106.0 ml LV vol s, MOD A2C: 61.3 ml LV vol s, MOD A4C: 45.8 ml LV SV MOD A2C:     66.7 ml LV SV MOD A4C:     106.0 ml LV SV MOD BP:      64.9 ml RIGHT VENTRICLE             IVC RV Basal diam:  2.90 cm     IVC diam: 2.00 cm RV Mid diam:    2.20 cm RV S prime:     13.40 cm/s TAPSE (M-mode): 1.8 cm LEFT ATRIUM             Index        RIGHT ATRIUM           Index LA diam:        2.80 cm 1.44 cm/m   RA Area:     15.80 cm LA Vol (A2C):   47.6 ml 24.42 ml/m  RA Volume:   41.60 ml  21.34 ml/m LA Vol (A4C):   24.2 ml 12.42 ml/m LA Biplane Vol: 35.2 ml 18.06 ml/m  AORTIC VALVE LVOT Vmax:   107.00 cm/s LVOT Vmean:  72.100 cm/s LVOT VTI:    0.207 m  AORTA Ao Root diam: 2.90 cm Ao Asc diam:  2.80 cm MITRAL VALVE                TRICUSPID VALVE MV Area (PHT): 3.72 cm     TR Peak grad:   23.2 mmHg MV Decel Time: 204 msec     TR Vmax:        241.00 cm/s MV E velocity: 110.00 cm/s MV A velocity: 107.00 cm/s  SHUNTS MV E/A ratio:  1.03         Systemic VTI:  0.21 m                             Systemic Diam: 1.90 cm Redell Shallow MD Electronically signed by Redell Shallow MD Signature Date/Time: 06/29/2024/9:10:21 AM    Final    DG Chest 2 View Result Date: 06/27/2024 EXAM: 2 VIEW(S) XRAY OF THE CHEST 06/27/2024 03:49:00 PM COMPARISON: 06/05/2024 CLINICAL HISTORY: chest pain FINDINGS: LUNGS AND PLEURA: Mild pulmonary edema. New bilateral small pleural effusions with associated atelectasis. No pneumothorax. HEART AND MEDIASTINUM: Mild cardiomegaly. BONES AND SOFT TISSUES: No acute osseous abnormality. IMPRESSION: 1. Mild pulmonary edema with new bilateral small pleural effusions and associated atelectasis. 2. Mild cardiomegaly. Electronically signed by: Greig Pique MD 06/27/2024  06:05 PM EST RP Workstation: [id number]     Assessment and Plan:  Evelyn Baker is a 63 y.o. female Jeovah's witness with a hx of CVA, HTN, HLD, hypothyroidism, mixed connective tissue disease, TIA, GERD, rheumatoid arthritis, anemia who is being seen 06/29/2024 for the evaluation of pericardial effusion at the request of Dr. Leotis.  Pericardial effusion -- presented with persistent cough and shortness of breath -- echo 12/31 with large pericardial effusion, no RV diastolic collapse to suggest tamponade, LVEF of 60 to 65%, mild LVH, grade 1 diastolic function, normal RV, trivial MR. She has no signs of hemodynamic compromise with stable HR and BP. Also currently on Eliquis  for DVT and jeovahs witness.  -- will check CRP, Sed rate as she has had persistent cough, inflammatory/pericarditis? Add colchicine if appropriate   Pleural effusions --  noted small on CXR on admission, s/p IV lasix . Net - 1.5L. Cr improved -- continue IV lasix  through today  Left femoral vein DVT -- Dx on admission 12/5, has been on Eliquis    AKI on CKD stage IIIa -- baseline Cr around 1-1.3, elevated on 1.9 on admission. Down to 1.6 today -- follow BMET   HTN -- controlled -- continue coreg  6.25mg  BID, BiDil  1 tab TID   For questions or updates, please contact [facility] Please consult [email or web address] for contact info under   Signed, Manuelita Rummer, NP  06/29/2024 1:09 PM   ATTENDING ATTESTATION:  After conducting a review of all available clinical information with the care team, interviewing the patient, and performing a physical exam, I agree with the findings and plan described in this note with adjustments as indicated below which were discussed and enacted by staff above.   GEN: No acute distress, AO x 3 HEENT:  MMM, no JVD, no scleral icterus Cardiac: RRR, no murmurs, rubs, or gallops.  Respiratory: Clear to auscultation bilaterally. GI: Soft, nontender, non-distended  MS: No  edema; No deformity. 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  Her chest x-ray demonstrated mild pulmonary edema.  An echocardiogram demonstrated effusion with no evidence of tamponade.  We are asked to give an opinion.  Agree with checking CRP and ESR.  If elevated would treat with ibuprofen  and colchicine.  She is normotensive and not tachycardic.  No pulsus paradoxsus by examination.  Kaysan Peixoto, MD Pager [phone]     [1]  Allergies Allergen Reactions   Shellfish Allergy Anaphylaxis and Swelling   Avocado Swelling    Lips   Codeine  Other (See Comments)    Constipation   Iodinated Contrast Media Other (See Comments)   Mushroom Other (See Comments)    Itchy throat   Pineapple Swelling    Lip swelling    Penicillins Rash    Reaction: Childhood   "

## 2024-06-29 NOTE — Progress Notes (Signed)
 Echocardiogram 2D Echocardiogram has been performed.  Juliene JINNY Rucks 06/29/2024, 8:49 AM

## 2024-06-30 DIAGNOSIS — I509 Heart failure, unspecified: Secondary | ICD-10-CM | POA: Diagnosis not present

## 2024-06-30 DIAGNOSIS — I3139 Other pericardial effusion (noninflammatory): Secondary | ICD-10-CM | POA: Diagnosis not present

## 2024-06-30 LAB — CBC
HCT: 21.6 % — ABNORMAL LOW (ref 36.0–46.0)
Hemoglobin: 7.7 g/dL — ABNORMAL LOW (ref 12.0–15.0)
MCH: 26 pg (ref 26.0–34.0)
MCHC: 35.6 g/dL (ref 30.0–36.0)
MCV: 73 fL — ABNORMAL LOW (ref 80.0–100.0)
Platelets: 272 K/uL (ref 150–400)
RBC: 2.96 MIL/uL — ABNORMAL LOW (ref 3.87–5.11)
RDW: 19.7 % — ABNORMAL HIGH (ref 11.5–15.5)
WBC: 7.8 K/uL (ref 4.0–10.5)
nRBC: 0 % (ref 0.0–0.2)

## 2024-06-30 LAB — BASIC METABOLIC PANEL WITH GFR
Anion gap: 9 (ref 5–15)
BUN: 36 mg/dL — ABNORMAL HIGH (ref 8–23)
CO2: 30 mmol/L (ref 22–32)
Calcium: 8.9 mg/dL (ref 8.9–10.3)
Chloride: 96 mmol/L — ABNORMAL LOW (ref 98–111)
Creatinine, Ser: 1.42 mg/dL — ABNORMAL HIGH (ref 0.44–1.00)
GFR, Estimated: 42 mL/min — ABNORMAL LOW
Glucose, Bld: 94 mg/dL (ref 70–99)
Potassium: 3.4 mmol/L — ABNORMAL LOW (ref 3.5–5.1)
Sodium: 135 mmol/L (ref 135–145)

## 2024-06-30 LAB — MAGNESIUM: Magnesium: 1.9 mg/dL (ref 1.7–2.4)

## 2024-06-30 LAB — PHOSPHORUS: Phosphorus: 2.5 mg/dL (ref 2.5–4.6)

## 2024-06-30 MED ORDER — COLCHICINE 0.6 MG PO TABS
0.6000 mg | ORAL_TABLET | Freq: Two times a day (BID) | ORAL | Status: DC
  Administered 2024-06-30 – 2024-07-06 (×13): 0.6 mg via ORAL
  Filled 2024-06-30 (×13): qty 1

## 2024-06-30 MED ORDER — POTASSIUM CHLORIDE 20 MEQ PO PACK: 40.0000 meq | PACK | Freq: Once | ORAL | Status: DC

## 2024-06-30 MED ORDER — POTASSIUM CHLORIDE 20 MEQ PO PACK: 40.0000 meq | PACK | ORAL | Status: DC

## 2024-06-30 MED ORDER — POTASSIUM CHLORIDE ER 20 MEQ PO TBCR: 40.0000 meq | EXTENDED_RELEASE_TABLET | ORAL | Status: DC

## 2024-06-30 MED ORDER — POTASSIUM CHLORIDE CRYS ER 20 MEQ PO TBCR
40.0000 meq | EXTENDED_RELEASE_TABLET | ORAL | Status: AC
  Administered 2024-06-30 (×2): 40 meq via ORAL
  Filled 2024-06-30 (×2): qty 2

## 2024-06-30 MED ORDER — GUAIFENESIN-DM 100-10 MG/5ML PO SYRP
5.0000 mL | ORAL_SOLUTION | ORAL | Status: DC | PRN
  Administered 2024-06-30 – 2024-07-06 (×3): 5 mL via ORAL
  Filled 2024-06-30 (×4): qty 5

## 2024-06-30 NOTE — Progress Notes (Signed)
 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 Hematology saw her during that admission, and she was dosed with IV Fe and epo.  She returned to [facility] 06/27/24 reporting a persistent cough since her discharge with gradually increasing exertional shortness of breath and severe orthopnea.  Patient was admitted for acute pulmonary edema started on IV Lasix .  Assessment & Plan:   Principal Problem:   CHF exacerbation (HCC) Active Problems:   Pulmonary edema cardiac cause (HCC)   Pericardial effusion   Acute / Subacute pulmonary edema of unclear etiology: Pericardial Effusion : Suspected newly diagnosed CHF. Continue to diurese with Lasix  20 mg IV q 12hr. Monitor daily weight, intake output charting. Monitor hemoglobin trend, follow BMP trend. Echocardiogram shows LVEF 60 to 65%, large pericardial effusion but no tamponade signs. Patient is hemodynamically stable,  no signs of tamponade. Cardiology is consulted.  Patient did have bronchitis earlier this month,  also has mixed connective tissue disease.  CRP and ESR elevated.  Cardiology recommended colchicine 0.6 mg twice daily,  would like to avoid NSAIDs due to Eliquis . Will repeat limited follow-up echocardiogram tomorrow to make sure effusion is not enlarging.    Left femoral vein DVT: No evidence of PE during recent admission. Continue anticoagulation w/ Eliquis  . Patient has noted significant decrease in the swelling of her LLE while on anticoaguation.   AKI on CKD IIIa: Creatinine has improved while being on diuresis.  Continue to monitor serum creatinine, avoid  nephrotoxic medications.   Hyponatremia: Possibly due to volume overload - Follow trend w/ diuresis  Improved.  HTN: BP controlled at present - follow w/ diuresis.   Hyperlipidemia: Continue home medications.   Hypothyroidism: Continue levothyroxine .   MCTD: On chronic azathioprine .  Hypochromic normocytic anemia: Continue iron  supplementation.  DVT prophylaxis: Eliquis  Code Status:Full code Family Communication: Husband at bed side Disposition Plan:     Status is: Inpatient Remains inpatient appropriate because: Patient admitted for CHF exacerbation,  also have anemia.  Found to have large pericardial effusion,  cardiology is consulted. Plan is to repeat echocardiogram tomorrow to see if it is improving after starting on colchicine.  Patient is not medically ready for discharge.     Consultants:   Cardiology  Procedures: Echocardiogram.  Antimicrobials: Anti-infectives (From admission, onward)    None      Subjective: Patient was seen and examined at bedside.  Overnight events noted. Patient was sitting comfortably,  denies any chest pain,  shortness of breath.   She is hemodynamically stable.  Objective: Vitals:   06/30/24 0500 06/30/24 0822 06/30/24 0825 06/30/24 1126  BP: 132/74  132/83 124/64  Pulse: (!) 101  99 91  Resp: 18  18 16   Temp: 98.6 F (37 C)  99.6 F (37.6 C) 99.7 F (37.6 C)  TempSrc: Temporal  Oral Oral  SpO2: 98%  95% 95%  Weight: 87.2 kg 87.6 kg    Height:        Intake/Output Summary (Last 24 hours) at 06/30/2024 1507 Last data filed at 06/30/2024 1444 Gross per 24 hour  Intake 720 ml  Output 2000 ml  Net -1280 ml   [employer]  06/29/24 0625 06/30/24 0500 06/30/24 0822  Weight: 87.6 kg 87.2 kg 87.6 kg    Examination:  General exam: Appears calm and comfortable, not in any acute distress. Respiratory system: CTA Bilaterally. Respiratory effort normal. RR 14 Cardiovascular system: S1 & S2 heard, RRR. No JVD,  murmurs, rubs, gallops or clicks. No pedal edema. Gastrointestinal system: Abdomen is non distended, soft and non tender.  Normal bowel sounds heard. Central nervous system: Alert and oriented x 3. No focal neurological deficits. Extremities: Edema+, no cyanosis, no clubbing. Skin: No rashes, lesions or ulcers Psychiatry: Judgement and insight appear normal. Mood & affect appropriate.    Data Reviewed: I have personally reviewed following labs and imaging studies  CBC: Recent Labs  Lab 06/27/24 1501 06/29/24 0339 06/30/24 0422  WBC 7.7 10.8* 7.8  HGB 7.4* 7.5* 7.7*  HCT 21.0* 21.1* 21.6*  MCV 73.9* 72.8* 73.0*  PLT 262 339 272   Basic Metabolic Panel: Recent Labs  Lab 06/27/24 1502 06/29/24 0339 06/30/24 0422  NA 132* 134* 135  K 3.7 3.4* 3.4*  CL 92* 95* 96*  CO2 28 28 30   GLUCOSE 96 120* 94  BUN 35* 44* 36*  CREATININE 1.90* 1.60* 1.42*  CALCIUM  8.7* 8.8* 8.9  MG  --   --  1.9  PHOS  --   --  2.5   GFR: Estimated Creatinine Clearance: 45.4 mL/min (A) (by C-G formula based on SCr of 1.42 mg/dL (H)). Liver Function Tests: Recent Labs  Lab 06/27/24 1502 06/29/24 0339  AST 63* 74*  ALT 40 51*  ALKPHOS 84 73  BILITOT 0.7 0.3  PROT 7.1 6.6  ALBUMIN  2.8* 2.7*   No results for input(s): LIPASE, AMYLASE in the last 168 hours. No results for input(s): AMMONIA in the last 168 hours. Coagulation Profile: No results for input(s): INR, PROTIME in the last 168 hours. Cardiac Enzymes: No results for input(s): CKTOTAL, CKMB, CKMBINDEX, TROPONINI in the last 168 hours. BNP (last 3 results) Recent Labs    06/27/24 1506 06/29/24 0339  PROBNP 2,589.0* 1,724.0*   HbA1C: No results for input(s): HGBA1C in the last 72 hours. CBG: No results for input(s): GLUCAP in the last 168 hours. Lipid Profile: No results for input(s): CHOL, HDL, LDLCALC, TRIG, CHOLHDL, LDLDIRECT in the last 72 hours. Thyroid  Function Tests: No results for  input(s): TSH, T4TOTAL, FREET4, T3FREE, THYROIDAB in the last 72 hours. Anemia Panel: No results for input(s): VITAMINB12, FOLATE, FERRITIN, TIBC, IRON , RETICCTPCT in the last 72 hours. Sepsis Labs: Recent Labs  Lab 06/27/24 1506  LATICACIDVEN 0.8    Recent Results (from the past 240 hours)  Blood culture (routine x 2)     Status: None (Preliminary result)   Collection Time: 06/27/24  3:06 PM   Specimen: BLOOD  Result Value Ref Range Status   Specimen Description   Final    BLOOD LEFT ANTECUBITAL Performed at [facility], [address], [place], [place] [address]    Special Requests   Final    Blood Culture results may not be optimal due to an inadequate volume of blood received in culture bottles BOTTLES DRAWN AEROBIC AND ANAEROBIC Performed at [facility], [address] [place], [place] [address]    Culture   Final    NO GROWTH 3 DAYS Performed at [facility], [address] [place], [place] [address]    Report Status PENDING  Incomplete    Radiology Studies: ECHOCARDIOGRAM COMPLETE Result Date: 06/29/2024  ECHOCARDIOGRAM REPORT   Patient Name:   Evelyn Baker Date of Exam: 06/29/2024 Medical Rec #:  [id number]       Height:       65.0 in Accession #:    [id number]      Weight:       193.1 lb Date of Birth:  1961-06-28       BSA:          1.949 m Patient Age:    63 years        BP:           136/80 mmHg Patient Gender: F               HR:           80 bpm. Exam Location:  Inpatient Procedure: 2D Echo, Cardiac Doppler and Color Doppler (Both Spectral and Color            Flow Doppler were utilized during procedure). Indications:    CHF - Acute Diastolic  History:        Patient has prior history of Echocardiogram examinations, most                 recent 03/17/2023. CHF, CAD, Signs/Symptoms:Edema, Shortness of                 Breath and Dyspnea; Risk Factors:Hypertension and Dyslipidemia.  Sonographer:     Juliene Rucks Referring Phys: [id number] JEFFREY T [facility]  Sonographer Comments: Patient is obese. IMPRESSIONS  1. Large pericardial effusion; IVC not dilated and no RV diastolic collapse to suggest tamponade.  2. Left ventricular ejection fraction, by estimation, is 60 to 65%. The left ventricle has normal function. The left ventricle has no regional wall motion abnormalities. There is mild left ventricular hypertrophy. Left ventricular diastolic parameters are consistent with Grade I diastolic dysfunction (impaired relaxation).  3. Right ventricular systolic function is normal. The right ventricular size is normal.  4. Large pericardial effusion. There is no evidence of cardiac tamponade.  5. The mitral valve is normal in structure. Trivial mitral valve regurgitation. No evidence of mitral stenosis.  6. The aortic valve is tricuspid. Aortic valve regurgitation is not visualized. No aortic stenosis is present.  7. The inferior vena cava is normal in size with greater than 50% respiratory variability, suggesting right atrial pressure of 3 mmHg. FINDINGS  Left Ventricle: Left ventricular ejection fraction, by estimation, is 60 to 65%. The left ventricle has normal function. The left ventricle has no regional wall motion abnormalities. The left ventricular internal cavity size was normal in size. There is  mild left ventricular hypertrophy. Left ventricular diastolic parameters are consistent with Grade I diastolic dysfunction (impaired relaxation). Right Ventricle: The right ventricular size is normal. Right ventricular systolic function is normal. Left Atrium: Left atrial size was normal in size. Right Atrium: Right atrial size was normal in size. Pericardium: A large pericardial effusion is present. There is no evidence of cardiac tamponade. Mitral Valve: The mitral valve is normal in structure. Trivial mitral valve regurgitation. No evidence of mitral valve stenosis. Tricuspid Valve: The tricuspid valve is normal in  structure. Tricuspid valve regurgitation is trivial. No evidence of tricuspid stenosis. Aortic Valve: The aortic valve is tricuspid. Aortic valve regurgitation is not visualized. No aortic stenosis is present. Pulmonic Valve: The pulmonic valve was normal in structure. Pulmonic valve regurgitation is trivial. No evidence of pulmonic stenosis. Aorta: The aortic root is normal in size and structure. Venous: The  inferior vena cava is normal in size with greater than 50% respiratory variability, suggesting right atrial pressure of 3 mmHg. IAS/Shunts: No atrial level shunt detected by color flow Doppler. Additional Comments: Large pericardial effusion; IVC not dilated and no RV diastolic collapse to suggest tamponade.  LEFT VENTRICLE PLAX 2D LVIDd:         4.30 cm      Diastology LVIDs:         2.70 cm      LV e' medial:    6.85 cm/s LV PW:         1.20 cm      LV E/e' medial:  16.1 LV IVS:        1.10 cm      LV e' lateral:   6.64 cm/s LVOT diam:     1.90 cm      LV E/e' lateral: 16.6 LV SV:         59 LV SV Index:   30 LVOT Area:     2.84 cm  LV Volumes (MOD) LV vol d, MOD A2C: 128.0 ml LV vol d, MOD A4C: 106.0 ml LV vol s, MOD A2C: 61.3 ml LV vol s, MOD A4C: 45.8 ml LV SV MOD A2C:     66.7 ml LV SV MOD A4C:     106.0 ml LV SV MOD BP:      64.9 ml RIGHT VENTRICLE             IVC RV Basal diam:  2.90 cm     IVC diam: 2.00 cm RV Mid diam:    2.20 cm RV S prime:     13.40 cm/s TAPSE (M-mode): 1.8 cm LEFT ATRIUM             Index        RIGHT ATRIUM           Index LA diam:        2.80 cm 1.44 cm/m   RA Area:     15.80 cm LA Vol (A2C):   47.6 ml 24.42 ml/m  RA Volume:   41.60 ml  21.34 ml/m LA Vol (A4C):   24.2 ml 12.42 ml/m LA Biplane Vol: 35.2 ml 18.06 ml/m  AORTIC VALVE LVOT Vmax:   107.00 cm/s LVOT Vmean:  72.100 cm/s LVOT VTI:    0.207 m  AORTA Ao Root diam: 2.90 cm Ao Asc diam:  2.80 cm MITRAL VALVE                TRICUSPID VALVE MV Area (PHT): 3.72 cm     TR Peak grad:   23.2 mmHg MV Decel Time: 204 msec      TR Vmax:        241.00 cm/s MV E velocity: 110.00 cm/s MV A velocity: 107.00 cm/s  SHUNTS MV E/A ratio:  1.03         Systemic VTI:  0.21 m                             Systemic Diam: 1.90 cm Redell Shallow MD Electronically signed by Redell Shallow MD Signature Date/Time: 06/29/2024/9:10:21 AM    Final    Scheduled Meds:  apixaban   5 mg Oral BID   azaTHIOprine   100 mg Oral Daily   carvedilol   6.25 mg Oral BID WC   colchicine  0.6 mg Oral BID   ferrous sulfate   325 mg Oral TID  WC   fluticasone  furoate-vilanterol  1 puff Inhalation Daily   folic acid   1 mg Oral Daily   furosemide   20 mg Intravenous Q12H   isosorbide -hydrALAZINE   1 tablet Oral TID   levothyroxine   112 mcg Oral q AM   pantoprazole   40 mg Oral BID   potassium chloride   40 mEq Oral Q4H   senna  1 tablet Oral BID   Continuous Infusions:   LOS: 2 days    Time spent: 50 mins    Darcel Dawley, MD [facility]   If 7PM-7AM, please contact night-coverage  "

## 2024-06-30 NOTE — Plan of Care (Signed)

## 2024-06-30 NOTE — Progress Notes (Signed)
 "  Rounding Note   Patient Name: Evelyn Baker Date of Encounter: 06/30/2024  [facility] Cardiologist: Madonna Large, DO   Subjective Patient denies dyspnea or chest pain this morning.  Still with cough.  Scheduled Meds:  apixaban   5 mg Oral BID   azaTHIOprine   100 mg Oral Daily   carvedilol   6.25 mg Oral BID WC   ferrous sulfate   325 mg Oral TID WC   fluticasone  furoate-vilanterol  1 puff Inhalation Daily   folic acid   1 mg Oral Daily   furosemide   20 mg Intravenous Q12H   isosorbide -hydrALAZINE   1 tablet Oral TID   levothyroxine   112 mcg Oral q AM   pantoprazole   40 mg Oral BID   senna  1 tablet Oral BID   Continuous Infusions:  PRN Meds: acetaminophen  **OR** [DISCONTINUED] acetaminophen , albuterol , hydrALAZINE , ondansetron  **OR** ondansetron  (ZOFRAN ) IV, senna-docusate   Vital Signs  Vitals:   06/29/24 1540 06/29/24 1939 06/29/24 2323 06/30/24 0500  BP: 129/80 128/71 135/75 132/74  Pulse: 87  95 (!) 101  Resp: 20 18 18 18   Temp: 97.8 F (36.6 C) 98.6 F (37 C) 99.3 F (37.4 C) 98.6 F (37 C)  TempSrc: Oral Oral Oral Temporal  SpO2: 95% 98% 95% 98%  Weight:    87.2 kg  Height:        Intake/Output Summary (Last 24 hours) at 06/30/2024 0755 Last data filed at 06/30/2024 0500 Gross per 24 hour  Intake 360 ml  Output 1500 ml  Net -1140 ml      06/30/2024    5:00 AM 06/29/2024    6:25 AM 06/28/2024    3:33 PM  Last 3 Weights  Weight (lbs) 192 lb 3.9 oz 193 lb 2 oz 195 lb 1.7 oz  Weight (kg) 87.2 kg 87.6 kg 88.5 kg      Telemetry Sinus- Personally Reviewed   Physical Exam  GEN: No acute distress.   Neck: Supple Cardiac: RRR Respiratory: Clear to auscultation bilaterally. GI: Soft, nontender, non-distended  MS: No edema Neuro:  Nonfocal  Psych: Normal affect   Labs  Recent Labs  Lab 06/27/24 1502 06/27/24 1714  TRNPT 43* 39*       Chemistry Recent Labs  Lab 06/27/24 1502 06/29/24 0339 06/30/24 0422  NA 132* 134* 135  K 3.7  3.4* 3.4*  CL 92* 95* 96*  CO2 28 28 30   GLUCOSE 96 120* 94  BUN 35* 44* 36*  CREATININE 1.90* 1.60* 1.42*  CALCIUM  8.7* 8.8* 8.9  MG  --   --  1.9  PROT 7.1 6.6  --   ALBUMIN  2.8* 2.7*  --   AST 63* 74*  --   ALT 40 51*  --   ALKPHOS 84 73  --   BILITOT 0.7 0.3  --   GFRNONAA 30* 36* 42*  ANIONGAP 12 12 9      Hematology Recent Labs  Lab 06/27/24 1501 06/29/24 0339 06/30/24 0422  WBC 7.7 10.8* 7.8  RBC 2.84* 2.90* 2.96*  HGB 7.4* 7.5* 7.7*  HCT 21.0* 21.1* 21.6*  MCV 73.9* 72.8* 73.0*  MCH 26.1 25.9* 26.0  MCHC 35.2 35.5 35.6  RDW 19.7* 19.4* 19.7*  PLT 262 339 272    BNP Recent Labs  Lab 06/27/24 1506 06/29/24 0339  PROBNP 2,589.0* 1,724.0*      Radiology  ECHOCARDIOGRAM COMPLETE Result Date: 06/29/2024    ECHOCARDIOGRAM REPORT   Patient Name:   Evelyn Baker Date of Exam: 06/29/2024  Medical Rec #:  [id number]       Height:       65.0 in Accession #:    [id number]      Weight:       193.1 lb Date of Birth:  Jan 13, 1961       BSA:          1.949 m Patient Age:    64 years        BP:           136/80 mmHg Patient Gender: F               HR:           80 bpm. Exam Location:  Inpatient Procedure: 2D Echo, Cardiac Doppler and Color Doppler (Both Spectral and Color            Flow Doppler were utilized during procedure). Indications:    CHF - Acute Diastolic  History:        Patient has prior history of Echocardiogram examinations, most                 recent 03/17/2023. CHF, CAD, Signs/Symptoms:Edema, Shortness of                 Breath and Dyspnea; Risk Factors:Hypertension and Dyslipidemia.  Sonographer:    Juliene Rucks Referring Phys: [id number] JEFFREY T [facility]  Sonographer Comments: Patient is obese. IMPRESSIONS  1. Large pericardial effusion; IVC not dilated and no RV diastolic collapse to suggest tamponade.  2. Left ventricular ejection fraction, by estimation, is 60 to 65%. The left ventricle has normal function. The left ventricle has no regional wall motion  abnormalities. There is mild left ventricular hypertrophy. Left ventricular diastolic parameters are consistent with Grade I diastolic dysfunction (impaired relaxation).  3. Right ventricular systolic function is normal. The right ventricular size is normal.  4. Large pericardial effusion. There is no evidence of cardiac tamponade.  5. The mitral valve is normal in structure. Trivial mitral valve regurgitation. No evidence of mitral stenosis.  6. The aortic valve is tricuspid. Aortic valve regurgitation is not visualized. No aortic stenosis is present.  7. The inferior vena cava is normal in size with greater than 50% respiratory variability, suggesting right atrial pressure of 3 mmHg. FINDINGS  Left Ventricle: Left ventricular ejection fraction, by estimation, is 60 to 65%. The left ventricle has normal function. The left ventricle has no regional wall motion abnormalities. The left ventricular internal cavity size was normal in size. There is  mild left ventricular hypertrophy. Left ventricular diastolic parameters are consistent with Grade I diastolic dysfunction (impaired relaxation). Right Ventricle: The right ventricular size is normal. Right ventricular systolic function is normal. Left Atrium: Left atrial size was normal in size. Right Atrium: Right atrial size was normal in size. Pericardium: A large pericardial effusion is present. There is no evidence of cardiac tamponade. Mitral Valve: The mitral valve is normal in structure. Trivial mitral valve regurgitation. No evidence of mitral valve stenosis. Tricuspid Valve: The tricuspid valve is normal in structure. Tricuspid valve regurgitation is trivial. No evidence of tricuspid stenosis. Aortic Valve: The aortic valve is tricuspid. Aortic valve regurgitation is not visualized. No aortic stenosis is present. Pulmonic Valve: The pulmonic valve was normal in structure. Pulmonic valve regurgitation is trivial. No evidence of pulmonic stenosis. Aorta: The aortic  root is normal in size and structure. Venous: The inferior vena cava is normal in size with greater than 50% respiratory variability, suggesting right  atrial pressure of 3 mmHg. IAS/Shunts: No atrial level shunt detected by color flow Doppler. Additional Comments: Large pericardial effusion; IVC not dilated and no RV diastolic collapse to suggest tamponade.  LEFT VENTRICLE PLAX 2D LVIDd:         4.30 cm      Diastology LVIDs:         2.70 cm      LV e' medial:    6.85 cm/s LV PW:         1.20 cm      LV E/e' medial:  16.1 LV IVS:        1.10 cm      LV e' lateral:   6.64 cm/s LVOT diam:     1.90 cm      LV E/e' lateral: 16.6 LV SV:         59 LV SV Index:   30 LVOT Area:     2.84 cm  LV Volumes (MOD) LV vol d, MOD A2C: 128.0 ml LV vol d, MOD A4C: 106.0 ml LV vol s, MOD A2C: 61.3 ml LV vol s, MOD A4C: 45.8 ml LV SV MOD A2C:     66.7 ml LV SV MOD A4C:     106.0 ml LV SV MOD BP:      64.9 ml RIGHT VENTRICLE             IVC RV Basal diam:  2.90 cm     IVC diam: 2.00 cm RV Mid diam:    2.20 cm RV S prime:     13.40 cm/s TAPSE (M-mode): 1.8 cm LEFT ATRIUM             Index        RIGHT ATRIUM           Index LA diam:        2.80 cm 1.44 cm/m   RA Area:     15.80 cm LA Vol (A2C):   47.6 ml 24.42 ml/m  RA Volume:   41.60 ml  21.34 ml/m LA Vol (A4C):   24.2 ml 12.42 ml/m LA Biplane Vol: 35.2 ml 18.06 ml/m  AORTIC VALVE LVOT Vmax:   107.00 cm/s LVOT Vmean:  72.100 cm/s LVOT VTI:    0.207 m  AORTA Ao Root diam: 2.90 cm Ao Asc diam:  2.80 cm MITRAL VALVE                TRICUSPID VALVE MV Area (PHT): 3.72 cm     TR Peak grad:   23.2 mmHg MV Decel Time: 204 msec     TR Vmax:        241.00 cm/s MV E velocity: 110.00 cm/s MV A velocity: 107.00 cm/s  SHUNTS MV E/A ratio:  1.03         Systemic VTI:  0.21 m                             Systemic Diam: 1.90 cm Redell Shallow MD Electronically signed by Redell Shallow MD Signature Date/Time: 06/29/2024/9:10:21 AM    Final     Patient Profile   63 y.o. female Jehovah's  Witness, history of CVA, hypertension, hyperlipidemia, mixed connective tissue disease, rheumatoid arthritis for evaluation of pericardial effusion.  Venous ultrasound June 03, 2024 worrisome for left common femoral vein nonocclusive thrombus.  Follow-up VQ scan not suggestive of pulmonary embolus.  Echocardiogram December 31 showed normal LV function, mild  left ventricular hypertrophy, grade 1 diastolic dysfunction, large pericardial effusion but no evidence of tamponade.  Assessment & Plan   1 large pericardial effusion-etiology unclear.  Patient did have bronchitis earlier this month.  Question viral related.  Also with history of mixed connective tissue disease.  C-reactive protein elevated at 8.5 and sed rate 88.  Will add colchicine 0.6 mg twice daily.  Would like to avoid NSAID due to need for apixaban  and microcytic anemia.  May need steroid taper if cultures remain negative.  Would plan limited follow-up echocardiogram tomorrow to make sure effusion is not enlarging.  2 recent DVT-continue apixaban  for now. May need to hold if effusion worsens.  3 hypertension-patient's blood pressure is controlled.  Continue present medications.  4 acute diastolic congestive heart failure/pleural effusions-volume status has improved since admission.  Will continue present dose of Lasix  for now.  Follow renal function.  Can likely reduce dose tomorrow.  5 acute kidney injury-follow renal function with diuresis.  For questions or updates, please contact [facility] Please consult [email or web address] for contact info under       Signed, Redell Shallow, MD  06/30/2024, 7:55 AM    "

## 2024-06-30 NOTE — Progress Notes (Signed)
 Notified Dr. Leotis of temperatures throughout day including temperature this evening of 100.8. Also notified that daughter states she feels patient is more tired than usual and was slightly disoriented when she tried to ask her questions before this RN in room. Patient now appears alert and oriented x 4 and is carrying on normal conversation while sitting in chair eating dinner. No new orders received and MD states to continue monitoring.

## 2024-07-01 ENCOUNTER — Inpatient Hospital Stay (HOSPITAL_COMMUNITY)

## 2024-07-01 DIAGNOSIS — D631 Anemia in chronic kidney disease: Secondary | ICD-10-CM

## 2024-07-01 DIAGNOSIS — I509 Heart failure, unspecified: Secondary | ICD-10-CM | POA: Diagnosis not present

## 2024-07-01 DIAGNOSIS — I3139 Other pericardial effusion (noninflammatory): Secondary | ICD-10-CM | POA: Diagnosis not present

## 2024-07-01 DIAGNOSIS — I5033 Acute on chronic diastolic (congestive) heart failure: Secondary | ICD-10-CM

## 2024-07-01 DIAGNOSIS — N189 Chronic kidney disease, unspecified: Secondary | ICD-10-CM | POA: Diagnosis not present

## 2024-07-01 DIAGNOSIS — I82412 Acute embolism and thrombosis of left femoral vein: Secondary | ICD-10-CM

## 2024-07-01 LAB — RETICULOCYTES
Immature Retic Fract: 26.8 % — ABNORMAL HIGH (ref 2.3–15.9)
RBC.: 2.6 MIL/uL — ABNORMAL LOW (ref 3.87–5.11)
Retic Count, Absolute: 58.2 K/uL (ref 19.0–186.0)
Retic Ct Pct: 2.2 % (ref 0.4–3.1)

## 2024-07-01 LAB — BASIC METABOLIC PANEL WITH GFR
Anion gap: 8 (ref 5–15)
BUN: 31 mg/dL — ABNORMAL HIGH (ref 8–23)
CO2: 29 mmol/L (ref 22–32)
Calcium: 8.2 mg/dL — ABNORMAL LOW (ref 8.9–10.3)
Chloride: 95 mmol/L — ABNORMAL LOW (ref 98–111)
Creatinine, Ser: 1.56 mg/dL — ABNORMAL HIGH (ref 0.44–1.00)
GFR, Estimated: 37 mL/min — ABNORMAL LOW
Glucose, Bld: 94 mg/dL (ref 70–99)
Potassium: 3.9 mmol/L (ref 3.5–5.1)
Sodium: 132 mmol/L — ABNORMAL LOW (ref 135–145)

## 2024-07-01 LAB — ECHOCARDIOGRAM LIMITED
Height: 65 in
S' Lateral: 2.9 cm
Weight: 3091.2 [oz_av]

## 2024-07-01 LAB — CBC
HCT: 19.7 % — ABNORMAL LOW (ref 36.0–46.0)
Hemoglobin: 6.9 g/dL — CL (ref 12.0–15.0)
MCH: 25.8 pg — ABNORMAL LOW (ref 26.0–34.0)
MCHC: 35 g/dL (ref 30.0–36.0)
MCV: 73.8 fL — ABNORMAL LOW (ref 80.0–100.0)
Platelets: 244 K/uL (ref 150–400)
RBC: 2.67 MIL/uL — ABNORMAL LOW (ref 3.87–5.11)
RDW: 19.5 % — ABNORMAL HIGH (ref 11.5–15.5)
WBC: 7.6 K/uL (ref 4.0–10.5)
nRBC: 0 % (ref 0.0–0.2)

## 2024-07-01 LAB — IRON AND TIBC
Iron: 24 ug/dL — ABNORMAL LOW (ref 28–170)
Saturation Ratios: 14 % (ref 10.4–31.8)
TIBC: 169 ug/dL — ABNORMAL LOW (ref 250–450)
UIBC: 146 ug/dL

## 2024-07-01 LAB — PHOSPHORUS: Phosphorus: 2.1 mg/dL — ABNORMAL LOW (ref 2.5–4.6)

## 2024-07-01 LAB — MAGNESIUM: Magnesium: 1.7 mg/dL (ref 1.7–2.4)

## 2024-07-01 LAB — APTT: aPTT: 47 s — ABNORMAL HIGH (ref 24–36)

## 2024-07-01 LAB — FERRITIN: Ferritin: 1373 ng/mL — ABNORMAL HIGH (ref 11–307)

## 2024-07-01 MED ORDER — SODIUM CHLORIDE 0.9 % IV SOLN: INTRAVENOUS | Status: AC | PRN

## 2024-07-01 MED ORDER — DARBEPOETIN ALFA 100 MCG/0.5ML IJ SOSY
100.0000 ug | PREFILLED_SYRINGE | INTRAMUSCULAR | Status: DC
  Administered 2024-07-01: 100 ug via SUBCUTANEOUS
  Filled 2024-07-01: qty 0.5

## 2024-07-01 MED ORDER — HEPARIN (PORCINE) 25000 UT/250ML-% IV SOLN
1450.0000 [IU]/h | INTRAVENOUS | Status: DC
  Administered 2024-07-01: 1100 [IU]/h via INTRAVENOUS
  Administered 2024-07-02 – 2024-07-04 (×3): 1400 [IU]/h via INTRAVENOUS
  Administered 2024-07-05 – 2024-07-06 (×2): 1450 [IU]/h via INTRAVENOUS
  Filled 2024-07-01 (×6): qty 250

## 2024-07-01 MED ORDER — SODIUM PHOSPHATES 45 MMOLE/15ML IV SOLN
30.0000 mmol | Freq: Once | INTRAVENOUS | Status: AC
  Administered 2024-07-01: 30 mmol via INTRAVENOUS
  Filled 2024-07-01: qty 10

## 2024-07-01 MED ORDER — FUROSEMIDE 20 MG PO TABS
20.0000 mg | ORAL_TABLET | Freq: Two times a day (BID) | ORAL | Status: DC
  Administered 2024-07-01 – 2024-07-04 (×8): 20 mg via ORAL
  Filled 2024-07-01 (×8): qty 1

## 2024-07-01 NOTE — Progress Notes (Addendum)
 CRITICAL VALUE STICKER  CRITICAL VALUE: Hemoglobin 6.9  RECEIVER (on-site recipient of call): Donnice RN  DATE & TIME NOTIFIED: 0300  MESSENGER (representative from lab):  MD NOTIFIED: Sundil MD  TIME OF NOTIFICATION: 0310  RESPONSE: Acknowledged, no new orders as of this time   Addendum: patient is a [employer] Witness with a documented blood products refusal. Verbally confirmed this information with the patient.

## 2024-07-01 NOTE — Progress Notes (Signed)
 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 Hematology saw her during that admission, and she was dosed with IV Fe and epo.  She returned to [facility] 06/27/24 reporting a persistent cough since her discharge with gradually increasing exertional shortness of breath and severe orthopnea.  Patient was admitted for acute pulmonary edema started on IV Lasix .  Assessment & Plan:   Principal Problem:   CHF exacerbation (HCC) Active Problems:   Pulmonary edema cardiac cause (HCC)   Pericardial effusion   Acute / Subacute pulmonary edema of unclear etiology: Pericardial Effusion : Suspected newly diagnosed CHF. Continue to diurese with Lasix  20 mg IV q 12hr. Monitor daily weight, intake output charting. Monitor hemoglobin trend, follow BMP trend. Echocardiogram shows LVEF 60 to 65%, large pericardial effusion but no tamponade signs. Patient is hemodynamically stable,  no signs of tamponade. Cardiology is consulted.  Patient did have bronchitis earlier this month,  also has mixed connective tissue disease.   CRP and ESR elevated.  Cardiology recommended colchicine 0.6 mg twice daily,  would like to avoid NSAIDs due to Eliquis . Repeat echocardiogram showed increasing size of pericardial effusion with early signs of tamponade. Cardiology tentatively scheduled patient for pericardiocentesis on Monday,  pending improvement in hemoglobin.   Left femoral vein DVT: No evidence of PE during recent admission. Continue anticoagulation w/ Eliquis  . Patient has noted significant decrease in the swelling of her LLE while on anticoaguation.    AKI on CKD IIIa: Creatinine has fluctuated while being on diuresis.  Continue to monitor serum creatinine, avoid nephrotoxic medications.   Hyponatremia: Possibly due to volume overload - Follow trend w/ diuresis  Improved.  HTN: BP controlled at present - follow w/ diuresis.   Hyperlipidemia: Continue home medications.   Hypothyroidism: Continue levothyroxine .   MCTD: On chronic azathioprine .  Hypochromic normocytic anemia: Continue iron  supplementation. Hemoglobin dropped to 6.9.  Patient strictly refuses to have blood transfusion. Hematology consulted for recommendations given patient is tentatively scheduled for pericardiocentesis, pending improvement in hemoglobin.  DVT prophylaxis: Eliquis  Code Status:Full code Family Communication: Husband at bed side Disposition Plan:     Status is: Inpatient Remains inpatient appropriate because: Patient admitted for CHF exacerbation,  also have anemia.  Found to have large pericardial effusion,  cardiology is consulted.  Repeat echo shows increasing size of pericardial effusion with early signs of tamponade.  Cardiology tentatively scheduled patient on Monday for pericardiocentesis.  Patient is not medically ready for discharge.    Consultants:   Cardiology Hematology  Procedures: Echocardiogram.  Antimicrobials: Anti-infectives (From admission, onward)    None      Subjective: Patient was seen and examined at bedside.  Overnight events noted. Patient was sitting comfortably,  denies any chest pain,  shortness of breath.   She is hemodynamically stable.  She refuses blood transfusion.  Objective: Vitals:   06/30/24 2335 07/01/24 0308 07/01/24 0823 07/01/24 1114  BP: (!) 144/73 (!) 144/81 129/78 128/78  Pulse: 94 94 (!) 106 84  Resp: 18 18 20 20   Temp: 100 F (37.8 C) 99.7 F (37.6 C) 99 F (37.2 C) 98.6 F (37 C)  TempSrc: Oral Oral Oral Oral  SpO2: 95% 95% 97% 97%  Weight:      Height:         Intake/Output Summary (Last 24 hours) at 07/01/2024 1247 Last data filed at 07/01/2024 0800 Gross per 24 hour  Intake 720 ml  Output 1800 ml  Net -1080 ml   Filed Weights   06/29/24 0625 06/30/24 0500 06/30/24 0822  Weight: 87.6 kg 87.2 kg 87.6 kg    Examination:  General exam: Appears calm and comfortable, not in any acute distress. Respiratory system: CTA Bilaterally. Respiratory effort normal. RR 15 Cardiovascular system: S1 & S2 heard, RRR. No JVD, murmurs, rubs, gallops or clicks. No pedal edema. Gastrointestinal system: Abdomen is non distended, soft and non tender.  Normal bowel sounds heard. Central nervous system: Alert and oriented x 3. No focal neurological deficits. Extremities: Edema+, no cyanosis, no clubbing. Skin: No rashes, lesions or ulcers Psychiatry: Judgement and insight appear normal. Mood & affect appropriate.   Data Reviewed: I have personally reviewed following labs and imaging studies  CBC: Recent Labs  Lab 06/27/24 1501 06/29/24 0339 06/30/24 0422 07/01/24 0210  WBC 7.7 10.8* 7.8 7.6  HGB 7.4* 7.5* 7.7* 6.9*  HCT 21.0* 21.1* 21.6* 19.7*  MCV 73.9* 72.8* 73.0* 73.8*  PLT 262 339 272 244   Basic Metabolic Panel: Recent Labs  Lab 06/27/24 1502 06/29/24 0339 06/30/24 0422 07/01/24 0210  NA 132* 134* 135 132*  K 3.7 3.4* 3.4* 3.9  CL 92* 95* 96* 95*  CO2 28 28 30 29   GLUCOSE 96 120* 94 94  BUN 35* 44* 36* 31*  CREATININE 1.90* 1.60* 1.42* 1.56*  CALCIUM  8.7* 8.8* 8.9 8.2*  MG  --   --  1.9 1.7  PHOS  --   --  2.5 2.1*   GFR: Estimated Creatinine Clearance: 41.4 mL/min (A) (by C-G formula based on SCr of 1.56 mg/dL (H)). Liver Function Tests: Recent Labs  Lab 06/27/24 1502 06/29/24 0339  AST 63* 74*  ALT 40 51*  ALKPHOS 84 73  BILITOT 0.7 0.3  PROT 7.1 6.6  ALBUMIN  2.8* 2.7*   No results for input(s): LIPASE, AMYLASE in the last 168 hours. No results for input(s): AMMONIA in the last 168 hours. Coagulation  Profile: No results for input(s): INR, PROTIME in the last 168 hours. Cardiac Enzymes: No results for input(s): CKTOTAL, CKMB, CKMBINDEX, TROPONINI in the last 168 hours. BNP (last 3 results) Recent Labs    06/27/24 1506 06/29/24 0339  PROBNP 2,589.0* 1,724.0*   HbA1C: No results for input(s): HGBA1C in the last 72 hours. CBG: No results for input(s): GLUCAP in the last 168 hours. Lipid Profile: No results for input(s): CHOL, HDL, LDLCALC, TRIG, CHOLHDL, LDLDIRECT in the last 72 hours. Thyroid  Function Tests: No results for input(s): TSH, T4TOTAL, FREET4, T3FREE, THYROIDAB in the last 72 hours. Anemia Panel: No results for input(s): VITAMINB12, FOLATE, FERRITIN, TIBC, IRON , RETICCTPCT in the last 72 hours. Sepsis Labs: Recent Labs  Lab 06/27/24 1506  LATICACIDVEN 0.8    Recent Results (from the past 240 hours)  Blood culture (routine x 2)     Status: None (Preliminary result)   Collection Time: 06/27/24  3:06 PM   Specimen: BLOOD  Result Value Ref Range Status   Specimen Description   Final    BLOOD LEFT ANTECUBITAL Performed at [facility], [address] [place], [place] [address]    Special Requests   Final    Blood Culture results may not be optimal due to  an inadequate volume of blood received in culture bottles BOTTLES DRAWN AEROBIC AND ANAEROBIC Performed at [facility], [address] [place], [place] [address]    Culture   Final    NO GROWTH 4 DAYS Performed at [facility], [address], [place], [place] [address]    Report Status PENDING  Incomplete    Radiology Studies: ECHOCARDIOGRAM LIMITED Result Date: 07/01/2024    ECHOCARDIOGRAM LIMITED REPORT   Patient Name:   Evelyn Baker Date of Exam: 07/01/2024 Medical Rec #:  [id number]       Height:       65.0 in Accession #:    [id number]      Weight:       193.2 lb Date of Birth:  30-Sep-1960       BSA:           1.949 m Patient Age:    63 years        BP:           129/78 mmHg Patient Gender: F               HR:           87 bpm. Exam Location:  Inpatient Procedure: Limited Echo, Cardiac Doppler and Color Doppler (Both Spectral and            Color Flow Doppler were utilized during procedure). Indications:    Pericardial effusion I31.3  History:        Patient has prior history of Echocardiogram examinations, most                 recent 06/29/2024. Risk Factors:Hypertension.  Sonographer:    Jayson Gaskins Referring Phys: [id number] ARUN K THUKKANI IMPRESSIONS  1. Left ventricular ejection fraction, by estimation, is 60 to 65%. The left ventricle has normal function. The left ventricle has no regional wall motion abnormalities.  2. Right ventricular systolic function is normal. The right ventricular size is normal.  3. No evidence of RV diastolic collapse but there is clinically significant respiratory variation across the mitral and tricuspid valves which has worsened since prevoius echo. Additionally effusion is now larger. Discussed with interventional and exam and echo not consistent with overt cardiac tamponade. Would repeat echo in the next 48-72 hours or clinical change. Large pericardial effusion.  4. The mitral valve is normal in structure. No evidence of mitral valve regurgitation. FINDINGS  Left Ventricle: Left ventricular ejection fraction, by estimation, is 60 to 65%. The left ventricle has normal function. The left ventricle has no regional wall motion abnormalities. The left ventricular internal cavity size was normal in size. There is  no left ventricular hypertrophy. Right Ventricle: The right ventricular size is normal. Right vetricular wall thickness was not well visualized. Right ventricular systolic function is normal. Pericardium: No evidence of RV diastolic collapse but there is clinically significant respiratory variation across the mitral and tricuspid valves which has worsened since prevoius echo.  Additionally effusion is now larger. Discussed with interventional and exam and echo not consistent with overt cardiac tamponade. Would repeat echo in the next 48-72 hours or clinical change. A large pericardial effusion is present. Mitral Valve: The mitral valve is normal in structure. Tricuspid Valve: The tricuspid valve is normal in structure. Tricuspid valve regurgitation is not demonstrated. LEFT VENTRICLE PLAX 2D LVIDd:         4.10 cm LVIDs:         2.90 cm LV PW:  1.30 cm LV IVS:        1.00 cm LVOT diam:     1.85 cm LVOT Area:     2.69 cm  LEFT ATRIUM             Index LA Vol (A2C):   35.8 ml 18.37 ml/m LA Vol (A4C):   36.8 ml 18.88 ml/m LA Biplane Vol: 36.6 ml 18.78 ml/m   SHUNTS Systemic Diam: 1.85 cm Morene Brownie Electronically signed by Morene Brownie Signature Date/Time: 07/01/2024/11:03:15 AM    Final    Scheduled Meds:  azaTHIOprine   100 mg Oral Daily   carvedilol   6.25 mg Oral BID WC   colchicine  0.6 mg Oral BID   ferrous sulfate   325 mg Oral TID WC   fluticasone  furoate-vilanterol  1 puff Inhalation Daily   folic acid   1 mg Oral Daily   furosemide   20 mg Oral BID   isosorbide -hydrALAZINE   1 tablet Oral TID   levothyroxine   112 mcg Oral q AM   pantoprazole   40 mg Oral BID   senna  1 tablet Oral BID   Continuous Infusions:  sodium chloride  10 mL/hr at 07/01/24 1124   heparin      sodium PHOSPHATE  IVPB (in mmol) 30 mmol (07/01/24 1129)     LOS: 3 days    Time spent: 50 mins    Darcel Dawley, MD [facility]   If 7PM-7AM, please contact night-coverage  "

## 2024-07-01 NOTE — Progress Notes (Signed)
 PHARMACY - ANTICOAGULATION CONSULT NOTE  Pharmacy Consult for heparin  Indication: DVT  Allergies[1]  Patient Measurements: Height: 5' 5 (165.1 cm) Weight: 87.6 kg (193 lb 3.2 oz) IBW/kg (Calculated) : 57 HEPARIN  DW (KG): 76.4  Vital Signs: Temp: 98.6 F (37 C) (01/02 1114) Temp Source: Oral (01/02 1114) BP: 128/78 (01/02 1114) Pulse Rate: 84 (01/02 1114)  Labs: Recent Labs    06/29/24 0339 06/30/24 0422 07/01/24 0210  HGB 7.5* 7.7* 6.9*  HCT 21.1* 21.6* 19.7*  PLT 339 272 244  CREATININE 1.60* 1.42* 1.56*    Estimated Creatinine Clearance: 41.4 mL/min (A) (by C-G formula based on SCr of 1.56 mg/dL (H)).   Medical History: Past Medical History:  Diagnosis Date   Asthma    Depression    Dyspnea    Hyperlipidemia    Hypertension    Hypothyroidism    Sciatic nerve disease    Thyroid  disease     Medications:   azaTHIOprine   100 mg Oral Daily   carvedilol   6.25 mg Oral BID WC   colchicine  0.6 mg Oral BID   ferrous sulfate   325 mg Oral TID WC   fluticasone  furoate-vilanterol  1 puff Inhalation Daily   folic acid   1 mg Oral Daily   furosemide   20 mg Oral BID   isosorbide -hydrALAZINE   1 tablet Oral TID   levothyroxine   112 mcg Oral q AM   pantoprazole   40 mg Oral BID   senna  1 tablet Oral BID     Assessment: Evelyn Baker is a 84 YOF on Eliquis  PTA for recent DVT in left femoral vein detected on 06/03/24. Eliquis  continued this admission. Patient also noted to have a pericardial effusion on TTE, noted to be larger on 07/01/24. Hgb also low to 6.9 mg/dL today, patient noted to be Jehovah witness which is restricting blood transfusion per MD. Plan to transition patient from Eliquis  to heparin  today. Pharmacy consulted for heparin  dosing. Last dose of Eliquis  administered on 06/30/24 at 2100.  07/01/24: Hgb down to 6.9 mg/dL today, patient noted to be Jehovah witness which is restricting blood transfusion per MD. Due to recent Eliquis  administration, will monitor aPTT  and anti-Xa levels until correlating.   Goal of Therapy:  Heparin  level 0.3-0.7 units/ml Monitor platelets by anticoagulation protocol: Yes   Plan:  Start heparin  1100 units/hr - no bolus with recent Eliquis  administration Monitor aPTT in 8 hours Monitor anti-Xa level and aPTT daily until correlating Monitor CBC and s/sx of bleeding daily  Evelyn Baker, Evelyn Baker, BCPS PGY2 Cardiology Pharmacy Resident 07/01/2024 11:54 AM    [1]  Allergies Allergen Reactions   Shellfish Allergy Anaphylaxis and Swelling   Avocado Swelling    Lips   Codeine  Other (See Comments)    Constipation   Iodinated Contrast Media Other (See Comments)   Mushroom Other (See Comments)    Itchy throat   Pineapple Swelling    Lip swelling    Penicillins Rash    Reaction: Childhood

## 2024-07-01 NOTE — Progress Notes (Addendum)
 Evelyn Baker   DOB:1961/05/20   FM#:[id number]    I saw the patient, examined her and edited the notes as follows I reviewed plan of care with the patient and family and she agreed to proceed with ESA injection and repeat labs to check for iron  Overall spent 50 minutes today on this case  Almarie Bedford, MD   CLINICAL SUMMARY:  Evelyn Baker is a 64 year old female patient who presented on 06/28/2024 with complaints of shortness of breath and persistent cough since previous discharge on 06/13/2024.  Patient was previously seen by hematology for anemia.  Hematology following.   ASSESSMENT & PLAN:  Anemia due to chronic kidney disease History of iron  deficiency anemia - Hemoglobin low 6.9 today. - Previous iron  studies showed low iron  and sat - Patient is Jehovah witness and does not accept blood products. - She received IV iron  infusions, folate, Retacrit  20,000 units and B12 injection during last admission.  Last dose of ESA was on December 15 - Will order additional heme therapy. - Patient seen during last hospitalization in December 2025.  Also seen during hematology outpatient visit on 06/20/2024.  Hemoglobin had improved at that time to 8.0. - Continue folic acid  supplementation. We discussed risk and benefits of erythropoietin  stimulating agent and she agreed to proceed I will repeat iron  studies today and if her iron  studies confirm persistent iron  deficiency, she can benefit from further intravenous iron  infusion. Overall, I suspect she will need long-term ESA and intermittent doses of IV iron  It is unlikely I can bring her hemoglobin up by Monday next week; typical response rate at best is seen 4 to 5 days after ESA injection, could take more than 7 days in some situations  Pericardial effusion Pulmonary edema Suspect CHF, newly diagnosed - Echocardiogram shows increasing size of pericardial effusion - Eliquis  was held and patient now receiving IV heparin . - Cardio following  and there is a plan for pericardiocentesis on Monday.  History of DVT, LLE - Patient was discharged home on Eliquis  and reports that she has been compliant with medications.  States that swelling has gone down and she no longer has any pain in lower extremities. - Continue wearing compression stockings - On IV heparin , monitor per protocol  History of CKD - Avoid nephrotoxic agents - Patient was instructed during heme outpatient visit 12/22 to hold valsartan  and chlorothiazide until after nephrology follow-up in January. - Follow-up closely with nephrology    Code Status Full  Subjective:  Patient seen awake and alert sitting up in bed.  Spouse at bedside.  Patient states that she was discharged home previously on 06/13/2024 and could not to get comfortable and could not get out of bed.  This became progressively worse with cough and she decided to get reevaluated.  Denies chest pain or chest tightness.  Objective:   Intake/Output Summary (Last 24 hours) at 07/01/2024 1411 Last data filed at 07/01/2024 0800 Gross per 24 hour  Intake 720 ml  Output 1800 ml  Net -1080 ml     PHYSICAL EXAMINATION: ECOG PERFORMANCE STATUS: 2 - Symptomatic, <50% confined to bed  Vitals:   07/01/24 0823 07/01/24 1114  BP: 129/78 128/78  Pulse: (!) 106 84  Resp: 20 20  Temp: 99 F (37.2 C) 98.6 F (37 C)  SpO2: 97% 97%   Filed Weights   06/29/24 0625 06/30/24 0500 06/30/24 0822  Weight: 193 lb 2 oz (87.6 kg) 192 lb 3.9 oz (87.2 kg) 193 lb 3.2 oz (  87.6 kg)    GENERAL: alert, no distress and comfortable SKIN: skin color, texture, turgor are normal, no rashes or significant lesions EYES: normal, conjunctiva are pink and non-injected, sclera clear OROPHARYNX: no exudate, no erythema and lips, buccal mucosa, and tongue normal  NECK: supple, thyroid  normal size, non-tender, without nodularity LYMPH: no palpable lymphadenopathy in the cervical, axillary or inguinal LUNGS: clear to auscultation  and percussion with normal breathing effort HEART: regular rate & rhythm and no murmurs and no lower extremity edema ABDOMEN: abdomen soft, non-tender and normal bowel sounds MUSCULOSKELETAL: no cyanosis of digits and no clubbing  PSYCH: alert & oriented x 3 with fluent speech NEURO: no focal motor/sensory deficits   All questions were answered. The patient knows to call the clinic with any problems, questions or concerns.   I personally spent a total of 40 minutes minutes in the care of the patient today including preparing to see the patient, getting/reviewing separately obtained history, performing a medically appropriate exam/evaluation, referring and communicating with other health care professionals, documenting clinical information in the EHR, communicating results, and coordinating care.    Olam PARAS Rouson, NP 07/01/2024 2:11 PM    Labs Reviewed:  Lab Results  Component Value Date   WBC 7.6 07/01/2024   HGB 6.9 (LL) 07/01/2024   HCT 19.7 (L) 07/01/2024   MCV 73.8 (L) 07/01/2024   PLT 244 07/01/2024   Recent Labs    06/13/24 0430 06/27/24 1502 06/29/24 0339 06/30/24 0422 07/01/24 0210  NA 132* 132* 134* 135 132*  K 3.6 3.7 3.4* 3.4* 3.9  CL 98 92* 95* 96* 95*  CO2 25 28 28 30 29   GLUCOSE 94 96 120* 94 94  BUN 19 35* 44* 36* 31*  CREATININE 1.26* 1.90* 1.60* 1.42* 1.56*  CALCIUM  8.8* 8.7* 8.8* 8.9 8.2*  GFRNONAA 48* 30* 36* 42* 37*  PROT 6.6 7.1 6.6  --   --   ALBUMIN  3.2* 2.8* 2.7*  --   --   AST 43* 63* 74*  --   --   ALT 32 40 51*  --   --   ALKPHOS 144* 84 73  --   --   BILITOT 0.4 0.7 0.3  --   --   BILIDIR  --  0.2  --   --   --   IBILI  --  0.6  --   --   --     Studies Reviewed:   ECHOCARDIOGRAM LIMITED Result Date: 07/01/2024    ECHOCARDIOGRAM LIMITED REPORT   Patient Name:   Evelyn Baker Date of Exam: 07/01/2024 Medical Rec #:  [id number]       Height:       65.0 in Accession #:    [id number]      Weight:       193.2 lb Date of Birth:  1961-05-30        BSA:          1.949 m Patient Age:    63 years        BP:           129/78 mmHg Patient Gender: F               HR:           87 bpm. Exam Location:  Inpatient Procedure: Limited Echo, Cardiac Doppler and Color Doppler (Both Spectral and            Color Flow Doppler were utilized during procedure).  Indications:    Pericardial effusion I31.3  History:        Patient has prior history of Echocardiogram examinations, most                 recent 06/29/2024. Risk Factors:Hypertension.  Sonographer:    Jayson Gaskins Referring Phys: [id number] ARUN K THUKKANI IMPRESSIONS  1. Left ventricular ejection fraction, by estimation, is 60 to 65%. The left ventricle has normal function. The left ventricle has no regional wall motion abnormalities.  2. Right ventricular systolic function is normal. The right ventricular size is normal.  3. No evidence of RV diastolic collapse but there is clinically significant respiratory variation across the mitral and tricuspid valves which has worsened since prevoius echo. Additionally effusion is now larger. Discussed with interventional and exam and echo not consistent with overt cardiac tamponade. Would repeat echo in the next 48-72 hours or clinical change. Large pericardial effusion.  4. The mitral valve is normal in structure. No evidence of mitral valve regurgitation. FINDINGS  Left Ventricle: Left ventricular ejection fraction, by estimation, is 60 to 65%. The left ventricle has normal function. The left ventricle has no regional wall motion abnormalities. The left ventricular internal cavity size was normal in size. There is  no left ventricular hypertrophy. Right Ventricle: The right ventricular size is normal. Right vetricular wall thickness was not well visualized. Right ventricular systolic function is normal. Pericardium: No evidence of RV diastolic collapse but there is clinically significant respiratory variation across the mitral and tricuspid valves which has worsened since  prevoius echo. Additionally effusion is now larger. Discussed with interventional and exam and echo not consistent with overt cardiac tamponade. Would repeat echo in the next 48-72 hours or clinical change. A large pericardial effusion is present. Mitral Valve: The mitral valve is normal in structure. Tricuspid Valve: The tricuspid valve is normal in structure. Tricuspid valve regurgitation is not demonstrated. LEFT VENTRICLE PLAX 2D LVIDd:         4.10 cm LVIDs:         2.90 cm LV PW:         1.30 cm LV IVS:        1.00 cm LVOT diam:     1.85 cm LVOT Area:     2.69 cm  LEFT ATRIUM             Index LA Vol (A2C):   35.8 ml 18.37 ml/m LA Vol (A4C):   36.8 ml 18.88 ml/m LA Biplane Vol: 36.6 ml 18.78 ml/m   SHUNTS Systemic Diam: 1.85 cm Morene Brownie Electronically signed by Morene Brownie Signature Date/Time: 07/01/2024/11:03:15 AM    Final    ECHOCARDIOGRAM COMPLETE Result Date: 06/29/2024    ECHOCARDIOGRAM REPORT   Patient Name:   ENOLIA KOEPKE Date of Exam: 06/29/2024 Medical Rec #:  [id number]       Height:       65.0 in Accession #:    [id number]      Weight:       193.1 lb Date of Birth:  03/29/1961       BSA:          1.949 m Patient Age:    63 years        BP:           136/80 mmHg Patient Gender: F               HR:           80  bpm. Exam Location:  Inpatient Procedure: 2D Echo, Cardiac Doppler and Color Doppler (Both Spectral and Color            Flow Doppler were utilized during procedure). Indications:    CHF - Acute Diastolic  History:        Patient has prior history of Echocardiogram examinations, most                 recent 03/17/2023. CHF, CAD, Signs/Symptoms:Edema, Shortness of                 Breath and Dyspnea; Risk Factors:Hypertension and Dyslipidemia.  Sonographer:    Juliene Rucks Referring Phys: [id number] JEFFREY T [facility]  Sonographer Comments: Patient is obese. IMPRESSIONS  1. Large pericardial effusion; IVC not dilated and no RV diastolic collapse to suggest tamponade.  2. Left  ventricular ejection fraction, by estimation, is 60 to 65%. The left ventricle has normal function. The left ventricle has no regional wall motion abnormalities. There is mild left ventricular hypertrophy. Left ventricular diastolic parameters are consistent with Grade I diastolic dysfunction (impaired relaxation).  3. Right ventricular systolic function is normal. The right ventricular size is normal.  4. Large pericardial effusion. There is no evidence of cardiac tamponade.  5. The mitral valve is normal in structure. Trivial mitral valve regurgitation. No evidence of mitral stenosis.  6. The aortic valve is tricuspid. Aortic valve regurgitation is not visualized. No aortic stenosis is present.  7. The inferior vena cava is normal in size with greater than 50% respiratory variability, suggesting right atrial pressure of 3 mmHg. FINDINGS  Left Ventricle: Left ventricular ejection fraction, by estimation, is 60 to 65%. The left ventricle has normal function. The left ventricle has no regional wall motion abnormalities. The left ventricular internal cavity size was normal in size. There is  mild left ventricular hypertrophy. Left ventricular diastolic parameters are consistent with Grade I diastolic dysfunction (impaired relaxation). Right Ventricle: The right ventricular size is normal. Right ventricular systolic function is normal. Left Atrium: Left atrial size was normal in size. Right Atrium: Right atrial size was normal in size. Pericardium: A large pericardial effusion is present. There is no evidence of cardiac tamponade. Mitral Valve: The mitral valve is normal in structure. Trivial mitral valve regurgitation. No evidence of mitral valve stenosis. Tricuspid Valve: The tricuspid valve is normal in structure. Tricuspid valve regurgitation is trivial. No evidence of tricuspid stenosis. Aortic Valve: The aortic valve is tricuspid. Aortic valve regurgitation is not visualized. No aortic stenosis is present.  Pulmonic Valve: The pulmonic valve was normal in structure. Pulmonic valve regurgitation is trivial. No evidence of pulmonic stenosis. Aorta: The aortic root is normal in size and structure. Venous: The inferior vena cava is normal in size with greater than 50% respiratory variability, suggesting right atrial pressure of 3 mmHg. IAS/Shunts: No atrial level shunt detected by color flow Doppler. Additional Comments: Large pericardial effusion; IVC not dilated and no RV diastolic collapse to suggest tamponade.  LEFT VENTRICLE PLAX 2D LVIDd:         4.30 cm      Diastology LVIDs:         2.70 cm      LV e' medial:    6.85 cm/s LV PW:         1.20 cm      LV E/e' medial:  16.1 LV IVS:        1.10 cm      LV e' lateral:  6.64 cm/s LVOT diam:     1.90 cm      LV E/e' lateral: 16.6 LV SV:         59 LV SV Index:   30 LVOT Area:     2.84 cm  LV Volumes (MOD) LV vol d, MOD A2C: 128.0 ml LV vol d, MOD A4C: 106.0 ml LV vol s, MOD A2C: 61.3 ml LV vol s, MOD A4C: 45.8 ml LV SV MOD A2C:     66.7 ml LV SV MOD A4C:     106.0 ml LV SV MOD BP:      64.9 ml RIGHT VENTRICLE             IVC RV Basal diam:  2.90 cm     IVC diam: 2.00 cm RV Mid diam:    2.20 cm RV S prime:     13.40 cm/s TAPSE (M-mode): 1.8 cm LEFT ATRIUM             Index        RIGHT ATRIUM           Index LA diam:        2.80 cm 1.44 cm/m   RA Area:     15.80 cm LA Vol (A2C):   47.6 ml 24.42 ml/m  RA Volume:   41.60 ml  21.34 ml/m LA Vol (A4C):   24.2 ml 12.42 ml/m LA Biplane Vol: 35.2 ml 18.06 ml/m  AORTIC VALVE LVOT Vmax:   107.00 cm/s LVOT Vmean:  72.100 cm/s LVOT VTI:    0.207 m  AORTA Ao Root diam: 2.90 cm Ao Asc diam:  2.80 cm MITRAL VALVE                TRICUSPID VALVE MV Area (PHT): 3.72 cm     TR Peak grad:   23.2 mmHg MV Decel Time: 204 msec     TR Vmax:        241.00 cm/s MV E velocity: 110.00 cm/s MV A velocity: 107.00 cm/s  SHUNTS MV E/A ratio:  1.03         Systemic VTI:  0.21 m                             Systemic Diam: 1.90 cm Redell Shallow MD  Electronically signed by Redell Shallow MD Signature Date/Time: 06/29/2024/9:10:21 AM    Final    DG Chest 2 View Result Date: 06/27/2024 EXAM: 2 VIEW(S) XRAY OF THE CHEST 06/27/2024 03:49:00 PM COMPARISON: 06/05/2024 CLINICAL HISTORY: chest pain FINDINGS: LUNGS AND PLEURA: Mild pulmonary edema. New bilateral small pleural effusions with associated atelectasis. No pneumothorax. HEART AND MEDIASTINUM: Mild cardiomegaly. BONES AND SOFT TISSUES: No acute osseous abnormality. IMPRESSION: 1. Mild pulmonary edema with new bilateral small pleural effusions and associated atelectasis. 2. Mild cardiomegaly. Electronically signed by: Greig Pique MD 06/27/2024 06:05 PM EST RP Workstation: [id number]   CT ABDOMEN PELVIS WO CONTRAST Result Date: 06/10/2024 CLINICAL DATA:  Anemia.  Concern for retroperitoneal hematoma. EXAM: CT ABDOMEN AND PELVIS WITHOUT CONTRAST TECHNIQUE: Multidetector CT imaging of the abdomen and pelvis was performed following the standard protocol without IV contrast. RADIATION DOSE REDUCTION: This exam was performed according to the departmental dose-optimization program which includes automated exposure control, adjustment of the mA and/or kV according to patient size and/or use of iterative reconstruction technique. COMPARISON:  CT abdomen and pelvis dated 03/20/2024 FINDINGS: Lower chest: Bilateral lower lobe relaxation atelectasis. Trace  right and small left pleural effusions. Partially imaged heart size is normal. Trace pericardial effusion. Hepatobiliary: No focal hepatic lesions. No intra or extrahepatic biliary ductal dilation. Normal gallbladder. Pancreas: No focal lesions or main ductal dilation. Spleen: Normal in size without focal abnormality. Adrenals/Urinary Tract: No adrenal nodules. No suspicious renal mass on this noncontrast enhanced examination, calculi or hydronephrosis. No focal bladder wall thickening. Stomach/Bowel: Small hiatal hernia. Normal appearance of the stomach. No  evidence of bowel wall thickening, distention, or inflammatory changes. Small and large bowel are diffusely underdistended. Normal appendix. Vascular/Lymphatic: Aortic atherosclerosis. Superficial varicose veins anterior to the pubis. No enlarged abdominal or pelvic lymph nodes. Reproductive: No adnexal masses. Other: Trace presacral free fluid.  No free air or fluid collection. Musculoskeletal: No acute or abnormal lytic or blastic osseous lesions. Diffuse body wall edema. IMPRESSION: 1. No acute abdominopelvic abnormality. No evidence of retroperitoneal hematoma. 2. Findings of volume overload with trace right and small left pleural effusions, diffuse body wall edema, trace presacral edema, and pericardial effusion. 3.  Aortic Atherosclerosis (ICD10-I70.0). Electronically Signed   By: Limin  Xu M.D.   On: 06/10/2024 16:05   NM Pulmonary Perfusion Result Date: 06/06/2024 CLINICAL DATA:  Shortness of breath on minimal exertion. Deep venous thrombosis. EXAM: NUCLEAR MEDICINE PERFUSION LUNG SCAN TECHNIQUE: Perfusion images were obtained in multiple projections after intravenous injection of radiopharmaceutical. No ventilation imaging obtained. RADIOPHARMACEUTICALS:  4.4 mCi Tc-69m MAA IV COMPARISON:  Chest radiographs 06/05/2024.  Chest CT 03/22/2024 FINDINGS: The pulmonary perfusion is within normal limits. There are no wedge-shaped perfusion defects to suggest acute pulmonary embolism. IMPRESSION: No evidence of acute pulmonary embolism on perfusion scintigraphy by PISAPED criteria. Electronically Signed   By: Elsie Perone M.D.   On: 06/06/2024 15:36   DG CHEST PORT 1 VIEW Result Date: 06/05/2024 EXAM: 1 VIEW(S) XRAY OF THE CHEST 06/05/2024 08:56:00 PM COMPARISON: 06/04/2024 CLINICAL HISTORY: Respiratory abnormality FINDINGS: LUNGS AND PLEURA: Mild pulmonary edema. Mild bilateral interstitial prominence. Mild bilateral peribronchial cuffing. No pleural effusion. No pneumothorax. HEART AND MEDIASTINUM:  Borderline cardiomegaly. No acute abnormality of the mediastinal silhouette. BONES AND SOFT TISSUES: No acute osseous abnormality. IMPRESSION: 1. Mild pulmonary edema. Electronically signed by: Morgane Naveau MD 06/05/2024 11:15 PM EST RP Workstation: [id number]   US  RENAL Result Date: 06/04/2024 EXAM: US  Retroperitoneum Complete, Renal. 06/04/2024 05:56:29 PM TECHNIQUE: Real-time ultrasonography of the retroperitoneum renal was performed. COMPARISON: None available CLINICAL HISTORY: Acute renal failure. FINDINGS: FINDINGS: RIGHT KIDNEY/URETER: Right kidney measures 12.5 x 4.9 x 5.7 cm. Mildly increased echotexture. No hydronephrosis. No calculus. No mass. LEFT KIDNEY/URETER: Left kidney measures 11.5 x 5.4 x 4.5 cm. Mildly increased echotexture. No hydronephrosis. No calculus. No mass. BLADDER: Unremarkable appearance of the bladder. IMPRESSION: 1. Mildly increased renal cortical echotexture bilaterally, which can be seen with medical renal disease or acute kidney injury. Electronically signed by: Franky Crease MD 06/04/2024 07:41 PM EST RP Workstation: [id number]   DG Chest Port 1 View Result Date: 06/04/2024 EXAM: 1 VIEW(S) XRAY OF THE CHEST 06/04/2024 09:11:00 AM COMPARISON: 03/20/2024 CLINICAL HISTORY: Shortness of breath. FINDINGS: LUNGS AND PLEURA: Airway thickening suggests bronchitis or reactive airways disease. Mild upper zone pulmonary vascular prominence, cannot exclude pulmonary venous hypertension. No focal pulmonary opacity. No pleural effusion. No pneumothorax. HEART AND MEDIASTINUM: Mild cardiomegaly. No acute abnormality of the mediastinal silhouette. BONES AND SOFT TISSUES: No acute osseous abnormality. IMPRESSION: 1. Airway thickening suggestive of bronchitis or reactive airways disease. 2. Mild upper zone pulmonary vascular prominence, cannot exclude pulmonary venous hypertension.  3. Mild cardiomegaly. Electronically signed by: Ryan Salvage MD 06/04/2024 01:17 PM EST RP Workstation:  [id number]   US  Venous Img Lower  Left (DVT Study) Result Date: 06/03/2024 CLINICAL DATA:  left leg edema EXAM: LEFT LOWER EXTREMITY VENOUS DOPPLER ULTRASOUND TECHNIQUE: Gray-scale sonography with graded compression, as well as color Doppler and duplex ultrasound were performed to evaluate the lower extremity deep venous systems from the level of the common femoral vein and including the common femoral, femoral, profunda femoral, popliteal and calf veins including the posterior tibial, peroneal and gastrocnemius veins when visible. The superficial great saphenous vein was also interrogated. Spectral Doppler was utilized to evaluate flow at rest and with distal augmentation maneuvers in the common femoral, femoral and popliteal veins. COMPARISON:  None Available. FINDINGS: Contralateral Common Femoral Vein: Respiratory phasicity is normal and symmetric with the symptomatic side. No evidence of thrombus. Normal compressibility. Common Femoral Vein: Partial compressibility of the common femoral vein. Vascular Doppler flow is present. Saphenofemoral Junction: No evidence of thrombus. Normal compressibility and flow on color Doppler imaging. Profunda Femoral Vein: No evidence of thrombus. Normal compressibility and flow on color Doppler imaging. Femoral Vein: No evidence of thrombus. Normal compressibility, respiratory phasicity and response to augmentation. Popliteal Vein: No evidence of thrombus. Normal compressibility, respiratory phasicity and response to augmentation. Calf Veins: No evidence of thrombus. Normal compressibility and flow on color Doppler imaging. Superficial Great Saphenous Vein: No evidence of thrombus. Normal compressibility. Other Findings:  None. IMPRESSION: Partial compressibility of the left common femoral vein, worrisome for a nonocclusive thrombus. Electronically Signed   By: Rogelia Myers M.D.   On: 06/03/2024 16:20

## 2024-07-01 NOTE — Plan of Care (Signed)

## 2024-07-01 NOTE — Plan of Care (Signed)
 Low hemoglobin 6.9.  Patient is Jehovah witness which is restricting blood transfusion.  Hemodynamically stable.  Continue to monitor CBC  Evelyn Speaker, MD [facility] 07/01/2024, 4:28 AM

## 2024-07-01 NOTE — Progress Notes (Addendum)
 "  Rounding Note   Patient Name: Evelyn Baker Date of Encounter: 07/01/2024  [facility] Cardiologist: Madonna Large, DO   Subjective  No acute events overnight  Breathing well.  Still with a cough  Hgb down to 6.9  Scheduled Meds:  apixaban   5 mg Oral BID   azaTHIOprine   100 mg Oral Daily   carvedilol   6.25 mg Oral BID WC   colchicine  0.6 mg Oral BID   ferrous sulfate   325 mg Oral TID WC   fluticasone  furoate-vilanterol  1 puff Inhalation Daily   folic acid   1 mg Oral Daily   furosemide   20 mg Intravenous Q12H   isosorbide -hydrALAZINE   1 tablet Oral TID   levothyroxine   112 mcg Oral q AM   pantoprazole   40 mg Oral BID   senna  1 tablet Oral BID   Continuous Infusions:  PRN Meds: acetaminophen  **OR** [DISCONTINUED] acetaminophen , albuterol , guaiFENesin -dextromethorphan , hydrALAZINE , ondansetron  **OR** ondansetron  (ZOFRAN ) IV, senna-docusate   Vital Signs  Vitals:   06/30/24 1804 06/30/24 1940 06/30/24 2335 07/01/24 0308  BP: 129/89 131/84 (!) 144/73 (!) 144/81  Pulse: 98 98 94 94  Resp: 18 18 18 18   Temp: (!) 100.8 F (38.2 C) (!) 101.1 F (38.4 C) 100 F (37.8 C) 99.7 F (37.6 C)  TempSrc: Oral Oral Oral Oral  SpO2: 97% 100% 95% 95%  Weight:      Height:        Intake/Output Summary (Last 24 hours) at 07/01/2024 0832 Last data filed at 07/01/2024 0800 Gross per 24 hour  Intake 720 ml  Output 2300 ml  Net -1580 ml      06/30/2024    8:22 AM 06/30/2024    5:00 AM 06/29/2024    6:25 AM  Last 3 Weights  Weight (lbs) 193 lb 3.2 oz 192 lb 3.9 oz 193 lb 2 oz  Weight (kg) 87.635 kg 87.2 kg 87.6 kg      Telemetry Sinus- Personally Reviewed   Physical Exam  GEN: No acute distress.   Neck: Supple Cardiac: RRR Respiratory: Clear to auscultation bilaterally. GI: Soft, nontender, non-distended  MS: No edema Neuro:  Nonfocal  Psych: Normal affect   Labs  Recent Labs  Lab 06/27/24 1502 06/27/24 1714  TRNPT 43* 39*       Chemistry Recent  Labs  Lab 06/27/24 1502 06/29/24 0339 06/30/24 0422 07/01/24 0210  NA 132* 134* 135 132*  K 3.7 3.4* 3.4* 3.9  CL 92* 95* 96* 95*  CO2 28 28 30 29   GLUCOSE 96 120* 94 94  BUN 35* 44* 36* 31*  CREATININE 1.90* 1.60* 1.42* 1.56*  CALCIUM  8.7* 8.8* 8.9 8.2*  MG  --   --  1.9 1.7  PROT 7.1 6.6  --   --   ALBUMIN  2.8* 2.7*  --   --   AST 63* 74*  --   --   ALT 40 51*  --   --   ALKPHOS 84 73  --   --   BILITOT 0.7 0.3  --   --   GFRNONAA 30* 36* 42* 37*  ANIONGAP 12 12 9 8      Hematology Recent Labs  Lab 06/29/24 0339 06/30/24 0422 07/01/24 0210  WBC 10.8* 7.8 7.6  RBC 2.90* 2.96* 2.67*  HGB 7.5* 7.7* 6.9*  HCT 21.1* 21.6* 19.7*  MCV 72.8* 73.0* 73.8*  MCH 25.9* 26.0 25.8*  MCHC 35.5 35.6 35.0  RDW 19.4* 19.7* 19.5*  PLT 339  272 244    BNP Recent Labs  Lab 06/27/24 1506 06/29/24 0339  PROBNP 2,589.0* 1,724.0*      Radiology  ECHOCARDIOGRAM COMPLETE Result Date: 06/29/2024    ECHOCARDIOGRAM REPORT   Patient Name:   Evelyn Baker Date of Exam: 06/29/2024 Medical Rec #:  [id number]       Height:       65.0 in Accession #:    [id number]      Weight:       193.1 lb Date of Birth:  1960/11/13       BSA:          1.949 m Patient Age:    63 years        BP:           136/80 mmHg Patient Gender: F               HR:           80 bpm. Exam Location:  Inpatient Procedure: 2D Echo, Cardiac Doppler and Color Doppler (Both Spectral and Color            Flow Doppler were utilized during procedure). Indications:    CHF - Acute Diastolic  History:        Patient has prior history of Echocardiogram examinations, most                 recent 03/17/2023. CHF, CAD, Signs/Symptoms:Edema, Shortness of                 Breath and Dyspnea; Risk Factors:Hypertension and Dyslipidemia.  Sonographer:    Juliene Rucks Referring Phys: [id number] JEFFREY T [facility]  Sonographer Comments: Patient is obese. IMPRESSIONS  1. Large pericardial effusion; IVC not dilated and no RV diastolic collapse to suggest  tamponade.  2. Left ventricular ejection fraction, by estimation, is 60 to 65%. The left ventricle has normal function. The left ventricle has no regional wall motion abnormalities. There is mild left ventricular hypertrophy. Left ventricular diastolic parameters are consistent with Grade I diastolic dysfunction (impaired relaxation).  3. Right ventricular systolic function is normal. The right ventricular size is normal.  4. Large pericardial effusion. There is no evidence of cardiac tamponade.  5. The mitral valve is normal in structure. Trivial mitral valve regurgitation. No evidence of mitral stenosis.  6. The aortic valve is tricuspid. Aortic valve regurgitation is not visualized. No aortic stenosis is present.  7. The inferior vena cava is normal in size with greater than 50% respiratory variability, suggesting right atrial pressure of 3 mmHg. FINDINGS  Left Ventricle: Left ventricular ejection fraction, by estimation, is 60 to 65%. The left ventricle has normal function. The left ventricle has no regional wall motion abnormalities. The left ventricular internal cavity size was normal in size. There is  mild left ventricular hypertrophy. Left ventricular diastolic parameters are consistent with Grade I diastolic dysfunction (impaired relaxation). Right Ventricle: The right ventricular size is normal. Right ventricular systolic function is normal. Left Atrium: Left atrial size was normal in size. Right Atrium: Right atrial size was normal in size. Pericardium: A large pericardial effusion is present. There is no evidence of cardiac tamponade. Mitral Valve: The mitral valve is normal in structure. Trivial mitral valve regurgitation. No evidence of mitral valve stenosis. Tricuspid Valve: The tricuspid valve is normal in structure. Tricuspid valve regurgitation is trivial. No evidence of tricuspid stenosis. Aortic Valve: The aortic valve is tricuspid. Aortic valve regurgitation is not visualized. No aortic  stenosis  is present. Pulmonic Valve: The pulmonic valve was normal in structure. Pulmonic valve regurgitation is trivial. No evidence of pulmonic stenosis. Aorta: The aortic root is normal in size and structure. Venous: The inferior vena cava is normal in size with greater than 50% respiratory variability, suggesting right atrial pressure of 3 mmHg. IAS/Shunts: No atrial level shunt detected by color flow Doppler. Additional Comments: Large pericardial effusion; IVC not dilated and no RV diastolic collapse to suggest tamponade.  LEFT VENTRICLE PLAX 2D LVIDd:         4.30 cm      Diastology LVIDs:         2.70 cm      LV e' medial:    6.85 cm/s LV PW:         1.20 cm      LV E/e' medial:  16.1 LV IVS:        1.10 cm      LV e' lateral:   6.64 cm/s LVOT diam:     1.90 cm      LV E/e' lateral: 16.6 LV SV:         59 LV SV Index:   30 LVOT Area:     2.84 cm  LV Volumes (MOD) LV vol d, MOD A2C: 128.0 ml LV vol d, MOD A4C: 106.0 ml LV vol s, MOD A2C: 61.3 ml LV vol s, MOD A4C: 45.8 ml LV SV MOD A2C:     66.7 ml LV SV MOD A4C:     106.0 ml LV SV MOD BP:      64.9 ml RIGHT VENTRICLE             IVC RV Basal diam:  2.90 cm     IVC diam: 2.00 cm RV Mid diam:    2.20 cm RV S prime:     13.40 cm/s TAPSE (M-mode): 1.8 cm LEFT ATRIUM             Index        RIGHT ATRIUM           Index LA diam:        2.80 cm 1.44 cm/m   RA Area:     15.80 cm LA Vol (A2C):   47.6 ml 24.42 ml/m  RA Volume:   41.60 ml  21.34 ml/m LA Vol (A4C):   24.2 ml 12.42 ml/m LA Biplane Vol: 35.2 ml 18.06 ml/m  AORTIC VALVE LVOT Vmax:   107.00 cm/s LVOT Vmean:  72.100 cm/s LVOT VTI:    0.207 m  AORTA Ao Root diam: 2.90 cm Ao Asc diam:  2.80 cm MITRAL VALVE                TRICUSPID VALVE MV Area (PHT): 3.72 cm     TR Peak grad:   23.2 mmHg MV Decel Time: 204 msec     TR Vmax:        241.00 cm/s MV E velocity: 110.00 cm/s MV A velocity: 107.00 cm/s  SHUNTS MV E/A ratio:  1.03         Systemic VTI:  0.21 m                             Systemic Diam: 1.90  cm Redell Shallow MD Electronically signed by Redell Shallow MD Signature Date/Time: 06/29/2024/9:10:21 AM    Final     Patient Profile   64 y.o. female Jehovah's Witness,  history of CVA, hypertension, hyperlipidemia, mixed connective tissue disease, rheumatoid arthritis for evaluation of pericardial effusion.  Venous ultrasound June 03, 2024 worrisome for left common femoral vein nonocclusive thrombus.  Follow-up VQ scan not suggestive of pulmonary embolus.  Echocardiogram December 31 showed normal LV function, mild left ventricular hypertrophy, grade 1 diastolic dysfunction, large pericardial effusion but no evidence of tamponade.  Assessment & Plan   Pericardial effusion ESR and CRP elevated Started on colchicine 0.6 BID Holding ibuprofen  given anemia If anemia stabilizes, will consider Defer steroids given first time episode Limited TTE today to evaluate effusion Recent DVT U/S 06/03/24 Eliquis  5mg  BID Hypertension BP well controlled Continue coreg  6.25mg  BID Acute on chronic diastolic HF Mild LVH, G1DD, EF 60-65% 12/31 Cr elevated, will transition to lasix  20mg  PO BID from IV Continue coreg  6.25mg  BID Continue BiDil  20/37.5mg  BID Once Cr settles, will consider ARB/Entresto; Jardiance Euvolemic today AKI Cr up to 1.56 Transition to PO lasix  Anemia Unclear etiology No overt bleeding Evaluation/mgmt per hospital medicine  Discussed with patient and family extensively at bedside.   ADDENDUM 07/01/2024 11:00AM:  Called by Dr Zenaida.  Effusion larger with early echocardiographic signs of tamponade.  Patient normotensive and not tachycardic (HR about the same over the last few days).  Will d/c Eliquis  (start hep gtt for coverage of recent L femoral DVT >> above the popliteal DVT>>increased risk for emboization) and plan for pericardiocentesis Monday.  For questions or updates, please contact [facility] Please consult [email or web address] for contact info under        Signed, Baylea Milburn K Chao Blazejewski, MD  07/01/2024, 8:32 AM    "

## 2024-07-01 NOTE — Progress Notes (Signed)
 PHARMACY - ANTICOAGULATION CONSULT NOTE  Pharmacy Consult for heparin  Indication: DVT  Allergies[1]  Patient Measurements: Height: 5' 5 (165.1 cm) Weight: 87.6 kg (193 lb 3.2 oz) IBW/kg (Calculated) : 57 HEPARIN  DW (KG): 76.4  Vital Signs: Temp: 99.5 F (37.5 C) (01/02 1951) Temp Source: Oral (01/02 1951) BP: 139/79 (01/02 1951) Pulse Rate: 87 (01/02 1606)  Labs: Recent Labs    06/29/24 0339 06/30/24 0422 07/01/24 0210 07/01/24 1921  HGB 7.5* 7.7* 6.9*  --   HCT 21.1* 21.6* 19.7*  --   PLT 339 272 244  --   APTT  --   --   --  47*  CREATININE 1.60* 1.42* 1.56*  --     Estimated Creatinine Clearance: 41.4 mL/min (A) (by C-G formula based on SCr of 1.56 mg/dL (H)).   Medical History: Past Medical History:  Diagnosis Date   Asthma    Depression    Dyspnea    Hyperlipidemia    Hypertension    Hypothyroidism    Sciatic nerve disease    Thyroid  disease     Medications:   azaTHIOprine   100 mg Oral Daily   carvedilol   6.25 mg Oral BID WC   colchicine  0.6 mg Oral BID   Darbepoetin Alfa  100 mcg Subcutaneous Q7 days   ferrous sulfate   325 mg Oral TID WC   fluticasone  furoate-vilanterol  1 puff Inhalation Daily   folic acid   1 mg Oral Daily   furosemide   20 mg Oral BID   isosorbide -hydrALAZINE   1 tablet Oral TID   levothyroxine   112 mcg Oral q AM   pantoprazole   40 mg Oral BID   senna  1 tablet Oral BID     Assessment: Evelyn Baker is a 62 YOF on Eliquis  PTA for recent DVT in left femoral vein detected on 06/03/24. Eliquis  continued this admission. Patient also noted to have a pericardial effusion on TTE, noted to be larger on 07/01/24. Hgb also low to 6.9 mg/dL today, patient noted to be Jehovah witness which is restricting blood transfusion per MD. Plan to transition patient from Eliquis  to heparin  today. Pharmacy consulted for heparin  dosing. Last dose of Eliquis  administered on 06/30/24 at 2100.  07/01/24: Hgb down to 6.9 mg/dL today, patient noted to be Jehovah  witness which is restricting blood transfusion per MD. Due to recent Eliquis  administration, will monitor aPTT and anti-Xa levels until correlating.   PM update: aPTT 47 is subtherapeutic on 1100 units/hr. No issues with the infusion or bleeding reported per RN.  Goal of Therapy:  Heparin  level 0.3-0.7 units/ml Monitor platelets by anticoagulation protocol: Yes   Plan:  Increase heparin  infusion to 1250 units/hr  Monitor aPTT/HL in 8 hours Monitor anti-Xa level and aPTT daily until correlating Monitor CBC and s/sx of bleeding daily  Rocky Slade, PharmD, BCPS 07/01/2024 8:10 PM  Please check AMION for all [facility] phone numbers After 10:00 PM, call Main Pharmacy [phone]      [1]  Allergies Allergen Reactions   Shellfish Allergy Anaphylaxis and Swelling   Avocado Swelling    Lips   Codeine  Other (See Comments)    Constipation   Iodinated Contrast Media Other (See Comments)   Mushroom Other (See Comments)    Itchy throat   Pineapple Swelling    Lip swelling    Penicillins Rash    Reaction: Childhood

## 2024-07-02 DIAGNOSIS — I5033 Acute on chronic diastolic (congestive) heart failure: Secondary | ICD-10-CM

## 2024-07-02 DIAGNOSIS — D631 Anemia in chronic kidney disease: Secondary | ICD-10-CM | POA: Diagnosis not present

## 2024-07-02 DIAGNOSIS — I509 Heart failure, unspecified: Secondary | ICD-10-CM | POA: Diagnosis not present

## 2024-07-02 DIAGNOSIS — I82412 Acute embolism and thrombosis of left femoral vein: Secondary | ICD-10-CM | POA: Diagnosis not present

## 2024-07-02 DIAGNOSIS — N189 Chronic kidney disease, unspecified: Secondary | ICD-10-CM | POA: Diagnosis not present

## 2024-07-02 LAB — CBC
HCT: 19.5 % — ABNORMAL LOW (ref 36.0–46.0)
Hemoglobin: 6.8 g/dL — CL (ref 12.0–15.0)
MCH: 26.1 pg (ref 26.0–34.0)
MCHC: 34.9 g/dL (ref 30.0–36.0)
MCV: 74.7 fL — ABNORMAL LOW (ref 80.0–100.0)
Platelets: 258 K/uL (ref 150–400)
RBC: 2.61 MIL/uL — ABNORMAL LOW (ref 3.87–5.11)
RDW: 20.1 % — ABNORMAL HIGH (ref 11.5–15.5)
WBC: 5.9 K/uL (ref 4.0–10.5)
nRBC: 0 % (ref 0.0–0.2)

## 2024-07-02 LAB — APTT
aPTT: 61 s — ABNORMAL HIGH (ref 24–36)
aPTT: 69 s — ABNORMAL HIGH (ref 24–36)

## 2024-07-02 LAB — CULTURE, BLOOD (ROUTINE X 2): Culture: NO GROWTH

## 2024-07-02 LAB — HEPARIN LEVEL (UNFRACTIONATED): Heparin Unfractionated: >1.1 [IU]/mL — ABNORMAL HIGH (ref 0.30–0.70)

## 2024-07-02 MED ORDER — K PHOS MONO-SOD PHOS DI & MONO 155-852-130 MG PO TABS
250.0000 mg | ORAL_TABLET | Freq: Two times a day (BID) | ORAL | Status: AC
  Administered 2024-07-02 – 2024-07-04 (×6): 250 mg via ORAL
  Filled 2024-07-02 (×6): qty 1

## 2024-07-02 NOTE — Progress Notes (Signed)
 "  Rounding Note   Patient Name: Evelyn Baker Date of Encounter: 07/02/2024  [facility] Cardiologist: Madonna Large, DO   Subjective - No acute events overnight -No complaints today  Scheduled Meds:  azaTHIOprine   100 mg Oral Daily   carvedilol   6.25 mg Oral BID WC   colchicine   0.6 mg Oral BID   Darbepoetin Alfa   100 mcg Subcutaneous Q7 days   ferrous sulfate   325 mg Oral TID WC   fluticasone  furoate-vilanterol  1 puff Inhalation Daily   folic acid   1 mg Oral Daily   furosemide   20 mg Oral BID   isosorbide -hydrALAZINE   1 tablet Oral TID   levothyroxine   112 mcg Oral q AM   pantoprazole   40 mg Oral BID   phosphorus  250 mg Oral BID   senna  1 tablet Oral BID   Continuous Infusions:  heparin  1,400 Units/hr (07/02/24 1058)   PRN Meds: acetaminophen  **OR** [DISCONTINUED] acetaminophen , albuterol , guaiFENesin -dextromethorphan , hydrALAZINE , ondansetron  **OR** ondansetron  (ZOFRAN ) IV, senna-docusate   Vital Signs  Vitals:   07/01/24 1951 07/02/24 0304 07/02/24 0818 07/02/24 1103  BP: 139/79 (!) 141/81 (!) 143/76 136/82  Pulse:   94 97  Resp: 18 18 18 20   Temp: 99.5 F (37.5 C) 99.5 F (37.5 C) 99.8 F (37.7 C)   TempSrc: Oral Oral Oral   SpO2: 97% 97% 97%   Weight:  88.4 kg    Height:        Intake/Output Summary (Last 24 hours) at 07/02/2024 1154 Last data filed at 07/02/2024 1100 Gross per 24 hour  Intake 833.56 ml  Output 300 ml  Net 533.56 ml      07/02/2024    3:04 AM 06/30/2024    8:22 AM 06/30/2024    5:00 AM  Last 3 Weights  Weight (lbs) 194 lb 14.2 oz 193 lb 3.2 oz 192 lb 3.9 oz  Weight (kg) 88.4 kg 87.635 kg 87.2 kg      Telemetry NSR- Personally Reviewed  ECG  No new ECG  Physical Exam  GEN: No acute distress.   Neck: No JVD Cardiac: RRR, no murmurs, rubs, or gallops.  Respiratory: Clear to auscultation bilaterally. GI: Soft, nontender, non-distended  MS: Trace bilateral pitting edema; some skin discoloration over the left anterior  shin Neuro:  Nonfocal  Psych: Normal affect   Labs High Sensitivity Troponin:  No results for input(s): TROPONINIHS in the last 720 hours.  Recent Labs  Lab 06/27/24 1502 06/27/24 1714  TRNPT 43* 39*       Chemistry Recent Labs  Lab 06/27/24 1502 06/29/24 0339 06/30/24 0422 07/01/24 0210  NA 132* 134* 135 132*  K 3.7 3.4* 3.4* 3.9  CL 92* 95* 96* 95*  CO2 28 28 30 29   GLUCOSE 96 120* 94 94  BUN 35* 44* 36* 31*  CREATININE 1.90* 1.60* 1.42* 1.56*  CALCIUM  8.7* 8.8* 8.9 8.2*  MG  --   --  1.9 1.7  PROT 7.1 6.6  --   --   ALBUMIN  2.8* 2.7*  --   --   AST 63* 74*  --   --   ALT 40 51*  --   --   ALKPHOS 84 73  --   --   BILITOT 0.7 0.3  --   --   GFRNONAA 30* 36* 42* 37*  ANIONGAP 12 12 9 8     Lipids No results for input(s): CHOL, TRIG, HDL, LABVLDL, LDLCALC, CHOLHDL in the last 168 hours.  Hematology Recent Labs  Lab 06/30/24 0422 07/01/24 0210 07/01/24 1921 07/02/24 0847  WBC 7.8 7.6  --  5.9  RBC 2.96* 2.67* 2.60* 2.61*  HGB 7.7* 6.9*  --  6.8*  HCT 21.6* 19.7*  --  19.5*  MCV 73.0* 73.8*  --  74.7*  MCH 26.0 25.8*  --  26.1  MCHC 35.6 35.0  --  34.9  RDW 19.7* 19.5*  --  20.1*  PLT 272 244  --  258   Thyroid  No results for input(s): TSH, FREET4 in the last 168 hours.  BNP Recent Labs  Lab 06/27/24 1506 06/29/24 0339  PROBNP 2,589.0* 1,724.0*    DDimer No results for input(s): DDIMER in the last 168 hours.   Radiology  ECHOCARDIOGRAM LIMITED Result Date: 07/01/2024    ECHOCARDIOGRAM LIMITED REPORT   Patient Name:   Evelyn Baker Date of Exam: 07/01/2024 Medical Rec #:  [id number]       Height:       65.0 in Accession #:    [id number]      Weight:       193.2 lb Date of Birth:  Jul 17, 1960       BSA:          1.949 m Patient Age:    63 years        BP:           129/78 mmHg Patient Gender: F               HR:           87 bpm. Exam Location:  Inpatient Procedure: Limited Echo, Cardiac Doppler and Color Doppler (Both Spectral and             Color Flow Doppler were utilized during procedure). Indications:    Pericardial effusion I31.3  History:        Patient has prior history of Echocardiogram examinations, most                 recent 06/29/2024. Risk Factors:Hypertension.  Sonographer:    Jayson Gaskins Referring Phys: [id number] ARUN K THUKKANI IMPRESSIONS  1. Left ventricular ejection fraction, by estimation, is 60 to 65%. The left ventricle has normal function. The left ventricle has no regional wall motion abnormalities.  2. Right ventricular systolic function is normal. The right ventricular size is normal.  3. No evidence of RV diastolic collapse but there is clinically significant respiratory variation across the mitral and tricuspid valves which has worsened since prevoius echo. Additionally effusion is now larger. Discussed with interventional and exam and echo not consistent with overt cardiac tamponade. Would repeat echo in the next 48-72 hours or clinical change. Large pericardial effusion.  4. The mitral valve is normal in structure. No evidence of mitral valve regurgitation. FINDINGS  Left Ventricle: Left ventricular ejection fraction, by estimation, is 60 to 65%. The left ventricle has normal function. The left ventricle has no regional wall motion abnormalities. The left ventricular internal cavity size was normal in size. There is  no left ventricular hypertrophy. Right Ventricle: The right ventricular size is normal. Right vetricular wall thickness was not well visualized. Right ventricular systolic function is normal. Pericardium: No evidence of RV diastolic collapse but there is clinically significant respiratory variation across the mitral and tricuspid valves which has worsened since prevoius echo. Additionally effusion is now larger. Discussed with interventional and exam and echo not consistent with overt cardiac tamponade. Would repeat echo in the next 48-72 hours  or clinical change. A large pericardial effusion is present.  Mitral Valve: The mitral valve is normal in structure. Tricuspid Valve: The tricuspid valve is normal in structure. Tricuspid valve regurgitation is not demonstrated. LEFT VENTRICLE PLAX 2D LVIDd:         4.10 cm LVIDs:         2.90 cm LV PW:         1.30 cm LV IVS:        1.00 cm LVOT diam:     1.85 cm LVOT Area:     2.69 cm  LEFT ATRIUM             Index LA Vol (A2C):   35.8 ml 18.37 ml/m LA Vol (A4C):   36.8 ml 18.88 ml/m LA Biplane Vol: 36.6 ml 18.78 ml/m   SHUNTS Systemic Diam: 1.85 cm Morene Brownie Electronically signed by Morene Brownie Signature Date/Time: 07/01/2024/11:03:15 AM    Final       Patient Profile   64 y.o. female Jehovah's Witness, history of CVA, hypertension, hyperlipidemia, mixed connective tissue disease, rheumatoid arthritis for evaluation of pericardial effusion.  Venous ultrasound June 03, 2024 worrisome for left common femoral vein nonocclusive thrombus.  Follow-up VQ scan not suggestive of pulmonary embolus.  Echocardiogram December 31 showed normal LV function, mild left ventricular hypertrophy, grade 1 diastolic dysfunction, large pericardial effusion but no evidence of tamponade.   Assessment & Plan   #Pericardial Effusion #Possible Pericarditis - Admitted with signs and symptoms of volume overload and underwent echocardiogram demonstrating a moderate pericardial effusion. - The patient had some SOB and swelling, but was not terribly symptomatic. - No evidence of tamponade by echocardiogram; however, serial echoes demonstrated slight increase in the size of the effusion as well as some respiratory flow variation thus prompting definitive treatment with pericardiocentesis. -Inflammatory markers were elevated suggesting possible pericarditis as an etiology for the effusion, but it is unclear at this time. - Planning for pericardiocentesis on 07/04/2024. Tentative plan for pericardiocentesis on Monday, 07/04/2024 Continue colchicine  0.6 mg twice daily for presumed  pericarditis.  Not receiving NSAIDs due to profound anemia. Could consider cardiac MRI following pericardiocentesis to confirm the diagnosis of pericarditis since it is unclear at this time.  #Acute on Chronic HFpEF - Found to be volume overloaded on admission and is s/p IV diuresis. - Currently being managed by p.o. Lasix . - Volume status looks pretty good apart from some mild peripheral edema. - No further changes at this time. Continue Lasix  20 mg p.o. twice daily Can consider SGLT2 or MRA if renal function improves  #HTN - Has had historically challenging blood pressures to control, but she is currently normotensive. - Continue her current regimen. Continue carvedilol  6.25 mg twice daily Continue BiDil  20/37.5 mg twice daily  #Recent DVT - Was originally on Eliquis  and then transition to a heparin  drip pending pericardiocentesis. - Once safe from a procedural and anemia standpoint can transition back to Eliquis . Continue heparin  drip pending pericardiocentesis  #Severe Anemia - Hemoglobin has been <7 with hematology following thinking that this is anemia of CKD and IDA. - The patient is a Jehovah's Witness so cannot accept blood transfusion.  No overt bleeding. - Will hold off on initiating NSAIDs until hemoglobin improves.  #AKI on CKD Management per primary      For questions or updates, please contact [facility] Please consult [email or web address] for contact info under       Signed, Georganna Archer, MD  07/02/2024, 11:54 AM    "

## 2024-07-02 NOTE — Progress Notes (Signed)
 DENIM START   DOB:08-04-1960   FM#:[id number]    ASSESSMENT & PLAN:   Anemia due to chronic kidney disease History of iron  deficiency anemia - Hemoglobin low 6. today. - Patient is Jehovah witness and does not accept blood products. - Continue folic acid  supplementation. First dose darbepoetin injection was given on January 2, will continue to prescribe weekly Repeat iron  studies show high iron  level, so she would not benefit from further iron   Pericardial effusion Pulmonary edema Suspect CHF, newly diagnosed - Echocardiogram shows increasing size of pericardial effusion - Eliquis  was held and patient now receiving IV heparin . - Cardio following and there is a plan for pericardiocentesis   History of DVT, LLE - Patient was discharged home on Eliquis  and reports that she has been compliant with medications.  States that swelling has gone down and she no longer has any pain in lower extremities. - Continue wearing compression stockings - On IV heparin , monitor per protocol  History of CKD - Avoid nephrotoxic agents - Patient was instructed during heme outpatient visit 12/22 to hold valsartan  and chlorothiazide until after nephrology follow-up in January. - Follow-up closely with nephrology  Plan of care is discussed with the patient and her husband I do not expect we will see significant bump of her hemoglobin with darbepoetin injection yesterday last case 3 to 4 days minimum Dr. Autumn will resume care next week   Almarie Bedford, MD 07/02/2024 1:32 PM  Subjective:  She is feeling fine.  She denies chest pain or shortness of breath.  Received first dose of darbepoetin injection yesterday  Objective:  Vitals:   07/02/24 0818 07/02/24 1103  BP: (!) 143/76 136/82  Pulse: 94 97  Resp: 18 20  Temp: 99.8 F (37.7 C)   SpO2: 97%      Intake/Output Summary (Last 24 hours) at 07/02/2024 1332 Last data filed at 07/02/2024 1100 Gross per 24 hour  Intake 713.56 ml  Output 300 ml  Net  413.56 ml

## 2024-07-02 NOTE — Progress Notes (Signed)
 PHARMACY - ANTICOAGULATION CONSULT NOTE  Pharmacy Consult for heparin  Indication: DVT  Allergies[1]  Patient Measurements: Height: 5' 5 (165.1 cm) Weight: 88.4 kg (194 lb 14.2 oz) IBW/kg (Calculated) : 57 HEPARIN  DW (KG): 76.4  Vital Signs: Temp: 99.3 F (37.4 C) (01/03 1655) Temp Source: Oral (01/03 1655) BP: 124/79 (01/03 1655) Pulse Rate: 96 (01/03 1655)  Labs: Recent Labs    06/30/24 0422 07/01/24 0210 07/01/24 1921 07/02/24 0847 07/02/24 1858  HGB 7.7* 6.9*  --  6.8*  --   HCT 21.6* 19.7*  --  19.5*  --   PLT 272 244  --  258  --   APTT  --   --  47* 61* 69*  HEPARINUNFRC  --   --   --  >1.10*  --   CREATININE 1.42* 1.56*  --   --   --     Estimated Creatinine Clearance: 41.6 mL/min (A) (by C-G formula based on SCr of 1.56 mg/dL (H)).   Medical History: Past Medical History:  Diagnosis Date   Asthma    Depression    Dyspnea    Hyperlipidemia    Hypertension    Hypothyroidism    Sciatic nerve disease    Thyroid  disease     Medications:   azaTHIOprine   100 mg Oral Daily   carvedilol   6.25 mg Oral BID WC   colchicine   0.6 mg Oral BID   Darbepoetin Alfa   100 mcg Subcutaneous Q7 days   ferrous sulfate   325 mg Oral TID WC   fluticasone  furoate-vilanterol  1 puff Inhalation Daily   folic acid   1 mg Oral Daily   furosemide   20 mg Oral BID   isosorbide -hydrALAZINE   1 tablet Oral TID   levothyroxine   112 mcg Oral q AM   pantoprazole   40 mg Oral BID   phosphorus  250 mg Oral BID   senna  1 tablet Oral BID     Assessment: Evelyn Baker is a 89 YOF on Eliquis  PTA for recent DVT in left femoral vein detected on 06/03/24. Eliquis  continued this admission. Patient also noted to have a pericardial effusion on TTE, noted to be larger on 07/01/24. Hgb also low to 6.9 mg/dL today, patient noted to be Jehovah witness which is restricting blood transfusion per MD. Plan to transition patient from Eliquis  to heparin  today. Pharmacy consulted for heparin  dosing. Last dose  of Eliquis  administered on 06/30/24 at 2100.  07/03/2023: HL still supratherapeutic > 1.1, aPTT subtherapeutic at 61 on heparin  1250 units/hr. Per RN, no pauses in line or bleeding reported. Hgb at 6.8, otherwise CBC stable.   PM update: aPTT 69 is therapeutic on 1400 units/hr. No issues with the infusion or bleeding reported.  Goal of Therapy:  Heparin  level 0.3-0.7 units/ml aPTT 66-102 Monitor platelets by anticoagulation protocol: Yes   Plan:  Continue heparin  infusion at 1400 units/hr  Check confirmatory aPTT/HL in 8 hours Monitor anti-Xa level and aPTT daily until correlating Monitor CBC and s/sx of bleeding daily  Rocky Slade, PharmD, BCPS Please refer to [facility] for [facility] Pharmacy numbers 07/02/2024 7:38 PM        [1]  Allergies Allergen Reactions   Shellfish Allergy Anaphylaxis and Swelling   Avocado Swelling    Lips   Codeine  Other (See Comments)    Constipation   Iodinated Contrast Media Other (See Comments)   Mushroom Other (See Comments)    Itchy throat   Pineapple Swelling    Lip swelling  Penicillins Rash    Reaction: Childhood

## 2024-07-02 NOTE — Progress Notes (Addendum)
 PHARMACY - ANTICOAGULATION CONSULT NOTE  Pharmacy Consult for heparin  Indication: DVT  Allergies[1]  Patient Measurements: Height: 5' 5 (165.1 cm) Weight: 88.4 kg (194 lb 14.2 oz) IBW/kg (Calculated) : 57 HEPARIN  DW (KG): 76.4  Vital Signs: Temp: 99.8 F (37.7 C) (01/03 0818) Temp Source: Oral (01/03 0818) BP: 143/76 (01/03 0818) Pulse Rate: 94 (01/03 0818)  Labs: Recent Labs    06/30/24 0422 07/01/24 0210 07/01/24 1921 07/02/24 0847  HGB 7.7* 6.9*  --  6.8*  HCT 21.6* 19.7*  --  19.5*  PLT 272 244  --  258  APTT  --   --  47* 61*  HEPARINUNFRC  --   --   --  >1.10*  CREATININE 1.42* 1.56*  --   --     Estimated Creatinine Clearance: 41.6 mL/min (A) (by C-G formula based on SCr of 1.56 mg/dL (H)).   Medical History: Past Medical History:  Diagnosis Date   Asthma    Depression    Dyspnea    Hyperlipidemia    Hypertension    Hypothyroidism    Sciatic nerve disease    Thyroid  disease     Medications:   azaTHIOprine   100 mg Oral Daily   carvedilol   6.25 mg Oral BID WC   colchicine   0.6 mg Oral BID   Darbepoetin Alfa   100 mcg Subcutaneous Q7 days   ferrous sulfate   325 mg Oral TID WC   fluticasone  furoate-vilanterol  1 puff Inhalation Daily   folic acid   1 mg Oral Daily   furosemide   20 mg Oral BID   isosorbide -hydrALAZINE   1 tablet Oral TID   levothyroxine   112 mcg Oral q AM   pantoprazole   40 mg Oral BID   phosphorus  250 mg Oral BID   senna  1 tablet Oral BID     Assessment: [name] is a 75 YOF on Eliquis  PTA for recent DVT in left femoral vein detected on 06/03/24. Eliquis  continued this admission. Patient also noted to have a pericardial effusion on TTE, noted to be larger on 07/01/24. Hgb also low to 6.9 mg/dL today, patient noted to be Jehovah witness which is restricting blood transfusion per MD. Plan to transition patient from Eliquis  to heparin  today. Pharmacy consulted for heparin  dosing. Last dose of Eliquis  administered on 06/30/24 at  2100.  07/03/2023: HL still supratherapeutic > 1.1, aPTT subtherapeutic at 61 on heparin  1250 units/hr. Per RN, no pauses in line or bleeding reported. Hgb at 6.8, otherwise CBC stable.   Goal of Therapy:  Heparin  level 0.3-0.7 units/ml aPTT 66-102 Monitor platelets by anticoagulation protocol: Yes   Plan:  Increase heparin  infusion to 1400 units/hr  Monitor aPTT/HL in 8 hours Monitor anti-Xa level and aPTT daily until correlating Monitor CBC and s/sx of bleeding daily  Evelyn Baker, PharmD PGY-1 Acute Care Pharmacy Resident [facility] Please refer to [facility] for [facility] Pharmacy numbers 07/02/2024 10:18 AM       [1]  Allergies Allergen Reactions   Shellfish Allergy Anaphylaxis and Swelling   Avocado Swelling    Lips   Codeine  Other (See Comments)    Constipation   Iodinated Contrast Media Other (See Comments)   Mushroom Other (See Comments)    Itchy throat   Pineapple Swelling    Lip swelling    Penicillins Rash    Reaction: Childhood

## 2024-07-02 NOTE — Progress Notes (Signed)
 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 Hematology saw her during that admission, and she was dosed with IV Fe and epo.  She returned to [facility] 06/27/24 reporting a persistent cough since her discharge with gradually increasing exertional shortness of breath and severe orthopnea.  Patient was admitted for acute pulmonary edema started on IV Lasix .  Assessment & Plan:   Principal Problem:   Congestive heart failure (HCC) Active Problems:   Pulmonary edema cardiac cause (HCC)   Pericardial effusion   Anemia due to chronic kidney disease   Acute / Subacute pulmonary edema of unclear etiology: Pericardial Effusion : Suspected newly diagnosed CHF. Continue to diurese with Lasix  20 mg IV q 12hr. Monitor daily weight, intake output charting. Monitor hemoglobin trend, follow BMP trend. Echocardiogram shows LVEF 60 to 65%, large pericardial effusion but no tamponade signs. Patient is hemodynamically stable,  no signs of tamponade. Cardiology is consulted.  Patient did have bronchitis earlier this month,  also has mixed connective tissue disease.   CRP and ESR elevated.  Cardiology recommended colchicine  0.6 mg twice daily,  would like to avoid NSAIDs due to Eliquis . Repeat echocardiogram showed increasing size of pericardial effusion with early signs of tamponade. Cardiology tentatively scheduled patient for pericardiocentesis on Monday,  pending improvement in hemoglobin.   Left femoral vein DVT: No evidence of PE during recent admission. Eliquis  is held and patient started on IV heparin . Patient has noted significant  decrease in the swelling of her LLE while on anticoaguation.   AKI on CKD IIIa: Creatinine has fluctuated while being on diuresis.  Continue to monitor serum creatinine, avoid nephrotoxic medications.   Hyponatremia: Possibly due to volume overload - Follow trend w/ diuresis  Improved.  HTN: BP controlled at present - follow w/ diuresis.   Hyperlipidemia: Continue home medications.   Hypothyroidism: Continue levothyroxine .   MCTD: On chronic azathioprine .  Hypochromic normocytic anemia: Continue iron  supplementation. Hemoglobin dropped to 6.9.  Patient strictly refuses to have blood transfusion. Hematology consulted for recommendations given patient is tentatively scheduled for pericardiocentesis, pending improvement in hemoglobin.  DVT prophylaxis: Eliquis  Code Status:Full code Family Communication: Husband at bed side Disposition Plan:     Status is: Inpatient Remains inpatient appropriate because: Patient admitted for CHF exacerbation,  also have anemia.  Found to have large pericardial effusion,  cardiology is consulted.  Repeat echo shows increasing size of pericardial effusion with early signs of tamponade.  Cardiology tentatively scheduled patient on Monday for pericardiocentesis.  Patient is not medically ready for discharge.    Consultants:   Cardiology Hematology  Procedures: Echocardiogram.  Antimicrobials: Anti-infectives (From admission, onward)    None      Subjective: Patient was seen and examined at bedside.  Overnight events noted. Patient was lying comfortably in bed ,  denies any chest pain,  shortness of breath.   She is hemodynamically stable.  She refuses blood transfusion.  Objective: Vitals:   07/01/24 1951 07/02/24 0304 07/02/24 0818 07/02/24 1103  BP: 139/79 (!) 141/81 (!) 143/76 136/82  Pulse:   94 97  Resp: 18 18 18 20   Temp: 99.5 F (37.5 C) 99.5 F (37.5  C) 99.8 F (37.7 C)   TempSrc: Oral Oral Oral   SpO2: 97% 97% 97%    Weight:  88.4 kg    Height:        Intake/Output Summary (Last 24 hours) at 07/02/2024 1337 Last data filed at 07/02/2024 1100 Gross per 24 hour  Intake 713.56 ml  Output 300 ml  Net 413.56 ml   Filed Weights   06/30/24 0500 06/30/24 0822 07/02/24 0304  Weight: 87.2 kg 87.6 kg 88.4 kg    Examination:  General exam: Appears calm and comfortable, not in any acute distress. Respiratory system: CTA Bilaterally. Respiratory effort normal. RR 14 Cardiovascular system: S1 & S2 heard, RRR. No JVD, murmurs, rubs, gallops or clicks. No pedal edema. Gastrointestinal system: Abdomen is non distended, soft and non tender.  Normal bowel sounds heard. Central nervous system: Alert and oriented x 3. No focal neurological deficits. Extremities: Edema+, no cyanosis, no clubbing. Skin: No rashes, lesions or ulcers Psychiatry: Judgement and insight appear normal. Mood & affect appropriate.   Data Reviewed: I have personally reviewed following labs and imaging studies  CBC: Recent Labs  Lab 06/27/24 1501 06/29/24 0339 06/30/24 0422 07/01/24 0210 07/02/24 0847  WBC 7.7 10.8* 7.8 7.6 5.9  HGB 7.4* 7.5* 7.7* 6.9* 6.8*  HCT 21.0* 21.1* 21.6* 19.7* 19.5*  MCV 73.9* 72.8* 73.0* 73.8* 74.7*  PLT 262 339 272 244 258   Basic Metabolic Panel: Recent Labs  Lab 06/27/24 1502 06/29/24 0339 06/30/24 0422 07/01/24 0210  NA 132* 134* 135 132*  K 3.7 3.4* 3.4* 3.9  CL 92* 95* 96* 95*  CO2 28 28 30 29   GLUCOSE 96 120* 94 94  BUN 35* 44* 36* 31*  CREATININE 1.90* 1.60* 1.42* 1.56*  CALCIUM  8.7* 8.8* 8.9 8.2*  MG  --   --  1.9 1.7  PHOS  --   --  2.5 2.1*   GFR: Estimated Creatinine Clearance: 41.6 mL/min (A) (by C-G formula based on SCr of 1.56 mg/dL (H)). Liver Function Tests: Recent Labs  Lab 06/27/24 1502 06/29/24 0339  AST 63* 74*  ALT 40 51*  ALKPHOS 84 73  BILITOT 0.7 0.3  PROT 7.1 6.6  ALBUMIN  2.8* 2.7*   No results for input(s): LIPASE, AMYLASE in the last 168 hours. No  results for input(s): AMMONIA in the last 168 hours. Coagulation Profile: No results for input(s): INR, PROTIME in the last 168 hours. Cardiac Enzymes: No results for input(s): CKTOTAL, CKMB, CKMBINDEX, TROPONINI in the last 168 hours. BNP (last 3 results) Recent Labs    06/27/24 1506 06/29/24 0339  PROBNP 2,589.0* 1,724.0*   HbA1C: No results for input(s): HGBA1C in the last 72 hours. CBG: No results for input(s): GLUCAP in the last 168 hours. Lipid Profile: No results for input(s): CHOL, HDL, LDLCALC, TRIG, CHOLHDL, LDLDIRECT in the last 72 hours. Thyroid  Function Tests: No results for input(s): TSH, T4TOTAL, FREET4, T3FREE, THYROIDAB in the last 72 hours. Anemia Panel: Recent Labs    07/01/24 1921  FERRITIN 1,373*  TIBC 169*  IRON  24*  RETICCTPCT 2.2   Sepsis Labs: Recent Labs  Lab 06/27/24 1506  LATICACIDVEN 0.8    Recent Results (from the past 240 hours)  Blood culture (routine x 2)     Status: None   Collection Time: 06/27/24  3:06 PM   Specimen: BLOOD  Result Value Ref Range Status   Specimen Description   Final    BLOOD LEFT ANTECUBITAL Performed at [facility], [phone]  [address] [place], [place], [place] [address]    Special Requests   Final    Blood Culture results may not be optimal due to an inadequate volume of blood received in culture bottles BOTTLES DRAWN AEROBIC AND ANAEROBIC Performed at [facility], [address], [place], [place] [address]    Culture   Final    NO GROWTH 5 DAYS Performed at [facility], [address], [place], [place] [address]    Report Status 07/02/2024 FINAL  Final    Radiology Studies: ECHOCARDIOGRAM LIMITED Result Date: 07/01/2024    ECHOCARDIOGRAM LIMITED REPORT   Patient Name:   Evelyn Baker Date of Exam: 07/01/2024 Medical Rec #:  [id number]       Height:       65.0 in Accession #:    [id number]      Weight:       193.2 lb Date of Birth:   1960/12/23       BSA:          1.949 m Patient Age:    63 years        BP:           129/78 mmHg Patient Gender: F               HR:           87 bpm. Exam Location:  Inpatient Procedure: Limited Echo, Cardiac Doppler and Color Doppler (Both Spectral and            Color Flow Doppler were utilized during procedure). Indications:    Pericardial effusion I31.3  History:        Patient has prior history of Echocardiogram examinations, most                 recent 06/29/2024. Risk Factors:Hypertension.  Sonographer:    Jayson Gaskins Referring Phys: [id number] ARUN K THUKKANI IMPRESSIONS  1. Left ventricular ejection fraction, by estimation, is 60 to 65%. The left ventricle has normal function. The left ventricle has no regional wall motion abnormalities.  2. Right ventricular systolic function is normal. The right ventricular size is normal.  3. No evidence of RV diastolic collapse but there is clinically significant respiratory variation across the mitral and tricuspid valves which has worsened since prevoius echo. Additionally effusion is now larger. Discussed with interventional and exam and echo not consistent with overt cardiac tamponade. Would repeat echo in the next 48-72 hours or clinical change. Large pericardial effusion.  4. The mitral valve is normal in structure. No evidence of mitral valve regurgitation. FINDINGS  Left Ventricle: Left ventricular ejection fraction, by estimation, is 60 to 65%. The left ventricle has normal function. The left ventricle has no regional wall motion abnormalities. The left ventricular internal cavity size was normal in size. There is  no left ventricular hypertrophy. Right Ventricle: The right ventricular size is normal. Right vetricular wall thickness was not well visualized. Right ventricular systolic function is normal. Pericardium: No evidence of RV diastolic collapse but there is clinically significant respiratory variation across the mitral and tricuspid valves which has  worsened since prevoius echo. Additionally effusion is now larger. Discussed with interventional and exam and echo not consistent with overt cardiac tamponade. Would repeat echo in the next 48-72 hours or clinical change. A large pericardial effusion is present. Mitral Valve: The mitral valve is normal in structure. Tricuspid Valve: The tricuspid valve is normal in structure. Tricuspid valve regurgitation is not demonstrated. LEFT VENTRICLE PLAX  2D LVIDd:         4.10 cm LVIDs:         2.90 cm LV PW:         1.30 cm LV IVS:        1.00 cm LVOT diam:     1.85 cm LVOT Area:     2.69 cm  LEFT ATRIUM             Index LA Vol (A2C):   35.8 ml 18.37 ml/m LA Vol (A4C):   36.8 ml 18.88 ml/m LA Biplane Vol: 36.6 ml 18.78 ml/m   SHUNTS Systemic Diam: 1.85 cm Morene Brownie Electronically signed by Morene Brownie Signature Date/Time: 07/01/2024/11:03:15 AM    Final    Scheduled Meds:  azaTHIOprine   100 mg Oral Daily   carvedilol   6.25 mg Oral BID WC   colchicine   0.6 mg Oral BID   Darbepoetin Alfa   100 mcg Subcutaneous Q7 days   ferrous sulfate   325 mg Oral TID WC   fluticasone  furoate-vilanterol  1 puff Inhalation Daily   folic acid   1 mg Oral Daily   furosemide   20 mg Oral BID   isosorbide -hydrALAZINE   1 tablet Oral TID   levothyroxine   112 mcg Oral q AM   pantoprazole   40 mg Oral BID   phosphorus  250 mg Oral BID   senna  1 tablet Oral BID   Continuous Infusions:  heparin  1,400 Units/hr (07/02/24 1236)     LOS: 4 days    Time spent: 35 mins    Darcel Dawley, MD [facility]   If 7PM-7AM, please contact night-coverage  "

## 2024-07-03 DIAGNOSIS — N189 Chronic kidney disease, unspecified: Secondary | ICD-10-CM | POA: Diagnosis not present

## 2024-07-03 DIAGNOSIS — I509 Heart failure, unspecified: Secondary | ICD-10-CM | POA: Diagnosis not present

## 2024-07-03 DIAGNOSIS — D631 Anemia in chronic kidney disease: Secondary | ICD-10-CM | POA: Diagnosis not present

## 2024-07-03 LAB — APTT: aPTT: 89 s — ABNORMAL HIGH (ref 24–36)

## 2024-07-03 LAB — CBC
HCT: 18 % — ABNORMAL LOW (ref 36.0–46.0)
Hemoglobin: 6.3 g/dL — CL (ref 12.0–15.0)
MCH: 25.6 pg — ABNORMAL LOW (ref 26.0–34.0)
MCHC: 35 g/dL (ref 30.0–36.0)
MCV: 73.2 fL — ABNORMAL LOW (ref 80.0–100.0)
Platelets: 252 K/uL (ref 150–400)
RBC: 2.46 MIL/uL — ABNORMAL LOW (ref 3.87–5.11)
RDW: 19.7 % — ABNORMAL HIGH (ref 11.5–15.5)
WBC: 6.4 K/uL (ref 4.0–10.5)
nRBC: 0 % (ref 0.0–0.2)

## 2024-07-03 LAB — HEPARIN LEVEL (UNFRACTIONATED): Heparin Unfractionated: 0.81 [IU]/mL — ABNORMAL HIGH (ref 0.30–0.70)

## 2024-07-03 LAB — ERYTHROPOIETIN: Erythropoietin: 27 m[IU]/mL — ABNORMAL HIGH (ref 2.6–18.5)

## 2024-07-03 NOTE — Consult Note (Addendum)
 "   Consultation  Referring Provider: TR[facility]/ Leotis Primary Care Physician:  Katina Pfeiffer, PA-C Primary Gastroenterologist:  unassigned  Reason for Consultation: Drop in hemoglobin on patient on IV heparin , concern for potential GI bleeding  HPI: Evelyn Baker is a 64 y.o. female with history of hypertension, rheumatoid arthritis, mixed connective tissue disease, and prior CVA as well as chronic anemia. Patient was admitted to the hospital 5 days ago after she presented with progressive cough and dyspnea. Subsequent workup including echo done on 06/29/2024 showed a large pericardial effusion, no right ventricular collapse to suggest tamponade, EF 60 to 65%.  Felt to possibly have pericarditis component, tentative plan is for pericardiocentesis tomorrow 07/04/2024.  Also volume overloaded on admission and has been diuresing.  Patient had been on Eliquis  as an outpatient for recent DVT, and was transition to IV heparin  here.  Patient has history of chronic anemia, iron  deficiency, and is being followed by hematology here in the hospital due to decline in hemoglobin which is felt secondary to underlying chronic kidney disease. 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 She had recent IV iron  infusions, folate, Retacrit  and B12 injection during her last admission.  She had been on ESA with last dose 06/13/2024. He is being continued on folic acid  supplementation, and received an injection of darbepoetin on 07/02/2023 which will be continued weekly Repeat iron  studies showed a high iron  level so not felt that she would benefit from further IV iron .  On admission 06/27/2024 hemoglobin was 7.4/hematocrit 21.0 1/1 hemoglobin 7.7 1/2 hemoglobin 6.9 1/3 hemoglobin 6.8 1/4 hemoglobin 6.3.  Patient says she feels okay was able to walk the entire hallway today without any severe shortness of breath.  She has no current GI symptoms.  She says that she has been having normal brown  stools, she has been started on oral iron  and usually the stool after the iron  will be darker in color.  She is not sure she is tolerating the iron  well because she gets nauseated if she does not have food on her stomach with doses.  She has no complaints of abdominal pain, no heartburn or indigestion, no dysphagia or dyne aphasia. She reports 1 colonoscopy greater than 10 years ago done at [facility] and believes that was negative, no prior EGD  CT abdomen pelvis on 06/10/2024-done for  concern for retroperitoneal hematoma-showed a small hiatal hernia Findings of volume overload, otherwise negative exam and no evidence for retroperitoneal hematoma.   Patient has not had Hemoccult sent-I did rectal today and stool is brown and Hemoccult negative    Past Medical History:  Diagnosis Date   Asthma    Depression    Dyspnea    Hyperlipidemia    Hypertension    Hypothyroidism    Sciatic nerve disease    Thyroid  disease     Past Surgical History:  Procedure Laterality Date   FOOT SURGERY Left    STRABISMUS SURGERY     age 73   TOTAL ABDOMINAL HYSTERECTOMY     VEIN LIGATION AND STRIPPING Left 01/22/2021   Procedure: LEFT GREATER SAPHENOUS VEIN  LIGATION AND STRIPPING;  Surgeon: Harvey Carlin BRAVO, MD;  Location: [facility] OR;  Service: Vascular;  Laterality: Left;    Prior to Admission medications  Medication Sig Start Date End Date Taking? Authorizing Provider  acetaminophen  (TYLENOL ) 500 MG tablet Take 2 tablets (1,000 mg total) by mouth every 8 (eight) hours as needed. 06/13/24  Yes Sherrill Cable [place], DO  albuterol  (VENTOLIN  HFA) 108 (90 Base) MCG/ACT inhaler Inhale 2 puffs into the lungs every 4 (four) hours as needed for wheezing or shortness of breath. 01/22/21  Yes Bethanie Cough, PA-C  amLODipine  (NORVASC ) 10 MG tablet Take 1 tablet (10 mg total) by mouth daily. 06/13/24  Yes Sheikh, Omair Latif, DO  apixaban  (ELIQUIS ) 5 MG TABS tablet Take 1 tablet (5 mg total) by mouth 2 (two) times  daily. 06/14/24  Yes Pasam, Avinash, MD  Ascorbic Acid (VITAMIN C) 100 MG tablet Take 100 mg by mouth daily.   Yes [provider]  azathioprine  (IMURAN ) 100 MG tablet Take 1 tablet (100 mg total) by mouth daily as directed 04/25/24  Yes Deveshwar, Maya, MD  budesonide -formoterol  (SYMBICORT ) 160-4.5 MCG/ACT inhaler Inhale 2 puffs into the lungs 2 (two) times daily. 06/15/24  Yes Hunsucker, Cough SAUNDERS, MD  carvedilol  (COREG ) 6.25 MG tablet Take 1 tablet (6.25 mg total) by mouth 2 (two) times daily with a meal. 06/13/24  Yes Sheikh, Omair Latif, DO  cyanocobalamin  1000 MCG tablet Take 1 tablet (1,000 mcg total) by mouth daily. 06/13/24  Yes Sheikh, Omair Latif, DO  EPINEPHrine  0.3 mg/0.3 mL IJ SOAJ injection Inject 0.3 mg into the muscle as needed as directed. 02/11/24  Yes   ferrous sulfate  325 (65 FE) MG tablet Take 1 tablet (325 mg total) by mouth 3 (three) times daily with meals. 06/13/24  Yes Sheikh, Omair Latif, DO  folic acid  (FOLVITE ) 1 MG tablet Take 1 tablet (1 mg total) by mouth daily. 06/14/24  Yes Pasam, Avinash, MD  hydrALAZINE  (APRESOLINE ) 25 MG tablet Take 1 tablet (25 mg) once daily as needed for systolic blood pressure > 170 Patient taking differently: Take 25 mg by mouth daily. 06/03/24  Yes Weaver, Scott T, PA-C  isosorbide -hydrALAZINE  (BIDIL ) 20-37.5 MG tablet Take 1 tablet by mouth 3 (three) times daily. 06/02/24  Yes Tolia, Sunit, DO  levothyroxine  (SYNTHROID ) 112 MCG tablet Take 1 tablet (112 mcg total) by mouth in the morning on an empty stomach. 11/09/23  Yes   ondansetron  (ZOFRAN ) 4 MG tablet Take 1 tablet (4 mg total) by mouth every 6 (six) hours as needed for nausea. 06/13/24  Yes Sheikh, Omair Latif, DO  pantoprazole  (PROTONIX ) 40 MG tablet Take 1 tablet (40 mg total) by mouth 2 (two) times daily. 06/13/24  Yes Sheikh, Omair Latif, DO  potassium chloride  (MICRO-K ) 10 MEQ CR capsule Take 1 capsule (10 mEq total) by mouth 2 (two) times daily. 04/04/24  Yes Tolia, Sunit,  DO  rosuvastatin  (CRESTOR ) 10 MG tablet Take 1 tablet (10 mg total) by mouth daily. 04/06/24 07/05/24 Yes Tolia, Sunit, DO  senna-docusate (SENOKOT-S) 8.6-50 MG tablet Take 1 tablet by mouth at bedtime as needed for mild constipation. 06/13/24  Yes Sherrill Cable Latif, DO    Current Facility-Administered Medications  Medication Dose Route Frequency Provider Last Rate Last Admin   acetaminophen  (TYLENOL ) tablet 650 mg  650 mg Oral Q6H PRN Danton Reyes DASEN, MD   650 mg at 06/30/24 2116   albuterol  (PROVENTIL ) (2.5 MG/3ML) 0.083% nebulizer solution 2.5 mg  2.5 mg Nebulization Q4H PRN Jerrol Agent, MD       azaTHIOprine  (IMURAN ) tablet 100 mg  100 mg Oral Daily Jerrol Agent, MD   100 mg at 07/03/24 1022   carvedilol  (COREG ) tablet 6.25 mg  6.25 mg Oral BID WC Jerrol Agent, MD   6.25 mg at 07/03/24 1017   colchicine  tablet 0.6 mg  0.6 mg Oral BID Pietro Rogue  S, MD   0.6 mg at 07/03/24 1017   Darbepoetin Alfa  (ARANESP ) injection 100 mcg  100 mcg Subcutaneous Q7 days Lonn Hicks, MD   100 mcg at 07/01/24 2053   ferrous sulfate  tablet 325 mg  325 mg Oral TID WC Jerrol Agent, MD   325 mg at 07/03/24 1510   fluticasone  furoate-vilanterol (BREO ELLIPTA ) 200-25 MCG/ACT 1 puff  1 puff Inhalation Daily Jerrol Agent, MD   1 puff at 06/29/24 9162   folic acid  (FOLVITE ) tablet 1 mg  1 mg Oral Daily Jerrol Agent, MD   1 mg at 07/03/24 1015   furosemide  (LASIX ) tablet 20 mg  20 mg Oral BID Thukkani, Arun K, MD   20 mg at 07/03/24 1017   guaiFENesin -dextromethorphan  (ROBITUSSIN DM) 100-10 MG/5ML syrup 5 mL  5 mL Oral Q4H PRN Leotis Bogus, MD   5 mL at 06/30/24 2110   heparin  ADULT infusion 100 units/mL (25000 units/250mL)  1,400 Units/hr Intravenous Continuous Leotis Bogus, MD 14 mL/hr at 07/03/24 1210 1,400 Units/hr at 07/03/24 1210   hydrALAZINE  (APRESOLINE ) tablet 25 mg  25 mg Oral Daily PRN Jerrol Agent, MD       isosorbide -hydrALAZINE  (BIDIL ) 20-37.5 MG per tablet 1 tablet  1 tablet Oral  TID Jerrol Agent, MD       levothyroxine  (SYNTHROID ) tablet 112 mcg  112 mcg Oral q AM Danton Reyes DASEN, MD   112 mcg at 07/03/24 9358   ondansetron  (ZOFRAN ) tablet 4 mg  4 mg Oral Q6H PRN Danton Reyes DASEN, MD       Or   ondansetron  (ZOFRAN ) injection 4 mg  4 mg Intravenous Q6H PRN Danton Reyes DASEN, MD       pantoprazole  (PROTONIX ) EC tablet 40 mg  40 mg Oral BID Jerrol Agent, MD   40 mg at 07/03/24 1015   phosphorus (K PHOS  NEUTRAL) tablet 250 mg  250 mg Oral BID Leotis Bogus, MD   250 mg at 07/03/24 1016   senna (SENOKOT) tablet 8.6 mg  1 tablet Oral BID Danton Reyes T, MD   8.6 mg at 07/01/24 2053   senna-docusate (Senokot-S) tablet 1 tablet  1 tablet Oral QHS PRN Jerrol Agent, MD        Allergies as of 06/27/2024 - Reviewed 06/27/2024  Allergen Reaction Noted   Shellfish allergy Anaphylaxis and Swelling 02/19/2012   Avocado Swelling 01/17/2021   Codeine  Other (See Comments) 04/02/2018   Iodinated contrast media Other (See Comments) 05/05/2022   Pineapple Swelling 01/17/2021   Penicillins Rash 01/17/2021    Family History  Problem Relation Age of Onset   Heart failure Mother    Kidney failure Mother    Stroke Sister    Aneurysm Sister    Heart Problems Brother    Hypertension Son    Hypertension Daughter     Social History   Socioeconomic History   Marital status: Married    Spouse name: Not on file   Number of children: 4   Years of education: Not on file   Highest education level: Not on file  Occupational History   Not on file  Tobacco Use   Smoking status: Never    Passive exposure: Never   Smokeless tobacco: Never  Vaping Use   Vaping status: Never Used  Substance and Sexual Activity   Alcohol use: No   Drug use: No   Sexual activity: Yes    Birth control/protection: Surgical    Comment: partial hysterectomy  Other Topics Concern  Not on file  Social History Narrative   Not on file   Social Drivers of Health   Tobacco Use: Low  Risk (06/28/2024)   Patient History    Smoking Tobacco Use: Never    Smokeless Tobacco Use: Never    Passive Exposure: Never  Financial Resource Strain: Not on file  Food Insecurity: No Food Insecurity (06/28/2024)   Epic    Worried About Programme Researcher, Broadcasting/film/video in the Last Year: Never true    Ran Out of Food in the Last Year: Never true  Transportation Needs: No Transportation Needs (06/28/2024)   Epic    Lack of Transportation (Medical): No    Lack of Transportation (Non-Medical): No  Physical Activity: Not on file  Stress: Not on file  Social Connections: Socially Integrated (06/28/2024)   Social Connection and Isolation Panel    Frequency of Communication with Friends and Family: More than three times a week    Frequency of Social Gatherings with Friends and Family: More than three times a week    Attends Religious Services: More than 4 times per year    Active Member of [employer] or Organizations: Yes    Attends Engineer, Structural: More than 4 times per year    Marital Status: Married  Catering Manager Violence: Not At Risk (06/28/2024)   Epic    Fear of Current or Ex-Partner: No    Emotionally Abused: No    Physically Abused: No    Sexually Abused: No  Depression (PHQ2-9): Not on file  Alcohol Screen: Not on file  Housing: Low Risk (06/28/2024)   Epic    Unable to Pay for Housing in the Last Year: No    Number of Times Moved in the Last Year: 0    Homeless in the Last Year: No  Utilities: Not At Risk (06/28/2024)   Epic    Threatened with loss of utilities: No  Health Literacy: Not on file    Review of Systems: Pertinent positive and negative review of systems were noted in the above HPI section.  All other review of systems was otherwise negative.   Physical Exam: Vital signs in last 24 hours: Temp:  [98.8 F (37.1 C)-99.8 F (37.7 C)] 99 F (37.2 C) (01/04 0858) Pulse Rate:  [87-96] 91 (01/04 0858) Resp:  [17-18] 17 (01/04 0858) BP: (122-152)/(69-80)  138/80 (01/04 0858) SpO2:  [94 %-97 %] 97 % (01/04 0858) Last BM Date : 07/02/24 General:   Alert,  Well-developed, well-nourished, older African-American female pleasant and cooperative in NAD, family at bedside Head:  Normocephalic and atraumatic. Eyes:  Sclera clear, no icterus.   Conjunctiva pale Ears:  Normal auditory acuity. Nose:  No deformity, discharge,  or lesions. Mouth:  No deformity or lesions.   Neck:  Supple; no masses or thyromegaly. Lungs:  Clear throughout to auscultation.   No wheezes, crackles, or rhonchi.  Heart:  Regular rate and rhythm; no murmurs, clicks, rubs,  or gallops. Abdomen:  Soft,nontender, BS active,nonpalp mass or hsm.   Rectal: Brown Hemoccult negative stool, rectal exam done by me Msk:  Symmetrical without gross deformities. . Pulses:  Normal pulses noted. Extremities:  trace edema Neurologic:  Alert and  oriented x4;  grossly normal neurologically. Skin:  Intact without significant lesions or rashes.. Psych:  Alert and cooperative. Normal mood and affect.  Intake/Output from previous day: 01/03 0701 - 01/04 0700 In: 186.7 [P.O.:100; I.V.:86.7] Out: 300 [Urine:300] Intake/Output this shift: Total I/O In: 120 [P.O.:120]  Out: -   Lab Results: Recent Labs    07/01/24 0210 07/02/24 0847 07/03/24 0237  WBC 7.6 5.9 6.4  HGB 6.9* 6.8* 6.3*  HCT 19.7* 19.5* 18.0*  PLT 244 258 252   BMET Recent Labs    07/01/24 0210  NA 132*  K 3.9  CL 95*  CO2 29  GLUCOSE 94  BUN 31*  CREATININE 1.56*  CALCIUM  8.2*   LFT No results for input(s): PROT, ALBUMIN , AST, ALT, ALKPHOS, BILITOT, BILIDIR, IBILI in the last 72 hours. PT/INR No results for input(s): LABPROT, INR in the last 72 hours. Hepatitis Panel No results for input(s): HEPBSAG, HCVAB, HEPAIGM, HEPBIGM in the last 72 hours.   IMPRESSION:  #1 pleasant 64 year old African-American female with progressive anemia in setting of chronic anemia felt secondary to  chronic kidney disease and iron  deficiency. Hemoglobin has dropped from 6.9 to 2 days ago to 6.3 today  She is being followed by hematology and has been initiated on folic acid  supplementation, and received an injection of darbepoetin on 07/02/2023 with plans for weekly doses. She received IV iron  etc. during last admission and iron  studies here show iron  high so no need for further IV iron .  Patient has no GI symptoms  Rectal exam done by myself today brown Hemoccult negative stool  She did have a CT scan done a few weeks ago due to concerns for possible retroperitoneal hematoma and that was negative.  He has been anticoagulated with Eliquis  and is now on heparin .  Doubt retroperitoneal hematoma but if there is concern then could repeat CT  With heme-negative stool there is not an indication for EGD  #2 chronic kidney disease #3.  Left lower extremity DVT  #4 pericardial effusion, pulmonary edema and new CHF-patient is tentatively scheduled for pericardiocentesis tomorrow  #5 prior history of CVA 6.  Rheumatoid arthritis 7.  Mixed connective tissue disease   PLAN: Could cover her prophylactically with daily PPI while anticoagulated No indication for EGD with Hemoccult negative stool Continue regimen initiated by hematology, minimize amounts of blood draws  If further drop in hemoglobin could consider repeat CT though she had one a couple of weeks ago with concerns for retroperitoneal hematoma and that was negative  GI will be available if needed   Amy EsterwoodPA-C  07/03/2024, 4:11 PM  I have taken an interval history, thoroughly reviewed the chart and examined the patient. I agree with the Advanced Practitioner's note, impression and recommendations, and have recorded additional findings, impressions and recommendations below. I performed a substantive portion of this encounter (>50% time spent), including a complete performance of the medical decision making.  My additional  thoughts are as follows:  Anemia of chronic kidney disease, heme-negative, under long-term management forward by hematology. Normal AC and is scheduled for pericardiocentesis tomorrow  GI bleeding at present, so no current indication for endoscopic procedures.  Inpatient GI service can see again as needed _________________  This consultation required a moderate degree of medical decision making due to the nature and complexity of the acute condition(s) being evaluated as well as the patient's medical comorbidities.  Victory LITTIE Brand III Office:[phone]     "

## 2024-07-03 NOTE — Progress Notes (Signed)
 "  Rounding Note   Patient Name: Evelyn Baker Date of Encounter: 07/03/2024  [facility] Cardiologist: Madonna Large, DO   Subjective - No acute events overnight -No complaints today.  Says that she feels well. - Hemoglobin continues to decline.  Scheduled Meds:  azaTHIOprine   100 mg Oral Daily   carvedilol   6.25 mg Oral BID WC   colchicine   0.6 mg Oral BID   Darbepoetin Alfa   100 mcg Subcutaneous Q7 days   ferrous sulfate   325 mg Oral TID WC   fluticasone  furoate-vilanterol  1 puff Inhalation Daily   folic acid   1 mg Oral Daily   furosemide   20 mg Oral BID   isosorbide -hydrALAZINE   1 tablet Oral TID   levothyroxine   112 mcg Oral q AM   pantoprazole   40 mg Oral BID   phosphorus  250 mg Oral BID   senna  1 tablet Oral BID   Continuous Infusions:  heparin  1,400 Units/hr (07/02/24 1236)   PRN Meds: acetaminophen  **OR** [DISCONTINUED] acetaminophen , albuterol , guaiFENesin -dextromethorphan , hydrALAZINE , ondansetron  **OR** ondansetron  (ZOFRAN ) IV, senna-docusate   Vital Signs  Vitals:   07/02/24 1655 07/02/24 2025 07/02/24 2340 07/03/24 0858  BP: 124/79 (!) 152/78 122/69 138/80  Pulse: 96 94 87 91  Resp: 17 18  17   Temp: 99.3 F (37.4 C) 98.8 F (37.1 C) 99.8 F (37.7 C) 99 F (37.2 C)  TempSrc: Oral Oral Oral Oral  SpO2: 97% 96% 94% 97%  Weight:      Height:        Intake/Output Summary (Last 24 hours) at 07/03/2024 1038 Last data filed at 07/03/2024 0900 Gross per 24 hour  Intake 306.72 ml  Output 300 ml  Net 6.72 ml      07/02/2024    3:04 AM 06/30/2024    8:22 AM 06/30/2024    5:00 AM  Last 3 Weights  Weight (lbs) 194 lb 14.2 oz 193 lb 3.2 oz 192 lb 3.9 oz  Weight (kg) 88.4 kg 87.635 kg 87.2 kg      Telemetry NSR- Personally Reviewed  ECG  No new ECG  Physical Exam  GEN: No acute distress.   Neck: No JVD Cardiac: RRR, no murmurs, rubs, or gallops.  Respiratory: Clear to auscultation bilaterally. GI: Soft, nontender, non-distended  MS:  Trace bilateral pitting edema; some skin discoloration over the left anterior shin (improving) Neuro:  Nonfocal  Psych: Normal affect   Labs High Sensitivity Troponin:  No results for input(s): TROPONINIHS in the last 720 hours.  Recent Labs  Lab 06/27/24 1502 06/27/24 1714  TRNPT 43* 39*       Chemistry Recent Labs  Lab 06/27/24 1502 06/29/24 0339 06/30/24 0422 07/01/24 0210  NA 132* 134* 135 132*  K 3.7 3.4* 3.4* 3.9  CL 92* 95* 96* 95*  CO2 28 28 30 29   GLUCOSE 96 120* 94 94  BUN 35* 44* 36* 31*  CREATININE 1.90* 1.60* 1.42* 1.56*  CALCIUM  8.7* 8.8* 8.9 8.2*  MG  --   --  1.9 1.7  PROT 7.1 6.6  --   --   ALBUMIN  2.8* 2.7*  --   --   AST 63* 74*  --   --   ALT 40 51*  --   --   ALKPHOS 84 73  --   --   BILITOT 0.7 0.3  --   --   GFRNONAA 30* 36* 42* 37*  ANIONGAP 12 12 9 8     Lipids No results  for input(s): CHOL, TRIG, HDL, LABVLDL, LDLCALC, CHOLHDL in the last 168 hours.  Hematology Recent Labs  Lab 07/01/24 0210 07/01/24 1921 07/02/24 0847 07/03/24 0237  WBC 7.6  --  5.9 6.4  RBC 2.67* 2.60* 2.61* 2.46*  HGB 6.9*  --  6.8* 6.3*  HCT 19.7*  --  19.5* 18.0*  MCV 73.8*  --  74.7* 73.2*  MCH 25.8*  --  26.1 25.6*  MCHC 35.0  --  34.9 35.0  RDW 19.5*  --  20.1* 19.7*  PLT 244  --  258 252   Thyroid  No results for input(s): TSH, FREET4 in the last 168 hours.  BNP Recent Labs  Lab 06/27/24 1506 06/29/24 0339  PROBNP 2,589.0* 1,724.0*    DDimer No results for input(s): DDIMER in the last 168 hours.   Radiology  No results found.     Patient Profile   64 y.o. female Jehovah's Witness, history of CVA, hypertension, hyperlipidemia, mixed connective tissue disease, rheumatoid arthritis for evaluation of pericardial effusion.  Venous ultrasound June 03, 2024 worrisome for left common femoral vein nonocclusive thrombus.  Follow-up VQ scan not suggestive of pulmonary embolus.  Echocardiogram December 31 showed normal LV function,  mild left ventricular hypertrophy, grade 1 diastolic dysfunction, large pericardial effusion but no evidence of tamponade.   Assessment & Plan   #Pericardial Effusion #Possible Pericarditis - Admitted with signs and symptoms of volume overload and underwent echocardiogram demonstrating a moderate pericardial effusion. - The patient had some SOB and swelling, but was not terribly symptomatic. - No evidence of tamponade by echocardiogram; however, serial echoes demonstrated slight increase in the size of the effusion as well as some respiratory flow variation thus prompting definitive treatment with pericardiocentesis. -Inflammatory markers were elevated suggesting possible pericarditis as an etiology for the effusion, but it is unclear at this time. - Tentative plan for pericardiocentesis on Monday 07/04/24; however, the patient's hemoglobin continues to decline.  If her hemoglobin worsens then may have to postpone procedure Tentative plan for pericardiocentesis on Monday, 07/04/2024, but may have to postpone if hemoglobin continues to fall. N.p.o. at midnight Continue colchicine  0.6 mg twice daily for presumed pericarditis.  Not receiving NSAIDs due to profound anemia. Could consider cardiac MRI following pericardiocentesis to confirm the diagnosis of pericarditis since it is unclear at this time.  #Acute on Chronic HFpEF - Found to be volume overloaded on admission and is s/p IV diuresis. - Currently being managed by p.o. Lasix . - Volume status looks pretty good apart from some mild peripheral edema. - No further changes at this time. Continue Lasix  20 mg p.o. twice daily Can consider SGLT2 or MRA if renal function improves  #HTN - Has had historically challenging blood pressures to control, but she is currently normotensive. - Continue her current regimen. Continue carvedilol  6.25 mg twice daily Continue BiDil  20/37.5 mg twice daily  #Recent DVT - Was originally on Eliquis  and then  transition to a heparin  drip pending pericardiocentesis. - Once safe from a procedural and anemia standpoint can transition back to Eliquis . Continue heparin  drip pending pericardiocentesis  #Severe Anemia - Hemoglobin has been <7 with hematology following thinking that this is anemia of CKD and IDA. - The patient is a Jehovah's Witness so cannot accept blood transfusion.  No overt bleeding. - Will hold off on initiating NSAIDs until hemoglobin improves. - No overt signs of bleeding but recommend speaking with GI to see if there is any utility in endoscopy given the ongoing drop in hemoglobin.   #  AKI on CKD Management per primary      For questions or updates, please contact [facility] Please consult [email or web address] for contact info under       Signed, Georganna Archer, MD  07/03/2024, 10:38 AM    "

## 2024-07-03 NOTE — Progress Notes (Signed)
 PHARMACY - ANTICOAGULATION CONSULT NOTE  Pharmacy Consult for heparin  Indication: DVT  Allergies[1]  Patient Measurements: Height: 5' 5 (165.1 cm) Weight: 88.4 kg (194 lb 14.2 oz) IBW/kg (Calculated) : 57 HEPARIN  DW (KG): 76.4  Vital Signs: Temp: 99.8 F (37.7 C) (01/03 2340) Temp Source: Oral (01/03 2340) BP: 122/69 (01/03 2340) Pulse Rate: 87 (01/03 2340)  Labs: Recent Labs    07/01/24 0210 07/01/24 1921 07/02/24 0847 07/02/24 1858 07/03/24 0237  HGB 6.9*  --  6.8*  --  6.3*  HCT 19.7*  --  19.5*  --  18.0*  PLT 244  --  258  --  252  APTT  --    < > 61* 69* 89*  HEPARINUNFRC  --   --  >1.10*  --  0.81*  CREATININE 1.56*  --   --   --   --    < > = values in this interval not displayed.    Estimated Creatinine Clearance: 41.6 mL/min (A) (by C-G formula based on SCr of 1.56 mg/dL (H)).   Medical History: Past Medical History:  Diagnosis Date   Asthma    Depression    Dyspnea    Hyperlipidemia    Hypertension    Hypothyroidism    Sciatic nerve disease    Thyroid  disease     Medications:   azaTHIOprine   100 mg Oral Daily   carvedilol   6.25 mg Oral BID WC   colchicine   0.6 mg Oral BID   Darbepoetin Alfa   100 mcg Subcutaneous Q7 days   ferrous sulfate   325 mg Oral TID WC   fluticasone  furoate-vilanterol  1 puff Inhalation Daily   folic acid   1 mg Oral Daily   furosemide   20 mg Oral BID   isosorbide -hydrALAZINE   1 tablet Oral TID   levothyroxine   112 mcg Oral q AM   pantoprazole   40 mg Oral BID   phosphorus  250 mg Oral BID   senna  1 tablet Oral BID     Assessment: Evelyn Baker is a 58 YOF on Eliquis  PTA for recent DVT in left femoral vein detected on 06/03/24. Eliquis  continued this admission. Patient also noted to have a pericardial effusion on TTE, noted to be larger on 07/01/24. Hgb also low to 6.9 mg/dL today, patient noted to be Jehovah witness which is restricting blood transfusion per MD. Plan to transition patient from Eliquis  to heparin  today.  Pharmacy consulted for heparin  dosing. Last dose of Eliquis  administered on 06/30/24 at 2100.   01/03 PM update: aPTT 69 is therapeutic on 1400 units/hr. No issues with the infusion or bleeding reported.  01/04: confirmatory aPTT 84 is therapeutic on 1400 units/hr. No issues with the infusion or bleeding reported. Hgb did fall to 6.3, all other CBC labs stable.  Goal of Therapy:  Heparin  level 0.3-0.7 units/ml aPTT 66-102 Monitor platelets by anticoagulation protocol: Yes   Plan:  Continue heparin  infusion at 1400 units/hr  Monitor anti-Xa level and aPTT daily until correlating Monitor CBC and s/sx of bleeding daily  R. Samual Satterfield, PharmD PGY-1 Acute Care Pharmacy Resident [facility] Please refer to [facility] for [facility] Pharmacy numbers 07/03/2024 7:34 AM          [1]  Allergies Allergen Reactions   Shellfish Allergy Anaphylaxis and Swelling   Avocado Swelling    Lips   Codeine  Other (See Comments)    Constipation   Iodinated Contrast Media Other (See Comments)   Mushroom Other (See Comments)  Itchy throat   Pineapple Swelling    Lip swelling    Penicillins Rash    Reaction: Childhood

## 2024-07-03 NOTE — Progress Notes (Signed)
 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 Hematology saw her during that admission, and she was dosed with IV Fe and epo.  She returned to [facility] 06/27/24 reporting a persistent cough since her discharge with gradually increasing exertional shortness of breath and severe orthopnea.  Patient was admitted for acute pulmonary edema started on IV Lasix .  Assessment & Plan:   Principal Problem:   Congestive heart failure (HCC) Active Problems:   Pulmonary edema cardiac cause (HCC)   Pericardial effusion   Anemia due to chronic kidney disease   Acute on chronic heart failure with preserved ejection fraction (HFpEF) (HCC)   Acute / Subacute pulmonary edema of unclear etiology: Pericardial Effusion : Suspected newly diagnosed CHF. Continue to diurese with Lasix  20 mg IV q 12hr. Monitor daily weight, intake output charting. Echocardiogram shows LVEF 60 to 65%, large pericardial effusion but no tamponade signs. Patient is hemodynamically stable,  no signs of tamponade. Cardiology was consulted.  Patient did have bronchitis earlier this month,  also has mixed connective tissue disease.   CRP and ESR elevated.  Cardiology recommended colchicine  0.6 mg twice daily,  would like to avoid NSAIDs due to Eliquis . Repeat echocardiogram showed increasing size of pericardial effusion with early signs of tamponade. Cardiology tentatively scheduled patient for pericardiocentesis on Monday,  pending improvement in hemoglobin.   Left femoral vein DVT: No evidence of PE during recent admission. Eliquis  is held and patient started on IV  heparin . Patient has noted significant decrease in the swelling of her LLE while on anticoaguation.   AKI on CKD IIIa: Creatinine has fluctuated while being on diuresis.  Continue to monitor serum creatinine, avoid nephrotoxic medications.   Hyponatremia: Possibly due to volume overload - Follow trend w/ diuresis  Improved.  HTN: BP controlled at present - follow w/ diuresis.   Hyperlipidemia: Continue home medications.   Hypothyroidism: Continue levothyroxine .   MCTD: On chronic azathioprine .  Hypochromic normocytic anemia: Continue iron  supplementation. Hemoglobin dropped to 6.9.  Patient is Jevoh witness , strictly refuses to have blood transfusion. Hematology consulted for recommendations given patient is tentatively scheduled for pericardiocentesis, pending improvement in hemoglobin.  Hemoglobin dropped further to 6.3.  There is no obvious visible bleeding but GI is consulted.  DVT prophylaxis: Eliquis  Code Status:Full code Family Communication: Husband at bed side. Disposition Plan:     Status is: Inpatient Remains inpatient appropriate because: Patient admitted for CHF exacerbation,  also have anemia.  Found to have large pericardial effusion,  cardiology is consulted.  Repeat echo shows increasing size of pericardial effusion with early signs of tamponade.  Cardiology tentatively scheduled patient on Monday for pericardiocentesis.  Patient is not medically ready for discharge.    Consultants:   Cardiology Hematology  Procedures: Echocardiogram.  Antimicrobials: Anti-infectives (From admission, onward)    None      Subjective: Patient was seen and examined at bedside.  Overnight events noted. Patient was lying comfortably in bed ,  denies any chest pain,  shortness of breath.   She is hemodynamically stable.  Hemoglobin further dropped to 6.3. She is tentatively scheduled for pericardiocentesis tomorrow.  Objective: Vitals:   07/02/24  1655 07/02/24  2025 07/02/24 2340 07/03/24 0858  BP: 124/79 (!) 152/78 122/69 138/80  Pulse: 96 94 87 91  Resp: 17 18  17   Temp: 99.3 F (37.4 C) 98.8 F (37.1 C) 99.8 F (37.7 C) 99 F (37.2 C)  TempSrc: Oral Oral Oral Oral  SpO2: 97% 96% 94% 97%  Weight:      Height:        Intake/Output Summary (Last 24 hours) at 07/03/2024 1356 Last data filed at 07/03/2024 0900 Gross per 24 hour  Intake 120 ml  Output --  Net 120 ml   Filed Weights   06/30/24 0500 06/30/24 0822 07/02/24 0304  Weight: 87.2 kg 87.6 kg 88.4 kg    Examination:  General exam: Appears calm and comfortable, not in any acute distress. Respiratory system: CTA Bilaterally. Respiratory effort normal. RR 15 Cardiovascular system: S1 & S2 heard, RRR. No JVD, murmurs, rubs, gallops or clicks. No pedal edema. Gastrointestinal system: Abdomen is non distended, soft and non tender.  Normal bowel sounds heard. Central nervous system: Alert and oriented x 3. No focal neurological deficits. Extremities: Edema+, no cyanosis, no clubbing. Skin: No rashes, lesions or ulcers Psychiatry: Judgement and insight appear normal. Mood & affect appropriate.   Data Reviewed: I have personally reviewed following labs and imaging studies  CBC: Recent Labs  Lab 06/29/24 0339 06/30/24 0422 07/01/24 0210 07/02/24 0847 07/03/24 0237  WBC 10.8* 7.8 7.6 5.9 6.4  HGB 7.5* 7.7* 6.9* 6.8* 6.3*  HCT 21.1* 21.6* 19.7* 19.5* 18.0*  MCV 72.8* 73.0* 73.8* 74.7* 73.2*  PLT 339 272 244 258 252   Basic Metabolic Panel: Recent Labs  Lab 06/27/24 1502 06/29/24 0339 06/30/24 0422 07/01/24 0210  NA 132* 134* 135 132*  K 3.7 3.4* 3.4* 3.9  CL 92* 95* 96* 95*  CO2 28 28 30 29   GLUCOSE 96 120* 94 94  BUN 35* 44* 36* 31*  CREATININE 1.90* 1.60* 1.42* 1.56*  CALCIUM  8.7* 8.8* 8.9 8.2*  MG  --   --  1.9 1.7  PHOS  --   --  2.5 2.1*   GFR: Estimated Creatinine Clearance: 41.6 mL/min (A) (by C-G formula based on SCr of 1.56 mg/dL (H)). Liver Function  Tests: Recent Labs  Lab 06/27/24 1502 06/29/24 0339  AST 63* 74*  ALT 40 51*  ALKPHOS 84 73  BILITOT 0.7 0.3  PROT 7.1 6.6  ALBUMIN  2.8* 2.7*   No results for input(s): LIPASE, AMYLASE in the last 168 hours. No results for input(s): AMMONIA in the last 168 hours. Coagulation Profile: No results for input(s): INR, PROTIME in the last 168 hours. Cardiac Enzymes: No results for input(s): CKTOTAL, CKMB, CKMBINDEX, TROPONINI in the last 168 hours. BNP (last 3 results) Recent Labs    06/27/24 1506 06/29/24 0339  PROBNP 2,589.0* 1,724.0*   HbA1C: No results for input(s): HGBA1C in the last 72 hours. CBG: No results for input(s): GLUCAP in the last 168 hours. Lipid Profile: No results for input(s): CHOL, HDL, LDLCALC, TRIG, CHOLHDL, LDLDIRECT in the last 72 hours. Thyroid  Function Tests: No results for input(s): TSH, T4TOTAL, FREET4, T3FREE, THYROIDAB in the last 72 hours. Anemia Panel: Recent Labs    07/01/24 1921  FERRITIN 1,373*  TIBC 169*  IRON  24*  RETICCTPCT 2.2   Sepsis Labs: Recent Labs  Lab 06/27/24 1506  LATICACIDVEN 0.8    Recent Results (from the past 240 hours)  Blood culture (routine x 2)     Status: None  Collection Time: 06/27/24  3:06 PM   Specimen: BLOOD  Result Value Ref Range Status   Specimen Description   Final    BLOOD LEFT ANTECUBITAL Performed at [facility], [address] [place], [place] [address]    Special Requests   Final    Blood Culture results may not be optimal due to an inadequate volume of blood received in culture bottles BOTTLES DRAWN AEROBIC AND ANAEROBIC Performed at [facility], [address] [place], [place] [address]    Culture   Final    NO GROWTH 5 DAYS Performed at [facility], [address] [place], [place] [address]    Report Status 07/02/2024 FINAL  Final    Radiology Studies: No results found.  Scheduled  Meds:  azaTHIOprine   100 mg Oral Daily   carvedilol   6.25 mg Oral BID WC   colchicine   0.6 mg Oral BID   Darbepoetin Alfa   100 mcg Subcutaneous Q7 days   ferrous sulfate   325 mg Oral TID WC   fluticasone  furoate-vilanterol  1 puff Inhalation Daily   folic acid   1 mg Oral Daily   furosemide   20 mg Oral BID   isosorbide -hydrALAZINE   1 tablet Oral TID   levothyroxine   112 mcg Oral q AM   pantoprazole   40 mg Oral BID   phosphorus  250 mg Oral BID   senna  1 tablet Oral BID   Continuous Infusions:  heparin  1,400 Units/hr (07/03/24 1210)     LOS: 5 days    Time spent: 50 mins    Darcel Dawley, MD [facility]   If 7PM-7AM, please contact night-coverage  "

## 2024-07-04 ENCOUNTER — Encounter (HOSPITAL_COMMUNITY): Admission: EM | Disposition: A | Discharge status: Home / Self Care | Payer: Self-pay | Attending: Family Medicine

## 2024-07-04 DIAGNOSIS — I509 Heart failure, unspecified: Secondary | ICD-10-CM | POA: Diagnosis not present

## 2024-07-04 DIAGNOSIS — I3139 Other pericardial effusion (noninflammatory): Secondary | ICD-10-CM | POA: Diagnosis not present

## 2024-07-04 LAB — PROTIME-INR
INR: 1.2 (ref 0.8–1.2)
Prothrombin Time: 15.5 s — ABNORMAL HIGH (ref 11.4–15.2)

## 2024-07-04 LAB — CBC
HCT: 18.6 % — ABNORMAL LOW (ref 36.0–46.0)
Hemoglobin: 6.5 g/dL — CL (ref 12.0–15.0)
MCH: 25.8 pg — ABNORMAL LOW (ref 26.0–34.0)
MCHC: 34.9 g/dL (ref 30.0–36.0)
MCV: 73.8 fL — ABNORMAL LOW (ref 80.0–100.0)
Platelets: 273 K/uL (ref 150–400)
RBC: 2.52 MIL/uL — ABNORMAL LOW (ref 3.87–5.11)
RDW: 20.1 % — ABNORMAL HIGH (ref 11.5–15.5)
WBC: 5.5 K/uL (ref 4.0–10.5)
nRBC: 0 % (ref 0.0–0.2)

## 2024-07-04 LAB — HEPARIN LEVEL (UNFRACTIONATED): Heparin Unfractionated: 0.47 [IU]/mL (ref 0.30–0.70)

## 2024-07-04 LAB — APTT: aPTT: 84 s — ABNORMAL HIGH (ref 24–36)

## 2024-07-04 SURGERY — PERICARDIOCENTESIS
Anesthesia: LOCAL

## 2024-07-04 NOTE — Progress Notes (Signed)
 "  Rounding Note   Patient Name: Evelyn Baker Date of Encounter: 07/04/2024  [facility] Cardiologist: Madonna Large, DO   Subjective No chest pain, mild dyspnea, mild cough  Scheduled Meds:  azaTHIOprine   100 mg Oral Daily   carvedilol   6.25 mg Oral BID WC   colchicine   0.6 mg Oral BID   Darbepoetin Alfa   100 mcg Subcutaneous Q7 days   ferrous sulfate   325 mg Oral TID WC   fluticasone  furoate-vilanterol  1 puff Inhalation Daily   folic acid   1 mg Oral Daily   furosemide   20 mg Oral BID   isosorbide -hydrALAZINE   1 tablet Oral TID   levothyroxine   112 mcg Oral q AM   pantoprazole   40 mg Oral BID   phosphorus  250 mg Oral BID   senna  1 tablet Oral BID   Continuous Infusions:  heparin  1,400 Units/hr (07/03/24 1210)   PRN Meds: acetaminophen  **OR** [DISCONTINUED] acetaminophen , albuterol , guaiFENesin -dextromethorphan , hydrALAZINE , ondansetron  **OR** ondansetron  (ZOFRAN ) IV, senna-docusate   Vital Signs  Vitals:   07/03/24 2142 07/04/24 0040 07/04/24 0448 07/04/24 0730  BP: (!) 151/76 (!) 129/90 109/65 (!) 152/88  Pulse:  97 96 86  Resp:  18 18 18   Temp:  99.7 F (37.6 C) (!) 100.7 F (38.2 C) 99.4 F (37.4 C)  TempSrc:  Oral Oral Oral  SpO2:  95% 93% 95%  Weight:   86.8 kg   Height:        Intake/Output Summary (Last 24 hours) at 07/04/2024 0838 Last data filed at 07/03/2024 2000 Gross per 24 hour  Intake 580.11 ml  Output --  Net 580.11 ml      07/04/2024    4:48 AM 07/02/2024    3:04 AM 06/30/2024    8:22 AM  Last 3 Weights  Weight (lbs) 191 lb 4.8 oz 194 lb 14.2 oz 193 lb 3.2 oz  Weight (kg) 86.773 kg 88.4 kg 87.635 kg      Telemetry Sinus- Personally Reviewed   Physical Exam  GEN: NAD Neck: Supple Cardiac: RRR, no murmur Respiratory: CTA GI: Soft, NT/ND MS: No edema Neuro:  Grossly intact  Psych: Normal affect   Labs  Recent Labs  Lab 06/27/24 1502 06/27/24 1714  TRNPT 43* 39*       Chemistry Recent Labs  Lab 06/27/24 1502  06/29/24 0339 06/30/24 0422 07/01/24 0210  NA 132* 134* 135 132*  K 3.7 3.4* 3.4* 3.9  CL 92* 95* 96* 95*  CO2 28 28 30 29   GLUCOSE 96 120* 94 94  BUN 35* 44* 36* 31*  CREATININE 1.90* 1.60* 1.42* 1.56*  CALCIUM  8.7* 8.8* 8.9 8.2*  MG  --   --  1.9 1.7  PROT 7.1 6.6  --   --   ALBUMIN  2.8* 2.7*  --   --   AST 63* 74*  --   --   ALT 40 51*  --   --   ALKPHOS 84 73  --   --   BILITOT 0.7 0.3  --   --   GFRNONAA 30* 36* 42* 37*  ANIONGAP 12 12 9 8      Hematology Recent Labs  Lab 07/02/24 0847 07/03/24 0237 07/04/24 0235  WBC 5.9 6.4 5.5  RBC 2.61* 2.46* 2.52*  HGB 6.8* 6.3* 6.5*  HCT 19.5* 18.0* 18.6*  MCV 74.7* 73.2* 73.8*  MCH 26.1 25.6* 25.8*  MCHC 34.9 35.0 34.9  RDW 20.1* 19.7* 20.1*  PLT 258 252 273  BNP Recent Labs  Lab 06/27/24 1506 06/29/24 0339  PROBNP 2,589.0* 1,724.0*      Radiology  No results found.   Patient Profile   64 y.o. female Jehovah's Witness, history of CVA, hypertension, hyperlipidemia, mixed connective tissue disease, rheumatoid arthritis for evaluation of pericardial effusion.  Venous ultrasound June 03, 2024 worrisome for left common femoral vein nonocclusive thrombus.  Follow-up VQ scan not suggestive of pulmonary embolus.  Echocardiogram December 31 showed normal LV function, mild left ventricular hypertrophy, grade 1 diastolic dysfunction, large pericardial effusion but no evidence of tamponade.  Assessment & Plan   1 large pericardial effusion-etiology unclear.  Patient did have bronchitis earlier this month.  Question viral related.  Also with history of mixed connective tissue disease.  C-reactive protein elevated at 8.5 and sed rate 88.  Continue colchicine .  Patient not on NSAID due to need for anticoagulation and ongoing microcytic anemia.  Follow-up echocardiogram shows enlarging effusion.  Plan was for pericardiocentesis today.  However hemoglobin is 6.5 and patient is a Jehovah's Witness and will not except blood  products.  Given that she is hemodynamically stable our interventions would like to follow hemoglobin given recent ESA injection (per hematology would expect hemoglobin increase in 5 to 7 days following injection).  Will reassess tomorrow.    2 recent DVT-continue heparin  and resume apixaban  once all procedures complete.  3 hypertension-patient's blood pressure is controlled.  Continue present medications.  4 acute diastolic congestive heart failure/pleural effusions-continue Lasix  at present dose.  Check potassium and renal function tomorrow morning.  5 acute kidney injury-follow renal function with diuresis.  For questions or updates, please contact [facility] Please consult [email or web address] for contact info under       Signed, Redell Shallow, MD  07/04/2024, 8:38 AM    "

## 2024-07-04 NOTE — Plan of Care (Incomplete)
   Problem: Education: Goal: Knowledge of General Education information will improve Description: Including pain rating scale, medication(s)/side effects and non-pharmacologic comfort measures Outcome: Progressing   Problem: Activity: Goal: Risk for activity intolerance will decrease Outcome: Progressing   Problem: Coping: Goal: Level of anxiety will decrease Outcome: Progressing

## 2024-07-04 NOTE — Plan of Care (Signed)
" °  Problem: Education: Goal: Knowledge of General Education information will improve Description: Including pain rating scale, medication(s)/side effects and non-pharmacologic comfort measures 07/04/2024 1934 by Harl Sharlet BROCKS, RN Outcome: Progressing 07/04/2024 1933 by Harl Sharlet BROCKS, RN Outcome: Progressing   Problem: Clinical Measurements: Goal: Ability to maintain clinical measurements within normal limits will improve Outcome: Progressing   Problem: Activity: Goal: Risk for activity intolerance will decrease Outcome: Progressing   Problem: Coping: Goal: Level of anxiety will decrease Outcome: Progressing   "

## 2024-07-04 NOTE — Plan of Care (Signed)
   Problem: Education: Goal: Knowledge of General Education information will improve Description: Including pain rating scale, medication(s)/side effects and non-pharmacologic comfort measures Outcome: Progressing   Problem: Clinical Measurements: Goal: Will remain free from infection Outcome: Progressing   Problem: Coping: Goal: Level of anxiety will decrease Outcome: Progressing

## 2024-07-04 NOTE — Progress Notes (Signed)
 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  She was recently admitted for acute DVT, AKI and anemia.  Presented with increasing exertional shortness of breath, severe orthopnea.  Found to have active pulmonary edema.  Given IV Lasix .  Cardiology following.  Further workup showed large pericardial effusion.  Assessment & Plan:  Principal Problem:   Congestive heart failure (HCC) Active Problems:   Pulmonary edema cardiac cause (HCC)   Pericardial effusion   Anemia due to chronic kidney disease   Acute on chronic heart failure with preserved ejection fraction (HFpEF) (HCC)   Acute/subacute pulmonary edema/pericardial effusion: Suspected newly diagnosed CHF.  Initially given IV Lasix  but now changed to oral.  Continue to monitor daily weight, input/output.  Echo showed EF of 60 to 65%, large pericardial effusion but no tamponade.  Currently hemodynamically stable.  Cardiology following. History of recent  bronchitis, also has mixed connective tissue disease.  Elevated CRP, ESR.  Started on colchicine .  Repeat echocardiogram showed increasing size of pericardial effusion with early signs of tamponade.  Cardiology considering pericardiocentesis but waiting for improvement in the hemoglobin.  Left lower extremity DVT: Initially diagnosed.  Eliquis  has been held and is currently on IV heparin .  Significant improvement in the lower extremity edema  AKI on CKD stage IIIa: Currently kidney function around baseline( 1.5).  Follows with nephrology  Chronic hypochromic normocytic anemia/generalized weakness: Follows with hematology.  Hematology was consulted here.   Continue iron , folic acid  supplementation.  Hemoglobin in the range of 6.  Was given darbepoetin injection on January 2 and hematologist plan to continue weekly injection.  No evidence of GI bleed.  Patient CT scan done to rule out retroperitoneal hematoma was negative.  No indication for EGD as per GI , daily PPI to be continued.  If further drop in the hemoglobin, we can consider repeat CT  History of mixed currently tissue disorder: On chronic azathioprine   Obesity: BMI 31.8        DVT prophylaxis:Heparin  drip     Code Status: Full Code  Family Communication: Husband at bedside on 1/5  Patient status:Inpatient  Patient is from :home  Anticipated discharge un:ynfz  Estimated DC date: After complete workup   Consultants: Cardiology, hematology, GI  Procedures: None  Antimicrobials:  Anti-infectives (From admission, onward)    None       Subjective: Patient seen and examined at bedside today.  Hemodynamically stable.  Comfortable lying on bed.  No new complaints.  Speaking in full sentences.  On room air.  Denies any worsening shortness of breath or cough.  Objective: Vitals:   07/03/24 2142 07/04/24 0040 07/04/24 0448 07/04/24 0730  BP: (!) 151/76 (!) 129/90 109/65 (!) 152/88  Pulse:  97 96 86  Resp:  18 18 18   Temp:  99.7 F (37.6 C) (!) 100.7 F (38.2 C) 99.4 F (37.4 C)  TempSrc:  Oral Oral Oral  SpO2:  95% 93% 95%  Weight:   86.8 kg   Height:        Intake/Output Summary (Last 24 hours) at 07/04/2024 1304 Last data filed at 07/03/2024 2000 Gross per 24 hour  Intake 460.11 ml  Output --  Net 460.11 ml  Filed Weights   06/30/24 0822 07/02/24 0304 07/04/24 0448  Weight: 87.6 kg 88.4 kg 86.8 kg    Examination:  General exam: Overall comfortable, not in distress, obese HEENT: PERRL Respiratory system:  no wheezes or crackles  Cardiovascular system: S1 & S2 heard, RRR.  Gastrointestinal system: Abdomen is nondistended, soft and  nontender. Central nervous system: Alert and oriented Extremities: Bilateral trace lower extremity pitting edema, no clubbing ,no cyanosis Skin: No rashes, no ulcers,no icterus     Data Reviewed: I have personally reviewed following labs and imaging studies  CBC: Recent Labs  Lab 06/30/24 0422 07/01/24 0210 07/02/24 0847 07/03/24 0237 07/04/24 0235  WBC 7.8 7.6 5.9 6.4 5.5  HGB 7.7* 6.9* 6.8* 6.3* 6.5*  HCT 21.6* 19.7* 19.5* 18.0* 18.6*  MCV 73.0* 73.8* 74.7* 73.2* 73.8*  PLT 272 244 258 252 273   Basic Metabolic Panel: Recent Labs  Lab 06/27/24 1502 06/29/24 0339 06/30/24 0422 07/01/24 0210  NA 132* 134* 135 132*  K 3.7 3.4* 3.4* 3.9  CL 92* 95* 96* 95*  CO2 28 28 30 29   GLUCOSE 96 120* 94 94  BUN 35* 44* 36* 31*  CREATININE 1.90* 1.60* 1.42* 1.56*  CALCIUM  8.7* 8.8* 8.9 8.2*  MG  --   --  1.9 1.7  PHOS  --   --  2.5 2.1*     Recent Results (from the past 240 hours)  Blood culture (routine x 2)     Status: None   Collection Time: 06/27/24  3:06 PM   Specimen: BLOOD  Result Value Ref Range Status   Specimen Description   Final    BLOOD LEFT ANTECUBITAL Performed at [facility], [address], [place], [place] [address]    Special Requests   Final    Blood Culture results may not be optimal due to an inadequate volume of blood received in culture bottles BOTTLES DRAWN AEROBIC AND ANAEROBIC Performed at [facility], [address] [place], [place] [address]    Culture   Final    NO GROWTH 5 DAYS Performed at [facility], [address] [place], [place] [address]    Report Status 07/02/2024 FINAL  Final     Radiology Studies: No results found.  Scheduled Meds:  azaTHIOprine   100 mg Oral Daily   carvedilol   6.25 mg Oral BID WC   colchicine   0.6 mg Oral BID   Darbepoetin Alfa   100 mcg Subcutaneous Q7 days   ferrous sulfate   325 mg Oral TID WC   fluticasone  furoate-vilanterol  1 puff Inhalation Daily    folic acid   1 mg Oral Daily   furosemide   20 mg Oral BID   isosorbide -hydrALAZINE   1 tablet Oral TID   levothyroxine   112 mcg Oral q AM   pantoprazole   40 mg Oral BID   phosphorus  250 mg Oral BID   senna  1 tablet Oral BID   Continuous Infusions:  heparin  1,400 Units/hr (07/04/24 1149)     LOS: 6 days   Ivonne Mustache, MD [facility] P1/10/2024, 1:04 PM  "

## 2024-07-04 NOTE — Telephone Encounter (Signed)
 Wow. She has large pericardial effusion. This was absent on CT chest in fall of 2025. This is all fiarly new. Please ensure followup with APP in 2 weeks. Post hospital . Can be with me too

## 2024-07-04 NOTE — Progress Notes (Signed)
 PHARMACY - ANTICOAGULATION CONSULT NOTE  Pharmacy Consult for heparin  Indication: DVT  Allergies[1]  Patient Measurements: Height: 5' 5 (165.1 cm) Weight: 86.8 kg (191 lb 4.8 oz) IBW/kg (Calculated) : 57 HEPARIN  DW (KG): 76.4  Vital Signs: Temp: 100.7 F (38.2 C) (01/05 0448) Temp Source: Oral (01/05 0448) BP: 109/65 (01/05 0448) Pulse Rate: 96 (01/05 0448)  Labs: Recent Labs    07/02/24 0847 07/02/24 1858 07/03/24 0237 07/04/24 0235  HGB 6.8*  --  6.3* 6.5*  HCT 19.5*  --  18.0* 18.6*  PLT 258  --  252 273  APTT 61* 69* 89* 84*  LABPROT  --   --   --  15.5*  INR  --   --   --  1.2  HEPARINUNFRC >1.10*  --  0.81* 0.47    Estimated Creatinine Clearance: 41.2 mL/min (A) (by C-G formula based on SCr of 1.56 mg/dL (H)).   Assessment: Evelyn Baker is a 73 YOF on Eliquis  PTA for recent DVT in left femoral vein detected on 06/03/24. Eliquis  continued this admission. Patient also noted to have a pericardial effusion on TTE, noted to be larger on 07/01/24. Hgb also low to 6.9 mg/dL today, patient noted to be Jehovah witness which is restricting blood transfusion per MD. Plan to transition patient from Eliquis  to heparin  today. Pharmacy consulted for heparin  dosing. Last dose of Eliquis  administered on 06/30/24 at 2100.  aPTT 84 is therapeutic and heparin  level 0.47 is therapeutic on 1400 units/hr - aPTT and heparin  level are correlating. No bleeding reported. Hgb low stable 6s, platelets are normal.  Goal of Therapy:  Heparin  level 0.3-0.7 units/ml aPTT 66-102 Monitor platelets by anticoagulation protocol: Yes   Plan:  Continue heparin  infusion at 1400 units/hr  Daily heparin  level and CBC - stop aPTT Monitor for signs/symptoms of bleeding F/u after pericardiocentesis   Thank you for involving pharmacy in this patient's care.  Delon Sax, PharmD, BCPS Clinical Pharmacist Clinical phone for 07/04/2024 is [phone] 07/04/2024 7:53 AM      [1]  Allergies Allergen  Reactions   Shellfish Allergy Anaphylaxis and Swelling   Avocado Swelling    Lips   Codeine  Other (See Comments)    Constipation   Iodinated Contrast Media Other (See Comments)   Mushroom Other (See Comments)    Itchy throat   Pineapple Swelling    Lip swelling    Penicillins Rash    Reaction: Childhood

## 2024-07-04 NOTE — Telephone Encounter (Signed)
 Patient scheduled in 2 weeks with [facility] for hospital follow up.

## 2024-07-05 ENCOUNTER — Inpatient Hospital Stay (HOSPITAL_COMMUNITY)

## 2024-07-05 DIAGNOSIS — I3139 Other pericardial effusion (noninflammatory): Secondary | ICD-10-CM | POA: Diagnosis not present

## 2024-07-05 DIAGNOSIS — I509 Heart failure, unspecified: Secondary | ICD-10-CM | POA: Diagnosis not present

## 2024-07-05 LAB — BASIC METABOLIC PANEL WITH GFR
Anion gap: 10 (ref 5–15)
BUN: 18 mg/dL (ref 8–23)
CO2: 27 mmol/L (ref 22–32)
Calcium: 8.3 mg/dL — ABNORMAL LOW (ref 8.9–10.3)
Chloride: 96 mmol/L — ABNORMAL LOW (ref 98–111)
Creatinine, Ser: 1.55 mg/dL — ABNORMAL HIGH (ref 0.44–1.00)
GFR, Estimated: 38 mL/min — ABNORMAL LOW
Glucose, Bld: 87 mg/dL (ref 70–99)
Potassium: 3.6 mmol/L (ref 3.5–5.1)
Sodium: 133 mmol/L — ABNORMAL LOW (ref 135–145)

## 2024-07-05 LAB — APTT: aPTT: 86 s — ABNORMAL HIGH (ref 24–36)

## 2024-07-05 LAB — ECHOCARDIOGRAM LIMITED
Calc EF: 60.2 %
Height: 65 in
S' Lateral: 2.3 cm
Single Plane A2C EF: 64.9 %
Single Plane A4C EF: 55.9 %
Weight: 3060.8 [oz_av]

## 2024-07-05 LAB — CBC
HCT: 19 % — ABNORMAL LOW (ref 36.0–46.0)
Hemoglobin: 6.7 g/dL — CL (ref 12.0–15.0)
MCH: 26.5 pg (ref 26.0–34.0)
MCHC: 35.3 g/dL (ref 30.0–36.0)
MCV: 75.1 fL — ABNORMAL LOW (ref 80.0–100.0)
Platelets: 269 K/uL (ref 150–400)
RBC: 2.53 MIL/uL — ABNORMAL LOW (ref 3.87–5.11)
RDW: 20.1 % — ABNORMAL HIGH (ref 11.5–15.5)
WBC: 5 K/uL (ref 4.0–10.5)
nRBC: 0 % (ref 0.0–0.2)

## 2024-07-05 LAB — HEPARIN LEVEL (UNFRACTIONATED): Heparin Unfractionated: 0.35 [IU]/mL (ref 0.30–0.70)

## 2024-07-05 MED ORDER — PREDNISONE 20 MG PO TABS
20.0000 mg | ORAL_TABLET | Freq: Every day | ORAL | Status: DC
  Administered 2024-07-05 – 2024-07-06 (×2): 20 mg via ORAL
  Filled 2024-07-05 (×2): qty 1

## 2024-07-05 MED ORDER — ISOSORB DINITRATE-HYDRALAZINE 20-37.5 MG PO TABS
2.0000 | ORAL_TABLET | Freq: Three times a day (TID) | ORAL | Status: DC
  Filled 2024-07-05 (×3): qty 2

## 2024-07-05 MED ORDER — FUROSEMIDE 20 MG PO TABS
20.0000 mg | ORAL_TABLET | Freq: Every day | ORAL | Status: DC
  Administered 2024-07-06: 20 mg via ORAL
  Filled 2024-07-05: qty 1

## 2024-07-05 NOTE — Progress Notes (Signed)
 "  Rounding Note   Patient Name: Evelyn Baker Date of Encounter: 07/05/2024   [facility] Cardiologist: Madonna Large, DO   Subjective No chest pain or dyspnea  Scheduled Meds:  azaTHIOprine   100 mg Oral Daily   carvedilol   6.25 mg Oral BID WC   colchicine   0.6 mg Oral BID   Darbepoetin Alfa   100 mcg Subcutaneous Q7 days   ferrous sulfate   325 mg Oral TID WC   fluticasone  furoate-vilanterol  1 puff Inhalation Daily   folic acid   1 mg Oral Daily   furosemide   20 mg Oral BID   isosorbide -hydrALAZINE   1 tablet Oral TID   levothyroxine   112 mcg Oral q AM   pantoprazole   40 mg Oral BID   senna  1 tablet Oral BID   Continuous Infusions:  heparin  1,450 Units/hr (07/05/24 0726)   PRN Meds: acetaminophen  **OR** [DISCONTINUED] acetaminophen , albuterol , guaiFENesin -dextromethorphan , hydrALAZINE , ondansetron  **OR** ondansetron  (ZOFRAN ) IV, senna-docusate   Vital Signs  Vitals:   07/04/24 0730 07/04/24 1515 07/04/24 2319 07/05/24 0720  BP: (!) 152/88 (!) 149/89 (!) 154/90 (!) 162/94  Pulse: 86 100 (!) 102 (!) 102  Resp: 18 18 18 17   Temp: 99.4 F (37.4 C)  99.8 F (37.7 C) 99.5 F (37.5 C)  TempSrc: Oral  Oral Oral  SpO2: 95% 94% 93% 92%  Weight:      Height:        Intake/Output Summary (Last 24 hours) at 07/05/2024 0832 Last data filed at 07/04/2024 2100 Gross per 24 hour  Intake 360 ml  Output 600 ml  Net -240 ml      07/04/2024    4:48 AM 07/02/2024    3:04 AM 06/30/2024    8:22 AM  Last 3 Weights  Weight (lbs) 191 lb 4.8 oz 194 lb 14.2 oz 193 lb 3.2 oz  Weight (kg) 86.773 kg 88.4 kg 87.635 kg      Telemetry Sinus- Personally Reviewed   Physical Exam  GEN: NAD, WD, WN Neck: Supple, no adenopathy Cardiac: RRR Respiratory: CTA; no wheeze GI: Soft, NT/ND, no masses MS: No edema Neuro:  No focal findings Psych: Normal affect   Labs  Recent Labs  Lab 06/27/24 1502 06/27/24 1714  TRNPT 43* 39*       Chemistry Recent Labs  Lab 06/29/24 0339  06/30/24 0422 07/01/24 0210 07/05/24 0233  NA 134* 135 132* 133*  K 3.4* 3.4* 3.9 3.6  CL 95* 96* 95* 96*  CO2 28 30 29 27   GLUCOSE 120* 94 94 87  BUN 44* 36* 31* 18  CREATININE 1.60* 1.42* 1.56* 1.55*  CALCIUM  8.8* 8.9 8.2* 8.3*  MG  --  1.9 1.7  --   PROT 6.6  --   --   --   ALBUMIN  2.7*  --   --   --   AST 74*  --   --   --   ALT 51*  --   --   --   ALKPHOS 73  --   --   --   BILITOT 0.3  --   --   --   GFRNONAA 36* 42* 37* 38*  ANIONGAP 12 9 8 10      Hematology Recent Labs  Lab 07/03/24 0237 07/04/24 0235 07/05/24 0233  WBC 6.4 5.5 5.0  RBC 2.46* 2.52* 2.53*  HGB 6.3* 6.5* 6.7*  HCT 18.0* 18.6* 19.0*  MCV 73.2* 73.8* 75.1*  MCH 25.6* 25.8* 26.5  MCHC 35.0 34.9 35.3  RDW 19.7* 20.1* 20.1*  PLT 252 273 269    BNP Recent Labs  Lab 06/29/24 0339  PROBNP 1,724.0*      Radiology  No results found.   Patient Profile   64 y.o. female Jehovah's Witness, history of CVA, hypertension, hyperlipidemia, mixed connective tissue disease, rheumatoid arthritis for evaluation of pericardial effusion.  Venous ultrasound June 03, 2024 worrisome for left common femoral vein nonocclusive thrombus.  Follow-up VQ scan not suggestive of pulmonary embolus.  Echocardiogram December 31 showed normal LV function, mild left ventricular hypertrophy, grade 1 diastolic dysfunction, large pericardial effusion but no evidence of tamponade.  Assessment & Plan   1 large pericardial effusion-etiology unclear.  Patient did have bronchitis earlier this month.  Question viral related.  Also with history of mixed connective tissue disease.  C-reactive protein elevated at 8.5 and sed rate 88.  Continue colchicine .  Patient not on NSAID due to need for anticoagulation and ongoing microcytic anemia.  Patient had been previously scheduled for pericardiocentesis but remains anemic with hemoglobin 6.7.  She is also a Jehovah's Witness and would be unwilling to accept blood products.  I have reviewed  the patient with my interventional colleagues.  Patient would be extremely high risk for any procedure including pericardiocentesis; if she had any bleeding complications she would likely have a poor outcome.  We would like to avoid pericardiocentesis if possible. I will add a steroid taper with the hope that as we decrease inflammation that her pericardial effusion may improve.  Repeat limited echocardiogram today.  If unchanged and no tamponade potentially could discharge on steroid taper with outpatient follow-up echoes.  The above was discussed in detail with patient and her husband.    2 recent DVT-continue heparin  and resume apixaban  once it is clear she will not require procedures.  3 hypertension-patient's blood pressure is elevated; increase BiDil  to 2 tablets 3 times daily and follow.  4 acute diastolic congestive heart failure/pleural effusions-will decrease Lasix  to 20 mg daily.  5 acute kidney injury-follow renal function with diuresis.  For questions or updates, please contact [facility] Please consult [email or web address] for contact info under       Signed, Redell Shallow, MD  07/05/2024, 8:32 AM    "

## 2024-07-05 NOTE — Progress Notes (Addendum)
 " PROGRESS NOTE  MABLE DARA  FMW:[id number] DOB: Jul 01, 1960 DOA: 06/27/2024 PCP: Katina Pfeiffer, PA-C   Brief Narrative: Patient is a 64 year old female who is a [employer] with history of CVA on Plavix , CKD stage IIIa, hypertension, hyperlipidemia, hypothyroidism, mixed connective tissue disease on azathioprine , recent DVT of left femoral vein who presented with cough.  She was recently admitted for acute DVT, AKI and anemia.  Presented with increasing exertional shortness of breath, severe orthopnea.  Found to have active pulmonary edema.  Given IV Lasix .  Cardiology following.  Further workup showed large pericardial effusion.  Assessment & Plan:  Principal Problem:   Congestive heart failure (HCC) Active Problems:   Pulmonary edema cardiac cause (HCC)   Pericardial effusion   Anemia due to chronic kidney disease   Acute on chronic heart failure with preserved ejection fraction (HFpEF) (HCC)   Acute/subacute pulmonary edema/pericardial effusion: Suspected newly diagnosed CHF.  Initially given IV Lasix  but now changed to oral.  Continue to monitor daily weight, input/output.  Echo showed EF of 60 to 65%, large pericardial effusion but no tamponade.  Currently hemodynamically stable.  Cardiology following. History of recent  bronchitis, also has mixed connective tissue disease.  Elevated CRP, ESR.  Started on colchicine ,prednisone .  Repeat echocardiogram showed increasing size of pericardial effusion with early signs of tamponade.  Cardiology was considering pericardiocentesis but waiting for improvement in the hemoglobin. Follow-up echocardiogram today showed persistent large pericardial effusion.  Left lower extremity DVT: Initially diagnosed.  Eliquis  has been held and is currently on IV heparin .  Significant improvement in the lower extremity edema  AKI on CKD stage IIIa: Currently kidney function around baseline( 1.5).  Follows with nephrology  Chronic hypochromic  normocytic anemia/generalized weakness: Follows with hematology.  Hematology was consulted here.  Continue iron , folic acid  supplementation.  Hemoglobin in the range of 6.  Was given darbepoetin injection on January 2 and hematologist plan to continue weekly injection.  No evidence of GI bleed.  Patient CT scan done to rule out retroperitoneal hematoma was negative.  No indication for EGD as per GI , daily PPI to be continued.  If further drop in the hemoglobin, we can consider repeat CT  History of mixed currently tissue disorder: On chronic azathioprine   Obesity: BMI 31.8        DVT prophylaxis:Heparin  drip     Code Status: Full Code  Family Communication: Husband at bedside on 1/5  Patient status:Inpatient  Patient is from :home  Anticipated discharge un:ynfz  Estimated DC date: After complete workup   Consultants: Cardiology, hematology, GI  Procedures: None  Antimicrobials:  Anti-infectives (From admission, onward)    None       Subjective: Patient seen and examined at bedside today.  Hemodynamically stable.  Undergoing echocardiogram.  Denied any shortness of breath or cough or chest pain today..  Objective: Vitals:   07/04/24 0730 07/04/24 1515 07/04/24 2319 07/05/24 0720  BP: (!) 152/88 (!) 149/89 (!) 154/90 (!) 162/94  Pulse: 86 100 (!) 102 (!) 102  Resp: 18 18 18 17   Temp: 99.4 F (37.4 C)  99.8 F (37.7 C) 99.5 F (37.5 C)  TempSrc: Oral  Oral Oral  SpO2: 95% 94% 93% 92%  Weight:      Height:        Intake/Output Summary (Last 24 hours) at 07/05/2024 1230 Last data filed at 07/05/2024 0900 Gross per 24 hour  Intake 480 ml  Output 600 ml  Net -120 ml  Filed Weights   06/30/24 0822 07/02/24 0304 07/04/24 0448  Weight: 87.6 kg 88.4 kg 86.8 kg    Examination:   General exam: Overall comfortable, not in distress,obese HEENT: PERRL Respiratory system:  no wheezes or crackles  Cardiovascular system: S1 & S2 heard, RRR.  Gastrointestinal  system: Abdomen is nondistended, soft and nontender. Central nervous system: Alert and oriented Extremities: bilateral trace lower extremity edema, no clubbing ,no cyanosis Skin: No rashes, no ulcers,no icterus     Data Reviewed: I have personally reviewed following labs and imaging studies  CBC: Recent Labs  Lab 07/01/24 0210 07/02/24 0847 07/03/24 0237 07/04/24 0235 07/05/24 0233  WBC 7.6 5.9 6.4 5.5 5.0  HGB 6.9* 6.8* 6.3* 6.5* 6.7*  HCT 19.7* 19.5* 18.0* 18.6* 19.0*  MCV 73.8* 74.7* 73.2* 73.8* 75.1*  PLT 244 258 252 273 269   Basic Metabolic Panel: Recent Labs  Lab 06/29/24 0339 06/30/24 0422 07/01/24 0210 07/05/24 0233  NA 134* 135 132* 133*  K 3.4* 3.4* 3.9 3.6  CL 95* 96* 95* 96*  CO2 28 30 29 27   GLUCOSE 120* 94 94 87  BUN 44* 36* 31* 18  CREATININE 1.60* 1.42* 1.56* 1.55*  CALCIUM  8.8* 8.9 8.2* 8.3*  MG  --  1.9 1.7  --   PHOS  --  2.5 2.1*  --      Recent Results (from the past 240 hours)  Blood culture (routine x 2)     Status: None   Collection Time: 06/27/24  3:06 PM   Specimen: BLOOD  Result Value Ref Range Status   Specimen Description   Final    BLOOD LEFT ANTECUBITAL Performed at [facility], [address] [place], [place] [address]    Special Requests   Final    Blood Culture results may not be optimal due to an inadequate volume of blood received in culture bottles BOTTLES DRAWN AEROBIC AND ANAEROBIC Performed at [facility], [address], [place], [place] [address]    Culture   Final    NO GROWTH 5 DAYS Performed at [facility], [address], [place], [place] [address]    Report Status 07/02/2024 FINAL  Final     Radiology Studies: ECHOCARDIOGRAM LIMITED Result Date: 07/05/2024    ECHOCARDIOGRAM LIMITED REPORT   Patient Name:   PARILEE HALLY Date of Exam: 07/05/2024 Medical Rec #:  [id number]       Height:       65.0 in Accession #:    [id number]      Weight:       191.3 lb Date  of Birth:  1960/12/31       BSA:          1.941 m Patient Age:    63 years        BP:           162/94 mmHg Patient Gender: F               HR:           101 bpm. Exam Location:  Inpatient Procedure: Limited Echo, Cardiac Doppler and Color Doppler (Both Spectral and            Color Flow Doppler were utilized during procedure). Indications:    I31.3 Pericardial effusion (noninflammatory)  History:        Patient has prior history of Echocardiogram examinations, most  recent 07/01/2024. CHF; Stroke. Pulmonary edema. Pericardial                 effision.  Sonographer:    Ellouise Mose RDCS Referring Phys: [id number] BRIAN S CRENSHAW IMPRESSIONS  1. Left ventricular ejection fraction, by estimation, is 60 to 65%. The left ventricle has normal function. There is moderate asymmetric left ventricular hypertrophy of the septal segment.  2. No right ventricular collapse. Right ventricular systolic function is normal. The right ventricular size is normal. There is normal pulmonary artery systolic pressure.  3. Large pericardial effusion. The pericardial effusion is circumferential. Large pleural effusion in the left lateral region.  4. The aortic valve is tricuspid. There is mild calcification of the aortic valve. Aortic valve sclerosis is present, with no evidence of aortic valve stenosis.  5. The inferior vena cava is dilated in size with >50% respiratory variability, suggesting right atrial pressure of 8 mmHg. Comparison(s): Prior images reviewed side by side. Pericardial effusion is larger. Improvement in respirophasic variation across the mitral valve; persistent variation across the tricuspid valve. No evidence of ventricular or atrial collapse on either study. New tachycardia present on this study, but without IVC plethora. Stable pleural effusion. FINDINGS  Left Ventricle: Left ventricular ejection fraction, by estimation, is 60 to 65%. The left ventricle has normal function. There is moderate asymmetric left  ventricular hypertrophy of the septal segment. Right Ventricle: No right ventricular collapse. The right ventricular size is normal. No increase in right ventricular wall thickness. Right ventricular systolic function is normal. There is normal pulmonary artery systolic pressure. The tricuspid regurgitant velocity is 2.15 m/s, and with an assumed right atrial pressure of 8 mmHg, the estimated right ventricular systolic pressure is 26.5 mmHg. Pericardium: A large pericardial effusion is present. The pericardial effusion is circumferential. Tricuspid Valve: The tricuspid valve is normal in structure. Tricuspid valve regurgitation is not demonstrated. No evidence of tricuspid stenosis. Aortic Valve: The aortic valve is tricuspid. There is mild calcification of the aortic valve. Aortic valve sclerosis is present, with no evidence of aortic valve stenosis. Venous: The inferior vena cava is dilated in size with greater than 50% respiratory variability, suggesting right atrial pressure of 8 mmHg. Additional Comments: There is a large pleural effusion in the left lateral region. Spectral Doppler performed. Color Doppler performed.  LEFT VENTRICLE PLAX 2D LVIDd:         3.20 cm LVIDs:         2.30 cm LV PW:         1.20 cm LV IVS:        1.40 cm  LV Volumes (MOD) LV vol d, MOD A2C: 58.7 ml LV vol d, MOD A4C: 55.8 ml LV vol s, MOD A2C: 20.6 ml LV vol s, MOD A4C: 24.6 ml LV SV MOD A2C:     38.1 ml LV SV MOD A4C:     55.8 ml LV SV MOD BP:      34.9 ml IVC IVC diam: 2.10 cm  AORTA Ao Asc diam: 3.20 cm TRICUSPID VALVE TR Peak grad:   18.5 mmHg TR Vmax:        215.00 cm/s Stanly Leavens MD Electronically signed by Stanly Leavens MD Signature Date/Time: 07/05/2024/10:40:36 AM    Final     Scheduled Meds:  azaTHIOprine   100 mg Oral Daily   carvedilol   6.25 mg Oral BID WC   colchicine   0.6 mg Oral BID   Darbepoetin Alfa   100 mcg Subcutaneous Q7 days  ferrous sulfate   325 mg Oral TID WC   fluticasone   furoate-vilanterol  1 puff Inhalation Daily   folic acid   1 mg Oral Daily   [START ON 07/06/2024] furosemide   20 mg Oral Daily   isosorbide -hydrALAZINE   2 tablet Oral TID   levothyroxine   112 mcg Oral q AM   pantoprazole   40 mg Oral BID   predniSONE   20 mg Oral Q breakfast   senna  1 tablet Oral BID   Continuous Infusions:  heparin  1,450 Units/hr (07/05/24 1209)     LOS: 7 days   Ivonne Mustache, MD [facility] P1/11/2024, 12:30 PM  "

## 2024-07-05 NOTE — Plan of Care (Signed)
  Problem: Education: Goal: Knowledge of General Education information will improve Description: Including pain rating scale, medication(s)/side effects and non-pharmacologic comfort measures Outcome: Progressing   Problem: Elimination: Goal: Will not experience complications related to bowel motility Outcome: Progressing   Problem: Skin Integrity: Goal: Risk for impaired skin integrity will decrease Outcome: Progressing   

## 2024-07-05 NOTE — Progress Notes (Addendum)
 PHARMACY - ANTICOAGULATION CONSULT NOTE  Pharmacy Consult for heparin  Indication: DVT  Allergies[1]  Patient Measurements: Height: 5' 5 (165.1 cm) Weight: 86.8 kg (191 lb 4.8 oz) IBW/kg (Calculated) : 57 HEPARIN  DW (KG): 76.4  Vital Signs: Temp: 99.8 F (37.7 C) (01/05 2319) Temp Source: Oral (01/05 2319) BP: 154/90 (01/05 2319) Pulse Rate: 102 (01/05 2319)  Labs: Recent Labs    07/03/24 0237 07/04/24 0235 07/05/24 0233  HGB 6.3* 6.5* 6.7*  HCT 18.0* 18.6* 19.0*  PLT 252 273 269  APTT 89* 84* 86*  LABPROT  --  15.5*  --   INR  --  1.2  --   HEPARINUNFRC 0.81* 0.47 0.35  CREATININE  --   --  1.55*    Estimated Creatinine Clearance: 41.5 mL/min (A) (by C-G formula based on SCr of 1.55 mg/dL (H)).   Assessment: Evelyn Baker is a 61 YOF on Eliquis  PTA for recent DVT in left femoral vein detected on 06/03/24. Eliquis  continued this admission. Patient also noted to have a pericardial effusion on TTE, noted to be larger on 07/01/24. Hgb also low to 6.9 mg/dL today, patient noted to be Jehovah witness which is restricting blood transfusion per MD. Plan to transition patient from Eliquis  to heparin  today. Pharmacy consulted for heparin  dosing. Last dose of Eliquis  administered on 06/30/24 at 2100.  Heparin  level 0.35 is therapeutic on 1400 units/hr. No bleeding reported. Hgb low stable 6s, platelets are normal.  Goal of Therapy:  Heparin  level 0.3-0.7 units/ml Monitor platelets by anticoagulation protocol: Yes   Plan:  Continue heparin  infusion at 1450 units/hr  Daily heparin  level and CBC Monitor for signs/symptoms of bleeding Pending consideration for pericardiocentesis   Thank you for involving pharmacy in this patient's care.  Rankin Sams, PharmD, BCPS, BCCCP Clinical Pharmacist       [1]  Allergies Allergen Reactions   Shellfish Allergy Anaphylaxis and Swelling   Avocado Swelling    Lips   Codeine  Other (See Comments)    Constipation   Iodinated Contrast  Media Other (See Comments)   Mushroom Other (See Comments)    Itchy throat   Pineapple Swelling    Lip swelling    Penicillins Rash    Reaction: Childhood

## 2024-07-05 NOTE — Progress Notes (Signed)
" °  Echocardiogram 2D Echocardiogram has been performed.  Evelyn Baker 07/05/2024, 10:27 AM "

## 2024-07-06 ENCOUNTER — Telehealth: Payer: Self-pay | Admitting: Cardiology

## 2024-07-06 ENCOUNTER — Telehealth: Payer: Self-pay | Admitting: Oncology

## 2024-07-06 ENCOUNTER — Telehealth: Payer: Self-pay

## 2024-07-06 ENCOUNTER — Other Ambulatory Visit: Payer: Self-pay | Admitting: Cardiology

## 2024-07-06 ENCOUNTER — Other Ambulatory Visit (HOSPITAL_COMMUNITY): Payer: Self-pay

## 2024-07-06 ENCOUNTER — Encounter: Payer: Self-pay | Admitting: Rheumatology

## 2024-07-06 DIAGNOSIS — I3139 Other pericardial effusion (noninflammatory): Secondary | ICD-10-CM

## 2024-07-06 DIAGNOSIS — I509 Heart failure, unspecified: Secondary | ICD-10-CM | POA: Diagnosis not present

## 2024-07-06 LAB — CBC
HCT: 18.5 % — ABNORMAL LOW (ref 36.0–46.0)
Hemoglobin: 6.4 g/dL — CL (ref 12.0–15.0)
MCH: 25.6 pg — ABNORMAL LOW (ref 26.0–34.0)
MCHC: 34.6 g/dL (ref 30.0–36.0)
MCV: 74 fL — ABNORMAL LOW (ref 80.0–100.0)
Platelets: 265 K/uL (ref 150–400)
RBC: 2.5 MIL/uL — ABNORMAL LOW (ref 3.87–5.11)
RDW: 20.1 % — ABNORMAL HIGH (ref 11.5–15.5)
WBC: 5.4 K/uL (ref 4.0–10.5)
nRBC: 0 % (ref 0.0–0.2)

## 2024-07-06 LAB — HEPARIN LEVEL (UNFRACTIONATED): Heparin Unfractionated: 0.48 [IU]/mL (ref 0.30–0.70)

## 2024-07-06 MED ORDER — ISOSORB DINITRATE-HYDRALAZINE 20-37.5 MG PO TABS: 2.0000 | ORAL_TABLET | Freq: Three times a day (TID) | ORAL | Status: DC

## 2024-07-06 MED ORDER — COLCHICINE 0.6 MG PO TABS
0.6000 mg | ORAL_TABLET | Freq: Two times a day (BID) | ORAL | 0 refills | Status: DC
  Filled 2024-07-06: qty 60, 30d supply, fill #0

## 2024-07-06 MED ORDER — PREDNISONE 10 MG PO TABS
ORAL_TABLET | ORAL | 0 refills | Status: AC
  Filled 2024-07-06: qty 110, 34d supply, fill #0

## 2024-07-06 MED ORDER — FUROSEMIDE 20 MG PO TABS
20.0000 mg | ORAL_TABLET | Freq: Every day | ORAL | 0 refills | Status: DC
  Filled 2024-07-06: qty 30, 30d supply, fill #0

## 2024-07-06 MED ORDER — PREDNISONE 20 MG PO TABS: 40.0000 mg | ORAL_TABLET | Freq: Every day | ORAL | Status: DC

## 2024-07-06 MED ORDER — APIXABAN 5 MG PO TABS
5.0000 mg | ORAL_TABLET | Freq: Two times a day (BID) | ORAL | Status: DC
  Administered 2024-07-06: 5 mg via ORAL
  Filled 2024-07-06: qty 1

## 2024-07-06 NOTE — Telephone Encounter (Signed)
 I received a phone call from Dr. Pietro.   According to Dr. Pietro, Evelyn Baker is hospitalized and has pericardial effusion and pleural effusion.  He stated that she is Jehovah's Witness and despite having low hemoglobin of 6 she cannot be transfused.  He was hesitant to do pericardial tap as there is a risk of bleeding.Patient is on Eliquis .  He discussed starting her on prednisone  and colchicine .    We agreed with starting patient on prednisone  40 mg and then taper the dose.  Patient continues to be on Imuran .

## 2024-07-06 NOTE — Plan of Care (Signed)

## 2024-07-06 NOTE — Progress Notes (Signed)
 Ordering limited echo for pericardial effusion

## 2024-07-06 NOTE — Progress Notes (Addendum)
 PHARMACY - ANTICOAGULATION CONSULT NOTE  Pharmacy Consult for heparin  Indication: DVT  Allergies[1]  Patient Measurements: Height: 5' 5 (165.1 cm) Weight: 86.8 kg (191 lb 4.8 oz) IBW/kg (Calculated) : 57 HEPARIN  DW (KG): 76.4  Vital Signs: Temp: 98.6 F (37 C) (01/07 0427) Temp Source: Oral (01/07 0427) BP: 147/91 (01/07 0427) Pulse Rate: 90 (01/07 0427)  Labs: Recent Labs    07/04/24 0235 07/05/24 0233 07/06/24 0259  HGB 6.5* 6.7* 6.4*  HCT 18.6* 19.0* 18.5*  PLT 273 269 265  APTT 84* 86*  --   LABPROT 15.5*  --   --   INR 1.2  --   --   HEPARINUNFRC 0.47 0.35 0.48  CREATININE  --  1.55*  --     Estimated Creatinine Clearance: 41.5 mL/min (A) (by C-G formula based on SCr of 1.55 mg/dL (H)).   Assessment: Evelyn Baker is a 16 YOF on Eliquis  PTA for recent DVT in left femoral vein detected on 06/03/24. Eliquis  continued this admission. Patient also noted to have a pericardial effusion on TTE, noted to be larger on 07/01/24- patient noted to be Jehovah witness which is restricting blood transfusion per MD. Pharmacy consulted for heparin  dosing. Last dose of Eliquis  administered on 06/30/24 at 2100.  Heparin  level is therapeutic at 0.48, on 1450 units/hr. Hgb remains low at 6.4 but stable, plt WNL at 265. No s/sx of bleeding or infusion issues - ECHO 1/6 persistent pericardial effusion.  Goal of Therapy:  Heparin  level 0.3-0.7 units/ml Monitor platelets by anticoagulation protocol: Yes   Plan:  Continue heparin  infusion at 1450 units/hr  Daily heparin  level and CBC Monitor for signs/symptoms of bleeding Team trying to avoid pericardiocentesis if possible  Thank you for allowing pharmacy to participate in this patient's care,  Suzen Sour, PharmD, BCCCP Clinical Pharmacist  Phone: [phone] 07/06/2024 7:28 AM  Please check AMION for all [facility] Pharmacy phone numbers After 10:00 PM, call Main Pharmacy [phone]  ADDENDUM  Plan to discharge - okay with teams to restart  apixaban  at discharge given recent DVT and close monitoring of pericardial effusion. Will stop heparin  infusion and restart apixaban  5 mg BID.   Thank you for allowing pharmacy to participate in this patient's care,  Suzen Sour, PharmD, BCCCP Clinical Pharmacist     [1]  Allergies Allergen Reactions   Shellfish Allergy Anaphylaxis and Swelling   Avocado Swelling    Lips   Codeine  Other (See Comments)    Constipation   Iodinated Contrast Media Other (See Comments)   Mushroom Other (See Comments)    Itchy throat   Pineapple Swelling    Lip swelling    Penicillins Rash    Reaction: Childhood

## 2024-07-06 NOTE — Progress Notes (Signed)
 Discharge instructions provided by Discharge RN. Discharged by volunteer at 1243 on 07/06/24 via wheelchair with all belongings.

## 2024-07-06 NOTE — Discharge Summary (Signed)
 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 She was recently admitted for acute DVT, AKI and anemia. Presented with increasing exertional shortness of breath, severe orthopnea. Found to have active pulmonary edema. Given IV Lasix .  Cardiology consulted and following.  Workup showed large pericardial effusion.  Plan for pericardiocentesis but could not be done due to persistent low hemoglobin.  She is currently asymptomatic.  Cardiology wants to follow-up as soon as possible as an outpatient for follow-up echocardiogram.  Current plan is to continue prednisone  and colchicine .  Medically stable for discharge home today.  Following problems were addressed during the hospitalization:   Acute/subacute pulmonary edema/pericardial effusion: Suspected newly diagnosed CHF.  Initially given IV Lasix  but now changed to oral.  Continue to monitor daily weight, input/output.  Echo showed EF of 60 to 65%, large pericardial effusion but no tamponade.  Currently hemodynamically stable.   History of recent  bronchitis, also has mixed connective tissue disease.  Elevated CRP, ESR.  Started on colchicine ,prednisone .  Repeat echocardiogram showed increasing size of pericardial effusion with early signs of tamponade.  Cardiology was considering pericardiocentesis but waiting for improvement in the hemoglobin.  This should be considered as an outpatient. Patient does not complain of chest pain and  she is not dyspneic at rest.  Current plan is to continue prednisone  in a tapering fashion every 2 weeks and colchicine .  She will follow-up with cardiology as an outpatient for follow-up echocardiogram   Left lower extremity DVT: Continue Eliquis    AKI on CKD stage IIIa: Currently kidney function around baseline( 1.5).  Follows with nephrology   Chronic hypochromic normocytic anemia/generalized weakness: Follows with hematology.  Hematology was consulted here.  Continue iron , folic acid  supplementation.  Hemoglobin in the range of 6.  Was given darbepoetin injection on January 2 and hematologist plan to continue weekly injection.  No evidence of GI bleed.  CT scan done to rule out retroperitoneal hematoma was negative.  No indication for EGD as per GI ,  PPI to be continued.   History of mixed currently tissue disorder: On chronic azathioprine .  Follows with rheumatology   Obesity: BMI 31.8  Discharge Diagnoses:  Principal Problem:   Congestive heart failure (HCC) Active Problems:   Pulmonary edema cardiac cause (HCC)   Pericardial effusion   Anemia due to chronic kidney disease   Acute on chronic heart failure with preserved ejection fraction (HFpEF) [facility])    Discharge Instructions  Discharge Instructions     Discharge instructions   Complete by: As directed    1)Please take your medications as instructed 2)You will be called by cardiology for follow-up appointment 3)Follow up with your PCP and hematologist as soon as possible.  Do a CBC and BMP tests in 5 days 4)Follow up with your  rheumatologist   Increase activity slowly   Complete by: As directed       Allergies as of 07/06/2024       Reactions   Shellfish Allergy Anaphylaxis, Swelling   Avocado Swelling   Lips   Codeine  Other (See Comments)  Constipation   Iodinated Contrast Media Other (See Comments)   Mushroom Other (See Comments)   Itchy throat   Pineapple Swelling   Lip swelling    Penicillins Rash    Reaction: Childhood        Medication List     STOP taking these medications    amLODipine  10 MG tablet Commonly known as: NORVASC        TAKE these medications    Acetaminophen  Extra Strength 500 MG Tabs Take 2 tablets (1,000 mg total) by mouth every 8 (eight) hours as needed.   albuterol  108 (90 Base) MCG/ACT inhaler Commonly known as: VENTOLIN  HFA Inhale 2 puffs into the lungs every 4 (four) hours as needed for wheezing or shortness of breath.   azathioprine  100 MG tablet Commonly known as: IMURAN  Take 1 tablet (100 mg total) by mouth daily as directed   B-12 1000 MCG Tabs Take 1 tablet (1,000 mcg total) by mouth daily.   carvedilol  6.25 MG tablet Commonly known as: COREG  Take 1 tablet (6.25 mg total) by mouth 2 (two) times daily with a meal.   colchicine  0.6 MG tablet Take 1 tablet (0.6 mg total) by mouth 2 (two) times daily.   Eliquis  5 MG Tabs tablet Generic drug: apixaban  Take 1 tablet (5 mg total) by mouth 2 (two) times daily.   EPINEPHrine  0.3 mg/0.3 mL Soaj injection Commonly known as: EPI-PEN Inject 0.3 mg into the muscle as needed as directed.   FeroSul 325 (65 Fe) MG tablet Generic drug: ferrous sulfate  Take 1 tablet (325 mg total) by mouth 3 (three) times daily with meals.   folic acid  1 MG tablet Commonly known as: FOLVITE  Take 1 tablet (1 mg total) by mouth daily.   furosemide  20 MG tablet Commonly known as: LASIX  Take 1 tablet (20 mg total) by mouth daily. Start taking on: July 07, 2024   hydrALAZINE  25 MG tablet Commonly known as: APRESOLINE  Take 1 tablet (25 mg) once daily as needed for systolic blood pressure > 170 What changed: when to take this   isosorbide -hydrALAZINE  20-37.5 MG tablet Commonly known as: BIDIL  Take 2 tablets by mouth 3 (three) times daily. What changed: how much to take   levothyroxine  112 MCG tablet Commonly known as: SYNTHROID  Take 1 tablet (112 mcg total) by mouth in the morning on an empty stomach.    ondansetron  4 MG tablet Commonly known as: ZOFRAN  Take 1 tablet (4 mg total) by mouth every 6 (six) hours as needed for nausea.   pantoprazole  40 MG tablet Commonly known as: PROTONIX  Take 1 tablet (40 mg total) by mouth 2 (two) times daily.   potassium chloride  10 MEQ CR capsule Commonly known as: MICRO-K  Take 1 capsule (10 mEq total) by mouth 2 (two) times daily.   predniSONE  10 MG tablet Commonly known as: DELTASONE  Take 4 tablets (40 mg total) by mouth daily with breakfast. Take 4 pills daily for 2 weeks then 3 pills daily for 2 weeks then 2 pills daily for 2 weeks then 1 pill daily for 2 weeks then stop Start taking on: July 07, 2024   rosuvastatin  10 MG tablet Commonly known as: CRESTOR  Take 1 tablet (10 mg total) by mouth daily.   senna-docusate 8.6-50 MG tablet Commonly known as: Senokot-S Take 1 tablet by mouth at bedtime as needed for mild constipation.   Symbicort  160-4.5 MCG/ACT inhaler Generic drug: budesonide -formoterol  Inhale 2 puffs into the lungs 2 (two) times daily.   vitamin C 100 MG tablet  Take 100 mg by mouth daily.        Follow-up Information     Katina Pfeiffer, PA-C Follow up on 07/13/2024.   Specialty: Family Medicine Why: 11:30 for hospital follow up Contact information: [address] [place] [address] [phone]                Allergies[1]  Consultations: Cardiology, hematology   Procedures/Studies: ECHOCARDIOGRAM LIMITED Result Date: 07/05/2024    ECHOCARDIOGRAM LIMITED REPORT   Patient Name:   Evelyn Baker Date of Exam: 07/05/2024 Medical Rec #:  [id number]       Height:       65.0 in Accession #:    [id number]      Weight:       191.3 lb Date of Birth:  May 21, 1961       BSA:          1.941 m Patient Age:    63 years        BP:           162/94 mmHg Patient Gender: F               HR:           101 bpm. Exam Location:  Inpatient Procedure: Limited Echo, Cardiac Doppler and Color Doppler (Both Spectral and             Color Flow Doppler were utilized during procedure). Indications:    I31.3 Pericardial effusion (noninflammatory)  History:        Patient has prior history of Echocardiogram examinations, most                 recent 07/01/2024. CHF; Stroke. Pulmonary edema. Pericardial                 effision.  Sonographer:    Ellouise Mose RDCS Referring Phys: [id number] BRIAN S CRENSHAW IMPRESSIONS  1. Left ventricular ejection fraction, by estimation, is 60 to 65%. The left ventricle has normal function. There is moderate asymmetric left ventricular hypertrophy of the septal segment.  2. No right ventricular collapse. Right ventricular systolic function is normal. The right ventricular size is normal. There is normal pulmonary artery systolic pressure.  3. Large pericardial effusion. The pericardial effusion is circumferential. Large pleural effusion in the left lateral region.  4. The aortic valve is tricuspid. There is mild calcification of the aortic valve. Aortic valve sclerosis is present, with no evidence of aortic valve stenosis.  5. The inferior vena cava is dilated in size with >50% respiratory variability, suggesting right atrial pressure of 8 mmHg. Comparison(s): Prior images reviewed side by side. Pericardial effusion is larger. Improvement in respirophasic variation across the mitral valve; persistent variation across the tricuspid valve. No evidence of ventricular or atrial collapse on either study. New tachycardia present on this study, but without IVC plethora. Stable pleural effusion. FINDINGS  Left Ventricle: Left ventricular ejection fraction, by estimation, is 60 to 65%. The left ventricle has normal function. There is moderate asymmetric left ventricular hypertrophy of the septal segment. Right Ventricle: No right ventricular collapse. The right ventricular size is normal. No increase in right ventricular wall thickness. Right ventricular systolic function is normal. There is normal pulmonary artery systolic  pressure. The tricuspid regurgitant velocity is 2.15 m/s, and with an assumed right atrial pressure of 8 mmHg, the estimated right ventricular systolic pressure is 26.5 mmHg. Pericardium: A large pericardial effusion is present. The pericardial effusion is circumferential. Tricuspid Valve:  The tricuspid valve is normal in structure. Tricuspid valve regurgitation is not demonstrated. No evidence of tricuspid stenosis. Aortic Valve: The aortic valve is tricuspid. There is mild calcification of the aortic valve. Aortic valve sclerosis is present, with no evidence of aortic valve stenosis. Venous: The inferior vena cava is dilated in size with greater than 50% respiratory variability, suggesting right atrial pressure of 8 mmHg. Additional Comments: There is a large pleural effusion in the left lateral region. Spectral Doppler performed. Color Doppler performed.  LEFT VENTRICLE PLAX 2D LVIDd:         3.20 cm LVIDs:         2.30 cm LV PW:         1.20 cm LV IVS:        1.40 cm  LV Volumes (MOD) LV vol d, MOD A2C: 58.7 ml LV vol d, MOD A4C: 55.8 ml LV vol s, MOD A2C: 20.6 ml LV vol s, MOD A4C: 24.6 ml LV SV MOD A2C:     38.1 ml LV SV MOD A4C:     55.8 ml LV SV MOD BP:      34.9 ml IVC IVC diam: 2.10 cm  AORTA Ao Asc diam: 3.20 cm TRICUSPID VALVE TR Peak grad:   18.5 mmHg TR Vmax:        215.00 cm/s Stanly Leavens MD Electronically signed by Stanly Leavens MD Signature Date/Time: 07/05/2024/10:40:36 AM    Final    ECHOCARDIOGRAM LIMITED Result Date: 07/01/2024    ECHOCARDIOGRAM LIMITED REPORT   Patient Name:   Evelyn Baker Date of Exam: 07/01/2024 Medical Rec #:  [id number]       Height:       65.0 in Accession #:    [id number]      Weight:       193.2 lb Date of Birth:  1960-10-19       BSA:          1.949 m Patient Age:    63 years        BP:           129/78 mmHg Patient Gender: F               HR:           87 bpm. Exam Location:  Inpatient Procedure: Limited Echo, Cardiac Doppler and Color Doppler (Both  Spectral and            Color Flow Doppler were utilized during procedure). Indications:    Pericardial effusion I31.3  History:        Patient has prior history of Echocardiogram examinations, most                 recent 06/29/2024. Risk Factors:Hypertension.  Sonographer:    Jayson Gaskins Referring Phys: [id number] ARUN K THUKKANI IMPRESSIONS  1. Left ventricular ejection fraction, by estimation, is 60 to 65%. The left ventricle has normal function. The left ventricle has no regional wall motion abnormalities.  2. Right ventricular systolic function is normal. The right ventricular size is normal.  3. No evidence of RV diastolic collapse but there is clinically significant respiratory variation across the mitral and tricuspid valves which has worsened since prevoius echo. Additionally effusion is now larger. Discussed with interventional and exam and echo not consistent with overt cardiac tamponade. Would repeat echo in the next 48-72 hours or clinical change. Large pericardial effusion.  4. The mitral valve is normal in structure. No evidence of mitral  valve regurgitation. FINDINGS  Left Ventricle: Left ventricular ejection fraction, by estimation, is 60 to 65%. The left ventricle has normal function. The left ventricle has no regional wall motion abnormalities. The left ventricular internal cavity size was normal in size. There is  no left ventricular hypertrophy. Right Ventricle: The right ventricular size is normal. Right vetricular wall thickness was not well visualized. Right ventricular systolic function is normal. Pericardium: No evidence of RV diastolic collapse but there is clinically significant respiratory variation across the mitral and tricuspid valves which has worsened since prevoius echo. Additionally effusion is now larger. Discussed with interventional and exam and echo not consistent with overt cardiac tamponade. Would repeat echo in the next 48-72 hours or clinical change. A large pericardial  effusion is present. Mitral Valve: The mitral valve is normal in structure. Tricuspid Valve: The tricuspid valve is normal in structure. Tricuspid valve regurgitation is not demonstrated. LEFT VENTRICLE PLAX 2D LVIDd:         4.10 cm LVIDs:         2.90 cm LV PW:         1.30 cm LV IVS:        1.00 cm LVOT diam:     1.85 cm LVOT Area:     2.69 cm  LEFT ATRIUM             Index LA Vol (A2C):   35.8 ml 18.37 ml/m LA Vol (A4C):   36.8 ml 18.88 ml/m LA Biplane Vol: 36.6 ml 18.78 ml/m   SHUNTS Systemic Diam: 1.85 cm Morene Brownie Electronically signed by Morene Brownie Signature Date/Time: 07/01/2024/11:03:15 AM    Final    ECHOCARDIOGRAM COMPLETE Result Date: 06/29/2024    ECHOCARDIOGRAM REPORT   Patient Name:   Evelyn Baker Date of Exam: 06/29/2024 Medical Rec #:  [id number]       Height:       65.0 in Accession #:    [id number]      Weight:       193.1 lb Date of Birth:  Sep 03, 1960       BSA:          1.949 m Patient Age:    63 years        BP:           136/80 mmHg Patient Gender: F               HR:           80 bpm. Exam Location:  Inpatient Procedure: 2D Echo, Cardiac Doppler and Color Doppler (Both Spectral and Color            Flow Doppler were utilized during procedure). Indications:    CHF - Acute Diastolic  History:        Patient has prior history of Echocardiogram examinations, most                 recent 03/17/2023. CHF, CAD, Signs/Symptoms:Edema, Shortness of                 Breath and Dyspnea; Risk Factors:Hypertension and Dyslipidemia.  Sonographer:    Juliene Rucks Referring Phys: [id number] JEFFREY T [facility]  Sonographer Comments: Patient is obese. IMPRESSIONS  1. Large pericardial effusion; IVC not dilated and no RV diastolic collapse to suggest tamponade.  2. Left ventricular ejection fraction, by estimation, is 60 to 65%. The left ventricle has normal function. The left ventricle has no regional wall motion abnormalities. There is mild  left ventricular hypertrophy. Left ventricular diastolic  parameters are consistent with Grade I diastolic dysfunction (impaired relaxation).  3. Right ventricular systolic function is normal. The right ventricular size is normal.  4. Large pericardial effusion. There is no evidence of cardiac tamponade.  5. The mitral valve is normal in structure. Trivial mitral valve regurgitation. No evidence of mitral stenosis.  6. The aortic valve is tricuspid. Aortic valve regurgitation is not visualized. No aortic stenosis is present.  7. The inferior vena cava is normal in size with greater than 50% respiratory variability, suggesting right atrial pressure of 3 mmHg. FINDINGS  Left Ventricle: Left ventricular ejection fraction, by estimation, is 60 to 65%. The left ventricle has normal function. The left ventricle has no regional wall motion abnormalities. The left ventricular internal cavity size was normal in size. There is  mild left ventricular hypertrophy. Left ventricular diastolic parameters are consistent with Grade I diastolic dysfunction (impaired relaxation). Right Ventricle: The right ventricular size is normal. Right ventricular systolic function is normal. Left Atrium: Left atrial size was normal in size. Right Atrium: Right atrial size was normal in size. Pericardium: A large pericardial effusion is present. There is no evidence of cardiac tamponade. Mitral Valve: The mitral valve is normal in structure. Trivial mitral valve regurgitation. No evidence of mitral valve stenosis. Tricuspid Valve: The tricuspid valve is normal in structure. Tricuspid valve regurgitation is trivial. No evidence of tricuspid stenosis. Aortic Valve: The aortic valve is tricuspid. Aortic valve regurgitation is not visualized. No aortic stenosis is present. Pulmonic Valve: The pulmonic valve was normal in structure. Pulmonic valve regurgitation is trivial. No evidence of pulmonic stenosis. Aorta: The aortic root is normal in size and structure. Venous: The inferior vena cava is normal in size  with greater than 50% respiratory variability, suggesting right atrial pressure of 3 mmHg. IAS/Shunts: No atrial level shunt detected by color flow Doppler. Additional Comments: Large pericardial effusion; IVC not dilated and no RV diastolic collapse to suggest tamponade.  LEFT VENTRICLE PLAX 2D LVIDd:         4.30 cm      Diastology LVIDs:         2.70 cm      LV e' medial:    6.85 cm/s LV PW:         1.20 cm      LV E/e' medial:  16.1 LV IVS:        1.10 cm      LV e' lateral:   6.64 cm/s LVOT diam:     1.90 cm      LV E/e' lateral: 16.6 LV SV:         59 LV SV Index:   30 LVOT Area:     2.84 cm  LV Volumes (MOD) LV vol d, MOD A2C: 128.0 ml LV vol d, MOD A4C: 106.0 ml LV vol s, MOD A2C: 61.3 ml LV vol s, MOD A4C: 45.8 ml LV SV MOD A2C:     66.7 ml LV SV MOD A4C:     106.0 ml LV SV MOD BP:      64.9 ml RIGHT VENTRICLE             IVC RV Basal diam:  2.90 cm     IVC diam: 2.00 cm RV Mid diam:    2.20 cm RV S prime:     13.40 cm/s TAPSE (M-mode): 1.8 cm LEFT ATRIUM  Index        RIGHT ATRIUM           Index LA diam:        2.80 cm 1.44 cm/m   RA Area:     15.80 cm LA Vol (A2C):   47.6 ml 24.42 ml/m  RA Volume:   41.60 ml  21.34 ml/m LA Vol (A4C):   24.2 ml 12.42 ml/m LA Biplane Vol: 35.2 ml 18.06 ml/m  AORTIC VALVE LVOT Vmax:   107.00 cm/s LVOT Vmean:  72.100 cm/s LVOT VTI:    0.207 m  AORTA Ao Root diam: 2.90 cm Ao Asc diam:  2.80 cm MITRAL VALVE                TRICUSPID VALVE MV Area (PHT): 3.72 cm     TR Peak grad:   23.2 mmHg MV Decel Time: 204 msec     TR Vmax:        241.00 cm/s MV E velocity: 110.00 cm/s MV A velocity: 107.00 cm/s  SHUNTS MV E/A ratio:  1.03         Systemic VTI:  0.21 m                             Systemic Diam: 1.90 cm Redell Shallow MD Electronically signed by Redell Shallow MD Signature Date/Time: 06/29/2024/9:10:21 AM    Final    DG Chest 2 View Result Date: 06/27/2024 EXAM: 2 VIEW(S) XRAY OF THE CHEST 06/27/2024 03:49:00 PM COMPARISON: 06/05/2024 CLINICAL HISTORY:  chest pain FINDINGS: LUNGS AND PLEURA: Mild pulmonary edema. New bilateral small pleural effusions with associated atelectasis. No pneumothorax. HEART AND MEDIASTINUM: Mild cardiomegaly. BONES AND SOFT TISSUES: No acute osseous abnormality. IMPRESSION: 1. Mild pulmonary edema with new bilateral small pleural effusions and associated atelectasis. 2. Mild cardiomegaly. Electronically signed by: Greig Pique MD 06/27/2024 06:05 PM EST RP Workstation: [id number]   CT ABDOMEN PELVIS WO CONTRAST Result Date: 06/10/2024 CLINICAL DATA:  Anemia.  Concern for retroperitoneal hematoma. EXAM: CT ABDOMEN AND PELVIS WITHOUT CONTRAST TECHNIQUE: Multidetector CT imaging of the abdomen and pelvis was performed following the standard protocol without IV contrast. RADIATION DOSE REDUCTION: This exam was performed according to the departmental dose-optimization program which includes automated exposure control, adjustment of the mA and/or kV according to patient size and/or use of iterative reconstruction technique. COMPARISON:  CT abdomen and pelvis dated 03/20/2024 FINDINGS: Lower chest: Bilateral lower lobe relaxation atelectasis. Trace right and small left pleural effusions. Partially imaged heart size is normal. Trace pericardial effusion. Hepatobiliary: No focal hepatic lesions. No intra or extrahepatic biliary ductal dilation. Normal gallbladder. Pancreas: No focal lesions or main ductal dilation. Spleen: Normal in size without focal abnormality. Adrenals/Urinary Tract: No adrenal nodules. No suspicious renal mass on this noncontrast enhanced examination, calculi or hydronephrosis. No focal bladder wall thickening. Stomach/Bowel: Small hiatal hernia. Normal appearance of the stomach. No evidence of bowel wall thickening, distention, or inflammatory changes. Small and large bowel are diffusely underdistended. Normal appendix. Vascular/Lymphatic: Aortic atherosclerosis. Superficial varicose veins anterior to the pubis. No  enlarged abdominal or pelvic lymph nodes. Reproductive: No adnexal masses. Other: Trace presacral free fluid.  No free air or fluid collection. Musculoskeletal: No acute or abnormal lytic or blastic osseous lesions. Diffuse body wall edema. IMPRESSION: 1. No acute abdominopelvic abnormality. No evidence of retroperitoneal hematoma. 2. Findings of volume overload with trace right and small left pleural effusions, diffuse body wall edema, trace  presacral edema, and pericardial effusion. 3.  Aortic Atherosclerosis (ICD10-I70.0). Electronically Signed   By: Limin  Xu M.D.   On: 06/10/2024 16:05   NM Pulmonary Perfusion Result Date: 06/06/2024 CLINICAL DATA:  Shortness of breath on minimal exertion. Deep venous thrombosis. EXAM: NUCLEAR MEDICINE PERFUSION LUNG SCAN TECHNIQUE: Perfusion images were obtained in multiple projections after intravenous injection of radiopharmaceutical. No ventilation imaging obtained. RADIOPHARMACEUTICALS:  4.4 mCi Tc-48m MAA IV COMPARISON:  Chest radiographs 06/05/2024.  Chest CT 03/22/2024 FINDINGS: The pulmonary perfusion is within normal limits. There are no wedge-shaped perfusion defects to suggest acute pulmonary embolism. IMPRESSION: No evidence of acute pulmonary embolism on perfusion scintigraphy by PISAPED criteria. Electronically Signed   By: Elsie Perone M.D.   On: 06/06/2024 15:36      Subjective: Patient seen and examined at bedside today.  Hemodynamically stable.  Overall comfortable.  Does not complain of any shortness of breath at rest or chest pain.  Blood pressure stable.  Medically stable for discharge home today.  Discharge Exam: Vitals:   07/06/24 0754 07/06/24 1021  BP: 131/82 (!) 143/84  Pulse: 86 78  Resp: 18 18  Temp: 98.5 F (36.9 C) 98.4 F (36.9 C)  SpO2: 98% 96%   Vitals:   07/05/24 2326 07/06/24 0427 07/06/24 0754 07/06/24 1021  BP: 138/79 (!) 147/91 131/82 (!) 143/84  Pulse: 89 90 86 78  Resp: 18 18 18 18   Temp: 99.5 F (37.5 C)  98.6 F (37 C) 98.5 F (36.9 C) 98.4 F (36.9 C)  TempSrc: Oral Oral Oral Oral  SpO2: 97% 97% 98% 96%  Weight:      Height:        General: Pt is alert, awake, not in acute distress Cardiovascular: RRR, S1/S2 +, no rubs, no gallops Respiratory: CTA bilaterally, no wheezing, no rhonchi Abdominal: Soft, NT, ND, bowel sounds + Extremities: no edema, no cyanosis    The results of significant diagnostics from this hospitalization (including imaging, microbiology, ancillary and laboratory) are listed below for reference.     Microbiology: Recent Results (from the past 240 hours)  Blood culture (routine x 2)     Status: None   Collection Time: 06/27/24  3:06 PM   Specimen: BLOOD  Result Value Ref Range Status   Specimen Description   Final    BLOOD LEFT ANTECUBITAL Performed at [facility], [address], [place], [place] [address]    Special Requests   Final    Blood Culture results may not be optimal due to an inadequate volume of blood received in culture bottles BOTTLES DRAWN AEROBIC AND ANAEROBIC Performed at [facility], [address] [place], [place] [address]    Culture   Final    NO GROWTH 5 DAYS Performed at [facility], [address] [place], [place] [address]    Report Status 07/02/2024 FINAL  Final     Labs: BNP (last 3 results) No results for input(s): BNP in the last 8760 hours. Basic Metabolic Panel: Recent Labs  Lab 06/30/24 0422 07/01/24 0210 07/05/24 0233  NA 135 132* 133*  K 3.4* 3.9 3.6  CL 96* 95* 96*  CO2 30 29 27   GLUCOSE 94 94 87  BUN 36* 31* 18  CREATININE 1.42* 1.56* 1.55*  CALCIUM  8.9 8.2* 8.3*  MG 1.9 1.7  --   PHOS 2.5 2.1*  --    Liver Function Tests: No results for input(s): AST, ALT, ALKPHOS, BILITOT, PROT, ALBUMIN  in the last  168 hours. No results for input(s): LIPASE, AMYLASE in the last 168 hours. No results for input(s): AMMONIA in the last 168  hours. CBC: Recent Labs  Lab 07/02/24 0847 07/03/24 0237 07/04/24 0235 07/05/24 0233 07/06/24 0259  WBC 5.9 6.4 5.5 5.0 5.4  HGB 6.8* 6.3* 6.5* 6.7* 6.4*  HCT 19.5* 18.0* 18.6* 19.0* 18.5*  MCV 74.7* 73.2* 73.8* 75.1* 74.0*  PLT 258 252 273 269 265   Cardiac Enzymes: No results for input(s): CKTOTAL, CKMB, CKMBINDEX, TROPONINI in the last 168 hours. BNP: Invalid input(s): POCBNP CBG: No results for input(s): GLUCAP in the last 168 hours. D-Dimer No results for input(s): DDIMER in the last 72 hours. Hgb A1c No results for input(s): HGBA1C in the last 72 hours. Lipid Profile No results for input(s): CHOL, HDL, LDLCALC, TRIG, CHOLHDL, LDLDIRECT in the last 72 hours. Thyroid  function studies No results for input(s): TSH, T4TOTAL, T3FREE, THYROIDAB in the last 72 hours.  Invalid input(s): FREET3 Anemia work up No results for input(s): VITAMINB12, FOLATE, FERRITIN, TIBC, IRON , RETICCTPCT in the last 72 hours. Urinalysis    Component Value Date/Time   COLORURINE YELLOW 06/04/2024 1345   APPEARANCEUR HAZY (A) 06/04/2024 1345   LABSPEC 1.011 06/04/2024 1345   PHURINE 5.0 06/04/2024 1345   GLUCOSEU NEGATIVE 06/04/2024 1345   HGBUR LARGE (A) 06/04/2024 1345   BILIRUBINUR NEGATIVE 06/04/2024 1345   KETONESUR NEGATIVE 06/04/2024 1345   PROTEINUR 100 (A) 06/04/2024 1345   NITRITE NEGATIVE 06/04/2024 1345   LEUKOCYTESUR SMALL (A) 06/04/2024 1345   Sepsis Labs Recent Labs  Lab 07/03/24 0237 07/04/24 0235 07/05/24 0233 07/06/24 0259  WBC 6.4 5.5 5.0 5.4   Microbiology Recent Results (from the past 240 hours)  Blood culture (routine x 2)     Status: None   Collection Time: 06/27/24  3:06 PM   Specimen: BLOOD  Result Value Ref Range Status   Specimen Description   Final    BLOOD LEFT ANTECUBITAL Performed at [facility], [address] [place], [place] [address]    Special Requests   Final     Blood Culture results may not be optimal due to an inadequate volume of blood received in culture bottles BOTTLES DRAWN AEROBIC AND ANAEROBIC Performed at [facility], [address] [place], [place] [address]    Culture   Final    NO GROWTH 5 DAYS Performed at [facility], [address] [place], [place] [address]    Report Status 07/02/2024 FINAL  Final    Please note: You were cared for by a hospitalist during your hospital stay. Once you are discharged, your primary care physician will handle any further medical issues. Please note that NO REFILLS for any discharge medications will be authorized once you are discharged, as it is imperative that you return to your primary care physician (or establish a relationship with a primary care physician if you do not have one) for your post hospital discharge needs so that they can reassess your need for medications and monitor your lab values.    Time coordinating discharge: 40 minutes  SIGNED:   Ivonne Mustache, MD  [facility] 07/06/2024, 11:14 AM Pager [phone]  If 7PM-7AM, please contact night-coverage [email or web address] Password [email or web address]    [1]  Allergies Allergen Reactions   Shellfish Allergy Anaphylaxis and Swelling   Avocado Swelling    Lips   Codeine  Other (See Comments)    Constipation   Iodinated Contrast Media Other (See Comments)   Mushroom  Other (See Comments)    Itchy throat   Pineapple Swelling    Lip swelling    Penicillins Rash    Reaction: Childhood

## 2024-07-06 NOTE — Telephone Encounter (Signed)
 Dr. Pietro from [facility] calling about mutual patient.

## 2024-07-06 NOTE — Telephone Encounter (Signed)
" °  Reached out to the patient regarding upcoming visits. We discussed that her echocardiogram appointment is Monday Jan. 12 at 9 AM, she has been instructed to arrive at 8:15AM to [facility] entrance to undergo repeat echo.  Then she has an appointment with Thom Sluder, PA-C scheduled 10:05 AM on the following day, Tuesday January 13th at our [facility].   Patient confirmed this information and expressed understanding. We went over when to return to the ER if she develops worsening symptoms. She understands. All questions were answered.   Waddell DELENA Donath, PA-C 07/06/2024 2:22 PM  "

## 2024-07-06 NOTE — Progress Notes (Addendum)
 "  Rounding Note   Patient Name: Evelyn Baker Date of Encounter: 07/06/2024  [facility] Cardiologist: Madonna Large, DO   Subjective No chest pain or dyspnea; mild cough  Scheduled Meds:  azaTHIOprine   100 mg Oral Daily   carvedilol   6.25 mg Oral BID WC   colchicine   0.6 mg Oral BID   Darbepoetin Alfa   100 mcg Subcutaneous Q7 days   ferrous sulfate   325 mg Oral TID WC   fluticasone  furoate-vilanterol  1 puff Inhalation Daily   folic acid   1 mg Oral Daily   furosemide   20 mg Oral Daily   isosorbide -hydrALAZINE   2 tablet Oral TID   levothyroxine   112 mcg Oral q AM   pantoprazole   40 mg Oral BID   predniSONE   20 mg Oral Q breakfast   senna  1 tablet Oral BID   Continuous Infusions:  heparin  1,450 Units/hr (07/05/24 1209)   PRN Meds: acetaminophen  **OR** [DISCONTINUED] acetaminophen , albuterol , guaiFENesin -dextromethorphan , hydrALAZINE , ondansetron  **OR** ondansetron  (ZOFRAN ) IV, senna-docusate   Vital Signs  Vitals:   07/05/24 2020 07/05/24 2326 07/06/24 0427 07/06/24 0754  BP: (!) 140/88 138/79 (!) 147/91 131/82  Pulse: 88 89 90 86  Resp: 18 18 18 18   Temp: 98.7 F (37.1 C) 99.5 F (37.5 C) 98.6 F (37 C) 98.5 F (36.9 C)  TempSrc: Oral Oral Oral Oral  SpO2: 97% 97% 97% 98%  Weight:      Height:        Intake/Output Summary (Last 24 hours) at 07/06/2024 0820 Last data filed at 07/06/2024 0001 Gross per 24 hour  Intake 480 ml  Output --  Net 480 ml      07/04/2024    4:48 AM 07/02/2024    3:04 AM 06/30/2024    8:22 AM  Last 3 Weights  Weight (lbs) 191 lb 4.8 oz 194 lb 14.2 oz 193 lb 3.2 oz  Weight (kg) 86.773 kg 88.4 kg 87.635 kg      Telemetry Sinus- Personally Reviewed   Physical Exam  GEN: WD WN Neck: Supple Cardiac: RRR, no gallop Respiratory: CTA GI: Soft, NT/ND MS: No edema Neuro:  Grossly intact Psych: Normal affect   Labs  Recent Labs  Lab 06/27/24 1502 06/27/24 1714  TRNPT 43* 39*       Chemistry Recent Labs  Lab  06/30/24 0422 07/01/24 0210 07/05/24 0233  NA 135 132* 133*  K 3.4* 3.9 3.6  CL 96* 95* 96*  CO2 30 29 27   GLUCOSE 94 94 87  BUN 36* 31* 18  CREATININE 1.42* 1.56* 1.55*  CALCIUM  8.9 8.2* 8.3*  MG 1.9 1.7  --   GFRNONAA 42* 37* 38*  ANIONGAP 9 8 10      Hematology Recent Labs  Lab 07/04/24 0235 07/05/24 0233 07/06/24 0259  WBC 5.5 5.0 5.4  RBC 2.52* 2.53* 2.50*  HGB 6.5* 6.7* 6.4*  HCT 18.6* 19.0* 18.5*  MCV 73.8* 75.1* 74.0*  MCH 25.8* 26.5 25.6*  MCHC 34.9 35.3 34.6  RDW 20.1* 20.1* 20.1*  PLT 273 269 265    BNP No results for input(s): BNP, PROBNP in the last 168 hours.     Radiology  ECHOCARDIOGRAM LIMITED Result Date: 07/05/2024    ECHOCARDIOGRAM LIMITED REPORT   Patient Name:   Evelyn Baker Date of Exam: 07/05/2024 Medical Rec #:  [id number]       Height:       65.0 in Accession #:    [id number]  Weight:       191.3 lb Date of Birth:  May 23, 1961       BSA:          1.941 m Patient Age:    63 years        BP:           162/94 mmHg Patient Gender: F               HR:           101 bpm. Exam Location:  Inpatient Procedure: Limited Echo, Cardiac Doppler and Color Doppler (Both Spectral and            Color Flow Doppler were utilized during procedure). Indications:    I31.3 Pericardial effusion (noninflammatory)  History:        Patient has prior history of Echocardiogram examinations, most                 recent 07/01/2024. CHF; Stroke. Pulmonary edema. Pericardial                 effision.  Sonographer:    Ellouise Mose RDCS Referring Phys: [id number] Mikyla Schachter S Timmya Blazier IMPRESSIONS  1. Left ventricular ejection fraction, by estimation, is 60 to 65%. The left ventricle has normal function. There is moderate asymmetric left ventricular hypertrophy of the septal segment.  2. No right ventricular collapse. Right ventricular systolic function is normal. The right ventricular size is normal. There is normal pulmonary artery systolic pressure.  3. Large pericardial effusion. The  pericardial effusion is circumferential. Large pleural effusion in the left lateral region.  4. The aortic valve is tricuspid. There is mild calcification of the aortic valve. Aortic valve sclerosis is present, with no evidence of aortic valve stenosis.  5. The inferior vena cava is dilated in size with >50% respiratory variability, suggesting right atrial pressure of 8 mmHg. Comparison(s): Prior images reviewed side by side. Pericardial effusion is larger. Improvement in respirophasic variation across the mitral valve; persistent variation across the tricuspid valve. No evidence of ventricular or atrial collapse on either study. New tachycardia present on this study, but without IVC plethora. Stable pleural effusion. FINDINGS  Left Ventricle: Left ventricular ejection fraction, by estimation, is 60 to 65%. The left ventricle has normal function. There is moderate asymmetric left ventricular hypertrophy of the septal segment. Right Ventricle: No right ventricular collapse. The right ventricular size is normal. No increase in right ventricular wall thickness. Right ventricular systolic function is normal. There is normal pulmonary artery systolic pressure. The tricuspid regurgitant velocity is 2.15 m/s, and with an assumed right atrial pressure of 8 mmHg, the estimated right ventricular systolic pressure is 26.5 mmHg. Pericardium: A large pericardial effusion is present. The pericardial effusion is circumferential. Tricuspid Valve: The tricuspid valve is normal in structure. Tricuspid valve regurgitation is not demonstrated. No evidence of tricuspid stenosis. Aortic Valve: The aortic valve is tricuspid. There is mild calcification of the aortic valve. Aortic valve sclerosis is present, with no evidence of aortic valve stenosis. Venous: The inferior vena cava is dilated in size with greater than 50% respiratory variability, suggesting right atrial pressure of 8 mmHg. Additional Comments: There is a large pleural  effusion in the left lateral region. Spectral Doppler performed. Color Doppler performed.  LEFT VENTRICLE PLAX 2D LVIDd:         3.20 cm LVIDs:         2.30 cm LV PW:         1.20 cm  LV IVS:        1.40 cm  LV Volumes (MOD) LV vol d, MOD A2C: 58.7 ml LV vol d, MOD A4C: 55.8 ml LV vol s, MOD A2C: 20.6 ml LV vol s, MOD A4C: 24.6 ml LV SV MOD A2C:     38.1 ml LV SV MOD A4C:     55.8 ml LV SV MOD BP:      34.9 ml IVC IVC diam: 2.10 cm  AORTA Ao Asc diam: 3.20 cm TRICUSPID VALVE TR Peak grad:   18.5 mmHg TR Vmax:        215.00 cm/s Stanly Leavens MD Electronically signed by Stanly Leavens MD Signature Date/Time: 07/05/2024/10:40:36 AM    Final      Patient Profile   64 y.o. female Jehovah's Witness, history of CVA, hypertension, hyperlipidemia, mixed connective tissue disease, rheumatoid arthritis for evaluation of pericardial effusion.  Venous ultrasound June 03, 2024 worrisome for left common femoral vein nonocclusive thrombus.  Follow-up VQ scan not suggestive of pulmonary embolus.  Echocardiogram December 31 showed normal LV function, mild left ventricular hypertrophy, grade 1 diastolic dysfunction, large pericardial effusion but no evidence of tamponade.  Assessment & Plan   1 large pericardial effusion-etiology unclear.  Patient did have bronchitis earlier this month.  Question viral related.  Also with history of mixed connective tissue disease.  C-reactive protein elevated at 8.5 and sed rate 88.  Continue colchicine  and prednisone  initiated yesterday.  Patient not on NSAID due to need for anticoagulation and ongoing microcytic anemia.  Patient had been previously scheduled for pericardiocentesis but remains anemic with hemoglobin 6.4.  She is also a Jehovah's Witness and would be unwilling to accept blood products.  I have reviewed the patient with my interventional colleagues including Dr. Mady, Dr. Verlin and Dr Wendel.  Patient would be extremely high risk for any procedure including  pericardiocentesis; if she had any bleeding complications she would likely have a poor outcome.  We would like to avoid pericardiocentesis if possible.  Follow-up echocardiogram yesterday showed large pericardial effusion slightly increased in size compared to previous.  However IVC was not dilated and no right atrial or right ventricular collapse.  There was note of respiratory flow variation.  I will plan to continue prednisone  and colchicine  as an outpatient.  She remains asymptomatic.  Will allow her to be discharged today.  Will plan follow-up echocardiogram early next week as well as APP office visit.  If she develops worsening dyspnea, tachycardia or decreasing blood pressure she will return and may need high risk pericardiocentesis.  Above was discussed in detail with patient and husband.  2 recent DVT-transition back to apixaban  5 mg twice daily at discharge.  3 hypertension-patient's blood pressure is improved.  Continue present medications.  4 acute diastolic congestive heart failure/pleural effusions-continue low-dose Lasix .  5 acute kidney injury-check potassium and renal function 1 week after discharge.  I will also try and contact Dr. Dolphus for any input concerning treatment for her mixed connective tissue disorder.  Addendum: I discussed the patient with Dr. Dr. Dolphus.  Her only suggestion for treating possible pericarditis/pericardial effusion was to increase her prednisone  to 40 mg daily.  We will proceed with this and do a slow taper.  Would plan 40 mg daily for 2 weeks then 30 mg daily for 2 weeks then 20 mg daily for 2 weeks then 10 mg daily for 2 weeks and discontinue.  For questions or updates, please contact [facility] Please consult  [email or web address] for contact info under       Signed, Redell Shallow, MD  07/06/2024, 8:20 AM    "

## 2024-07-06 NOTE — TOC Transition Note (Signed)
 Transition of Care [facility]) - Discharge Note   Patient Details  Name: Evelyn Baker MRN: [id number] Date of Birth: September 28, 1960  [facility] of [facility] CM/SW Contact:  Waddell Barnie Rama, RN Phone Number: 07/06/2024, 11:15 AM   Clinical Narrative:    For dc today, has follow up with PCP on AVS. No needs.     Barriers to Discharge: Continued Medical Work up   Patient Goals and CMS Choice            Discharge Placement                       Discharge Plan and Services Additional resources added to the After Visit Summary for     Discharge Planning Services: CM Consult                                 Social Drivers of Health (SDOH) Interventions SDOH Screenings   Food Insecurity: No Food Insecurity (06/28/2024)  Housing: Low Risk (06/28/2024)  Transportation Needs: No Transportation Needs (06/28/2024)  Utilities: Not At Risk (06/28/2024)  Social Connections: Socially Integrated (06/28/2024)  Tobacco Use: Low Risk (06/28/2024)     Readmission Risk Interventions    06/06/2024    3:35 PM  Readmission Risk Prevention Plan  Transportation Screening Complete  PCP or Specialist Appt within 5-7 Days Complete  Home Care Screening Complete  Medication Review (RN CM) Complete

## 2024-07-06 NOTE — Telephone Encounter (Signed)
 PT called to reschedule appt, day and time confirmed.

## 2024-07-08 NOTE — Progress Notes (Unsigned)
" °  Cardiology Office Note:  .   Date:  07/08/2024  ID:  Evelyn Baker, DOB [birth year], MRN [id number] PCP: Katina Pfeiffer, PA-C  [facility] Providers Cardiologist:  Madonna Large, DO {  History of Present Illness: .   Evelyn Baker is a 64 y.o. female with history of coronary artery calcifications, hypertension, hyperlipidemia, TIA/CVA, hypothyroidism, depression, asthma, GERD, RA, anemia, DVT 05/2024     Coronary artery calcifications 03/2023 nuclear stress test equivocal for ischemia.  No ischemia or infarction.  Low risk.  Hypertension  03/2024 valsartan  increased to 320 mg did not tolerate spironolactone  and switch to chlorthalidone . 05/2024 office visit started on BiDil  20/37.5 mg 3 times daily.  DVT Noted 05/2024.  Follow-up VQ scan with no PE.  Social history      Patient was recently admitted 05/2024 found to have large pericardial effusion.  No tamponade.  EF 60 to 65%.  Felt to be high risk for pericardiocentesis given anemia (hemoglobin 6) and Jehovah's Witness and unwilling to accept blood products.  Follow-up echocardiogram during admission with slight increase in size.  Treated with prednisone  and colchicine .  Etiology possibly related to connective tissue disease and viral infection.  CRP 8.5.  Sed rate 88.  Dr. Dolphus was called and recommended to treat for pericarditis and provided steroid taper.  Started on colchicine .  Pericardial effusion Etiology not clear.  CRP 8.5 and sed rate 88 while inpatient.  Recent history of bronchitis although has mixed connective tissue disease. Plan for prednisone  taper as following.  40 mg daily for 2 weeks then 30 mg daily for 2 weeks then 20 mg daily for 2 weeks then 10 mg daily for 2 weeks and discontinue.  Started 1/7. If she develops worsening dyspnea, tachycardia or decreasing blood pressure she will return and may need high risk pericardiocentesis given anemia and Jehovah's Witness Repeat inflammatory markers  CRP/ESR. Repeat limited echocardiogram?  Chronic HFpEF  Hypertension Did not tolerate spironolactone  due to  Coronary artery calcifications -03/2023 nuclear stress test equivocal for ischemia.  No ischemia or infarction.  Low risk.  DVT -05/2024 On Eliquis  5 mg twice daily.  Anemia Jehovah's Witness Noted.  AKI Repeat BMP.      ROS: Denies: Chest pain, shortness of breath, orthopnea, peripheral edema, palpitations, syncope, decreased exercise capacity, fatigue, dizziness.   Studies Reviewed: .         Risk Assessment/Calculations:   {Does this patient have ATRIAL FIBRILLATION?[phone]} No BP recorded.  {Refresh Note OR Click here to enter BP  :1}***       Physical Exam:   VS:  There were no vitals taken for this visit.   Wt Readings from Last 3 Encounters:  07/04/24 191 lb 4.8 oz (86.8 kg)  06/20/24 203 lb 11.2 oz (92.4 kg)  06/15/24 209 lb (94.8 kg)    GEN: Well nourished, well developed in no acute distress NECK: No JVD; No carotid bruits CARDIAC: ***RRR, no murmurs, rubs, gallops RESPIRATORY:  Clear to auscultation without rales, wheezing or rhonchi  ABDOMEN: Soft, non-tender, non-distended EXTREMITIES:  No edema; No deformity   ASSESSMENT AND PLAN: .         {Are you ordering a CV Procedure (e.g. stress test, cath, DCCV, TEE, etc)?   Press F2        :[id number]}  Dispo: ***  Signed, Thom LITTIE Sluder, PA-C  "

## 2024-07-11 ENCOUNTER — Ambulatory Visit (HOSPITAL_COMMUNITY): Admission: RE | Admit: 2024-07-11 | Discharge: 2024-07-11 | Attending: Cardiology | Admitting: Cardiology

## 2024-07-11 DIAGNOSIS — I3139 Other pericardial effusion (noninflammatory): Secondary | ICD-10-CM | POA: Diagnosis not present

## 2024-07-11 DIAGNOSIS — I119 Hypertensive heart disease without heart failure: Secondary | ICD-10-CM | POA: Insufficient documentation

## 2024-07-11 LAB — ECHOCARDIOGRAM LIMITED
S' Lateral: 2.8 cm
Single Plane A4C EF: 69.1 %

## 2024-07-11 LAB — LAB REPORT - SCANNED: EGFR: 57

## 2024-07-11 NOTE — Progress Notes (Unsigned)
 "  Office Visit Note  Patient: Evelyn Baker             Date of Birth: 01/07/1961           MRN: [id number]             PCP: Katina Pfeiffer, PA-C Referring: Katina Pfeiffer, PA-C Visit Date: 07/25/2024 Occupation: Data Unavailable  Subjective:  No chief complaint on file.   History of Present Illness: Evelyn Baker is a 64 y.o. female ***   Patient was discharged from the hospital on July 06, 2024 after being hospitalized for pericardial effusion and pleural effusion.  She was also very anemic with hemoglobin of 6.  I received a phone call from Dr. Pietro as patient was Evelyn Baker she could not get any blood transfusion.  She was at risk of pericardial tap because she is on Eliquis .  He placed patient on prednisone  40 mg p.o. daily and colchicine .  According to the discharge summary from July 07, 2023 she was hospitalized with acute on chronic congestive heart failure.  She continues to be on Imuran .  Activities of Daily Living:  Patient reports morning stiffness for *** {minute/hour:19697}.   Patient {ACTIONS;DENIES/REPORTS:21021675::Denies} nocturnal pain.  Difficulty dressing/grooming: {ACTIONS;DENIES/REPORTS:21021675::Denies} Difficulty climbing stairs: {ACTIONS;DENIES/REPORTS:21021675::Denies} Difficulty getting out of chair: {ACTIONS;DENIES/REPORTS:21021675::Denies} Difficulty using hands for taps, buttons, cutlery, and/or writing: {ACTIONS;DENIES/REPORTS:21021675::Denies}  No Rheumatology ROS completed.   PMFS History:  Patient Active Problem List   Diagnosis Date Noted   Acute on chronic heart failure with preserved ejection fraction (HFpEF) (HCC) 07/02/2024   Anemia due to chronic kidney disease 07/01/2024   Pericardial effusion 06/29/2024   Pulmonary edema cardiac cause (HCC) 06/28/2024   Congestive heart failure (HCC) 06/27/2024   Refusal of blood transfusion for reasons of conscience 06/09/2024   AKI (acute kidney injury) 06/03/2024    Acute deep vein thrombosis (DVT) of left femoral vein (HCC) 06/03/2024   Normocytic anemia 06/03/2024   Hypokalemia 06/03/2024   History of CVA (cerebrovascular accident) 06/03/2024   Hypertension    Hypothyroidism    Hyperlipidemia    Asthma    Coronary artery disease involving native coronary artery of native heart without angina pectoris 04/12/2024   Mixed connective tissue disease 10/09/2023   Primary osteoarthritis of both feet 10/09/2023   Pain of left lower leg 07/07/2022   Dyspareunia in female 05/05/2022   Abnormal urine 05/05/2022   Chronic constipation 05/05/2022   Fibrocystic disease of breast 05/05/2022   Obesity 05/05/2022   Reactive depression (situational) 05/05/2022   Chronic venous insufficiency 02/13/2020   Leg edema, left 02/13/2020    Past Medical History:  Diagnosis Date   Asthma    Depression    Dyspnea    Hyperlipidemia    Hypertension    Hypothyroidism    Sciatic nerve disease    Thyroid  disease     Family History  Problem Relation Age of Onset   Heart failure Mother    Kidney failure Mother    Stroke Sister    Aneurysm Sister    Heart Problems Brother    Hypertension Son    Hypertension Daughter    Past Surgical History:  Procedure Laterality Date   FOOT SURGERY Left    STRABISMUS SURGERY     age 43   TOTAL ABDOMINAL HYSTERECTOMY     VEIN LIGATION AND STRIPPING Left 01/22/2021   Procedure: LEFT GREATER SAPHENOUS VEIN  LIGATION AND STRIPPING;  Surgeon: Harvey Carlin BRAVO, MD;  Location: [facility] OR;  Service: Vascular;  Laterality: Left;   Social History[1] Social History   Social History Narrative   Not on file     Immunization History  Administered Date(s) Administered   Influenza, Seasonal, Injecte, Preservative Fre 04/14/2024   Influenza-Unspecified 03/30/2014, 07/13/2019, 03/01/2023   Pfizer(Comirnaty)Fall Seasonal Vaccine 12 years and older 03/10/2023     Objective: Vital Signs: There were no vitals taken for this visit.    Physical Exam   Musculoskeletal Exam: ***  CDAI Exam: CDAI Score: -- Patient Global: --; Provider Global: -- Swollen: --; Tender: -- Joint Exam 07/25/2024   No joint exam has been documented for this visit   There is currently no information documented on the homunculus. Go to the Rheumatology activity and complete the homunculus joint exam.  Investigation: No additional findings.  Imaging: ECHOCARDIOGRAM LIMITED Result Date: 07/05/2024    ECHOCARDIOGRAM LIMITED REPORT   Patient Name:   Evelyn Baker Date of Exam: 07/05/2024 Medical Rec #:  [id number]       Height:       65.0 in Accession #:    [id number]      Weight:       191.3 lb Date of Birth:  Nov 17, 1960       BSA:          1.941 m Patient Age:    63 years        BP:           162/94 mmHg Patient Gender: F               HR:           101 bpm. Exam Location:  Inpatient Procedure: Limited Echo, Cardiac Doppler and Color Doppler (Both Spectral and            Color Flow Doppler were utilized during procedure). Indications:    I31.3 Pericardial effusion (noninflammatory)  History:        Patient has prior history of Echocardiogram examinations, most                 recent 07/01/2024. CHF; Stroke. Pulmonary edema. Pericardial                 effision.  Sonographer:    Ellouise Mose RDCS Referring Phys: [id number] BRIAN S CRENSHAW IMPRESSIONS  1. Left ventricular ejection fraction, by estimation, is 60 to 65%. The left ventricle has normal function. There is moderate asymmetric left ventricular hypertrophy of the septal segment.  2. No right ventricular collapse. Right ventricular systolic function is normal. The right ventricular size is normal. There is normal pulmonary artery systolic pressure.  3. Large pericardial effusion. The pericardial effusion is circumferential. Large pleural effusion in the left lateral region.  4. The aortic valve is tricuspid. There is mild calcification of the aortic valve. Aortic valve sclerosis is present, with no evidence of  aortic valve stenosis.  5. The inferior vena cava is dilated in size with >50% respiratory variability, suggesting right atrial pressure of 8 mmHg. Comparison(s): Prior images reviewed side by side. Pericardial effusion is larger. Improvement in respirophasic variation across the mitral valve; persistent variation across the tricuspid valve. No evidence of ventricular or atrial collapse on either study. New tachycardia present on this study, but without IVC plethora. Stable pleural effusion. FINDINGS  Left Ventricle: Left ventricular ejection fraction, by estimation, is 60 to 65%. The left ventricle has normal function. There is moderate asymmetric left ventricular hypertrophy of the septal segment. Right Ventricle: No right  ventricular collapse. The right ventricular size is normal. No increase in right ventricular wall thickness. Right ventricular systolic function is normal. There is normal pulmonary artery systolic pressure. The tricuspid regurgitant velocity is 2.15 m/s, and with an assumed right atrial pressure of 8 mmHg, the estimated right ventricular systolic pressure is 26.5 mmHg. Pericardium: A large pericardial effusion is present. The pericardial effusion is circumferential. Tricuspid Valve: The tricuspid valve is normal in structure. Tricuspid valve regurgitation is not demonstrated. No evidence of tricuspid stenosis. Aortic Valve: The aortic valve is tricuspid. There is mild calcification of the aortic valve. Aortic valve sclerosis is present, with no evidence of aortic valve stenosis. Venous: The inferior vena cava is dilated in size with greater than 50% respiratory variability, suggesting right atrial pressure of 8 mmHg. Additional Comments: There is a large pleural effusion in the left lateral region. Spectral Doppler performed. Color Doppler performed.  LEFT VENTRICLE PLAX 2D LVIDd:         3.20 cm LVIDs:         2.30 cm LV PW:         1.20 cm LV IVS:        1.40 cm  LV Volumes (MOD) LV vol d,  MOD A2C: 58.7 ml LV vol d, MOD A4C: 55.8 ml LV vol s, MOD A2C: 20.6 ml LV vol s, MOD A4C: 24.6 ml LV SV MOD A2C:     38.1 ml LV SV MOD A4C:     55.8 ml LV SV MOD BP:      34.9 ml IVC IVC diam: 2.10 cm  AORTA Ao Asc diam: 3.20 cm TRICUSPID VALVE TR Peak grad:   18.5 mmHg TR Vmax:        215.00 cm/s Stanly Leavens MD Electronically signed by Stanly Leavens MD Signature Date/Time: 07/05/2024/10:40:36 AM    Final    ECHOCARDIOGRAM LIMITED Result Date: 07/01/2024    ECHOCARDIOGRAM LIMITED REPORT   Patient Name:   LLESENIA FOGAL Date of Exam: 07/01/2024 Medical Rec #:  [id number]       Height:       65.0 in Accession #:    [id number]      Weight:       193.2 lb Date of Birth:  December 08, 1960       BSA:          1.949 m Patient Age:    63 years        BP:           129/78 mmHg Patient Gender: F               HR:           87 bpm. Exam Location:  Inpatient Procedure: Limited Echo, Cardiac Doppler and Color Doppler (Both Spectral and            Color Flow Doppler were utilized during procedure). Indications:    Pericardial effusion I31.3  History:        Patient has prior history of Echocardiogram examinations, most                 recent 06/29/2024. Risk Factors:Hypertension.  Sonographer:    Jayson Gaskins Referring Phys: [id number] ARUN K THUKKANI IMPRESSIONS  1. Left ventricular ejection fraction, by estimation, is 60 to 65%. The left ventricle has normal function. The left ventricle has no regional wall motion abnormalities.  2. Right ventricular systolic function is normal. The right ventricular size is normal.  3. No evidence  of RV diastolic collapse but there is clinically significant respiratory variation across the mitral and tricuspid valves which has worsened since prevoius echo. Additionally effusion is now larger. Discussed with interventional and exam and echo not consistent with overt cardiac tamponade. Would repeat echo in the next 48-72 hours or clinical change. Large pericardial effusion.  4. The mitral  valve is normal in structure. No evidence of mitral valve regurgitation. FINDINGS  Left Ventricle: Left ventricular ejection fraction, by estimation, is 60 to 65%. The left ventricle has normal function. The left ventricle has no regional wall motion abnormalities. The left ventricular internal cavity size was normal in size. There is  no left ventricular hypertrophy. Right Ventricle: The right ventricular size is normal. Right vetricular wall thickness was not well visualized. Right ventricular systolic function is normal. Pericardium: No evidence of RV diastolic collapse but there is clinically significant respiratory variation across the mitral and tricuspid valves which has worsened since prevoius echo. Additionally effusion is now larger. Discussed with interventional and exam and echo not consistent with overt cardiac tamponade. Would repeat echo in the next 48-72 hours or clinical change. A large pericardial effusion is present. Mitral Valve: The mitral valve is normal in structure. Tricuspid Valve: The tricuspid valve is normal in structure. Tricuspid valve regurgitation is not demonstrated. LEFT VENTRICLE PLAX 2D LVIDd:         4.10 cm LVIDs:         2.90 cm LV PW:         1.30 cm LV IVS:        1.00 cm LVOT diam:     1.85 cm LVOT Area:     2.69 cm  LEFT ATRIUM             Index LA Vol (A2C):   35.8 ml 18.37 ml/m LA Vol (A4C):   36.8 ml 18.88 ml/m LA Biplane Vol: 36.6 ml 18.78 ml/m   SHUNTS Systemic Diam: 1.85 cm Morene Brownie Electronically signed by Morene Brownie Signature Date/Time: 07/01/2024/11:03:15 AM    Final    ECHOCARDIOGRAM COMPLETE Result Date: 06/29/2024    ECHOCARDIOGRAM REPORT   Patient Name:   AGNIESZKA NEWHOUSE Date of Exam: 06/29/2024 Medical Rec #:  [id number]       Height:       65.0 in Accession #:    [id number]      Weight:       193.1 lb Date of Birth:  02-20-1961       BSA:          1.949 m Patient Age:    63 years        BP:           136/80 mmHg Patient Gender: F                HR:           80 bpm. Exam Location:  Inpatient Procedure: 2D Echo, Cardiac Doppler and Color Doppler (Both Spectral and Color            Flow Doppler were utilized during procedure). Indications:    CHF - Acute Diastolic  History:        Patient has prior history of Echocardiogram examinations, most                 recent 03/17/2023. CHF, CAD, Signs/Symptoms:Edema, Shortness of                 Breath and Dyspnea; Risk  Factors:Hypertension and Dyslipidemia.  Sonographer:    Juliene Rucks Referring Phys: [id number] JEFFREY T [facility]  Sonographer Comments: Patient is obese. IMPRESSIONS  1. Large pericardial effusion; IVC not dilated and no RV diastolic collapse to suggest tamponade.  2. Left ventricular ejection fraction, by estimation, is 60 to 65%. The left ventricle has normal function. The left ventricle has no regional wall motion abnormalities. There is mild left ventricular hypertrophy. Left ventricular diastolic parameters are consistent with Grade I diastolic dysfunction (impaired relaxation).  3. Right ventricular systolic function is normal. The right ventricular size is normal.  4. Large pericardial effusion. There is no evidence of cardiac tamponade.  5. The mitral valve is normal in structure. Trivial mitral valve regurgitation. No evidence of mitral stenosis.  6. The aortic valve is tricuspid. Aortic valve regurgitation is not visualized. No aortic stenosis is present.  7. The inferior vena cava is normal in size with greater than 50% respiratory variability, suggesting right atrial pressure of 3 mmHg. FINDINGS  Left Ventricle: Left ventricular ejection fraction, by estimation, is 60 to 65%. The left ventricle has normal function. The left ventricle has no regional wall motion abnormalities. The left ventricular internal cavity size was normal in size. There is  mild left ventricular hypertrophy. Left ventricular diastolic parameters are consistent with Grade I diastolic dysfunction (impaired relaxation).  Right Ventricle: The right ventricular size is normal. Right ventricular systolic function is normal. Left Atrium: Left atrial size was normal in size. Right Atrium: Right atrial size was normal in size. Pericardium: A large pericardial effusion is present. There is no evidence of cardiac tamponade. Mitral Valve: The mitral valve is normal in structure. Trivial mitral valve regurgitation. No evidence of mitral valve stenosis. Tricuspid Valve: The tricuspid valve is normal in structure. Tricuspid valve regurgitation is trivial. No evidence of tricuspid stenosis. Aortic Valve: The aortic valve is tricuspid. Aortic valve regurgitation is not visualized. No aortic stenosis is present. Pulmonic Valve: The pulmonic valve was normal in structure. Pulmonic valve regurgitation is trivial. No evidence of pulmonic stenosis. Aorta: The aortic root is normal in size and structure. Venous: The inferior vena cava is normal in size with greater than 50% respiratory variability, suggesting right atrial pressure of 3 mmHg. IAS/Shunts: No atrial level shunt detected by color flow Doppler. Additional Comments: Large pericardial effusion; IVC not dilated and no RV diastolic collapse to suggest tamponade.  LEFT VENTRICLE PLAX 2D LVIDd:         4.30 cm      Diastology LVIDs:         2.70 cm      LV e' medial:    6.85 cm/s LV PW:         1.20 cm      LV E/e' medial:  16.1 LV IVS:        1.10 cm      LV e' lateral:   6.64 cm/s LVOT diam:     1.90 cm      LV E/e' lateral: 16.6 LV SV:         59 LV SV Index:   30 LVOT Area:     2.84 cm  LV Volumes (MOD) LV vol d, MOD A2C: 128.0 ml LV vol d, MOD A4C: 106.0 ml LV vol s, MOD A2C: 61.3 ml LV vol s, MOD A4C: 45.8 ml LV SV MOD A2C:     66.7 ml LV SV MOD A4C:     106.0 ml LV SV MOD BP:  64.9 ml RIGHT VENTRICLE             IVC RV Basal diam:  2.90 cm     IVC diam: 2.00 cm RV Mid diam:    2.20 cm RV S prime:     13.40 cm/s TAPSE (M-mode): 1.8 cm LEFT ATRIUM             Index        RIGHT ATRIUM            Index LA diam:        2.80 cm 1.44 cm/m   RA Area:     15.80 cm LA Vol (A2C):   47.6 ml 24.42 ml/m  RA Volume:   41.60 ml  21.34 ml/m LA Vol (A4C):   24.2 ml 12.42 ml/m LA Biplane Vol: 35.2 ml 18.06 ml/m  AORTIC VALVE LVOT Vmax:   107.00 cm/s LVOT Vmean:  72.100 cm/s LVOT VTI:    0.207 m  AORTA Ao Root diam: 2.90 cm Ao Asc diam:  2.80 cm MITRAL VALVE                TRICUSPID VALVE MV Area (PHT): 3.72 cm     TR Peak grad:   23.2 mmHg MV Decel Time: 204 msec     TR Vmax:        241.00 cm/s MV E velocity: 110.00 cm/s MV A velocity: 107.00 cm/s  SHUNTS MV E/A ratio:  1.03         Systemic VTI:  0.21 m                             Systemic Diam: 1.90 cm Redell Shallow MD Electronically signed by Redell Shallow MD Signature Date/Time: 06/29/2024/9:10:21 AM    Final    DG Chest 2 View Result Date: 06/27/2024 EXAM: 2 VIEW(S) XRAY OF THE CHEST 06/27/2024 03:49:00 PM COMPARISON: 06/05/2024 CLINICAL HISTORY: chest pain FINDINGS: LUNGS AND PLEURA: Mild pulmonary edema. New bilateral small pleural effusions with associated atelectasis. No pneumothorax. HEART AND MEDIASTINUM: Mild cardiomegaly. BONES AND SOFT TISSUES: No acute osseous abnormality. IMPRESSION: 1. Mild pulmonary edema with new bilateral small pleural effusions and associated atelectasis. 2. Mild cardiomegaly. Electronically signed by: Greig Pique MD 06/27/2024 06:05 PM EST RP Workstation: [id number]    Recent Labs: Lab Results  Component Value Date   WBC 5.4 07/06/2024   HGB 6.4 (LL) 07/06/2024   PLT 265 07/06/2024   NA 133 (L) 07/05/2024   K 3.6 07/05/2024   CL 96 (L) 07/05/2024   CO2 27 07/05/2024   GLUCOSE 87 07/05/2024   BUN 18 07/05/2024   CREATININE 1.55 (H) 07/05/2024   BILITOT 0.3 06/29/2024   ALKPHOS 73 06/29/2024   AST 74 (H) 06/29/2024   ALT 51 (H) 06/29/2024   PROT 6.6 06/29/2024   ALBUMIN  2.7 (L) 06/29/2024   CALCIUM  8.3 (L) 07/05/2024    Speciality Comments: No specialty comments available.  Procedures:   No procedures performed Allergies: Shellfish allergy, Avocado, Codeine , Iodinated contrast media, Mushroom, Pineapple, and Penicillins   Assessment / Plan:     Visit Diagnoses: No diagnosis found.  Orders: No orders of the defined types were placed in this encounter.  No orders of the defined types were placed in this encounter.   Face-to-face time spent with patient was *** minutes. Greater than 50% of time was spent in counseling and coordination of care.  Follow-Up Instructions: No follow-ups on  file.   Maya Nash, MD  Note - This record has been created using Dragon software.  Chart creation errors have been sought, but may not always  have been located. Such creation errors do not reflect on  the standard of medical care.     [1]  Social History Tobacco Use   Smoking status: Never    Passive exposure: Never   Smokeless tobacco: Never  Vaping Use   Vaping status: Never Used  Substance Use Topics   Alcohol use: No   Drug use: No   "

## 2024-07-12 ENCOUNTER — Encounter: Payer: Self-pay | Admitting: Cardiology

## 2024-07-12 ENCOUNTER — Inpatient Hospital Stay: Admitting: Oncology

## 2024-07-12 ENCOUNTER — Inpatient Hospital Stay: Attending: Oncology

## 2024-07-12 ENCOUNTER — Ambulatory Visit: Attending: Cardiology | Admitting: Cardiology

## 2024-07-12 ENCOUNTER — Ambulatory Visit: Payer: Self-pay | Admitting: Cardiology

## 2024-07-12 ENCOUNTER — Other Ambulatory Visit (HOSPITAL_COMMUNITY): Payer: Self-pay

## 2024-07-12 VITALS — BP 182/90 | HR 79 | Ht 64.5 in | Wt 192.6 lb

## 2024-07-12 VITALS — BP 135/79 | HR 92 | Temp 98.6°F | Resp 16 | Wt 194.2 lb

## 2024-07-12 DIAGNOSIS — N1831 Chronic kidney disease, stage 3a: Secondary | ICD-10-CM | POA: Diagnosis not present

## 2024-07-12 DIAGNOSIS — D509 Iron deficiency anemia, unspecified: Secondary | ICD-10-CM | POA: Insufficient documentation

## 2024-07-12 DIAGNOSIS — J45909 Unspecified asthma, uncomplicated: Secondary | ICD-10-CM | POA: Insufficient documentation

## 2024-07-12 DIAGNOSIS — I129 Hypertensive chronic kidney disease with stage 1 through stage 4 chronic kidney disease, or unspecified chronic kidney disease: Secondary | ICD-10-CM | POA: Diagnosis not present

## 2024-07-12 DIAGNOSIS — I3139 Other pericardial effusion (noninflammatory): Secondary | ICD-10-CM | POA: Insufficient documentation

## 2024-07-12 DIAGNOSIS — G4733 Obstructive sleep apnea (adult) (pediatric): Secondary | ICD-10-CM | POA: Diagnosis not present

## 2024-07-12 DIAGNOSIS — Z7901 Long term (current) use of anticoagulants: Secondary | ICD-10-CM | POA: Diagnosis not present

## 2024-07-12 DIAGNOSIS — Z8719 Personal history of other diseases of the digestive system: Secondary | ICD-10-CM | POA: Diagnosis not present

## 2024-07-12 DIAGNOSIS — Z823 Family history of stroke: Secondary | ICD-10-CM | POA: Insufficient documentation

## 2024-07-12 DIAGNOSIS — D631 Anemia in chronic kidney disease: Secondary | ICD-10-CM | POA: Diagnosis present

## 2024-07-12 DIAGNOSIS — E785 Hyperlipidemia, unspecified: Secondary | ICD-10-CM | POA: Insufficient documentation

## 2024-07-12 DIAGNOSIS — K5900 Constipation, unspecified: Secondary | ICD-10-CM | POA: Diagnosis not present

## 2024-07-12 DIAGNOSIS — M3214 Glomerular disease in systemic lupus erythematosus: Secondary | ICD-10-CM | POA: Insufficient documentation

## 2024-07-12 DIAGNOSIS — M3500 Sicca syndrome, unspecified: Secondary | ICD-10-CM | POA: Insufficient documentation

## 2024-07-12 DIAGNOSIS — D649 Anemia, unspecified: Secondary | ICD-10-CM

## 2024-07-12 DIAGNOSIS — Z79624 Long term (current) use of inhibitors of nucleotide synthesis: Secondary | ICD-10-CM | POA: Diagnosis not present

## 2024-07-12 DIAGNOSIS — E039 Hypothyroidism, unspecified: Secondary | ICD-10-CM | POA: Insufficient documentation

## 2024-07-12 DIAGNOSIS — J811 Chronic pulmonary edema: Secondary | ICD-10-CM | POA: Insufficient documentation

## 2024-07-12 DIAGNOSIS — M069 Rheumatoid arthritis, unspecified: Secondary | ICD-10-CM | POA: Insufficient documentation

## 2024-07-12 DIAGNOSIS — Z880 Allergy status to penicillin: Secondary | ICD-10-CM | POA: Diagnosis not present

## 2024-07-12 DIAGNOSIS — Z885 Allergy status to narcotic agent status: Secondary | ICD-10-CM | POA: Insufficient documentation

## 2024-07-12 DIAGNOSIS — Z90710 Acquired absence of both cervix and uterus: Secondary | ICD-10-CM | POA: Insufficient documentation

## 2024-07-12 DIAGNOSIS — R0789 Other chest pain: Secondary | ICD-10-CM | POA: Insufficient documentation

## 2024-07-12 DIAGNOSIS — M7989 Other specified soft tissue disorders: Secondary | ICD-10-CM | POA: Diagnosis not present

## 2024-07-12 DIAGNOSIS — Z7952 Long term (current) use of systemic steroids: Secondary | ICD-10-CM | POA: Diagnosis not present

## 2024-07-12 DIAGNOSIS — Z8249 Family history of ischemic heart disease and other diseases of the circulatory system: Secondary | ICD-10-CM | POA: Insufficient documentation

## 2024-07-12 DIAGNOSIS — Z79899 Other long term (current) drug therapy: Secondary | ICD-10-CM | POA: Insufficient documentation

## 2024-07-12 DIAGNOSIS — I10 Essential (primary) hypertension: Secondary | ICD-10-CM | POA: Insufficient documentation

## 2024-07-12 DIAGNOSIS — I2510 Atherosclerotic heart disease of native coronary artery without angina pectoris: Secondary | ICD-10-CM | POA: Diagnosis not present

## 2024-07-12 DIAGNOSIS — Z79890 Hormone replacement therapy (postmenopausal): Secondary | ICD-10-CM | POA: Insufficient documentation

## 2024-07-12 DIAGNOSIS — M351 Other overlap syndromes: Secondary | ICD-10-CM | POA: Diagnosis not present

## 2024-07-12 DIAGNOSIS — R531 Weakness: Secondary | ICD-10-CM | POA: Insufficient documentation

## 2024-07-12 DIAGNOSIS — I82412 Acute embolism and thrombosis of left femoral vein: Secondary | ICD-10-CM

## 2024-07-12 DIAGNOSIS — Z8419 Family history of other disorders of kidney and ureter: Secondary | ICD-10-CM | POA: Insufficient documentation

## 2024-07-12 LAB — D-DIMER, QUANTITATIVE: D-Dimer, Quant: 6.07 ug{FEU}/mL — ABNORMAL HIGH (ref 0.00–0.50)

## 2024-07-12 MED ORDER — AMLODIPINE BESYLATE 10 MG PO TABS
10.0000 mg | ORAL_TABLET | Freq: Every day | ORAL | 3 refills | Status: AC
  Filled 2024-07-12: qty 90, 90d supply, fill #0

## 2024-07-12 NOTE — Patient Instructions (Addendum)
 Medication Instructions:  Start Amlodipine  10 mg take one tablet daily *If you need a refill on your cardiac medications before your next appointment, please call your pharmacy*  Lab Work: Today- ESR, CRP If you have labs (blood work) drawn today and your tests are completely normal, you will receive your results only by: MyChart Message (if you have MyChart) OR A paper copy in the mail If you have any lab test that is abnormal or we need to change your treatment, we will call you to review the results.  Testing/Procedures: Your physician has recommended that you have a sleep study. This test records several body functions during sleep, including: brain activity, eye movement, oxygen and carbon dioxide blood levels, heart rate and rhythm, breathing rate and rhythm, the flow of air through your mouth and nose, snoring, body muscle movements, and chest and belly movement.     Your physician has requested that you have an echocardiogram in 2-4 weeks. Echocardiography is a painless test that uses sound waves to create images of your heart. It provides your doctor with information about the size and shape of your heart and how well your hearts chambers and valves are working. This procedure takes approximately one hour. There are no restrictions for this procedure. Please do NOT wear cologne, perfume, aftershave, or lotions (deodorant is allowed). Please arrive 15 minutes prior to your appointment time.  Please note: We ask at that you not bring children with you during ultrasound (echo/ vascular) testing. Due to room size and safety concerns, children are not allowed in the ultrasound rooms during exams. Our front office staff cannot provide observation of children in our lobby area while testing is being conducted. An adult accompanying a patient to their appointment will only be allowed in the ultrasound room at the discretion of the ultrasound technician under special circumstances. We apologize  for any inconvenience.   Follow-Up: At [facility], you and your health needs are our priority.  As part of our continuing mission to provide you with exceptional heart care, our providers are all part of one team.  This team includes your primary Cardiologist (physician) and Advanced Practice Providers or APPs (Physician Assistants and Nurse Practitioners) who all work together to provide you with the care you need, when you need it.  Your next appointment:   4 week(s) post Echo  Provider:   Madonna Large, DO or Thom Sluder, PA-C or Glendia Ferrier, PA-C

## 2024-07-12 NOTE — Progress Notes (Signed)
 "   Evelyn Baker  HEMATOLOGY CLINIC NOTE   PATIENT NAME: Evelyn Baker   MR#: [id number] DOB: 11-Dec-1960  DATE OF SERVICE: 07/12/2024  Patient Care Team: Evelyn Pfeiffer, PA-C as PCP - General (Family Medicine) Evelyn Richardson, DO as PCP - Cardiology (Cardiology)   REASON FOR VISIT/ CHIEF COMPLAINT:  Anemia.  Patient refuses blood products for reasons of conscience   ASSESSMENT & PLAN:  Evelyn Baker is a 63 y.o. 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    Normocytic anemia Chronic, persistent normocytic anemia with hemoglobin at 6.8 g/dL, significantly below her baseline of 11-12 g/dL since December 7974. No evidence of active hemorrhage. Etiology is multifactorial, likely due to chronic kidney disease and underlying connective tissue disease.   Anemia precludes planned kidney biopsy, as hemoglobin must be =9 g/dL to minimize procedural risk.  Elevated erythropoietin  levels preclude further erythropoietin -stimulating agents. Current focus is on iron  repletion to optimize hemoglobin prior to biopsy. - Ordered IV iron  infusions with Feraheme, once weekly for 2 doses. - Instructed her to continue oral iron  supplementation twice daily. - Scheduled follow-up in two weeks with CBC only to minimize phlebotomy. - Deferred additional erythropoietin -stimulating agents due to elevated endogenous levels. - We will coordinate with nephrology to schedule kidney biopsy once hemoglobin is consistently >9 g/dL. - Provided anticipatory guidance regarding the need to maintain hemoglobin >9 g/dL for safe biopsy.  No evidence of ongoing hemolysis-LDH, haptoglobin were within normal limits and Coombs test negative on 06/14/2024.  She tolerates oral iron , though constipation is managed. No overt blood loss or symptoms of acute anemia.   -  Advised monitoring for constipation; use Senokot as needed and MiraLAX daily if regular bowel movements are not achieved.  - Continue folic acid  supplementation  - Advised to report any signs of bleeding, including hematochezia, melena, or epistaxis.  RTC in 2 weeks for follow-up with repeat labs.   Venous thromboembolism Venous thromboembolism with recent interruption of anticoagulation due to anemia. No current symptoms of clot progression or bleeding. Hemoglobin is improving and anticoagulation with Eliquis  was restarted from 06/14/2024. - Instructed to monitor for and report any signs of bleeding, including hematochezia, melena, epistaxis, or unusual bruising. - Persistent lower extremity swelling and weakness are attributed to the thrombus. No symptoms of pulmonary embolism. She tolerates anticoagulation without bleeding. Diuretics are ineffective for thrombotic edema. - Continued apixaban  twice daily for anticoagulation. - Held chlorthalidone  due to hypotension and lack of efficacy for thrombotic edema. - Recommended leg elevation to reduce swelling. - Scheduled follow-up in two weeks to reassess symptoms and management.  Chronic kidney disease Renal function is fluctuating but currently well-managed. Dr.Peeples thinks that she may have lupus nephritis or other autoimmune related glomerulonephritis.  Kidney biopsy is pending improvement in her hemoglobin to at least 9 and above.  Pericardial effusion - She has pericardial effusion without tamponade.  Established with cardiology.  Currently on colchicine  and steroid taper. - Pericardiocentesis is deferred because of anemia issues.  I reviewed lab results and outside records for this visit and discussed relevant results with the patient. Diagnosis, plan of care and treatment options were also discussed in detail with the patient. Opportunity provided to ask questions and answers provided to her apparent satisfaction. Provided  instructions to call our clinic with any problems, questions or concerns prior to return visit. I recommended to continue  follow-up with PCP and sub-specialists. She verbalized understanding and agreed with the plan. No barriers to learning was detected.  Evelyn Patten, MD  07/12/2024 4:08 PM  [facility] - A DEPT OF [facility] [id number] [place] [address] Dept: [phone] Dept Fax: [phone]   HISTORY OF PRESENT ILLNESS:  Discussed the use of AI scribe software for clinical note transcription with the patient, who gave verbal consent to proceed.  History of Present Illness  Evelyn Baker is a 64 year old female with chronic normocytic anemia secondary to chronic kidney disease and connective tissue disease who presents for hematology/oncology follow-up to manage persistent anemia and coordinate care for a planned kidney biopsy.  Anemia has been persistent since December 2025, with hemoglobin declining from a baseline of 12 g/dL in April 2025 to a nadir of 6-7 g/dL in December 7974. The most recent hemoglobin was 6.8 g/dL as of yesterday, which is stable for her but remains below the threshold required for a planned kidney biopsy. She continues oral iron  supplementation twice daily and previously received IV iron  infusions during a recent hospitalization. She denies any recent bleeding. Erythropoietin  levels are elevated, so additional booster injections are not indicated. She is awaiting insurance approval for further IV iron  infusions, with the goal of increasing hemoglobin to at least 9 g/dL prior to biopsy.  Chronic kidney disease is managed in conjunction with nephrology. She has proteinuria and borderline renal function, with ongoing evaluation for possible lupus nephritis. Prior rheumatology workup indicated Sjogren's syndrome and rheumatoid arthritis, but not lupus. She underwent an ultrasound and  echocardiogram yesterday as part of her workup. Kidney biopsy will be scheduled once hemoglobin is consistently above 9 g/dL.  She is currently taking Eliquis  twice daily for anticoagulation, with no missed doses, and is also on colchicine  and prednisone  for cardiac involvement, as well as amlodipine , Vidal, and Coreg  for cardiac management. She denies chest pain or dyspnea today. She is scheduled to see rheumatology on July 25, 2024, for further evaluation of her connective tissue disease.  EPO was increased at 27 (range 2.6-18.5) on 07/01/2024   SUMMARY OF HEMATOLOGY HISTORY:  64 y.o. lady who is a Jehovah's Witness, presented to the hospital on 06/03/2024 with complaints of left lower extremity pain and swelling.  Imaging indicative of DVT.  She was found to have iron  deficiency anemia with hemoglobin of 6.8, iron  decreased at 24, iron  saturation decreased at 14%.  We were consulted during that hospitalization for additional recommendations, since she cannot take blood products.   She was given IV iron , B12 and folic acid  supplementation and EPO.   She has not previously been diagnosed with anemia, but her family noted longstanding cold intolerance and fatigue, with worsening symptoms since 2020. She describes persistent cold sensation regardless of season, requiring extra clothing, and significant fatigue, somnolence, and decreased activity, including falling asleep quickly in the car and reduced driving.   She denied chest pain, hemoptysis, or overt bleeding, including melena or epistaxis. She experiences mild cough and discomfort when lying flat, which resolves when sitting up or when the room is less stuffy. She endorses occasional dyspnea, particularly when doors are closed, and intermittent dizziness and lightheadedness. She denies abdominal pain or changes in bowel habits. Colonoscopy ten years ago was normal; repeat is due in 2026.   Provided dietary counseling to increase iron  intake  (green leafy vegetables, red meat, liver) and vitamin C supplementation to enhance absorption.  -  Advised to report symptoms of worsening anemia (chest pain, dyspnea, dizziness, palpitations). - Deferred invasive GI evaluation until hemoglobin improves, but will pursue if ongoing or unexplained blood loss is suspected. - Coordinate with care team to minimize phlebotomy blood loss (pediatric tubes, minimal draws).  She established with our clinic on 06/14/2024.  Hemoglobin was stable at 7.8.  She was advised to continue iron  supplements with ferrous sulfate  325 mg p.o. 3 times daily or at least twice daily as tolerated.  MEDICAL HISTORY Past Medical History:  Diagnosis Date   Asthma    Depression    Dyspnea    Hyperlipidemia    Hypertension    Hypothyroidism    Sciatic nerve disease    Thyroid  disease      SURGICAL HISTORY Past Surgical History:  Procedure Laterality Date   FOOT SURGERY Left    STRABISMUS SURGERY     age 15   TOTAL ABDOMINAL HYSTERECTOMY     VEIN LIGATION AND STRIPPING Left 01/22/2021   Procedure: LEFT GREATER SAPHENOUS VEIN  LIGATION AND STRIPPING;  Surgeon: Harvey Carlin BRAVO, MD;  Location: [facility] OR;  Service: Vascular;  Laterality: Left;     SOCIAL HISTORY: She reports that she has never smoked. She has never been exposed to tobacco smoke. She has never used smokeless tobacco. She reports that she does not drink alcohol and does not use drugs. Social History   Socioeconomic History   Marital status: Married    Spouse name: Not on file   Number of children: 4   Years of education: Not on file   Highest education level: Not on file  Occupational History   Not on file  Tobacco Use   Smoking status: Never    Passive exposure: Never   Smokeless tobacco: Never  Vaping Use   Vaping status: Never Used  Substance and Sexual Activity   Alcohol use: No   Drug use: No   Sexual activity: Yes    Birth control/protection: Surgical    Comment: partial  hysterectomy  Other Topics Concern   Not on file  Social History Narrative   Not on file   Social Drivers of Health   Tobacco Use: Low Risk (07/12/2024)   Patient History    Smoking Tobacco Use: Never    Smokeless Tobacco Use: Never    Passive Exposure: Never  Financial Resource Strain: Not on file  Food Insecurity: No Food Insecurity (06/28/2024)   Epic    Worried About Programme Researcher, Broadcasting/film/video in the Last Year: Never true    Ran Out of Food in the Last Year: Never true  Transportation Needs: No Transportation Needs (06/28/2024)   Epic    Lack of Transportation (Medical): No    Lack of Transportation (Non-Medical): No  Physical Activity: Not on file  Stress: Not on file  Social Connections: Socially Integrated (06/28/2024)   Social Connection and Isolation Panel    Frequency of Communication with Friends and Family: More than three times a week    Frequency of Social Gatherings with Friends and Family: More than three times a week    Attends Religious Services: More than 4 times per year    Active Member of [employer] or Organizations: Yes    Attends Banker Meetings: More than 4 times per year    Marital Status: Married  Catering Manager Violence: Not At Risk (06/28/2024)   Epic    Fear of Current or Ex-Partner: No    Emotionally Abused: No  Physically Abused: No    Sexually Abused: No  Depression (PHQ2-9): Low Risk (07/12/2024)   Depression (PHQ2-9)    PHQ-2 Score: 1  Alcohol Screen: Not on file  Housing: Low Risk (06/28/2024)   Epic    Unable to Pay for Housing in the Last Year: No    Number of Times Moved in the Last Year: 0    Homeless in the Last Year: No  Utilities: Not At Risk (06/28/2024)   Epic    Threatened with loss of utilities: No  Health Literacy: Not on file    FAMILY HISTORY: Her family history includes Aneurysm in her sister; Heart Problems in her brother; Heart failure in her mother; Hypertension in her daughter and son; Kidney failure in  her mother; Stroke in her sister.  CURRENT MEDICATIONS   Current Outpatient Medications  Medication Instructions   Acetaminophen  Extra Strength 1,000 mg, Oral, Every 8 hours PRN   albuterol  (VENTOLIN  HFA) 108 (90 Base) MCG/ACT inhaler 2 puffs, Every 4 hours PRN   amLODipine  (NORVASC ) 10 mg, Oral, Daily   azathioprine  (IMURAN ) 100 MG tablet Take 1 tablet (100 mg total) by mouth daily as directed   B-12 1,000 mcg, Oral, Daily   budesonide -formoterol  (SYMBICORT ) 160-4.5 MCG/ACT inhaler 2 puffs, Inhalation, 2 times daily   carvedilol  (COREG ) 6.25 mg, Oral, 2 times daily with meals   colchicine  0.6 mg, Oral, 2 times daily   Eliquis  5 mg, Oral, 2 times daily   EPINEPHrine  0.3 mg/0.3 mL IJ SOAJ injection Inject 0.3 mg into the muscle as needed as directed.   FeroSul 325 mg, Oral, 3 times daily with meals   folic acid  (FOLVITE ) 1 mg, Oral, Daily   furosemide  (LASIX ) 20 mg, Oral, Daily   hydrALAZINE  (APRESOLINE ) 25 MG tablet Take 1 tablet (25 mg) once daily as needed for systolic blood pressure > 170   isosorbide -hydrALAZINE  (BIDIL ) 20-37.5 MG tablet 2 tablets, Oral, 3 times daily   levothyroxine  (SYNTHROID ) 112 MCG tablet Take 1 tablet (112 mcg total) by mouth in the morning on an empty stomach.   ondansetron  (ZOFRAN ) 4 mg, Oral, Every 6 hours PRN   pantoprazole  (PROTONIX ) 40 mg, Oral, 2 times daily   potassium chloride  (MICRO-K ) 10 MEQ CR capsule 10 mEq, Oral, 2 times daily   predniSONE  (DELTASONE ) 10 MG tablet Take 4 tablets (40 mg total) by mouth daily with breakfast for 14 days, THEN 3 tablets (30 mg total) daily with breakfast for 14 days, THEN 2 tablets (20 mg total) daily with breakfast for 14 days, THEN 1 tablet (10 mg total) daily with breakfast for 14 days. then stop.   rosuvastatin  (CRESTOR ) 10 mg, Oral, Daily   senna-docusate (SENOKOT-S) 8.6-50 MG tablet 1 tablet, Oral, At bedtime PRN   vitamin C 100 mg, Daily     ALLERGIES  She is allergic to shellfish allergy, avocado, codeine ,  iodinated contrast media, mushroom, pineapple, and penicillins.  REVIEW OF SYSTEMS:  Review of Systems - Oncology   Rest of the pertinent review of systems is unremarkable except as mentioned above in HPI.  PHYSICAL EXAMINATION:    Onc Performance Status - 07/12/24 1541       ECOG Perf Status   ECOG Perf Status Ambulatory and capable of all selfcare but unable to carry out any work activities.  Up and about more than 50% of waking hours      KPS SCALE   KPS % SCORE Normal activity with effort, some s/s of disease  Vitals:   07/12/24 1554 07/12/24 1607  BP: (!) 144/78 135/79  Pulse: 92   Resp: 16   Temp: 98.6 F (37 C)   SpO2: 100%    Filed Weights   07/12/24 1554  Weight: 194 lb 3.2 oz (88.1 kg)   Physical Exam Constitutional:      General: She is not in acute distress.    Appearance: Normal appearance.  HENT:     Head: Normocephalic and atraumatic.  Cardiovascular:     Rate and Rhythm: Normal rate.  Pulmonary:     Effort: Pulmonary effort is normal. No respiratory distress.  Abdominal:     General: There is no distension.  Neurological:     General: No focal deficit present.     Mental Status: She is alert and oriented to person, place, and time.  Psychiatric:        Mood and Affect: Mood normal.        Behavior: Behavior normal.      LABORATORY DATA:   I have reviewed the data as listed.  Results for orders placed or performed in visit on 07/12/24  D-dimer, quantitative  Result Value Ref Range   D-Dimer, Quant 6.07 (H) 0.00 - 0.50 ug/mL-FEU  Results for orders placed or performed in visit on 07/12/24  Sedimentation rate  Result Value Ref Range   Sed Rate 63 (H) 0 - 40 mm/hr  C-reactive protein  Result Value Ref Range   CRP 50 (H) 0 - 10 mg/L    Hemoglobin was 6.8 on 07/11/2024  RADIOGRAPHIC STUDIES:  I have personally reviewed the radiological images as listed and agreed with the findings in the report.  ECHOCARDIOGRAM  LIMITED Result Date: 07/11/2024    ECHOCARDIOGRAM LIMITED REPORT   Patient Name:   TAKEISHA CIANCI Date of Exam: 07/11/2024 Medical Rec #:  [id number]       Height:       65.0 in Accession #:    [id number]      Weight:       191.3 lb Date of Birth:  23-Oct-1960       BSA:          1.941 m Patient Age:    63 years        BP:           185/90 mmHg Patient Gender: F               HR:           78 bpm. Exam Location:  Outpatient Procedure: 2D Echo and Cardiac Doppler (Both Spectral and Color Flow Doppler            were utilized during procedure). Indications:    Pericardial effusion I31.3  History:        Patient has prior history of Echocardiogram examinations, most                 recent 07/05/2024. Risk Factors:Hypertension.  Sonographer:    Tinnie Gosling RDCS Referring Phys: [id number] BRIAN S CRENSHAW IMPRESSIONS  1. Left ventricular ejection fraction, by estimation, is 60 to 65%. The left ventricle has normal function. The left ventricle has no regional wall motion abnormalities. There is mild asymmetric left ventricular hypertrophy of the basal-septal segment.  2. Right ventricular systolic function is normal. The right ventricular size is normal.  3. There is a moderate to large circumfrential pericardial effsuion which has decreased in size since previous study on 07/06/23. No tamponade. The pericardial  effusion is circumferential. There is no evidence of cardiac tamponade.  4. The mitral valve is grossly normal.  5. The aortic valve is normal in structure.  6. The inferior vena cava is dilated in size with >50% respiratory variability, suggesting right atrial pressure of 8 mmHg. FINDINGS  Left Ventricle: Left ventricular ejection fraction, by estimation, is 60 to 65%. The left ventricle has normal function. The left ventricle has no regional wall motion abnormalities. The left ventricular internal cavity size was normal in size. There is  mild asymmetric left ventricular hypertrophy of the basal-septal segment. Right  Ventricle: The right ventricular size is normal. No increase in right ventricular wall thickness. Right ventricular systolic function is normal. Pericardium: There is a moderate to large circumfrential pericardial effsuion which has decreased in size since previous study on 07/06/23. No tamponade. The pericardial effusion is circumferential. There is no evidence of cardiac tamponade. Mitral Valve: The mitral valve is grossly normal. Tricuspid Valve: The tricuspid valve is grossly normal. Aortic Valve: The aortic valve is normal in structure. Pulmonic Valve: The pulmonic valve was not well visualized. No evidence of pulmonic stenosis. Aorta: The aortic root is normal in size and structure. Venous: The inferior vena cava is dilated in size with greater than 50% respiratory variability, suggesting right atrial pressure of 8 mmHg. IAS/Shunts: No atrial level shunt detected by color flow Doppler. LEFT VENTRICLE PLAX 2D LVIDd:         4.20 cm LVIDs:         2.80 cm LV PW:         1.00 cm LV IVS:        1.00 cm  LV Volumes (MOD) LV vol d, MOD A4C: 99.3 ml LV vol s, MOD A4C: 30.7 ml LV SV MOD A4C:     99.3 ml IVC IVC diam: 2.50 cm Toribio Fuel MD Electronically signed by Toribio Fuel MD Signature Date/Time: 07/11/2024/8:05:14 PM    Final    ECHOCARDIOGRAM LIMITED Result Date: 07/05/2024    ECHOCARDIOGRAM LIMITED REPORT   Patient Name:   LUCITA MONTOYA Date of Exam: 07/05/2024 Medical Rec #:  [id number]       Height:       65.0 in Accession #:    [id number]      Weight:       191.3 lb Date of Birth:  01-17-1961       BSA:          1.941 m Patient Age:    63 years        BP:           162/94 mmHg Patient Gender: F               HR:           101 bpm. Exam Location:  Inpatient Procedure: Limited Echo, Cardiac Doppler and Color Doppler (Both Spectral and            Color Flow Doppler were utilized during procedure). Indications:    I31.3 Pericardial effusion (noninflammatory)  History:        Patient has prior history of  Echocardiogram examinations, most                 recent 07/01/2024. CHF; Stroke. Pulmonary edema. Pericardial                 effision.  Sonographer:    Ellouise Mose RDCS Referring Phys: [id number] BRIAN S CRENSHAW IMPRESSIONS  1. Left ventricular  ejection fraction, by estimation, is 60 to 65%. The left ventricle has normal function. There is moderate asymmetric left ventricular hypertrophy of the septal segment.  2. No right ventricular collapse. Right ventricular systolic function is normal. The right ventricular size is normal. There is normal pulmonary artery systolic pressure.  3. Large pericardial effusion. The pericardial effusion is circumferential. Large pleural effusion in the left lateral region.  4. The aortic valve is tricuspid. There is mild calcification of the aortic valve. Aortic valve sclerosis is present, with no evidence of aortic valve stenosis.  5. The inferior vena cava is dilated in size with >50% respiratory variability, suggesting right atrial pressure of 8 mmHg. Comparison(s): Prior images reviewed side by side. Pericardial effusion is larger. Improvement in respirophasic variation across the mitral valve; persistent variation across the tricuspid valve. No evidence of ventricular or atrial collapse on either study. New tachycardia present on this study, but without IVC plethora. Stable pleural effusion. FINDINGS  Left Ventricle: Left ventricular ejection fraction, by estimation, is 60 to 65%. The left ventricle has normal function. There is moderate asymmetric left ventricular hypertrophy of the septal segment. Right Ventricle: No right ventricular collapse. The right ventricular size is normal. No increase in right ventricular wall thickness. Right ventricular systolic function is normal. There is normal pulmonary artery systolic pressure. The tricuspid regurgitant velocity is 2.15 m/s, and with an assumed right atrial pressure of 8 mmHg, the estimated right ventricular systolic pressure is 26.5  mmHg. Pericardium: A large pericardial effusion is present. The pericardial effusion is circumferential. Tricuspid Valve: The tricuspid valve is normal in structure. Tricuspid valve regurgitation is not demonstrated. No evidence of tricuspid stenosis. Aortic Valve: The aortic valve is tricuspid. There is mild calcification of the aortic valve. Aortic valve sclerosis is present, with no evidence of aortic valve stenosis. Venous: The inferior vena cava is dilated in size with greater than 50% respiratory variability, suggesting right atrial pressure of 8 mmHg. Additional Comments: There is a large pleural effusion in the left lateral region. Spectral Doppler performed. Color Doppler performed.  LEFT VENTRICLE PLAX 2D LVIDd:         3.20 cm LVIDs:         2.30 cm LV PW:         1.20 cm LV IVS:        1.40 cm  LV Volumes (MOD) LV vol d, MOD A2C: 58.7 ml LV vol d, MOD A4C: 55.8 ml LV vol s, MOD A2C: 20.6 ml LV vol s, MOD A4C: 24.6 ml LV SV MOD A2C:     38.1 ml LV SV MOD A4C:     55.8 ml LV SV MOD BP:      34.9 ml IVC IVC diam: 2.10 cm  AORTA Ao Asc diam: 3.20 cm TRICUSPID VALVE TR Peak grad:   18.5 mmHg TR Vmax:        215.00 cm/s Stanly Leavens MD Electronically signed by Stanly Leavens MD Signature Date/Time: 07/05/2024/10:40:36 AM    Final    ECHOCARDIOGRAM LIMITED Result Date: 07/01/2024    ECHOCARDIOGRAM LIMITED REPORT   Patient Name:   KATALENA MALVEAUX Date of Exam: 07/01/2024 Medical Rec #:  [id number]       Height:       65.0 in Accession #:    [id number]      Weight:       193.2 lb Date of Birth:  1961/06/27       BSA:  1.949 m Patient Age:    63 years        BP:           129/78 mmHg Patient Gender: F               HR:           87 bpm. Exam Location:  Inpatient Procedure: Limited Echo, Cardiac Doppler and Color Doppler (Both Spectral and            Color Flow Doppler were utilized during procedure). Indications:    Pericardial effusion I31.3  History:        Patient has prior history of  Echocardiogram examinations, most                 recent 06/29/2024. Risk Factors:Hypertension.  Sonographer:    Jayson Gaskins Referring Phys: [id number] ARUN K THUKKANI IMPRESSIONS  1. Left ventricular ejection fraction, by estimation, is 60 to 65%. The left ventricle has normal function. The left ventricle has no regional wall motion abnormalities.  2. Right ventricular systolic function is normal. The right ventricular size is normal.  3. No evidence of RV diastolic collapse but there is clinically significant respiratory variation across the mitral and tricuspid valves which has worsened since prevoius echo. Additionally effusion is now larger. Discussed with interventional and exam and echo not consistent with overt cardiac tamponade. Would repeat echo in the next 48-72 hours or clinical change. Large pericardial effusion.  4. The mitral valve is normal in structure. No evidence of mitral valve regurgitation. FINDINGS  Left Ventricle: Left ventricular ejection fraction, by estimation, is 60 to 65%. The left ventricle has normal function. The left ventricle has no regional wall motion abnormalities. The left ventricular internal cavity size was normal in size. There is  no left ventricular hypertrophy. Right Ventricle: The right ventricular size is normal. Right vetricular wall thickness was not well visualized. Right ventricular systolic function is normal. Pericardium: No evidence of RV diastolic collapse but there is clinically significant respiratory variation across the mitral and tricuspid valves which has worsened since prevoius echo. Additionally effusion is now larger. Discussed with interventional and exam and echo not consistent with overt cardiac tamponade. Would repeat echo in the next 48-72 hours or clinical change. A large pericardial effusion is present. Mitral Valve: The mitral valve is normal in structure. Tricuspid Valve: The tricuspid valve is normal in structure. Tricuspid valve regurgitation is  not demonstrated. LEFT VENTRICLE PLAX 2D LVIDd:         4.10 cm LVIDs:         2.90 cm LV PW:         1.30 cm LV IVS:        1.00 cm LVOT diam:     1.85 cm LVOT Area:     2.69 cm  LEFT ATRIUM             Index LA Vol (A2C):   35.8 ml 18.37 ml/m LA Vol (A4C):   36.8 ml 18.88 ml/m LA Biplane Vol: 36.6 ml 18.78 ml/m   SHUNTS Systemic Diam: 1.85 cm Morene Brownie Electronically signed by Morene Brownie Signature Date/Time: 07/01/2024/11:03:15 AM    Final    ECHOCARDIOGRAM COMPLETE Result Date: 06/29/2024    ECHOCARDIOGRAM REPORT   Patient Name:   KATHELENE RUMBERGER Date of Exam: 06/29/2024 Medical Rec #:  [id number]       Height:       65.0 in Accession #:    [id number]  Weight:       193.1 lb Date of Birth:  16-Sep-1960       BSA:          1.949 m Patient Age:    63 years        BP:           136/80 mmHg Patient Gender: F               HR:           80 bpm. Exam Location:  Inpatient Procedure: 2D Echo, Cardiac Doppler and Color Doppler (Both Spectral and Color            Flow Doppler were utilized during procedure). Indications:    CHF - Acute Diastolic  History:        Patient has prior history of Echocardiogram examinations, most                 recent 03/17/2023. CHF, CAD, Signs/Symptoms:Edema, Shortness of                 Breath and Dyspnea; Risk Factors:Hypertension and Dyslipidemia.  Sonographer:    Juliene Rucks Referring Phys: [id number] JEFFREY T [facility]  Sonographer Comments: Patient is obese. IMPRESSIONS  1. Large pericardial effusion; IVC not dilated and no RV diastolic collapse to suggest tamponade.  2. Left ventricular ejection fraction, by estimation, is 60 to 65%. The left ventricle has normal function. The left ventricle has no regional wall motion abnormalities. There is mild left ventricular hypertrophy. Left ventricular diastolic parameters are consistent with Grade I diastolic dysfunction (impaired relaxation).  3. Right ventricular systolic function is normal. The right ventricular size is normal.   4. Large pericardial effusion. There is no evidence of cardiac tamponade.  5. The mitral valve is normal in structure. Trivial mitral valve regurgitation. No evidence of mitral stenosis.  6. The aortic valve is tricuspid. Aortic valve regurgitation is not visualized. No aortic stenosis is present.  7. The inferior vena cava is normal in size with greater than 50% respiratory variability, suggesting right atrial pressure of 3 mmHg. FINDINGS  Left Ventricle: Left ventricular ejection fraction, by estimation, is 60 to 65%. The left ventricle has normal function. The left ventricle has no regional wall motion abnormalities. The left ventricular internal cavity size was normal in size. There is  mild left ventricular hypertrophy. Left ventricular diastolic parameters are consistent with Grade I diastolic dysfunction (impaired relaxation). Right Ventricle: The right ventricular size is normal. Right ventricular systolic function is normal. Left Atrium: Left atrial size was normal in size. Right Atrium: Right atrial size was normal in size. Pericardium: A large pericardial effusion is present. There is no evidence of cardiac tamponade. Mitral Valve: The mitral valve is normal in structure. Trivial mitral valve regurgitation. No evidence of mitral valve stenosis. Tricuspid Valve: The tricuspid valve is normal in structure. Tricuspid valve regurgitation is trivial. No evidence of tricuspid stenosis. Aortic Valve: The aortic valve is tricuspid. Aortic valve regurgitation is not visualized. No aortic stenosis is present. Pulmonic Valve: The pulmonic valve was normal in structure. Pulmonic valve regurgitation is trivial. No evidence of pulmonic stenosis. Aorta: The aortic root is normal in size and structure. Venous: The inferior vena cava is normal in size with greater than 50% respiratory variability, suggesting right atrial pressure of 3 mmHg. IAS/Shunts: No atrial level shunt detected by color flow Doppler. Additional  Comments: Large pericardial effusion; IVC not dilated and no RV diastolic collapse to suggest tamponade.  LEFT VENTRICLE PLAX 2D LVIDd:         4.30 cm      Diastology LVIDs:         2.70 cm      LV e' medial:    6.85 cm/s LV PW:         1.20 cm      LV E/e' medial:  16.1 LV IVS:        1.10 cm      LV e' lateral:   6.64 cm/s LVOT diam:     1.90 cm      LV E/e' lateral: 16.6 LV SV:         59 LV SV Index:   30 LVOT Area:     2.84 cm  LV Volumes (MOD) LV vol d, MOD A2C: 128.0 ml LV vol d, MOD A4C: 106.0 ml LV vol s, MOD A2C: 61.3 ml LV vol s, MOD A4C: 45.8 ml LV SV MOD A2C:     66.7 ml LV SV MOD A4C:     106.0 ml LV SV MOD BP:      64.9 ml RIGHT VENTRICLE             IVC RV Basal diam:  2.90 cm     IVC diam: 2.00 cm RV Mid diam:    2.20 cm RV S prime:     13.40 cm/s TAPSE (M-mode): 1.8 cm LEFT ATRIUM             Index        RIGHT ATRIUM           Index LA diam:        2.80 cm 1.44 cm/m   RA Area:     15.80 cm LA Vol (A2C):   47.6 ml 24.42 ml/m  RA Volume:   41.60 ml  21.34 ml/m LA Vol (A4C):   24.2 ml 12.42 ml/m LA Biplane Vol: 35.2 ml 18.06 ml/m  AORTIC VALVE LVOT Vmax:   107.00 cm/s LVOT Vmean:  72.100 cm/s LVOT VTI:    0.207 m  AORTA Ao Root diam: 2.90 cm Ao Asc diam:  2.80 cm MITRAL VALVE                TRICUSPID VALVE MV Area (PHT): 3.72 cm     TR Peak grad:   23.2 mmHg MV Decel Time: 204 msec     TR Vmax:        241.00 cm/s MV E velocity: 110.00 cm/s MV A velocity: 107.00 cm/s  SHUNTS MV E/A ratio:  1.03         Systemic VTI:  0.21 m                             Systemic Diam: 1.90 cm Redell Shallow MD Electronically signed by Redell Shallow MD Signature Date/Time: 06/29/2024/9:10:21 AM    Final    DG Chest 2 View Result Date: 06/27/2024 EXAM: 2 VIEW(S) XRAY OF THE CHEST 06/27/2024 03:49:00 PM COMPARISON: 06/05/2024 CLINICAL HISTORY: chest pain FINDINGS: LUNGS AND PLEURA: Mild pulmonary edema. New bilateral small pleural effusions with associated atelectasis. No pneumothorax. HEART AND MEDIASTINUM:  Mild cardiomegaly. BONES AND SOFT TISSUES: No acute osseous abnormality. IMPRESSION: 1. Mild pulmonary edema with new bilateral small pleural effusions and associated atelectasis. 2. Mild cardiomegaly. Electronically signed by: Greig Pique MD 06/27/2024 06:05 PM EST RP Workstation: [id number]    Orders Placed This Encounter  Procedures   CBC with Differential ([facility] Only)    Standing Status:   Standing    Number of Occurrences:   6    Expiration Date:   07/12/2025    Future Appointments  Date Time Provider Department [facility]  07/19/2024 10:30 AM Geronimo Amel, MD [facility] [address]  07/22/2024 10:00 AM [facility] INFUSION [facility] None  07/25/2024  8:00 AM Dolphus Reiter, MD [facility] None  07/25/2024 11:30 AM Teresa Dorsey BRAVO, RPH [facility] H&V  07/26/2024  2:30 PM [facility] PHLEBOTOMIST [facility] None  07/26/2024  3:00 PM Clorine Swing, Chinita, MD [facility] None  07/29/2024  8:30 AM Loreda Hacker, DPM [facility]  08/08/2024  2:00 PM [facility] 3 [facility] H&V    I spent a total of 42 minutes during this encounter with the patient including review of chart and various tests results, discussions about plan of care and coordination of care plan.  This document was completed utilizing speech recognition software. Grammatical errors, random word insertions, pronoun errors, and incomplete sentences are an occasional consequence of this system due to software limitations, ambient noise, and hardware issues. Any formal questions or concerns about the content, text or information contained within the body of this dictation should be directly addressed to the provider for clarification.  "

## 2024-07-13 ENCOUNTER — Other Ambulatory Visit: Payer: Self-pay | Admitting: Oncology

## 2024-07-13 ENCOUNTER — Telehealth: Payer: Self-pay | Admitting: Oncology

## 2024-07-13 ENCOUNTER — Ambulatory Visit: Payer: Self-pay | Admitting: Cardiology

## 2024-07-13 DIAGNOSIS — D509 Iron deficiency anemia, unspecified: Secondary | ICD-10-CM | POA: Insufficient documentation

## 2024-07-13 LAB — SEDIMENTATION RATE: Sed Rate: 63 mm/h — ABNORMAL HIGH (ref 0–40)

## 2024-07-13 LAB — C-REACTIVE PROTEIN: CRP: 50 mg/L — ABNORMAL HIGH (ref 0–10)

## 2024-07-15 ENCOUNTER — Encounter: Payer: Self-pay | Admitting: Oncology

## 2024-07-15 ENCOUNTER — Ambulatory Visit

## 2024-07-15 ENCOUNTER — Telehealth: Payer: Self-pay

## 2024-07-15 ENCOUNTER — Inpatient Hospital Stay

## 2024-07-15 VITALS — BP 157/86 | HR 83 | Temp 98.1°F | Resp 18

## 2024-07-15 DIAGNOSIS — D509 Iron deficiency anemia, unspecified: Secondary | ICD-10-CM | POA: Diagnosis not present

## 2024-07-15 MED ORDER — SODIUM CHLORIDE 0.9 % IV SOLN: INTRAVENOUS | Status: DC

## 2024-07-15 MED ORDER — ACETAMINOPHEN 325 MG PO TABS
650.0000 mg | ORAL_TABLET | Freq: Once | ORAL | Status: AC
  Administered 2024-07-15: 650 mg via ORAL
  Filled 2024-07-15: qty 2

## 2024-07-15 MED ORDER — SODIUM CHLORIDE 0.9 % IV SOLN
510.0000 mg | Freq: Once | INTRAVENOUS | Status: AC
  Administered 2024-07-15: 510 mg via INTRAVENOUS
  Filled 2024-07-15: qty 510

## 2024-07-15 MED ORDER — DIPHENHYDRAMINE HCL 25 MG PO CAPS
25.0000 mg | ORAL_CAPSULE | Freq: Once | ORAL | Status: AC
  Administered 2024-07-15: 25 mg via ORAL
  Filled 2024-07-15: qty 1

## 2024-07-15 NOTE — Patient Instructions (Signed)

## 2024-07-15 NOTE — Assessment & Plan Note (Addendum)
 Chronic, persistent normocytic anemia with hemoglobin at 6.8 g/dL, significantly below her baseline of 11-12 g/dL since December 7974. No evidence of active hemorrhage. Etiology is multifactorial, likely due to chronic kidney disease and underlying connective tissue disease.   Anemia precludes planned kidney biopsy, as hemoglobin must be =9 g/dL to minimize procedural risk.  Elevated erythropoietin  levels preclude further erythropoietin -stimulating agents. Current focus is on iron  repletion to optimize hemoglobin prior to biopsy. - Ordered IV iron  infusions with Feraheme, once weekly for 2 doses. - Instructed her to continue oral iron  supplementation twice daily. - Scheduled follow-up in two weeks with CBC only to minimize phlebotomy. - Deferred additional erythropoietin -stimulating agents due to elevated endogenous levels. - We will coordinate with nephrology to schedule kidney biopsy once hemoglobin is consistently >9 g/dL. - Provided anticipatory guidance regarding the need to maintain hemoglobin >9 g/dL for safe biopsy.  No evidence of ongoing hemolysis-LDH, haptoglobin were within normal limits and Coombs test negative on 06/14/2024.  She tolerates oral iron , though constipation is managed. No overt blood loss or symptoms of acute anemia.   - Advised monitoring for constipation; use Senokot as needed and MiraLAX daily if regular bowel movements are not achieved.  - Continue folic acid  supplementation  - Advised to report any signs of bleeding, including hematochezia, melena, or epistaxis.  RTC in 2 weeks for follow-up with repeat labs.

## 2024-07-15 NOTE — Telephone Encounter (Signed)
 Called the patient to reschedule her appointment as she was seen by her cardiology provider three days ago, during which amlodipine  10 mg daily was added to her regimen. She states that her blood pressure has improved somewhat, previous SBP readings were in the 180s during her last visit, and are now ranging from the 160s down to occasional readings in the 130s, though still variable.  I encouraged her to continue her current medication regimen and to maintain a blood pressure log twice daily. She was advised to bring both the log and her home blood pressure monitor to our appointment in two weeks for accuracy verification.  She denies chest pain or severe headaches and reports only mild shortness of breath when climbing stairs. ER precautions were reviewed, and she was provided with the clinic phone number in case her blood pressure is not improving following the initiation of amlodipine .

## 2024-07-15 NOTE — Progress Notes (Unsigned)
 Patient ID: Evelyn [facility]                 DOB: May 02, 1961                      MRN: [id number]     HPI: MYKEL MOHL is a 64 y.o. female referred by Glendia Ferrier, PA to pharmacy clinic for HF medication management. 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 Most recent LVEF 60-65% on 06/2024.  Patient was seen by cardiology provider 3 days ago where amlodipine  was restarted...    Once Cr settles, will consider ARB/Entresto;  Jardiance   Discussion with patient today included the following: cardiac medication indications, introduction to GDMT clinic, reasoning behind medication titration, importance of medication adherence, and patient engagement. . At last visit with MD ***. Symptomatically, she is feeling ***, *** dizziness, lightheadedness, and fatigue. *** chest pain or palpitations. Feels SOB when ***. Able to complete all ADLs. Activity level ***. She *** checks her weight at home (normal range *** - *** lbs). *** LEE, PND, or orthopnea. Appetite has been ***. She *** adheres to a low-salt diet.      Current CHF/HTN meds: isosobide dinitrate 20-37.5 mg twice daily three times daily, amlodipine , carvedilol  6.25 mg twice daily  Previously tried: chlorthalidone  and valsartan  previously stopped due to AKI, spironolactone  (did not tolerate?) Adherence Assessment  Do you ever forget to take your medication? [] Yes [] No  Do you ever skip doses due to side effects? [] Yes [] No  Do you have trouble affording your medicines? [] Yes [] No  Are you ever unable to pick up your medication due to transportation difficulties? [] Yes [] No  Do you ever stop taking your medications because you don't believe they are helping? [] Yes [] No  Do you check your weight daily? [] Yes [] No   Adherence strategy: ***  Barriers to obtaining medications: ***  BP goal: < 130/80  Family History:   Social History:  Alcohol: Smoking:  Diet:   Breakfast: Lunch/Dinner: Snacks: Beverages:  Exercise:   Home BP readings:   Wt Readings from Last 3 Encounters:  07/12/24 194 lb 3.2 oz (88.1 kg)  07/12/24 192 lb 9.6 oz (87.4 kg)  07/04/24 191 lb 4.8 oz (86.8 kg)   BP Readings from Last 3 Encounters:  07/12/24 135/79  07/12/24 (!) 182/90  07/06/24 (!) 151/86   Pulse Readings from Last 3 Encounters:  07/12/24 92  07/12/24 79  07/06/24 74    Renal function: Estimated Creatinine Clearance: 41.4 mL/min (A) (by C-G formula based on SCr of 1.55 mg/dL (H)).  Past Medical History:  Diagnosis Date   Asthma    Depression    Dyspnea    Hyperlipidemia    Hypertension    Hypothyroidism    Sciatic nerve disease    Thyroid  disease     Medications Ordered Prior to Encounter[1]  Allergies[2]   Assessment/Plan:  1. CHF -  There are no diagnoses linked to this encounter.     Thank you   Demitria Hay E. Nedda Gains, Pharm.D, CPP [employer] Elspeth JONETTA. [facility] [address], [place], [place] [address] Phone: [phone]; Fax: [phone]     [1]  Current Outpatient Medications on File Prior to Visit  Medication Sig Dispense Refill   acetaminophen  (TYLENOL ) 500 MG tablet Take 2 tablets (1,000 mg total) by mouth every 8 (eight) hours as needed. 30 tablet 0   albuterol  (  VENTOLIN  HFA) 108 (90 Base) MCG/ACT inhaler Inhale 2 puffs into the lungs every 4 (four) hours as needed for wheezing or shortness of breath.     amLODipine  (NORVASC ) 10 MG tablet Take 1 tablet (10 mg total) by mouth daily. 90 tablet 3   apixaban  (ELIQUIS ) 5 MG TABS tablet Take 1 tablet (5 mg total) by mouth 2 (two) times daily. 60 tablet 3   Ascorbic Acid (VITAMIN C) 100 MG tablet Take 100 mg by mouth daily.     azathioprine  (IMURAN ) 100 MG tablet Take 1 tablet (100 mg total) by mouth daily as directed 90 tablet 0   budesonide -formoterol  (SYMBICORT ) 160-4.5 MCG/ACT inhaler Inhale 2 puffs into the lungs 2 (two) times  daily. 10.2 g 12   carvedilol  (COREG ) 6.25 MG tablet Take 1 tablet (6.25 mg total) by mouth 2 (two) times daily with a meal. 60 tablet 0   colchicine  0.6 MG tablet Take 1 tablet (0.6 mg total) by mouth 2 (two) times daily. 60 tablet 0   cyanocobalamin  1000 MCG tablet Take 1 tablet (1,000 mcg total) by mouth daily. 30 tablet 0   EPINEPHrine  0.3 mg/0.3 mL IJ SOAJ injection Inject 0.3 mg into the muscle as needed as directed. 2 each 1   ferrous sulfate  325 (65 FE) MG tablet Take 1 tablet (325 mg total) by mouth 3 (three) times daily with meals. 100 tablet 0   folic acid  (FOLVITE ) 1 MG tablet Take 1 tablet (1 mg total) by mouth daily. 30 tablet 0   furosemide  (LASIX ) 20 MG tablet Take 1 tablet (20 mg total) by mouth daily. 30 tablet 0   hydrALAZINE  (APRESOLINE ) 25 MG tablet Take 1 tablet (25 mg) once daily as needed for systolic blood pressure > 170 (Patient taking differently: Take 25 mg by mouth daily.) 30 tablet 1   isosorbide -hydrALAZINE  (BIDIL ) 20-37.5 MG tablet Take 2 tablets by mouth 3 (three) times daily.     levothyroxine  (SYNTHROID ) 112 MCG tablet Take 1 tablet (112 mcg total) by mouth in the morning on an empty stomach. 30 tablet 5   ondansetron  (ZOFRAN ) 4 MG tablet Take 1 tablet (4 mg total) by mouth every 6 (six) hours as needed for nausea. (Patient not taking: Reported on 07/12/2024) 20 tablet 0   pantoprazole  (PROTONIX ) 40 MG tablet Take 1 tablet (40 mg total) by mouth 2 (two) times daily. 60 tablet 0   potassium chloride  (MICRO-K ) 10 MEQ CR capsule Take 1 capsule (10 mEq total) by mouth 2 (two) times daily. 180 capsule 2   predniSONE  (DELTASONE ) 10 MG tablet Take 4 tablets (40 mg total) by mouth daily with breakfast for 14 days, THEN 3 tablets (30 mg total) daily with breakfast for 14 days, THEN 2 tablets (20 mg total) daily with breakfast for 14 days, THEN 1 tablet (10 mg total) daily with breakfast for 14 days. then stop. 150 tablet 0   rosuvastatin  (CRESTOR ) 10 MG tablet Take 1 tablet  (10 mg total) by mouth daily. 90 tablet 3   senna-docusate (SENOKOT-S) 8.6-50 MG tablet Take 1 tablet by mouth at bedtime as needed for mild constipation. (Patient not taking: Reported on 07/12/2024) 30 tablet 0   No current facility-administered medications on file prior to visit.  [2]  Allergies Allergen Reactions   Shellfish Allergy Anaphylaxis and Swelling   Avocado Swelling    Lips   Codeine  Other (See Comments)    Constipation   Iodinated Contrast Media Other (See Comments)   Mushroom Other (See Comments)  Itchy throat   Pineapple Swelling    Lip swelling    Penicillins Rash    Reaction: Childhood

## 2024-07-17 ENCOUNTER — Other Ambulatory Visit: Payer: Self-pay | Admitting: Oncology

## 2024-07-18 ENCOUNTER — Other Ambulatory Visit: Payer: Self-pay

## 2024-07-18 ENCOUNTER — Other Ambulatory Visit (HOSPITAL_COMMUNITY): Payer: Self-pay

## 2024-07-18 ENCOUNTER — Telehealth: Payer: Self-pay | Admitting: Cardiology

## 2024-07-18 NOTE — Telephone Encounter (Signed)
 Pt c/o BP issue: STAT if pt c/o blurred vision, one-sided weakness or slurred speech.  STAT if BP is GREATER than 180/120 TODAY.  STAT if BP is LESS than 90/60 and SYMPTOMATIC TODAY  1. What is your BP concern? elevated  2. Have you taken any BP medication today?yes  3. What are your last 5 BP readings?166/97, 165/93, 165/90, 163/99,163/87  4. Are you having any other symptoms (ex. Dizziness, headache, blurred vision, passed out)? no

## 2024-07-19 ENCOUNTER — Ambulatory Visit: Admitting: Internal Medicine

## 2024-07-19 ENCOUNTER — Ambulatory Visit

## 2024-07-19 ENCOUNTER — Encounter: Payer: Self-pay | Admitting: Internal Medicine

## 2024-07-19 VITALS — BP 129/96 | HR 106 | Temp 98.2°F | Ht 65.0 in | Wt 192.6 lb

## 2024-07-19 DIAGNOSIS — Z09 Encounter for follow-up examination after completed treatment for conditions other than malignant neoplasm: Secondary | ICD-10-CM

## 2024-07-19 DIAGNOSIS — I3139 Other pericardial effusion (noninflammatory): Secondary | ICD-10-CM

## 2024-07-19 DIAGNOSIS — J90 Pleural effusion, not elsewhere classified: Secondary | ICD-10-CM

## 2024-07-19 DIAGNOSIS — Z862 Personal history of diseases of the blood and blood-forming organs and certain disorders involving the immune mechanism: Secondary | ICD-10-CM

## 2024-07-19 DIAGNOSIS — Z531 Procedure and treatment not carried out because of patient's decision for reasons of belief and group pressure: Secondary | ICD-10-CM | POA: Diagnosis not present

## 2024-07-19 DIAGNOSIS — R5383 Other fatigue: Secondary | ICD-10-CM | POA: Diagnosis not present

## 2024-07-19 DIAGNOSIS — R0609 Other forms of dyspnea: Secondary | ICD-10-CM | POA: Diagnosis not present

## 2024-07-19 DIAGNOSIS — J45909 Unspecified asthma, uncomplicated: Secondary | ICD-10-CM | POA: Diagnosis not present

## 2024-07-19 DIAGNOSIS — Z86718 Personal history of other venous thrombosis and embolism: Secondary | ICD-10-CM | POA: Diagnosis not present

## 2024-07-19 NOTE — Progress Notes (Signed)
 "      OV 09/25/2022  Subjective:  Patient ID: Evelyn Baker, female , DOB: [birth year] , age 64 y.o. , MRN: [id number] , ADDRESS: [address] [place] [address] PCP Katina Pfeiffer, PA-C Patient Care Team: Katina Pfeiffer, PA-C as PCP - General (Family Medicine)  This Provider for this visit: Treatment Team:  Attending Provider: Geronimo Amel, MD    09/25/2022 -   Chief Complaint  Patient presents with   Consult    Lung nodule. Possible ILD     HPI Evelyn Baker 64 y.o. -64 year old female accompanied by her husband.  She is originally from [place] .  Moved to [place] area in 1993.  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  She tells me that she was doing well till around Thanksgiving 2023.  She then started developing problems in the left shin including warmth and discoloration.  She was subsequently diagnosed to have rheumatoid arthritis as the main etiology for the problem.  She has been on Imuran  and prednisone  by Dr. Curt.  She says she is having a lot of flareups but she denies any chronic shortness of breath or cough or wheezing.  She also recollects that end of January 2024 she had influenza that was confirmed.  She had a lot of cough and this ultimately resolved.  Husband also was sick at this time.  Because of her rheumatoid arthritis issues she ended up having a CT scan of the chest abdomen pelvis 08/07/2022.  I personally visualized this and I agree with the radiologist that shows some groundglass opacities particularly in the right lower lobe and left lower lobe.  There is also some in the right upper lobe anterior segment.  This is a CT chest with contrast.  Given the concern of these infiltrates but also because of rheumatoid arthritis she had a follow-up scan 3 weeks later on 09/10/2022.  This was a high-resolution CT chest without contrast that I personally visualized.  A lot of these groundglass opacities have resolved/improved.   In the right upper lobe anterior segment that is now like a healing scar.  Radiologist also feels as widespread bronchiectasis although I am not personally able to appreciate that.  She also has diffuse lymphadenopathy.  She says she had a biopsy for this and the biopsies are benign.  The biopsy report are not available to me.  Currently she not having any shortness of breath cough or wheezing and she is asymptomatic and feeling well.  No B symptoms.  She is worried about ILD.    CT Chest data - HRCT 09/10/22  DDENDUM: As mentioned in the body of the report, there are multiple bilateral axillary and subpectoral lymph nodes (right-greater-than-left). This is nonspecific, but correlation with mammography and physical examination is recommended, as the possibility of malignancy such as breast cancer is not excluded.   These results will be called to the ordering clinician or representative by the Radiologist Assistant, and communication documented in the PACS or [employer].     Electronically Signed   By: Toribio Aye M.D.   On: 09/15/2022 05:47    Addended by Aye Toribio ORN, MD on 09/15/2022  5:49 AM    Study Result  Narrative & Impression  CLINICAL DATA:  64 year old female with history of asthma, shortness of breath and elevated sedimentation rate. Evaluate for interstitial lung disease.   EXAM: CT CHEST WITHOUT CONTRAST   TECHNIQUE: Multidetector CT imaging of the chest was performed following  the standard protocol without intravenous contrast. High resolution imaging of the lungs, as well as inspiratory and expiratory imaging, was performed.   RADIATION DOSE REDUCTION: This exam was performed according to the departmental dose-optimization program which includes automated exposure control, adjustment of the mA and/or kV according to patient size and/or use of iterative reconstruction technique.   COMPARISON:  CT of the chest, abdomen and pelvis 08/07/2022.    FINDINGS: Cardiovascular: Heart size is normal. Trace amount of pericardial fluid and/or thickening, unlikely to be of any hemodynamic significance at this time. No pericardial calcification. There is aortic atherosclerosis, as well as atherosclerosis of the great vessels of the mediastinum and the coronary arteries, including calcified atherosclerotic plaque in the left anterior descending and right coronary arteries.   Mediastinum/Nodes: No pathologically enlarged mediastinal or hilar lymph nodes. Please note that accurate exclusion of hilar adenopathy is limited on noncontrast CT scans. Esophagus is unremarkable in appearance. Numerous prominent axillary and subpectoral lymph nodes bilaterally (right-greater-than-left), largest is on the right side (axial image 24 of series 2) measuring 1.5 cm in short axis, increased compared to the prior study.   Lungs/Pleura: Widespread areas of cylindrical bronchiectasis with thickening of the peribronchovascular interstitium noted in the lungs bilaterally, most severe in the lung bases where there is some regional architectural distortion and some patchy ground-glass attenuation and volume loss. No other generalized regions of ground-glass attenuation, septal thickening, subpleural reticulation or honeycombing are noted. Inspiratory and expiratory imaging demonstrates some mild air trapping indicative of small airways disease. No acute consolidative airspace disease. No pleural effusions. No definite suspicious appearing pulmonary nodules or masses are noted.   Upper Abdomen: Unremarkable.   Musculoskeletal: There are no aggressive appearing lytic or blastic lesions noted in the visualized portions of the skeleton.   IMPRESSION: 1. 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 Further clinical evaluation is recommended. Strictly  speaking, interstitial lung disease is not entirely excluded, but not favored on the basis of today's examination. Repeat high-resolution chest CT should be considered in 12 months to assess for temporal changes in the appearance of the lung parenchyma. 2. Aortic atherosclerosis, in addition to two-vessel coronary artery disease. Please note that although the presence of coronary artery calcium  documents the presence of coronary artery disease, the severity of this disease and any potential stenosis cannot be assessed on this non-gated CT examination. Assessment for potential risk factor modification, dietary therapy or pharmacologic therapy may be warranted, if clinically indicated. 3. Trace volume of pericardial fluid and/or thickening, unlikely of hemodynamic significance at this time.   Aortic Atherosclerosis (ICD10-I70.0).   Electronically Signed: By: Toribio Aye M.D. On: 09/14/2022 12:02       OV 01/13/2023  Subjective:  Patient ID: Evelyn Baker, female , DOB: [birth year] , age 71 y.o. , MRN: [id number] , ADDRESS: [address] [place] [address] PCP Katina Pfeiffer, PA-C Patient Care Team: Katina Pfeiffer, PA-C as PCP - General (Family Medicine)  This Provider for this visit: Treatment Team:  Attending Provider: Geronimo Amel, MD    01/13/2023 -   Chief Complaint  Patient presents with   Follow-up    F/u ct      HPI NIKHITA MENTZEL 64 y.o. -resents with her husband to discuss test results.  She continues to be asymptomatic.  She maintains herself on Symbicort .  She has not had any rheumatoid arthritis flareu Interim Health status: No new  complaints No new medical problems. No new surgeries. No ER visits. No Urgent care visits. No changes to medications.  She had a CT scan of the chest and the right middle lobe bronchiectasis related infiltrates are significantly improved.  The groundglass opacities on the bottom of the lungs is nearly  resolved.  This is my personal independent interpretation.  There are no new issues.  She continues on Imuran .  She is concerned about her coronary artery calcification and small amount of pericardial fluid seen on both scans.  She does not have any chest pain.  I have referred her to Dr. Elmira.  She does not want to be seen at [facility] because of the delays.     CT Chest data from date:12/26/2022  - personally visualized and independently interpreted : Yes - my findings are: Read above  arrative & Impression  CLINICAL DATA:  Possible interstitial lung disease.   EXAM: CT CHEST WITHOUT CONTRAST   TECHNIQUE: Multidetector CT imaging of the chest was performed following the standard protocol without intravenous contrast. High resolution imaging of the lungs, as well as inspiratory and expiratory imaging, was performed.   RADIATION DOSE REDUCTION: This exam was performed according to the departmental dose-optimization program which includes automated exposure control, adjustment of the mA and/or kV according to patient size and/or use of iterative reconstruction technique.   COMPARISON:  09/10/2022, 08/07/2022.   FINDINGS: Cardiovascular: Heart is enlarged. Small amount of pericardial fluid may be physiologic.   Mediastinum/Nodes: 2.3 cm low-attenuation left thyroid  nodule. No pathologically enlarged mediastinal lymph nodes. Hilar regions are difficult to evaluate without IV contrast. Small to borderline enlarged axillary lymph nodes measure up to 1.4 cm on the right, as on 09/10/2022. Esophagus is grossly unremarkable.   Lungs/Pleura: Mild basilar cylindrical bronchiectasis, peribronchovascular nodularity and ground-glass. Negative for subpleural reticulation, traction bronchiectasis/bronchiolectasis, architectural distortion or honeycombing, including on prone imaging. Lungs are otherwise clear. No pleural fluid. Airway is unremarkable. No air trapping.   Upper Abdomen:  Visualized portions of the liver, gallbladder, adrenal glands, kidneys, spleen, pancreas, stomach and bowel are grossly unremarkable. No upper abdominal adenopathy.   Musculoskeletal: Degenerative changes in the spine. No worrisome lytic or sclerotic lesions.   IMPRESSION: 1. No evidence of interstitial lung disease. 2. Bibasilar postinfectious scarring. 3. Small to borderline enlarged axillary lymph nodes, as before. Difficult to exclude a lymphoproliferative disorder. 4. 2.3 cm low-attenuation left thyroid  nodule. Patient underwent thyroid  ultrasound 04/25/2022. No follow-up recommended unless clinically warranted. (Ref: J Am Coll Radiol. 2015 Feb;12(2): 143-50).     Electronically Signed   By: Newell Eke M.D.   On: 12/30/2022 11:49    OV 08/12/2023  Subjective:  Patient ID: Evelyn Baker, female , DOB: June 29, 1961 , age 61 y.o. , MRN: [id number] , ADDRESS: [address] [place] [address] PCP Katina Pfeiffer, PA-C Patient Care Team: Katina Pfeiffer, PA-C as PCP - General (Family Medicine) Michele Richardson, DO as PCP - Cardiology (Cardiology)  This Provider for this visit: Treatment Team:  Attending Provider: Geronimo Amel, MD    08/12/2023 -   Chief Complaint  Patient presents with   Follow-up    Increased SOB since Jan 2025. She gets winded walking upstairs. She is asking for new rx for symbicort .      HPI MARCEY PERSAD 65 y.o. -returns for follow-up.  Presents with her husband.  Husband's not a historian today.  She tells me since January 2025 she is having insidious onset of shortness of breath with  exertion relieved by rest.  This is when she walks around or goes up stairs.  It is relieved by rest.  Any respiratory infection symptoms make it worse but there is no wheezing no chest pain no chest tightness no paroxysmal nocturnal dyspnea no orthopnea no edema no cough.  She feels it is her asthma and she wants her Symbicort  refilled.  However in  the past have not addressed asthma with her.  In the fall 2024 she did have echocardiogram and there is no pericardial effusion but she did have grade 1 diastolic dysfunction.  Follow-up 2024 hemoglobin is normal.  100% REst with HR 79 -> 95% after 200 feet. No desats     OV 04/14/2024  Subjective:  Patient ID: Evelyn Baker, female , DOB: 1960/09/10 , age 68 y.o. , MRN: [id number] , ADDRESS: [address] [place] [address] PCP Katina Pfeiffer, PA-C Patient Care Team: Katina Pfeiffer, PA-C as PCP - General (Family Medicine) Michele Richardson, DO as PCP - Cardiology (Cardiology)  This Provider for this visit: Treatment Team:  Attending Provider: Geronimo Amel, MD    04/14/2024 -   Chief Complaint  Patient presents with   Medical Management of Chronic Issues   Shortness of Breath    She states she had respiratory infection in Sept 2025- had to take keflex  and the cough she had has resolved. Her breathing is doing well today.      HPI LELA MURFIN 64 y.o. -   S: Returns for follow-up.  Presents with her husband.  Not seen since earlier in the year.  Overall doing well but she went on a 10-day cruise returning on March 14, 2024.  [place]  But on the way back from [place] she felt sick with respiratory complaints.  She is back to baseline.  This improved after taking Symbicort .  She uses Symbicort  as needed couple of times a month and she says she uses it exclusively for dyspnea on exertion for stairs or doing treadmill exercise.  It helps her shortness of breath.  She did have spirometry in June 2025 there is restriction but the DLCO is normal.  She had CT scan of the chest there is no lung nodules no ILD.  No pneumonia no emphysema.  Review the labs indicate new onset anemia last month and repeat lab test this month shows is stable.  She will have regular dose flu shot today.  OV 06/15/2024   Referring provider: Katina Pfeiffer,  PA-C  HPI:   64 y.o. woman whom we are seeing in hospital follow-up with anemia, new DVT.  Multiple different hospitalization reviewed.  Multiple prior pulmonary notes from clinic reviewed.  Admitted to hospital fatigue shortness of breath.  Hemoglobin to be in the sixes.  Suspect there is fluid overload.  Got IV Lasix .  Developed AKI.  On CKD.  Chlorthalidone  was held on discharge.  Creatinine was improving at time of discharge.  Chest imaging consistent with pulmonary edema on my review.  Hemoglobin eventually improved to the sevens.  She refuses blood products.  She has had increased congestion since Monday.  Discharged on Monday.  Cough congestion.  Worse from baseline.  In the hospital she was diagnosed with a DVT.  She continues Eliquis .  OV 07/19/2024  Subjective:  Patient ID: Evelyn Baker, female , DOB: 1960/08/04 , age 33 y.o. , MRN: [id number] , ADDRESS: [address] [place] [address] PCP Katina Pfeiffer, PA-C Patient Care Team: Katina,  Charmaine RIGGERS as PCP - General (Family Medicine) Michele Richardson, DO as PCP - Cardiology (Cardiology)  This Provider for this visit: Treatment Team:  Attending Provider: Geronimo Amel, MD   #History of Sjogren's followed by rheumatology [facility] on Imuran   #Shortness of breath on exertion because of asthma [asthma manifested as dyspnea on exertion] -   no evidence of ILD  # Restrictive pulmonary function test due to obesity with normal DLCO  07/19/2024 -   Chief Complaint  Patient presents with   Hospitalization Follow-up    Pt stated she was hospitalized December 5th, 2025- January 7th,2026, pt stated she has congestive heart failure and multiple blood clots  SOB occurs w/ any activity and exertion      HPI QUANIYA DAMAS 64 y.o. -GLORIANNA GOTT is a 64 year old female with Sjogren's and diagnose of asthma [summer 2025 CT did not have any ILD].  She is now here as a posthospital follow-up following multitude of  significant issues that includes DVT in the left lower extremity and course complicated by anemia and pericardial effusion.  She is Jehovah witness and does not accept blood transfusions.   She was hospitalized twice recently, first admission was 06/03/2024 through 06/13/2024 for acute DVT in the left femoral vein associated with acute kidney injury she was also anemic she is on IV iron  and subcutaneous EPO.  Hematology was involved.  She was discharged on Eliquis .  Discharge hemoglobin was 7.8 g% initially for a blood clot in the left leg, which extended to the femur. She was treated with Eliquis , heparin , and iron  infusions due to a hemoglobin level of 6.8.   Subsequently readmitted 06/27/2024 through 07/06/2024 with worsening shortness of breath.  Chest x-ray at admission showed pleural effusions.  Echocardiogram did not show significant pericardial effusion.  Diagnosed with acute pulmonary edema with pericardial effusion.  Treated with Lasix  carvedilol  and slow prednisone  taper that she is still taking and is going to go on for a few months.  Hemoglobin has dropped further.  There is concern of GI bleed she was seen by Dr. Maranda and GI and Dr. Olympia Bale and cardiology.  Since her discharge on January 7th, she feels 60% better but still experiences significant shortness of breath, especially when climbing stairs or performing activities like cooking. She notes fatigue and swelling in the right leg, which is more pronounced than the left, despite taking Lasix . She describes 'pitting edema' in the legs.  She is wearing stockings.  She is currently on Eliquis , prednisone  (four pills a day), and continues to receive iron  infusions weekly since January 16th. She has a history of Sjogren's syndrome, rheumatoid arthritis, and mixed connective tissue disease, which have not previously caused lung fibrosis.  She remains on Imuran   She is not currently using supplemental oxygen. She has upcoming appointments with  a cardiologist on January 26th and a hematologist on January 27th for further evaluation of her conditions.  Simple walking desaturation test pulse ox 100% on room air with heart rate 87 after completing 2 laps [200 feet x 2] lowest pulse ox was 98% with a heart rate of 106.  She did not desaturate.    PFT     Latest Ref Rng & Units 12/21/2023   11:15 AM  PFT Results  FVC-Pre L 2.13   FVC-Predicted Pre % 62   FVC-Post L 2.08   FVC-Predicted Post % 60   Pre FEV1/FVC % % 77   Post FEV1/FCV % %  80   FEV1-Pre L 1.63   FEV1-Predicted Pre % 61   FEV1-Post L 1.66   DLCO uncorrected ml/min/mmHg 16.63   DLCO UNC% % 79   DLCO corrected ml/min/mmHg 16.62   DLCO COR %Predicted % 79   DLVA Predicted % 124   TLC L 4.43   TLC % Predicted % 85   RV % Predicted % 107        LAB RESULTS last 96 hours No results found.       has a past medical history of Asthma, Depression, Dyspnea, Hyperlipidemia, Hypertension, Hypothyroidism, Sciatic nerve disease, and Thyroid  disease.   reports that she has never smoked. She has never been exposed to tobacco smoke. She has never used smokeless tobacco.  Past Surgical History:  Procedure Laterality Date   FOOT SURGERY Left    STRABISMUS SURGERY     age 63   TOTAL ABDOMINAL HYSTERECTOMY     VEIN LIGATION AND STRIPPING Left 01/22/2021   Procedure: LEFT GREATER SAPHENOUS VEIN  LIGATION AND STRIPPING;  Surgeon: Harvey Carlin BRAVO, MD;  Location: [facility] OR;  Service: Vascular;  Laterality: Left;    Allergies[1]  Immunization History  Administered Date(s) Administered   Influenza, Seasonal, Injecte, Preservative Fre 04/14/2024   Influenza-Unspecified 03/30/2014, 07/13/2019, 03/01/2023   Pfizer(Comirnaty)Fall Seasonal Vaccine 12 years and older 03/10/2023    Family History  Problem Relation Age of Onset   Heart failure Mother    Kidney failure Mother    Stroke Sister    Aneurysm Sister    Heart Problems Brother    Hypertension Son     Hypertension Daughter     Current Medications[2]      Objective:   Vitals:   07/19/24 1011 07/19/24 1107 07/19/24 1108 07/19/24 1109  BP: (!) 129/96     Pulse: 89 87 87 (!) 106  Temp: 98.2 F (36.8 C)     TempSrc: Oral     SpO2: 98% 100% 99% 98%  Weight: 192 lb 9.6 oz (87.4 kg)     Height: 5' 5 (1.651 m)       Estimated body mass index is 32.05 kg/m as calculated from the following:   Height as of this encounter: 5' 5 (1.651 m).   Weight as of this encounter: 192 lb 9.6 oz (87.4 kg).  @WEIGHTCHANGE @  [employer]   07/19/24 1011  Weight: 192 lb 9.6 oz (87.4 kg)     Physical Exam   General: No distress. Looks well O2 at rest: no Cane present: no Sitting in wheel chair: no Frail: no Obese: no Neuro: Alert and Oriented x 3. GCS 15. Speech normal Psych: Pleasant Resp:  Barrel Chest - no.  Wheeze - no, Crackles - no, No overt respiratory distress CVS: Normal heart sounds. Murmurs - no Ext: Stigmata of Connective Tissue Disease - no HEENT: Normal upper airway. PEERL +. No post nasal drip        Assessment/     Assessment & Plan Hospital discharge follow-up  Moderate asthma without complication, unspecified whether persistent  Transfusion of blood product refused for religious reason  History of anemia  History of DVT (deep vein thrombosis)  DOE (dyspnea on exertion)  Other fatigue  Pericardial effusion  Pleural effusion  Need to ascertain the status of her anemia and also pericardial effusion but she has upcoming appointments.  Currently she is subjectively better.  She still has residual symptoms.  Doing a blood test today to see her current status of anemia  or D-dimer could paradoxically make her more anemic.  There 5 1 avoid that.  So we will get a chest x-ray and see the status of effusion.  Certainly if the effusions are getting worse then she will need expedited appointment with cardiology.  Otherwise she should just monitor symptoms and  follow-up with her main specialist.  She and her husband were educated about this plan and they are fine with that.  PLAN Patient Instructions  Shortness of breath  ASthma   No evidence of ILD or nodules on CT chest June 2025  Current shortness of breath is not from pulmonary issues such as asthma but I suspect it either physical deconditioning or persistent anemia and/or persistent pericardial effusion  Given the fact you are not worse that suggest to me it is highly unlikely that you have pulmonary embolism going on  PLAN -Start Symbicort  80/4.52 puffs 2 times daily AS NEEDED - You definitely need anemia recheck and pericardial effusion recheck with your hematologist and cardiologist -I do not want to do blood draw today and waste your circulating blood - Instead we will do a chest x-ray and certainly if the effusions they are from December 2025 worse then you might need expedited workup with your cardiologist - Also talk to primary care physician and get a referral for home physical therapy - Keep appointment with a hematologist - Keep appointment with your cardiologist   Sjogren  Plan  Immuran  per Dr Dolphus     Followup - 4-6 months with nurse practitioner    FOLLOWUP    Return for - 4-6 months with nurse practitioner.  ( Level 05 visit E&M 2024: Estb >= 40 min  in  visit type: on-site physical face to visit  in total care time and counseling or/and coordination of care by this undersigned MD - Dr Dorethia Cave. This includes one or more of the following on this same day 07/19/2024: pre-charting, chart review, note writing, documentation discussion of test results, diagnostic or treatment recommendations, prognosis, risks and benefits of management options, instructions, education, compliance or risk-factor reduction. It excludes time spent by the CMA or office staff in the care of the patient. Actual time 40 min)   SIGNATURE    Dr. Dorethia Cave, M.D.,  F.C.C.P,  Pulmonary and Critical Care Medicine Staff Physician, [facility], [facility] Center Director - Interstitial Lung Disease  Program  Pulmonary Fibrosis [facility] at [facility] [place], [place], [address]  Pager: [phone], If no answer or between  15:00h - 7:00h: call [phone] Telephone: [phone]  11:09 AM 07/19/2024     [1]  Allergies Allergen Reactions   Shellfish Allergy Anaphylaxis and Swelling   Avocado Swelling    Lips   Codeine  Other (See Comments)    Constipation   Iodinated Contrast Media Other (See Comments)   Mushroom Other (See Comments)    Itchy throat   Pineapple Swelling    Lip swelling    Penicillins Rash    Reaction: Childhood  [2]  Current Outpatient Medications:    acetaminophen  (TYLENOL ) 500 MG tablet, Take 2 tablets (1,000 mg total) by mouth every 8 (eight) hours as needed., Disp: 30 tablet, Rfl: 0   albuterol  (VENTOLIN  HFA) 108 (90 Base) MCG/ACT inhaler, Inhale 2 puffs into the lungs every 4 (four) hours as needed for wheezing or shortness of breath., Disp: , Rfl:    amLODipine  (NORVASC ) 10 MG tablet, Take 1 tablet (10  mg total) by mouth daily., Disp: 90 tablet, Rfl: 3   apixaban  (ELIQUIS ) 5 MG TABS tablet, Take 1 tablet (5 mg total) by mouth 2 (two) times daily., Disp: 60 tablet, Rfl: 3   Ascorbic Acid (VITAMIN C) 100 MG tablet, Take 100 mg by mouth daily., Disp: , Rfl:    azathioprine  (IMURAN ) 100 MG tablet, Take 1 tablet (100 mg total) by mouth daily as directed, Disp: 90 tablet, Rfl: 0   budesonide -formoterol  (SYMBICORT ) 160-4.5 MCG/ACT inhaler, Inhale 2 puffs into the lungs 2 (two) times daily., Disp: 10.2 g, Rfl: 12   carvedilol  (COREG ) 6.25 MG tablet, Take 1 tablet (6.25 mg total) by mouth 2 (two) times daily with a meal., Disp: 60 tablet, Rfl: 0   colchicine  0.6 MG tablet, Take 1 tablet (0.6 mg total) by mouth 2 (two) times daily., Disp: 60 tablet, Rfl: 0   cyanocobalamin  1000 MCG tablet, Take 1 tablet  (1,000 mcg total) by mouth daily., Disp: 30 tablet, Rfl: 0   EPINEPHrine  0.3 mg/0.3 mL IJ SOAJ injection, Inject 0.3 mg into the muscle as needed as directed., Disp: 2 each, Rfl: 1   ferrous sulfate  325 (65 FE) MG tablet, Take 1 tablet (325 mg total) by mouth 3 (three) times daily with meals., Disp: 100 tablet, Rfl: 0   folic acid  (FOLVITE ) 1 MG tablet, Take 1 tablet (1 mg total) by mouth daily., Disp: 30 tablet, Rfl: 0   furosemide  (LASIX ) 20 MG tablet, Take 1 tablet (20 mg total) by mouth daily., Disp: 30 tablet, Rfl: 0   hydrALAZINE  (APRESOLINE ) 25 MG tablet, Take 1 tablet (25 mg) once daily as needed for systolic blood pressure > 170 (Patient taking differently: Take 25 mg by mouth daily.), Disp: 30 tablet, Rfl: 1   isosorbide -hydrALAZINE  (BIDIL ) 20-37.5 MG tablet, Take 2 tablets by mouth 3 (three) times daily., Disp: , Rfl:    levothyroxine  (SYNTHROID ) 112 MCG tablet, Take 1 tablet (112 mcg total) by mouth in the morning on an empty stomach., Disp: 30 tablet, Rfl: 5   potassium chloride  (MICRO-K ) 10 MEQ CR capsule, Take 1 capsule (10 mEq total) by mouth 2 (two) times daily., Disp: 180 capsule, Rfl: 2   predniSONE  (DELTASONE ) 10 MG tablet, Take 4 tablets (40 mg total) by mouth daily with breakfast for 14 days, THEN 3 tablets (30 mg total) daily with breakfast for 14 days, THEN 2 tablets (20 mg total) daily with breakfast for 14 days, THEN 1 tablet (10 mg total) daily with breakfast for 14 days. then stop., Disp: 150 tablet, Rfl: 0   rosuvastatin  (CRESTOR ) 10 MG tablet, Take 1 tablet (10 mg total) by mouth daily., Disp: 90 tablet, Rfl: 3   ondansetron  (ZOFRAN ) 4 MG tablet, Take 1 tablet (4 mg total) by mouth every 6 (six) hours as needed for nausea. (Patient not taking: Reported on 07/19/2024), Disp: 20 tablet, Rfl: 0   pantoprazole  (PROTONIX ) 40 MG tablet, Take 1 tablet (40 mg total) by mouth 2 (two) times daily. (Patient not taking: Reported on 07/19/2024), Disp: 60 tablet, Rfl: 0   senna-docusate  (SENOKOT-S) 8.6-50 MG tablet, Take 1 tablet by mouth at bedtime as needed for mild constipation. (Patient not taking: Reported on 07/19/2024), Disp: 30 tablet, Rfl: 0  "

## 2024-07-19 NOTE — Progress Notes (Unsigned)
 "  Office Visit Note  Patient: Evelyn Baker             Date of Birth: 27-Dec-1960           MRN: [id number]             PCP: Katina Pfeiffer, PA-C Referring: Katina Pfeiffer, PA-C Visit Date: 07/21/2024 Occupation: Data Unavailable  Subjective:  No chief complaint on file.   History of Present Illness: Evelyn Baker is a 64 y.o. female ***   Patient was discharged from the hospital on July 06, 2024 after being hospitalized for pericardial effusion and pleural effusion.  She was also very anemic with hemoglobin of 6.  I received a phone call from Dr. Pietro as patient was Kalvin witness she could not get any blood transfusion.  She was at risk of pericardial tap because she is on Eliquis .  He placed patient on prednisone  40 mg p.o. daily and colchicine .  According to the discharge summary from July 07, 2023 she was hospitalized with acute on chronic congestive heart failure.  She continues to be on Imuran .  Activities of Daily Living:  Patient reports morning stiffness for *** {minute/hour:19697}.   Patient {ACTIONS;DENIES/REPORTS:21021675::Denies} nocturnal pain.  Difficulty dressing/grooming: {ACTIONS;DENIES/REPORTS:21021675::Denies} Difficulty climbing stairs: {ACTIONS;DENIES/REPORTS:21021675::Denies} Difficulty getting out of chair: {ACTIONS;DENIES/REPORTS:21021675::Denies} Difficulty using hands for taps, buttons, cutlery, and/or writing: {ACTIONS;DENIES/REPORTS:21021675::Denies}  No Rheumatology ROS completed.   PMFS History:  Patient Active Problem List   Diagnosis Date Noted   Iron  deficiency anemia, unspecified 07/13/2024   Acute on chronic heart failure with preserved ejection fraction (HFpEF) (HCC) 07/02/2024   Anemia due to chronic kidney disease 07/01/2024   Pericardial effusion 06/29/2024   Pulmonary edema cardiac cause (HCC) 06/28/2024   Congestive heart failure (HCC) 06/27/2024   Refusal of blood transfusion for reasons of conscience  06/09/2024   AKI (acute kidney injury) 06/03/2024   Acute deep vein thrombosis (DVT) of left femoral vein (HCC) 06/03/2024   Normocytic anemia 06/03/2024   Hypokalemia 06/03/2024   History of CVA (cerebrovascular accident) 06/03/2024   Hypertension    Hypothyroidism    Hyperlipidemia    Asthma    Coronary artery disease involving native coronary artery of native heart without angina pectoris 04/12/2024   Mixed connective tissue disease 10/09/2023   Primary osteoarthritis of both feet 10/09/2023   Pain of left lower leg 07/07/2022   Dyspareunia in female 05/05/2022   Abnormal urine 05/05/2022   Chronic constipation 05/05/2022   Fibrocystic disease of breast 05/05/2022   Obesity 05/05/2022   Reactive depression (situational) 05/05/2022   Chronic venous insufficiency 02/13/2020   Leg edema, left 02/13/2020    Past Medical History:  Diagnosis Date   Asthma    Depression    Dyspnea    Hyperlipidemia    Hypertension    Hypothyroidism    Sciatic nerve disease    Thyroid  disease     Family History  Problem Relation Age of Onset   Heart failure Mother    Kidney failure Mother    Stroke Sister    Aneurysm Sister    Heart Problems Brother    Hypertension Son    Hypertension Daughter    Past Surgical History:  Procedure Laterality Date   FOOT SURGERY Left    STRABISMUS SURGERY     age 75   TOTAL ABDOMINAL HYSTERECTOMY     VEIN LIGATION AND STRIPPING Left 01/22/2021   Procedure: LEFT GREATER SAPHENOUS VEIN  LIGATION AND STRIPPING;  Surgeon: Harvey Dunnings  E, MD;  Location: [facility] OR;  Service: Vascular;  Laterality: Left;   Social History[1] Social History   Social History Narrative   Not on file     Immunization History  Administered Date(s) Administered   Influenza, Seasonal, Injecte, Preservative Fre 04/14/2024   Influenza-Unspecified 03/30/2014, 07/13/2019, 03/01/2023   Pfizer(Comirnaty)Fall Seasonal Vaccine 12 years and older 03/10/2023     Objective: Vital  Signs: There were no vitals taken for this visit.   Physical Exam   Musculoskeletal Exam: ***  CDAI Exam: CDAI Score: -- Patient Global: --; Provider Global: -- Swollen: --; Tender: -- Joint Exam 07/21/2024   No joint exam has been documented for this visit   There is currently no information documented on the homunculus. Go to the Rheumatology activity and complete the homunculus joint exam.  Investigation: No additional findings.  Imaging: ECHOCARDIOGRAM LIMITED Result Date: 07/11/2024    ECHOCARDIOGRAM LIMITED REPORT   Patient Name:   KLEIGH HOELZER Date of Exam: 07/11/2024 Medical Rec #:  [id number]       Height:       65.0 in Accession #:    [id number]      Weight:       191.3 lb Date of Birth:  [birth year]       BSA:          1.941 m Patient Age:    63 years        BP:           185/90 mmHg Patient Gender: F               HR:           78 bpm. Exam Location:  Outpatient Procedure: 2D Echo and Cardiac Doppler (Both Spectral and Color Flow Doppler            were utilized during procedure). Indications:    Pericardial effusion I31.3  History:        Patient has prior history of Echocardiogram examinations, most                 recent 07/05/2024. Risk Factors:Hypertension.  Sonographer:    Tinnie Gosling RDCS Referring Phys: [id number] BRIAN S CRENSHAW IMPRESSIONS  1. Left ventricular ejection fraction, by estimation, is 60 to 65%. The left ventricle has normal function. The left ventricle has no regional wall motion abnormalities. There is mild asymmetric left ventricular hypertrophy of the basal-septal segment.  2. Right ventricular systolic function is normal. The right ventricular size is normal.  3. There is a moderate to large circumfrential pericardial effsuion which has decreased in size since previous study on 07/06/23. No tamponade. The pericardial effusion is circumferential. There is no evidence of cardiac tamponade.  4. The mitral valve is grossly normal.  5. The aortic valve is normal in  structure.  6. The inferior vena cava is dilated in size with >50% respiratory variability, suggesting right atrial pressure of 8 mmHg. FINDINGS  Left Ventricle: Left ventricular ejection fraction, by estimation, is 60 to 65%. The left ventricle has normal function. The left ventricle has no regional wall motion abnormalities. The left ventricular internal cavity size was normal in size. There is  mild asymmetric left ventricular hypertrophy of the basal-septal segment. Right Ventricle: The right ventricular size is normal. No increase in right ventricular wall thickness. Right ventricular systolic function is normal. Pericardium: There is a moderate to large circumfrential pericardial effsuion which has decreased in size since previous study on 07/06/23. No tamponade.  The pericardial effusion is circumferential. There is no evidence of cardiac tamponade. Mitral Valve: The mitral valve is grossly normal. Tricuspid Valve: The tricuspid valve is grossly normal. Aortic Valve: The aortic valve is normal in structure. Pulmonic Valve: The pulmonic valve was not well visualized. No evidence of pulmonic stenosis. Aorta: The aortic root is normal in size and structure. Venous: The inferior vena cava is dilated in size with greater than 50% respiratory variability, suggesting right atrial pressure of 8 mmHg. IAS/Shunts: No atrial level shunt detected by color flow Doppler. LEFT VENTRICLE PLAX 2D LVIDd:         4.20 cm LVIDs:         2.80 cm LV PW:         1.00 cm LV IVS:        1.00 cm  LV Volumes (MOD) LV vol d, MOD A4C: 99.3 ml LV vol s, MOD A4C: 30.7 ml LV SV MOD A4C:     99.3 ml IVC IVC diam: 2.50 cm Toribio Fuel MD Electronically signed by Toribio Fuel MD Signature Date/Time: 07/11/2024/8:05:14 PM    Final    ECHOCARDIOGRAM LIMITED Result Date: 07/05/2024    ECHOCARDIOGRAM LIMITED REPORT   Patient Name:   ZANNAH MELUCCI Date of Exam: 07/05/2024 Medical Rec #:  [id number]       Height:       65.0 in Accession #:     [id number]      Weight:       191.3 lb Date of Birth:  Jan 25, 1961       BSA:          1.941 m Patient Age:    63 years        BP:           162/94 mmHg Patient Gender: F               HR:           101 bpm. Exam Location:  Inpatient Procedure: Limited Echo, Cardiac Doppler and Color Doppler (Both Spectral and            Color Flow Doppler were utilized during procedure). Indications:    I31.3 Pericardial effusion (noninflammatory)  History:        Patient has prior history of Echocardiogram examinations, most                 recent 07/01/2024. CHF; Stroke. Pulmonary edema. Pericardial                 effision.  Sonographer:    Ellouise Mose RDCS Referring Phys: [id number] BRIAN S CRENSHAW IMPRESSIONS  1. Left ventricular ejection fraction, by estimation, is 60 to 65%. The left ventricle has normal function. There is moderate asymmetric left ventricular hypertrophy of the septal segment.  2. No right ventricular collapse. Right ventricular systolic function is normal. The right ventricular size is normal. There is normal pulmonary artery systolic pressure.  3. Large pericardial effusion. The pericardial effusion is circumferential. Large pleural effusion in the left lateral region.  4. The aortic valve is tricuspid. There is mild calcification of the aortic valve. Aortic valve sclerosis is present, with no evidence of aortic valve stenosis.  5. The inferior vena cava is dilated in size with >50% respiratory variability, suggesting right atrial pressure of 8 mmHg. Comparison(s): Prior images reviewed side by side. Pericardial effusion is larger. Improvement in respirophasic variation across the mitral valve; persistent variation across the tricuspid valve. No evidence  of ventricular or atrial collapse on either study. New tachycardia present on this study, but without IVC plethora. Stable pleural effusion. FINDINGS  Left Ventricle: Left ventricular ejection fraction, by estimation, is 60 to 65%. The left ventricle has normal  function. There is moderate asymmetric left ventricular hypertrophy of the septal segment. Right Ventricle: No right ventricular collapse. The right ventricular size is normal. No increase in right ventricular wall thickness. Right ventricular systolic function is normal. There is normal pulmonary artery systolic pressure. The tricuspid regurgitant velocity is 2.15 m/s, and with an assumed right atrial pressure of 8 mmHg, the estimated right ventricular systolic pressure is 26.5 mmHg. Pericardium: A large pericardial effusion is present. The pericardial effusion is circumferential. Tricuspid Valve: The tricuspid valve is normal in structure. Tricuspid valve regurgitation is not demonstrated. No evidence of tricuspid stenosis. Aortic Valve: The aortic valve is tricuspid. There is mild calcification of the aortic valve. Aortic valve sclerosis is present, with no evidence of aortic valve stenosis. Venous: The inferior vena cava is dilated in size with greater than 50% respiratory variability, suggesting right atrial pressure of 8 mmHg. Additional Comments: There is a large pleural effusion in the left lateral region. Spectral Doppler performed. Color Doppler performed.  LEFT VENTRICLE PLAX 2D LVIDd:         3.20 cm LVIDs:         2.30 cm LV PW:         1.20 cm LV IVS:        1.40 cm  LV Volumes (MOD) LV vol d, MOD A2C: 58.7 ml LV vol d, MOD A4C: 55.8 ml LV vol s, MOD A2C: 20.6 ml LV vol s, MOD A4C: 24.6 ml LV SV MOD A2C:     38.1 ml LV SV MOD A4C:     55.8 ml LV SV MOD BP:      34.9 ml IVC IVC diam: 2.10 cm  AORTA Ao Asc diam: 3.20 cm TRICUSPID VALVE TR Peak grad:   18.5 mmHg TR Vmax:        215.00 cm/s Stanly Leavens MD Electronically signed by Stanly Leavens MD Signature Date/Time: 07/05/2024/10:40:36 AM    Final    ECHOCARDIOGRAM LIMITED Result Date: 07/01/2024    ECHOCARDIOGRAM LIMITED REPORT   Patient Name:   TOYNA ERISMAN Date of Exam: 07/01/2024 Medical Rec #:  [id number]       Height:       65.0 in  Accession #:    [id number]      Weight:       193.2 lb Date of Birth:  04-22-1961       BSA:          1.949 m Patient Age:    63 years        BP:           129/78 mmHg Patient Gender: F               HR:           87 bpm. Exam Location:  Inpatient Procedure: Limited Echo, Cardiac Doppler and Color Doppler (Both Spectral and            Color Flow Doppler were utilized during procedure). Indications:    Pericardial effusion I31.3  History:        Patient has prior history of Echocardiogram examinations, most                 recent 06/29/2024. Risk Factors:Hypertension.  Sonographer:  Jayson Gaskins Referring Phys: [id number] ARUN K THUKKANI IMPRESSIONS  1. Left ventricular ejection fraction, by estimation, is 60 to 65%. The left ventricle has normal function. The left ventricle has no regional wall motion abnormalities.  2. Right ventricular systolic function is normal. The right ventricular size is normal.  3. No evidence of RV diastolic collapse but there is clinically significant respiratory variation across the mitral and tricuspid valves which has worsened since prevoius echo. Additionally effusion is now larger. Discussed with interventional and exam and echo not consistent with overt cardiac tamponade. Would repeat echo in the next 48-72 hours or clinical change. Large pericardial effusion.  4. The mitral valve is normal in structure. No evidence of mitral valve regurgitation. FINDINGS  Left Ventricle: Left ventricular ejection fraction, by estimation, is 60 to 65%. The left ventricle has normal function. The left ventricle has no regional wall motion abnormalities. The left ventricular internal cavity size was normal in size. There is  no left ventricular hypertrophy. Right Ventricle: The right ventricular size is normal. Right vetricular wall thickness was not well visualized. Right ventricular systolic function is normal. Pericardium: No evidence of RV diastolic collapse but there is clinically significant  respiratory variation across the mitral and tricuspid valves which has worsened since prevoius echo. Additionally effusion is now larger. Discussed with interventional and exam and echo not consistent with overt cardiac tamponade. Would repeat echo in the next 48-72 hours or clinical change. A large pericardial effusion is present. Mitral Valve: The mitral valve is normal in structure. Tricuspid Valve: The tricuspid valve is normal in structure. Tricuspid valve regurgitation is not demonstrated. LEFT VENTRICLE PLAX 2D LVIDd:         4.10 cm LVIDs:         2.90 cm LV PW:         1.30 cm LV IVS:        1.00 cm LVOT diam:     1.85 cm LVOT Area:     2.69 cm  LEFT ATRIUM             Index LA Vol (A2C):   35.8 ml 18.37 ml/m LA Vol (A4C):   36.8 ml 18.88 ml/m LA Biplane Vol: 36.6 ml 18.78 ml/m   SHUNTS Systemic Diam: 1.85 cm Morene Brownie Electronically signed by Morene Brownie Signature Date/Time: 07/01/2024/11:03:15 AM    Final    ECHOCARDIOGRAM COMPLETE Result Date: 06/29/2024    ECHOCARDIOGRAM REPORT   Patient Name:   AZARYA OCONNELL Date of Exam: 06/29/2024 Medical Rec #:  [id number]       Height:       65.0 in Accession #:    [id number]      Weight:       193.1 lb Date of Birth:  [birth year]       BSA:          1.949 m Patient Age:    63 years        BP:           136/80 mmHg Patient Gender: F               HR:           80 bpm. Exam Location:  Inpatient Procedure: 2D Echo, Cardiac Doppler and Color Doppler (Both Spectral and Color            Flow Doppler were utilized during procedure). Indications:    CHF - Acute Diastolic  History:  Patient has prior history of Echocardiogram examinations, most                 recent 03/17/2023. CHF, CAD, Signs/Symptoms:Edema, Shortness of                 Breath and Dyspnea; Risk Factors:Hypertension and Dyslipidemia.  Sonographer:    Juliene Rucks Referring Phys: [id number] JEFFREY T [facility]  Sonographer Comments: Patient is obese. IMPRESSIONS  1. Large pericardial effusion;  IVC not dilated and no RV diastolic collapse to suggest tamponade.  2. Left ventricular ejection fraction, by estimation, is 60 to 65%. The left ventricle has normal function. The left ventricle has no regional wall motion abnormalities. There is mild left ventricular hypertrophy. Left ventricular diastolic parameters are consistent with Grade I diastolic dysfunction (impaired relaxation).  3. Right ventricular systolic function is normal. The right ventricular size is normal.  4. Large pericardial effusion. There is no evidence of cardiac tamponade.  5. The mitral valve is normal in structure. Trivial mitral valve regurgitation. No evidence of mitral stenosis.  6. The aortic valve is tricuspid. Aortic valve regurgitation is not visualized. No aortic stenosis is present.  7. The inferior vena cava is normal in size with greater than 50% respiratory variability, suggesting right atrial pressure of 3 mmHg. FINDINGS  Left Ventricle: Left ventricular ejection fraction, by estimation, is 60 to 65%. The left ventricle has normal function. The left ventricle has no regional wall motion abnormalities. The left ventricular internal cavity size was normal in size. There is  mild left ventricular hypertrophy. Left ventricular diastolic parameters are consistent with Grade I diastolic dysfunction (impaired relaxation). Right Ventricle: The right ventricular size is normal. Right ventricular systolic function is normal. Left Atrium: Left atrial size was normal in size. Right Atrium: Right atrial size was normal in size. Pericardium: A large pericardial effusion is present. There is no evidence of cardiac tamponade. Mitral Valve: The mitral valve is normal in structure. Trivial mitral valve regurgitation. No evidence of mitral valve stenosis. Tricuspid Valve: The tricuspid valve is normal in structure. Tricuspid valve regurgitation is trivial. No evidence of tricuspid stenosis. Aortic Valve: The aortic valve is tricuspid. Aortic  valve regurgitation is not visualized. No aortic stenosis is present. Pulmonic Valve: The pulmonic valve was normal in structure. Pulmonic valve regurgitation is trivial. No evidence of pulmonic stenosis. Aorta: The aortic root is normal in size and structure. Venous: The inferior vena cava is normal in size with greater than 50% respiratory variability, suggesting right atrial pressure of 3 mmHg. IAS/Shunts: No atrial level shunt detected by color flow Doppler. Additional Comments: Large pericardial effusion; IVC not dilated and no RV diastolic collapse to suggest tamponade.  LEFT VENTRICLE PLAX 2D LVIDd:         4.30 cm      Diastology LVIDs:         2.70 cm      LV e' medial:    6.85 cm/s LV PW:         1.20 cm      LV E/e' medial:  16.1 LV IVS:        1.10 cm      LV e' lateral:   6.64 cm/s LVOT diam:     1.90 cm      LV E/e' lateral: 16.6 LV SV:         59 LV SV Index:   30 LVOT Area:     2.84 cm  LV Volumes (MOD) LV vol  d, MOD A2C: 128.0 ml LV vol d, MOD A4C: 106.0 ml LV vol s, MOD A2C: 61.3 ml LV vol s, MOD A4C: 45.8 ml LV SV MOD A2C:     66.7 ml LV SV MOD A4C:     106.0 ml LV SV MOD BP:      64.9 ml RIGHT VENTRICLE             IVC RV Basal diam:  2.90 cm     IVC diam: 2.00 cm RV Mid diam:    2.20 cm RV S prime:     13.40 cm/s TAPSE (M-mode): 1.8 cm LEFT ATRIUM             Index        RIGHT ATRIUM           Index LA diam:        2.80 cm 1.44 cm/m   RA Area:     15.80 cm LA Vol (A2C):   47.6 ml 24.42 ml/m  RA Volume:   41.60 ml  21.34 ml/m LA Vol (A4C):   24.2 ml 12.42 ml/m LA Biplane Vol: 35.2 ml 18.06 ml/m  AORTIC VALVE LVOT Vmax:   107.00 cm/s LVOT Vmean:  72.100 cm/s LVOT VTI:    0.207 m  AORTA Ao Root diam: 2.90 cm Ao Asc diam:  2.80 cm MITRAL VALVE                TRICUSPID VALVE MV Area (PHT): 3.72 cm     TR Peak grad:   23.2 mmHg MV Decel Time: 204 msec     TR Vmax:        241.00 cm/s MV E velocity: 110.00 cm/s MV A velocity: 107.00 cm/s  SHUNTS MV E/A ratio:  1.03         Systemic VTI:  0.21 m                              Systemic Diam: 1.90 cm Redell Shallow MD Electronically signed by Redell Shallow MD Signature Date/Time: 06/29/2024/9:10:21 AM    Final    DG Chest 2 View Result Date: 06/27/2024 EXAM: 2 VIEW(S) XRAY OF THE CHEST 06/27/2024 03:49:00 PM COMPARISON: 06/05/2024 CLINICAL HISTORY: chest pain FINDINGS: LUNGS AND PLEURA: Mild pulmonary edema. New bilateral small pleural effusions with associated atelectasis. No pneumothorax. HEART AND MEDIASTINUM: Mild cardiomegaly. BONES AND SOFT TISSUES: No acute osseous abnormality. IMPRESSION: 1. Mild pulmonary edema with new bilateral small pleural effusions and associated atelectasis. 2. Mild cardiomegaly. Electronically signed by: Greig Pique MD 06/27/2024 06:05 PM EST RP Workstation: [id number]    Recent Labs: Lab Results  Component Value Date   WBC 5.4 07/06/2024   HGB 6.4 (LL) 07/06/2024   PLT 265 07/06/2024   NA 133 (L) 07/05/2024   K 3.6 07/05/2024   CL 96 (L) 07/05/2024   CO2 27 07/05/2024   GLUCOSE 87 07/05/2024   BUN 18 07/05/2024   CREATININE 1.55 (H) 07/05/2024   BILITOT 0.3 06/29/2024   ALKPHOS 73 06/29/2024   AST 74 (H) 06/29/2024   ALT 51 (H) 06/29/2024   PROT 6.6 06/29/2024   ALBUMIN  2.7 (L) 06/29/2024   CALCIUM  8.3 (L) 07/05/2024    Speciality Comments: No specialty comments available.  Procedures:  No procedures performed Allergies: Shellfish allergy, Avocado, Codeine , Iodinated contrast media, Mushroom, Pineapple, and Penicillins   Assessment / Plan:     Visit Diagnoses: No diagnosis found.  Orders: No orders of the defined types were placed in this encounter.  No orders of the defined types were placed in this encounter.   Face-to-face time spent with patient was *** minutes. Greater than 50% of time was spent in counseling and coordination of care.  Follow-Up Instructions: No follow-ups on file.   Daved JAYSON Gavel, CMA  Note - This record has been created using Animal nutritionist.  Chart  creation errors have been sought, but may not always  have been located. Such creation errors do not reflect on  the standard of medical care.      [1]  Social History Tobacco Use   Smoking status: Never    Passive exposure: Never   Smokeless tobacco: Never  Vaping Use   Vaping status: Never Used  Substance Use Topics   Alcohol use: No   Drug use: No   "

## 2024-07-19 NOTE — Patient Instructions (Addendum)
 Shortness of breath  ASthma   No evidence of ILD or nodules on CT chest June 2025  Current shortness of breath is not from pulmonary issues such as asthma but I suspect it either physical deconditioning or persistent anemia and/or persistent pericardial effusion  Given the fact you are not worse that suggest to me it is highly unlikely that you have pulmonary embolism going on  PLAN -Cotninue Symbicort  80/4.52 puffs 2 times daily AS NEEDED - You definitely need anemia recheck and pericardial effusion recheck with your hematologist and cardiologist -I do not want to do blood draw today and waste your circulating blood - Do a chest x-ray and certainly if the effusions they are from December 2025 worse then you might need expedited workup with your cardiologist - Also talk to primary care physician and get a referral for home physical therapy - Keep appointment with a hematologist - Keep appointment with your cardiologist   Sjogren  Plan  Immuran  per Dr Dolphus     Followup - 4-6 months with nurse practitioner

## 2024-07-19 NOTE — Telephone Encounter (Signed)
 Called patient at 10:35 AM, she states she was at appt with pulmonologist and requested callback in about 30 minutes.  Called and left message for patient just now to discuss elevated BP readings and current BP medication dosages. Encouraged patient to keep appt with Pharm D on 07/25/2024.

## 2024-07-20 ENCOUNTER — Other Ambulatory Visit: Payer: Self-pay

## 2024-07-20 ENCOUNTER — Other Ambulatory Visit (HOSPITAL_COMMUNITY): Payer: Self-pay

## 2024-07-20 ENCOUNTER — Encounter: Payer: Self-pay | Admitting: Cardiology

## 2024-07-20 ENCOUNTER — Other Ambulatory Visit: Payer: Self-pay | Admitting: Cardiology

## 2024-07-20 ENCOUNTER — Encounter: Payer: Self-pay | Admitting: Oncology

## 2024-07-20 DIAGNOSIS — I2510 Atherosclerotic heart disease of native coronary artery without angina pectoris: Secondary | ICD-10-CM

## 2024-07-20 MED ORDER — FOLIC ACID 1 MG PO TABS
1.0000 mg | ORAL_TABLET | Freq: Every day | ORAL | 0 refills | Status: AC
  Filled 2024-07-20: qty 30, 30d supply, fill #0

## 2024-07-21 ENCOUNTER — Encounter: Payer: Self-pay | Admitting: Rheumatology

## 2024-07-21 ENCOUNTER — Other Ambulatory Visit (HOSPITAL_COMMUNITY): Payer: Self-pay

## 2024-07-21 ENCOUNTER — Encounter (HOSPITAL_COMMUNITY): Payer: Self-pay

## 2024-07-21 ENCOUNTER — Other Ambulatory Visit: Payer: Self-pay | Admitting: Cardiology

## 2024-07-21 ENCOUNTER — Ambulatory Visit: Attending: Rheumatology | Admitting: Rheumatology

## 2024-07-21 VITALS — BP 176/102 | HR 89 | Temp 98.7°F | Resp 17 | Ht 65.0 in | Wt 190.4 lb

## 2024-07-21 DIAGNOSIS — M19071 Primary osteoarthritis, right ankle and foot: Secondary | ICD-10-CM | POA: Insufficient documentation

## 2024-07-21 DIAGNOSIS — M545 Low back pain, unspecified: Secondary | ICD-10-CM | POA: Diagnosis not present

## 2024-07-21 DIAGNOSIS — Z8261 Family history of arthritis: Secondary | ICD-10-CM | POA: Diagnosis present

## 2024-07-21 DIAGNOSIS — M79641 Pain in right hand: Secondary | ICD-10-CM | POA: Diagnosis not present

## 2024-07-21 DIAGNOSIS — M25562 Pain in left knee: Secondary | ICD-10-CM | POA: Insufficient documentation

## 2024-07-21 DIAGNOSIS — M25561 Pain in right knee: Secondary | ICD-10-CM | POA: Diagnosis not present

## 2024-07-21 DIAGNOSIS — E785 Hyperlipidemia, unspecified: Secondary | ICD-10-CM | POA: Insufficient documentation

## 2024-07-21 DIAGNOSIS — Z7901 Long term (current) use of anticoagulants: Secondary | ICD-10-CM | POA: Insufficient documentation

## 2024-07-21 DIAGNOSIS — M7072 Other bursitis of hip, left hip: Secondary | ICD-10-CM | POA: Insufficient documentation

## 2024-07-21 DIAGNOSIS — H35033 Hypertensive retinopathy, bilateral: Secondary | ICD-10-CM | POA: Diagnosis present

## 2024-07-21 DIAGNOSIS — Z79899 Other long term (current) drug therapy: Secondary | ICD-10-CM | POA: Insufficient documentation

## 2024-07-21 DIAGNOSIS — M19072 Primary osteoarthritis, left ankle and foot: Secondary | ICD-10-CM | POA: Insufficient documentation

## 2024-07-21 DIAGNOSIS — D508 Other iron deficiency anemias: Secondary | ICD-10-CM | POA: Insufficient documentation

## 2024-07-21 DIAGNOSIS — M351 Other overlap syndromes: Secondary | ICD-10-CM | POA: Diagnosis not present

## 2024-07-21 DIAGNOSIS — M25572 Pain in left ankle and joints of left foot: Secondary | ICD-10-CM | POA: Diagnosis not present

## 2024-07-21 DIAGNOSIS — Z8709 Personal history of other diseases of the respiratory system: Secondary | ICD-10-CM | POA: Insufficient documentation

## 2024-07-21 DIAGNOSIS — J90 Pleural effusion, not elsewhere classified: Secondary | ICD-10-CM | POA: Insufficient documentation

## 2024-07-21 DIAGNOSIS — I872 Venous insufficiency (chronic) (peripheral): Secondary | ICD-10-CM | POA: Insufficient documentation

## 2024-07-21 DIAGNOSIS — R400 Somnolence: Secondary | ICD-10-CM | POA: Insufficient documentation

## 2024-07-21 DIAGNOSIS — I6381 Other cerebral infarction due to occlusion or stenosis of small artery: Secondary | ICD-10-CM | POA: Diagnosis present

## 2024-07-21 DIAGNOSIS — E038 Other specified hypothyroidism: Secondary | ICD-10-CM | POA: Diagnosis present

## 2024-07-21 DIAGNOSIS — I10 Essential (primary) hypertension: Secondary | ICD-10-CM | POA: Insufficient documentation

## 2024-07-21 DIAGNOSIS — M79642 Pain in left hand: Secondary | ICD-10-CM | POA: Diagnosis present

## 2024-07-21 DIAGNOSIS — I3139 Other pericardial effusion (noninflammatory): Secondary | ICD-10-CM | POA: Insufficient documentation

## 2024-07-21 DIAGNOSIS — F329 Major depressive disorder, single episode, unspecified: Secondary | ICD-10-CM | POA: Insufficient documentation

## 2024-07-21 DIAGNOSIS — Z86718 Personal history of other venous thrombosis and embolism: Secondary | ICD-10-CM | POA: Insufficient documentation

## 2024-07-21 DIAGNOSIS — G8929 Other chronic pain: Secondary | ICD-10-CM | POA: Diagnosis present

## 2024-07-21 DIAGNOSIS — K219 Gastro-esophageal reflux disease without esophagitis: Secondary | ICD-10-CM | POA: Diagnosis present

## 2024-07-21 MED ORDER — MYCOPHENOLATE MOFETIL 500 MG PO TABS
ORAL_TABLET | ORAL | 0 refills | Status: AC
  Filled 2024-07-21: qty 84, 28d supply, fill #0

## 2024-07-21 MED ORDER — CARVEDILOL 6.25 MG PO TABS
6.2500 mg | ORAL_TABLET | Freq: Two times a day (BID) | ORAL | 2 refills | Status: AC
  Filled 2024-07-21: qty 180, 90d supply, fill #0

## 2024-07-21 NOTE — Patient Instructions (Addendum)
 Please decrease the dose of Imuran  to 50 mg tablet, 1 tablet daily for 1 week and then discontinue. Start CellCept  500 mg, 1 tablet twice a day for 2 weeks and then increase the dose to 2 tablets by mouth twice a day if the labs are normal. Please follow prednisone  taper and colchicine  as prescribed by your cardiologist.  Standing Labs We placed an order today for your standing lab work.   Please have your standing labs drawn in 2 weeks x 2 and then in 1 month and then every 2 months  Please have your labs drawn 2 weeks prior to your appointment so that the provider can discuss your lab results at your appointment, if possible.  Please note that you may see your imaging and lab results in MyChart before we have reviewed them. We will contact you once all results are reviewed. Please allow our office up to 72 hours to thoroughly review all of the results before contacting the office for clarification of your results.  WALK-IN LAB HOURS  Monday through Thursday from 8:00 am - 4:30 pm and Friday from 8:00 am-12:00 pm.  Patients with office visits requiring labs will be seen before walk-in labs.  You may encounter longer than normal wait times. Please allow additional time. Wait times may be shorter on  Monday and Thursday afternoons.  We do not book appointments for walk-in labs. We appreciate your patience and understanding with our staff.   Labs are drawn by [facility]. Please bring your co-pay at the time of your lab draw.  You may receive a bill from [facility] for your lab work.  Please note if you are on Hydroxychloroquine and and an order has been placed for a Hydroxychloroquine level,  you will need to have it drawn 4 hours or more after your last dose.  If you wish to have your labs drawn at another location, please call the office 24 hours in advance so we can fax the orders.  The office is located at [address], [address], [place], [place] [address]   If you have any questions  regarding directions or hours of operation,  please call [phone].   As a reminder, please drink plenty of water prior to coming for your lab work. Thanks!   Vaccines You are taking a medication(s) that can suppress your immune system.  The following immunizations are recommended: Flu annually Covid-19  Td/Tdap (tetanus, diphtheria, pertussis) every 10 years Pneumonia (Prevnar 15 then Pneumovax 23 at least 1 year apart.  Alternatively, can take Prevnar 20 without needing additional dose) Shingrix: 2 doses from 4 weeks to 6 months apart  Please check with your PCP to make sure you are up to date.   Mycophenolate  Capsules What is this medication? MYCOPHENOLATE  (mye koe FEN oh late) prevents the body from rejecting an organ transplant. It works by lowering the body's immune system response. This helps the body accept the donor organ. It belongs to a group of medications called immunosuppressants. This medicine may be used for other purposes; ask your health care provider or pharmacist if you have questions. COMMON BRAND NAME(S): CellCept  What should I tell my care team before I take this medication? They need to know if you have any of these conditions: Anemia Blood disorder Cancer Diarrhea Immune system problems Infection, such as chickenpox, cold sores, herpes Kidney disease Recent or upcoming vaccine Stomach problems An unusual or allergic reaction to mycophenolate , other medications, foods, dyes, or preservatives Pregnant or trying to  get pregnant Breastfeeding How should I use this medication? Take this medication by mouth with a full glass of water. Follow the directions on the prescription label. Take this medication on an empty stomach, at least 1 hour before or 2 hours after food. Do not take with food unless your care team approves. Swallow the medication whole. Do not cut, crush, or chew the medication. If the medication is broken or is not intact, do not get the powder  on your skin or eyes. If contact occurs, rinse thoroughly with water. Take your medication at regular intervals. Do not take your medication more often than directed. Do not stop taking except on your care team's advice. A special MedGuide will be given to you by the pharmacist with each prescription and refill. Be sure to read this information carefully each time. Talk to your care team about the use of this medication in children. While this medication may be prescribed for selected conditions, precautions do apply. Overdosage: If you think you have taken too much of this medicine contact a poison control center or emergency room at once. NOTE: This medicine is only for you. Do not share this medicine with others. What if I miss a dose? If you miss a dose, take it as soon as you can. If it is almost time for your next dose, take only that dose. Do not take double or extra doses. What may interact with this medication? Do not take this medication with any of the following: Live virus vaccines This medication may also interact with the following: Acyclovir or valacyclovir Azathioprine  Certain antibiotics, such as ciprofloxacin, levofloxacin, norfloxacin, trimethoprim; sulfamethoxazole, penicillin, amoxicillin ; clavulanic acid Certain medications for stomach problems, such as lansoprazole, omeprazole , pantoprazole  Cyclosporine Estrogen and progestin hormones Ganciclovir or valganciclovir Isavuconazonium Medications for cholesterol, such as cholestyramine or colestipol Metronidazole Other medications that contain mycophenolate  Probenecid Rifampin Sevelamer Stomach acid blockers, such as magnesium  hydroxide or aluminum hydroxide Telmisartan This list may not describe all possible interactions. Give your health care provider a list of all the medicines, herbs, non-prescription drugs, or dietary supplements you use. Also tell them if you smoke, drink alcohol, or use illegal drugs. Some items  may interact with your medicine. What should I watch for while using this medication? Visit your care team for regular checks on your progress. You will need frequent blood checks during the first few months you are receiving the medication. This medication may affect your coordination, reaction time, or judgment. Do not drive or operate machinery until you know how this medication affects you. Sit up or stand slowly to reduce the risk of dizzy or fainting spells. Drinking alcohol with this medication can increase the risk of these side effects. This medication may increase your risk of getting an infection. Call your care team for advice if you get a fever, chills, sore throat, or other symptoms of a cold or flu. Do not treat yourself. Try to avoid being around people who are sick. This medication can make you more sensitive to the sun. Keep out of the sun. If you cannot avoid being in the sun, wear protective clothing and sunscreen. Do not use sun lamps, tanning beds, or tanning booths. Talk to your care team about your risk of cancer. You may be more at risk for certain types of cancer if you take this medication. Talk to your care team if you or your partner may be pregnant. Serious birth defects can occur if you take this medication during pregnancy  and for 6 weeks after the last dose. You will need a negative pregnancy test before starting this medication. Estrogen and progestin hormones may not work as well while you are taking this medication. Contraception is recommended while taking this medication and for 6 weeks after the last dose. Your care team can help you find the option that works for you. If your partner can get pregnant, use a condom during sex while taking this medication and for 90 days after the last dose. Talk to your care team before breastfeeding. Changes to your treatment plan may be needed. Do not donate sperm while taking this medication and for 90 days after the last dose. Do  not donate blood while you are taking this medication and for 6 weeks after the last dose. Donated blood may contain enough of this medication to cause birth defects in a fetus if transfused to someone who is pregnant. What side effects may I notice from receiving this medication? Side effects that you should report to your care team as soon as possible: Allergic reactions--skin rash, itching, hives, swelling of the face, lips, tongue, or throat Infection--fever, chills, cough, sore throat, wounds that don't heal, pain or trouble when passing urine, general feeling of discomfort or being unwell Joint, muscle, or tendon pain, swelling, or stiffness Low red blood cell level--unusual weakness or fatigue, dizziness, headache, trouble breathing Peptic ulcer--burning stomach pain, loss of appetite, bloating, burping, heartburn, nausea, vomiting Stomach bleeding--bloody or black, tar-like stools, vomiting blood or brown material that looks like coffee grounds Stomach pain that is severe, does not go away, or gets worse Unusual bruising or bleeding Side effects that usually do not require medical attention (report to your care team if they continue or are bothersome): Diarrhea Dizziness Headache Nausea Swelling of the ankles, hands, or feet Tremors or shaking Trouble sleeping This list may not describe all possible side effects. Call your doctor for medical advice about side effects. You may report side effects to FDA at 1-800-FDA-1088. Where should I keep my medication? Keep out of the reach of children and pets. Store at room temperature between 20 and 25 degrees C (68 and 77 degrees F). Get rid of any unused medication after the expiration date. To get rid of medications that are no longer needed or have expired: Take the medication to a medication take-back program. Check with your pharmacy or law enforcement to find a location. If you cannot return the medication, ask your pharmacist or care  team how to get rid of this medication safely. NOTE: This sheet is a summary. It may not cover all possible information. If you have questions about this medicine, talk to your doctor, pharmacist, or health care provider.  2024 Elsevier/Gold Standard (2022-04-24 00:00:00)

## 2024-07-22 ENCOUNTER — Encounter: Payer: Self-pay | Admitting: Cardiology

## 2024-07-22 ENCOUNTER — Other Ambulatory Visit (HOSPITAL_BASED_OUTPATIENT_CLINIC_OR_DEPARTMENT_OTHER): Payer: Self-pay

## 2024-07-22 ENCOUNTER — Emergency Department (HOSPITAL_COMMUNITY)
Admission: EM | Admit: 2024-07-22 | Discharge: 2024-07-22 | Disposition: A | Discharge status: Home / Self Care | Attending: Emergency Medicine | Admitting: Emergency Medicine

## 2024-07-22 ENCOUNTER — Other Ambulatory Visit (HOSPITAL_COMMUNITY): Payer: Self-pay

## 2024-07-22 ENCOUNTER — Ambulatory Visit: Payer: Self-pay | Admitting: Rheumatology

## 2024-07-22 ENCOUNTER — Inpatient Hospital Stay

## 2024-07-22 VITALS — BP 166/96 | HR 77 | Temp 97.9°F | Resp 18

## 2024-07-22 DIAGNOSIS — K921 Melena: Secondary | ICD-10-CM | POA: Diagnosis present

## 2024-07-22 DIAGNOSIS — Z7901 Long term (current) use of anticoagulants: Secondary | ICD-10-CM | POA: Diagnosis not present

## 2024-07-22 DIAGNOSIS — K644 Residual hemorrhoidal skin tags: Secondary | ICD-10-CM | POA: Diagnosis not present

## 2024-07-22 DIAGNOSIS — I509 Heart failure, unspecified: Secondary | ICD-10-CM | POA: Insufficient documentation

## 2024-07-22 DIAGNOSIS — Z79899 Other long term (current) drug therapy: Secondary | ICD-10-CM | POA: Diagnosis not present

## 2024-07-22 DIAGNOSIS — K649 Unspecified hemorrhoids: Secondary | ICD-10-CM

## 2024-07-22 DIAGNOSIS — Z8673 Personal history of transient ischemic attack (TIA), and cerebral infarction without residual deficits: Secondary | ICD-10-CM | POA: Diagnosis not present

## 2024-07-22 DIAGNOSIS — Z86718 Personal history of other venous thrombosis and embolism: Secondary | ICD-10-CM | POA: Diagnosis not present

## 2024-07-22 DIAGNOSIS — D509 Iron deficiency anemia, unspecified: Secondary | ICD-10-CM

## 2024-07-22 DIAGNOSIS — I110 Hypertensive heart disease with heart failure: Secondary | ICD-10-CM | POA: Insufficient documentation

## 2024-07-22 DIAGNOSIS — I2510 Atherosclerotic heart disease of native coronary artery without angina pectoris: Secondary | ICD-10-CM | POA: Insufficient documentation

## 2024-07-22 LAB — CBC WITH DIFFERENTIAL/PLATELET
Abs Immature Granulocytes: 0.05 K/uL (ref 0.00–0.07)
Basophils Absolute: 0 K/uL (ref 0.0–0.1)
Basophils Relative: 0 %
Eosinophils Absolute: 0.1 K/uL (ref 0.0–0.5)
Eosinophils Relative: 1 %
HCT: 28.4 % — ABNORMAL LOW (ref 36.0–46.0)
Hemoglobin: 9.7 g/dL — ABNORMAL LOW (ref 12.0–15.0)
Immature Granulocytes: 1 %
Lymphocytes Relative: 15 %
Lymphs Abs: 1 K/uL (ref 0.7–4.0)
MCH: 28.4 pg (ref 26.0–34.0)
MCHC: 34.2 g/dL (ref 30.0–36.0)
MCV: 83.3 fL (ref 80.0–100.0)
Monocytes Absolute: 0.6 K/uL (ref 0.1–1.0)
Monocytes Relative: 9 %
Neutro Abs: 4.8 K/uL (ref 1.7–7.7)
Neutrophils Relative %: 74 %
Platelets: 228 K/uL (ref 150–400)
RBC: 3.41 MIL/uL — ABNORMAL LOW (ref 3.87–5.11)
RDW: 25.9 % — ABNORMAL HIGH (ref 11.5–15.5)
Smear Review: NORMAL
WBC: 6.5 K/uL (ref 4.0–10.5)
nRBC: 0 % (ref 0.0–0.2)

## 2024-07-22 LAB — CBC
HCT: 26 % — ABNORMAL LOW (ref 36.0–46.0)
Hemoglobin: 9.1 g/dL — ABNORMAL LOW (ref 12.0–15.0)
MCH: 28.4 pg (ref 26.0–34.0)
MCHC: 35 g/dL (ref 30.0–36.0)
MCV: 81.3 fL (ref 80.0–100.0)
Platelets: 220 K/uL (ref 150–400)
RBC: 3.2 MIL/uL — ABNORMAL LOW (ref 3.87–5.11)
RDW: 25.7 % — ABNORMAL HIGH (ref 11.5–15.5)
WBC: 5.5 K/uL (ref 4.0–10.5)
nRBC: 0 % (ref 0.0–0.2)

## 2024-07-22 LAB — COMPREHENSIVE METABOLIC PANEL WITH GFR
ALT: 26 U/L (ref 0–44)
AST: 25 U/L (ref 15–41)
Albumin: 3.4 g/dL — ABNORMAL LOW (ref 3.5–5.0)
Alkaline Phosphatase: 98 U/L (ref 38–126)
Anion gap: 9 (ref 5–15)
BUN: 19 mg/dL (ref 8–23)
CO2: 29 mmol/L (ref 22–32)
Calcium: 9.3 mg/dL (ref 8.9–10.3)
Chloride: 99 mmol/L (ref 98–111)
Creatinine, Ser: 0.9 mg/dL (ref 0.44–1.00)
GFR, Estimated: >60 mL/min
Glucose, Bld: 84 mg/dL (ref 70–99)
Potassium: 3.6 mmol/L (ref 3.5–5.1)
Sodium: 137 mmol/L (ref 135–145)
Total Bilirubin: 0.5 mg/dL (ref 0.0–1.2)
Total Protein: 6.9 g/dL (ref 6.5–8.1)

## 2024-07-22 LAB — PROTIME-INR
INR: 1.1 (ref 0.8–1.2)
Prothrombin Time: 15.2 s (ref 11.4–15.2)

## 2024-07-22 LAB — APTT: aPTT: 28 s (ref 24–36)

## 2024-07-22 MED ORDER — SODIUM CHLORIDE 0.9 % IV SOLN: INTRAVENOUS | Status: DC

## 2024-07-22 MED ORDER — COLCHICINE 0.6 MG PO TABS
0.6000 mg | ORAL_TABLET | Freq: Two times a day (BID) | ORAL | 0 refills | Status: AC
  Filled 2024-07-22 – 2024-07-30 (×3): qty 60, 30d supply, fill #0

## 2024-07-22 MED ORDER — PANTOPRAZOLE SODIUM 40 MG IV SOLR
40.0000 mg | Freq: Once | INTRAVENOUS | Status: AC
  Administered 2024-07-22: 40 mg via INTRAVENOUS
  Filled 2024-07-22: qty 10

## 2024-07-22 MED ORDER — DIPHENHYDRAMINE HCL 25 MG PO CAPS
25.0000 mg | ORAL_CAPSULE | Freq: Once | ORAL | Status: AC
  Administered 2024-07-22: 25 mg via ORAL
  Filled 2024-07-22: qty 1

## 2024-07-22 MED ORDER — SODIUM CHLORIDE 0.9 % IV SOLN
510.0000 mg | Freq: Once | INTRAVENOUS | Status: AC
  Administered 2024-07-22: 510 mg via INTRAVENOUS
  Filled 2024-07-22: qty 17

## 2024-07-22 MED ORDER — ACETAMINOPHEN 325 MG PO TABS
650.0000 mg | ORAL_TABLET | Freq: Once | ORAL | Status: AC
  Administered 2024-07-22: 650 mg via ORAL
  Filled 2024-07-22: qty 2

## 2024-07-22 NOTE — Progress Notes (Signed)
 I have deferred to her rheumatology provider - as she is on medical tx with them. I spoke to her over the phone today.   Dr. Layli Capshaw

## 2024-07-22 NOTE — Telephone Encounter (Signed)
 Spoke to the patient over the phone.  She was in the ED - due to bleeding per rectum.  Labs reviewed from today.  Will defer bleeding workup to ED.  Refilled colchicine .  She will follow up with rheumatology for management of her MCT and etc.  She will follow up with hematology for DVT workup ?unprovoked  Please refer her to pharmD clinic for BP management.   Dr. Raquell Richer

## 2024-07-22 NOTE — Discharge Instructions (Addendum)
 Evelyn Baker  Thank you for allowing us  to take care of you today.  You came to the Emergency Department today because you recently had blood on the toilet paper as well as blood hitting outside of your stools after recent straining to have bowel movements.  You do have a history of hemorrhoids, but you are recently started on a blood thinner for a blood clot.  Here in the emergency department you do have some hemorrhoids, however you do not have any active bleeding.  Threateningly your blood counts look excellent today, 9.7 initially, as well as 9.1 on the repeat, from a baseline of about 6-1/2.  Given you do have history of anemia, and you have had red blood and you are on a blood thinner, it would be very reasonable to bring you into the hospital for monitoring, however given you are not having any other symptoms, and your blood counts are stable, it is not unreasonable to let you go home.  You should come back if you have any worsening bleeding, any new or different symptoms, or if you become worried about your blood counts.  You should follow-up closely with your primary care doctor for a repeat blood draw to recheck your blood counts early next week.  To-Do: 1. Please follow-up with your primary doctor within 1 - 2 weeks / as soon as possible.   Please return to the Emergency Department or call 911 if you experience have worsening of your symptoms, or do not get better, chest pain, shortness of breath, severe or significantly worsening pain, high fever, severe confusion, pass out or have any reason to think that you need emergency medical care.   We hope you feel better soon.   Mitzie Later, MD [facility]

## 2024-07-22 NOTE — Patient Instructions (Signed)

## 2024-07-22 NOTE — Progress Notes (Signed)
 Complements are low, glucose is elevated, creatinine improved, double-stranded DNA, SSA and Smith antibody results are pending.  Labs indicate active autoimmune disease.

## 2024-07-22 NOTE — ED Triage Notes (Signed)
 Patient reports she is on eliquis  since 12/15, having bright red blood in stool when she wipes, no symptoms. Patient is alert and oriented x 4. Airway patent, respirations even and unlabored. Skin normal, warm and dry.

## 2024-07-22 NOTE — Progress Notes (Signed)
 Thanks for taking care of her in the hospital.   ST

## 2024-07-22 NOTE — ED Provider Notes (Signed)
 " [place] EMERGENCY DEPARTMENT AT [facility] Provider Note   CSN: [id number] Arrival date & time: 07/22/24  1409     History Chief Complaint  Patient presents with   Melena    HPI: Evelyn Baker is a 64 y.o. 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 Patient arrived via POV accompanied by husband.  History provided by patient.  No interpreter required during this encounter.  Patient reports that she has a history of prior DVT and was recently started on Eliquis  for this.  Reports that she also has a history of prior hemorrhoids.  Reports that over the past couple of days she has had blood on the outside coating her stools, as well as blood dripping into the toilet and blood with wiping.  Denies any dizziness, lightheadedness, chest pain, shortness of breath, nausea, vomiting, diarrhea.  Reports that she has recently had some constipation and straining with stools.  Reports that she has previously had a hemorrhoid that busted and had similar symptoms, however she was concerned this time because she recently was started on Eliquis .  Reports that she also has a history of severe anemia, reportedly had an outpatient hemoglobin of 6.8 and is under the care of a hematologist and is getting iron  transfusions.  Given all of these factors together she wanted to come to the emergency department for further management.  Of note, patient is not amenable to blood.  Patient's recorded medical, surgical, social, medication list and allergies were reviewed in the Snapshot window as part of the initial history.   Prior to Admission medications  Medication Sig Start Date End Date Taking? Authorizing Provider  acetaminophen  (TYLENOL ) 500 MG tablet Take 2 tablets (1,000 mg total) by mouth every 8 (eight) hours as needed. 06/13/24   Sherrill Cable  Latif, DO  albuterol  (VENTOLIN  HFA) 108 [id number] Base) MCG/ACT inhaler Inhale 2 puffs into the lungs every 4 (four) hours as needed for wheezing or shortness of breath. 01/22/21   Bethanie Cough, PA-C  amLODipine  (NORVASC ) 10 MG tablet Take 1 tablet (10 mg total) by mouth daily. 07/12/24 10/10/24  Darryle Thom CROME, PA-C  apixaban  (ELIQUIS ) 5 MG TABS tablet Take 1 tablet (5 mg total) by mouth 2 (two) times daily. 06/14/24   Pasam, Chinita, MD  Ascorbic Acid (VITAMIN C) 100 MG tablet Take 100 mg by mouth daily.    [provider]  budesonide -formoterol  (SYMBICORT ) 160-4.5 MCG/ACT inhaler Inhale 2 puffs into the lungs 2 (two) times daily. 06/15/24   Hunsucker, Cough SAUNDERS, MD  carvedilol  (COREG ) 6.25 MG tablet Take 1 tablet (6.25 mg total) by mouth 2 (two) times daily with a meal. 07/21/24   Tolia, Sunit, DO  colchicine  0.6 MG tablet Take 1 tablet (0.6 mg total) by mouth 2 (two) times daily. 07/22/24   Tolia, Sunit, DO  cyanocobalamin  1000 MCG tablet Take 1 tablet (1,000 mcg total) by mouth daily. 06/13/24   Sherrill Cable Latif, DO  EPINEPHrine  0.3 mg/0.3 mL IJ SOAJ injection Inject 0.3 mg into the muscle as needed as directed. 02/11/24     ferrous sulfate  325 (65 FE) MG tablet Take 1 tablet (325 mg total) by mouth 3 (three) times daily with meals. 06/13/24   Sheikh, Cable Latif, DO  folic acid  (FOLVITE ) 1 MG tablet Take 1 tablet (1 mg total) by mouth daily. 07/20/24   Autumn Chinita, MD  furosemide  (  LASIX ) 20 MG tablet Take 1 tablet (20 mg total) by mouth daily. 07/07/24   Jillian Buttery, MD  hydrALAZINE  (APRESOLINE ) 25 MG tablet Take 1 tablet (25 mg) once daily as needed for systolic blood pressure > 170 Patient taking differently: Take 25 mg by mouth as needed. 06/03/24   Lelon Hamilton T, PA-C  isosorbide -hydrALAZINE  (BIDIL ) 20-37.5 MG tablet Take 2 tablets by mouth 3 (three) times daily. 07/06/24   Jillian Buttery, MD  levothyroxine  (SYNTHROID ) 112 MCG tablet Take 1 tablet (112 mcg total) by mouth in the  morning on an empty stomach. 11/09/23     mycophenolate  (CELLCEPT ) 500 MG tablet Take 1 tablet by mouth twice daily for 2 weeks, if labs are normal then increase to 2 tablets by mouth twice daily. 07/21/24   Dolphus Reiter, MD  ondansetron  (ZOFRAN ) 4 MG tablet Take 1 tablet (4 mg total) by mouth every 6 (six) hours as needed for nausea. Patient not taking: Reported on 07/21/2024 06/13/24   Sheikh, Omair Latif, DO  pantoprazole  (PROTONIX ) 40 MG tablet Take 1 tablet (40 mg total) by mouth 2 (two) times daily. Patient not taking: Reported on 07/21/2024 06/13/24   Sherrill Cable Latif, DO  potassium chloride  (MICRO-K ) 10 MEQ CR capsule Take 1 capsule (10 mEq total) by mouth 2 (two) times daily. 04/04/24   Tolia, Sunit, DO  predniSONE  (DELTASONE ) 10 MG tablet Take 4 tablets (40 mg total) by mouth daily with breakfast for 14 days, THEN 3 tablets (30 mg total) daily with breakfast for 14 days, THEN 2 tablets (20 mg total) daily with breakfast for 14 days, THEN 1 tablet (10 mg total) daily with breakfast for 14 days. then stop. 07/07/24 09/01/24  Jillian Buttery, MD  rosuvastatin  (CRESTOR ) 10 MG tablet Take 1 tablet (10 mg total) by mouth daily. 04/06/24 10/19/24  Tolia, Sunit, DO  senna-docusate (SENOKOT-S) 8.6-50 MG tablet Take 1 tablet by mouth at bedtime as needed for mild constipation. Patient not taking: Reported on 07/21/2024 06/13/24   Sheikh, Omair Latif, DO     Allergies: Shellfish allergy, Avocado, Codeine , Iodinated contrast media, Mushroom, Pineapple, and Penicillins   Review of Systems   ROS as per HPI  Physical Exam Updated Vital Signs BP (!) 162/96 (BP Location: Left Arm)   Pulse 68   Temp 98.4 F (36.9 C) (Oral)   Resp 16   SpO2 99%  Physical Exam Vitals and nursing note reviewed.  Constitutional:      General: She is not in acute distress.    Appearance: She is well-developed.  HENT:     Head: Normocephalic and atraumatic.  Eyes:     Conjunctiva/sclera: Conjunctivae normal.   Cardiovascular:     Rate and Rhythm: Normal rate and regular rhythm.     Heart sounds: No murmur heard. Pulmonary:     Effort: Pulmonary effort is normal. No respiratory distress.     Breath sounds: Normal breath sounds.  Abdominal:     Palpations: Abdomen is soft.     Tenderness: There is no abdominal tenderness.  Genitourinary:    Comments: Chaperone present, external hemorrhoids without ongoing bleeding, paucity of stool in the rectal vault, no gross hematochezia or melena Musculoskeletal:        General: No swelling.     Cervical back: Neck supple.  Skin:    General: Skin is warm and dry.     Capillary Refill: Capillary refill takes less than 2 seconds.  Neurological:     Mental Status: She is alert.  Psychiatric:        Mood and Affect: Mood normal.     ED Course/ Medical Decision Making/ A&P   Procedures Procedures   Medications Ordered in ED Medications  pantoprazole  (PROTONIX ) injection 40 mg (40 mg Intravenous Given 07/22/24 1940)    Medical Decision Making:   ANDRIA HEAD is a 64 y.o. female who presents for blood coating stool as per above.  Physical exam is pertinent for external hemorrhoids without gross melena,, hematochezia, ongoing bleeding.   The differential includes but is not limited to traumatic anemia, blood loss anemia, hemorrhoids, hemorrhoidal bleeding, constipation.  Independent historian: None  External data reviewed: Labs: reviewed prior labs for baseline  Initial Plan:  Screening labs including CBC and Metabolic panel to evaluate for infectious or metabolic etiology of disease.  Screening coags given patient is on Eliquis  Objective evaluation as below reviewed   Labs: Ordered, Independent interpretation, and Details: Coags reassuring.  CMP without AKI, emergent electro gentian, emergent LFT abnormality. Impressively, hemoglobin 9.7 on initial CBC, which is significantly improved from baseline of approximately 6.5.  Additionally there  is no leukocytosis or thrombocytopenia.  Repeat CBC demonstrates stable hemoglobin at 9.1.  Radiology: Not indicated  EKG/Medicine tests: Not indicated EKG Interpretation:                  Interventions: Protonix   See the EMR for full details regarding lab and imaging results.  Patient presents to the emergency department for rectal bleeding.  Patient is asymptomatic outside of reported bleeding, no systemic symptoms of symptomatic anemia, she does not have ongoing bleeding on exam, though does have external hemorrhoids and has a history of bleeding hemorrhoids.  Coags are reassuring, initial hemoglobin is much improved from baseline, patient reports that she has been receiving iron  transfusions, does not accept blood products.  Denies any decreased p.o. intake to make me suspicious for hemoconcentration.  Protonix  was given empirically in the setting of GI bleed, and repeat CBC was obtained, patient remained hemodynamically stable and asymptomatic while in the ED, repeat hemoglobin is overall stable at 9.1.  Overall, I do feel that it is very reasonable for patient to be admitted given history of severe anemia, lack of acceptance of blood products, and use of DOAC, however patient feels strongly that she would like to follow-up outpatient, reports that she does have a follow-up scheduled with her hematologist on Tuesday, in 4 days, and that she will have her blood counts rechecked at this time.  Given she is otherwise asymptomatic, does not have ongoing bleeding, and has stable hemoglobin that is significantly improved from baseline, do not feel that this is unreasonable, thus after shared decision making, patient discharged with strict return precautions.  Discussed that she should use MiraLAX to help soften her stools, and she should not strain to have a bowel movement as this would increase her risk of hemorrhoidal bleeding.  Patient expressed understanding, discharged in stable  condition.  Presentation is most consistent with acute complicated illness, Current presentation is complicated by underlying chronic conditions, and I did consider and rule out acute life/limb-threatening illness  Discussion of management or test interpretations with external provider(s): Not indicated  Risk Drugs:Prescription drug management  Disposition: DISCHARGE: I believe that the patient is safe for discharge home with outpatient follow-up. Patient was informed of all pertinent physical exam, laboratory, and imaging findings. Patient's suspected etiology of their symptom presentation was discussed with the patient and all questions were answered. We discussed following up  with PCP/hematologist. I provided thorough ED return precautions. The patient feels safe and comfortable with this plan.  MDM generated using voice dictation software and may contain dictation errors.  Please contact me for any clarification or with any questions.  Clinical Impression:  1. Hemorrhoids, unspecified hemorrhoid type      Discharge   Final Clinical Impression(s) / ED Diagnoses Final diagnoses:  Hemorrhoids, unspecified hemorrhoid type    Rx / DC Orders ED Discharge Orders     None        Rogelia Jerilynn RAMAN, MD 07/22/24 2203  "

## 2024-07-23 ENCOUNTER — Other Ambulatory Visit (HOSPITAL_BASED_OUTPATIENT_CLINIC_OR_DEPARTMENT_OTHER): Payer: Self-pay

## 2024-07-23 LAB — COMPREHENSIVE METABOLIC PANEL WITH GFR
ALT: 20 [IU]/L (ref 0–32)
AST: 15 [IU]/L (ref 0–40)
Albumin: 3.1 g/dL — ABNORMAL LOW (ref 3.9–4.9)
Alkaline Phosphatase: 81 [IU]/L (ref 49–135)
BUN/Creatinine Ratio: 21 (ref 12–28)
BUN: 21 mg/dL (ref 8–27)
Bilirubin Total: 0.4 mg/dL (ref 0.0–1.2)
CO2: 27 mmol/L (ref 20–29)
Calcium: 8.9 mg/dL (ref 8.7–10.3)
Chloride: 97 mmol/L (ref 96–106)
Creatinine, Ser: 1.01 mg/dL — ABNORMAL HIGH (ref 0.57–1.00)
Globulin, Total: 3 g/dL (ref 1.5–4.5)
Glucose: 151 mg/dL — ABNORMAL HIGH (ref 70–99)
Potassium: 3.7 mmol/L (ref 3.5–5.2)
Sodium: 135 mmol/L (ref 134–144)
Total Protein: 6.1 g/dL (ref 6.0–8.5)
eGFR: 63 mL/min/{1.73_m2}

## 2024-07-23 LAB — SJOGRENS SYNDROME-A EXTRACTABLE NUCLEAR ANTIBODY: ENA SSA (RO) Ab: >8 AI — ABNORMAL HIGH (ref 0.0–0.9)

## 2024-07-23 LAB — C3 AND C4
Complement C3, Serum: 81 mg/dL — ABNORMAL LOW (ref 82–167)
Complement C4, Serum: 6 mg/dL — ABNORMAL LOW (ref 12–38)

## 2024-07-23 LAB — ANTI-DNA ANTIBODY, DOUBLE-STRANDED: dsDNA Ab: 2 [IU]/mL (ref 0–9)

## 2024-07-23 LAB — ANTI-SMITH ANTIBODY: ENA SM Ab Ser-aCnc: 0.2 AI (ref 0.0–0.9)

## 2024-07-24 NOTE — Progress Notes (Signed)
 Glucose is elevated at 151 (due to prednisone  use), creatinine is elevated but improved.  C3-C4 remain low, SSA antibodies positive.  Double-stranded DNA negative, Smith negative.  Continue current treatment.

## 2024-07-25 ENCOUNTER — Ambulatory Visit: Admitting: Rheumatology

## 2024-07-25 ENCOUNTER — Ambulatory Visit

## 2024-07-25 ENCOUNTER — Inpatient Hospital Stay: Admitting: Oncology

## 2024-07-25 ENCOUNTER — Inpatient Hospital Stay

## 2024-07-25 DIAGNOSIS — Z8709 Personal history of other diseases of the respiratory system: Secondary | ICD-10-CM

## 2024-07-25 DIAGNOSIS — G8929 Other chronic pain: Secondary | ICD-10-CM

## 2024-07-25 DIAGNOSIS — M25572 Pain in left ankle and joints of left foot: Secondary | ICD-10-CM

## 2024-07-25 DIAGNOSIS — M7072 Other bursitis of hip, left hip: Secondary | ICD-10-CM

## 2024-07-25 DIAGNOSIS — R7989 Other specified abnormal findings of blood chemistry: Secondary | ICD-10-CM

## 2024-07-25 DIAGNOSIS — H35033 Hypertensive retinopathy, bilateral: Secondary | ICD-10-CM

## 2024-07-25 DIAGNOSIS — M79642 Pain in left hand: Secondary | ICD-10-CM

## 2024-07-25 DIAGNOSIS — M19072 Primary osteoarthritis, left ankle and foot: Secondary | ICD-10-CM

## 2024-07-25 DIAGNOSIS — I6381 Other cerebral infarction due to occlusion or stenosis of small artery: Secondary | ICD-10-CM

## 2024-07-25 DIAGNOSIS — E785 Hyperlipidemia, unspecified: Secondary | ICD-10-CM

## 2024-07-25 DIAGNOSIS — Z79899 Other long term (current) drug therapy: Secondary | ICD-10-CM

## 2024-07-25 DIAGNOSIS — E038 Other specified hypothyroidism: Secondary | ICD-10-CM

## 2024-07-25 DIAGNOSIS — Z86718 Personal history of other venous thrombosis and embolism: Secondary | ICD-10-CM

## 2024-07-25 DIAGNOSIS — I10 Essential (primary) hypertension: Secondary | ICD-10-CM

## 2024-07-25 DIAGNOSIS — M351 Other overlap syndromes: Secondary | ICD-10-CM

## 2024-07-25 DIAGNOSIS — R809 Proteinuria, unspecified: Secondary | ICD-10-CM

## 2024-07-25 DIAGNOSIS — R400 Somnolence: Secondary | ICD-10-CM

## 2024-07-25 DIAGNOSIS — F329 Major depressive disorder, single episode, unspecified: Secondary | ICD-10-CM

## 2024-07-25 DIAGNOSIS — K219 Gastro-esophageal reflux disease without esophagitis: Secondary | ICD-10-CM

## 2024-07-25 DIAGNOSIS — Z8261 Family history of arthritis: Secondary | ICD-10-CM

## 2024-07-25 DIAGNOSIS — I872 Venous insufficiency (chronic) (peripheral): Secondary | ICD-10-CM

## 2024-07-25 NOTE — Telephone Encounter (Signed)
 PharmD HTN appt scheduled 07/29/24

## 2024-07-26 ENCOUNTER — Other Ambulatory Visit (HOSPITAL_COMMUNITY): Payer: Self-pay

## 2024-07-26 ENCOUNTER — Other Ambulatory Visit: Payer: Self-pay

## 2024-07-26 ENCOUNTER — Encounter: Payer: Self-pay | Admitting: Oncology

## 2024-07-26 ENCOUNTER — Inpatient Hospital Stay

## 2024-07-26 ENCOUNTER — Inpatient Hospital Stay: Admitting: Oncology

## 2024-07-26 VITALS — BP 140/83 | HR 87 | Temp 97.2°F | Resp 14 | Wt 187.2 lb

## 2024-07-26 DIAGNOSIS — D649 Anemia, unspecified: Secondary | ICD-10-CM

## 2024-07-26 DIAGNOSIS — M351 Other overlap syndromes: Secondary | ICD-10-CM

## 2024-07-26 DIAGNOSIS — I2510 Atherosclerotic heart disease of native coronary artery without angina pectoris: Secondary | ICD-10-CM

## 2024-07-26 DIAGNOSIS — D509 Iron deficiency anemia, unspecified: Secondary | ICD-10-CM | POA: Diagnosis not present

## 2024-07-26 LAB — CBC WITH DIFFERENTIAL (CANCER CENTER ONLY)
Abs Immature Granulocytes: 0.05 10*3/uL (ref 0.00–0.07)
Basophils Absolute: 0 10*3/uL (ref 0.0–0.1)
Basophils Relative: 0 %
Eosinophils Absolute: 0.1 10*3/uL (ref 0.0–0.5)
Eosinophils Relative: 2 %
HCT: 26.8 % — ABNORMAL LOW (ref 36.0–46.0)
Hemoglobin: 9.4 g/dL — ABNORMAL LOW (ref 12.0–15.0)
Immature Granulocytes: 1 %
Lymphocytes Relative: 19 %
Lymphs Abs: 1.1 10*3/uL (ref 0.7–4.0)
MCH: 28.5 pg (ref 26.0–34.0)
MCHC: 35.1 g/dL (ref 30.0–36.0)
MCV: 81.2 fL (ref 80.0–100.0)
Monocytes Absolute: 0.6 10*3/uL (ref 0.1–1.0)
Monocytes Relative: 10 %
Neutro Abs: 3.9 10*3/uL (ref 1.7–7.7)
Neutrophils Relative %: 68 %
Platelet Count: 166 10*3/uL (ref 150–400)
RBC: 3.3 MIL/uL — ABNORMAL LOW (ref 3.87–5.11)
RDW: 25.3 % — ABNORMAL HIGH (ref 11.5–15.5)
WBC Count: 5.8 10*3/uL (ref 4.0–10.5)
nRBC: 0 % (ref 0.0–0.2)

## 2024-07-26 MED ORDER — VALSARTAN 160 MG PO TABS
160.0000 mg | ORAL_TABLET | Freq: Two times a day (BID) | ORAL | 0 refills | Status: AC
  Filled 2024-07-26 (×2): qty 60, 30d supply, fill #0

## 2024-07-26 MED ORDER — CARVEDILOL 12.5 MG PO TABS
12.5000 mg | ORAL_TABLET | Freq: Two times a day (BID) | ORAL | 0 refills | Status: AC
  Filled 2024-07-26: qty 60, 30d supply, fill #0

## 2024-07-26 MED ORDER — ISOSORBIDE MONONITRATE ER 120 MG PO TB24
120.0000 mg | ORAL_TABLET | Freq: Every day | ORAL | 0 refills | Status: DC
  Filled 2024-07-26: qty 30, 30d supply, fill #0

## 2024-07-26 NOTE — Telephone Encounter (Signed)
 I spoke to the patient over the phone.   Currently her SBP ranges between 150-18mmHg on the following medications.  Coreg  6.25mg  po bid  Furosemide  20mg  qay  Bidil  20/37.5mg  2 tab po tid  Hydralazine  prn for SBP 170 Norvasc  10mg  po qam   Given her rheumatological comorbidities would like to discontinue hydralazine .  Stop BiDil  20/37.5 mg 2 tabs twice daily Start Imdur  120mg  po qpm (new script send in for 30days) - send in a new script.  Continue furosemide  20 mg p.o. every morning  Increase Coreg  12.5 mg po bid - send in new script.  Continue Norvasc  10 mg po qday  Restart valsartan  160 mg po bid  -sending new script.   For the new script only 30 day supply until the doses are titrated.  Check BMP in one week after medication change to check renal function.  She has Pharm D appt this Friday.   Today she has taken some medications today so for tonight only  Start Coreg  12.5mg  dose  Stop BiDil   Start Valsartan  160 mg dose  Dr. Michele

## 2024-07-26 NOTE — Progress Notes (Signed)
 "   Evelyn Baker  HEMATOLOGY CLINIC NOTE   PATIENT NAME: Evelyn Baker   MR#: [id number] DOB: May 04, 1961  DATE OF SERVICE: 07/26/2024  Patient Care Team: Katina Pfeiffer, PA-C as PCP - General (Family Medicine) Michele Richardson, DO as PCP - Cardiology (Cardiology) Dolphus Reiter, MD as Consulting Physician (Rheumatology) Macel Jayson PARAS, MD as Consulting Physician (Nephrology) Autumn Millman, MD as Consulting Physician (Oncology)   REASON FOR VISIT/ CHIEF COMPLAINT:  Anemia.  Patient refuses blood products for reasons of conscience   ASSESSMENT & PLAN:  Evelyn Baker is a 64 y.o. lady, who is a [employer], with a past medical history of hypertension, dyslipidemia, hypothyroidism, depression, was referred to our service in December 2025 for evaluation of normocytic anemia.    Normocytic anemia Chronic, persistent normocytic anemia with hemoglobin at 6.8 g/dL in December 2025, significantly below her baseline of 11-12 g/dL since December 7974. No evidence of active hemorrhage. Etiology is multifactorial, likely due to chronic kidney disease and underlying connective tissue disease.   Elevated erythropoietin  levels preclude further erythropoietin -stimulating agents. Current focus is on iron  repletion to optimize hemoglobin prior to biopsy.  She was given 2 doses of Feraheme in January 2026.  Hemoglobin has improved from 6.4-6.7 g/dL to 9.4 g/dL following two iron  infusions. No evidence of ongoing or internal bleeding after recent hemorrhoidal bleeding. White blood cell and platelet counts are stable. Both Cellcept  and Imuran  may contribute to cytopenias, but counts remain stable. Efforts continue to minimize iatrogenic blood loss from laboratory testing. - Reviewed hemoglobin (9.4 g/dL) and confirmed significant improvement.  - Advised continuation of oral iron  twice daily and folic acid  supplementation.  - Minimized frequency of blood draws; extended  interval to every four weeks unless symptoms or concerns arise.  - Communicated with other providers to minimize blood volume drawn for labs, including use of pediatric tubes when possible.  - Deferred additional erythropoietin -stimulating agents due to elevated endogenous levels.  No evidence of ongoing hemolysis-LDH, haptoglobin were within normal limits and Coombs test negative on 06/14/2024.  She tolerates oral iron , though constipation is managed. No overt blood loss or symptoms of acute anemia.   - Advised monitoring for constipation; use Senokot as needed and MiraLAX daily if regular bowel movements are not achieved.  - Advised to report any signs of bleeding, including hematochezia, melena, or epistaxis.  - Ordered follow-up complete blood count at next visit in four weeks.  Mixed connective tissue disease Patient is currently established with Dr. Dolphus.  C3-C4 remain low, SSA antibodies positive. Double-stranded DNA negative, Smith negative.   She was previously on Imuran , recently switched to CellCept  by Dr. Dolphus. Both agents carry comparable risk for cytopenias.  Fortunately her blood counts are all stable without evidence of leukopenia or thrombocytopenia and improved hemoglobin.  Will continue to monitor for now.  Hypertension Hypertension is managed with valsartan . Blood pressure remains elevated in the 160s-170s, with a target of 130s-140s. Management is coordinated with cardiology. - Reviewed antihypertensive regimen (valsartan ) and recent blood pressure trends. - Encouraged ongoing follow-up with cardiology for blood pressure management.  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 Most recent creatinine was 0.9 mg/dL on 8/76/7973, within normal limits. Kidney biopsy may be deferred given stable renal function. Nephrology follow-up is scheduled. - Reviewed kidney function (creatinine 0.9 mg/dL, normal electrolytes and liver  function). - Continued monitoring of renal function in coordination with nephrology.  Venous thromboembolism Venous thromboembolism with recent interruption of  anticoagulation due to anemia. No current symptoms of clot progression or bleeding. Hemoglobin is improving and anticoagulation with Eliquis  was restarted from 06/14/2024. - Instructed to monitor for and report any signs of bleeding, including hematochezia, melena, epistaxis, or unusual bruising. - Persistent lower extremity swelling and weakness are attributed to the thrombus. No symptoms of pulmonary embolism. She tolerates anticoagulation without bleeding. Diuretics are ineffective for thrombotic edema. - Continued apixaban  twice daily for anticoagulation. - Held chlorthalidone  due to hypotension and lack of efficacy for thrombotic edema. - Recommended leg elevation to reduce swelling.  Pericardial effusion - She has pericardial effusion without tamponade.  Established with cardiology.  Currently on colchicine  and steroid taper. - Pericardiocentesis is deferred because of anemia issues.  I reviewed lab results and outside records for this visit and discussed relevant results with the patient. Diagnosis, plan of care and treatment options were also discussed in detail with the patient. Opportunity provided to ask questions and answers provided to her apparent satisfaction. Provided instructions to call our clinic with any problems, questions or concerns prior to return visit. I recommended to continue follow-up with PCP and sub-specialists. She verbalized understanding and agreed with the plan. No barriers to learning was detected.  [name], MD  07/26/2024 5:05 PM  [facility] - A DEPT OF [facility] [id number] [place] [address] Dept: [phone] Dept Fax: [phone]   HISTORY OF PRESENT ILLNESS:  Discussed the use of AI scribe software for  clinical note transcription with the patient, who gave verbal consent to proceed.  History of Present Illness Evelyn Baker is a 64 year old female with systemic lupus erythematosus, chronic anemia, and chronic kidney disease who presents for hematology follow-up to monitor anemia status after recent iron  infusions and medication adjustments.  She reports significant improvement in overall well-being and laboratory parameters since her last visit. Hemoglobin has increased to 9.4 g/dL from prior levels of 6.4 and 6.7 g/dL. She recently completed two iron  infusions, including a higher dose formulation, and continues oral iron  twice daily with folic acid  supplementation. She reports a recent episode of bright red rectal bleeding, which was evaluated in the emergency department, and she was told it was likely due to hemorrhoids. She denies current symptoms of fatigue, chest pain, or dyspnea.  She is managed with Cellcept  for lupus, having previously been on Imuran . Both agents are recognized as potential contributors to cytopenias, but her white blood cell and platelet counts remain within normal limits. She continues Eliquis  for anticoagulation and has resumed valsartan  for hypertension, with recent blood pressure readings in the 160s-170s.  Renal function was recently assessed in the emergency department, with creatinine at 0.9 mg/dL, and normal liver function and electrolytes. She is scheduled for follow-up with nephrology and cardiology. She expresses a desire to minimize blood draws and inquires about the use of pediatric tubes or finger pricks for upcoming laboratory testing.  EPO was increased at 27 (range 2.6-18.5) on 07/01/2024   SUMMARY OF HEMATOLOGY HISTORY:  64 y.o. lady who is a Jehovah's Witness, presented to the hospital on 06/03/2024 with complaints of left lower extremity pain and swelling.  Imaging indicative of DVT.  She was found to have iron  deficiency anemia with hemoglobin of 6.8,  iron  decreased at 24, iron  saturation decreased at 14%.  We were consulted during that hospitalization for additional recommendations, since she cannot take blood products.   She was given IV iron , B12 and folic  acid supplementation and EPO.   She has not previously been diagnosed with anemia, but her family noted longstanding cold intolerance and fatigue, with worsening symptoms since 2020. She describes persistent cold sensation regardless of season, requiring extra clothing, and significant fatigue, somnolence, and decreased activity, including falling asleep quickly in the car and reduced driving.   She denied chest pain, hemoptysis, or overt bleeding, including melena or epistaxis. She experiences mild cough and discomfort when lying flat, which resolves when sitting up or when the room is less stuffy. She endorses occasional dyspnea, particularly when doors are closed, and intermittent dizziness and lightheadedness. She denies abdominal pain or changes in bowel habits. Colonoscopy ten years ago was normal; repeat is due in 2026.   Provided dietary counseling to increase iron  intake (green leafy vegetables, red meat, liver) and vitamin C supplementation to enhance absorption.  - Advised to report symptoms of worsening anemia (chest pain, dyspnea, dizziness, palpitations). - Deferred invasive GI evaluation until hemoglobin improves, but will pursue if ongoing or unexplained blood loss is suspected. - Coordinate with care team to minimize phlebotomy blood loss (pediatric tubes, minimal draws).  She established with our clinic on 06/14/2024.  Hemoglobin was stable at 7.8.  She was advised to continue iron  supplements with ferrous sulfate  325 mg p.o. 3 times daily or at least twice daily as tolerated.  Later on she received IV iron  with Feraheme x 2 doses in January 2026.  Hemoglobin was 9.4 as of 07/26/2024.  Renal function normalized.  MEDICAL HISTORY Past Medical History:  Diagnosis Date    Asthma    Depression    Dyspnea    Hyperlipidemia    Hypertension    Hypothyroidism    Sciatic nerve disease    Thyroid  disease      SURGICAL HISTORY Past Surgical History:  Procedure Laterality Date   FOOT SURGERY Left    STRABISMUS SURGERY     age 89   TOTAL ABDOMINAL HYSTERECTOMY     VEIN LIGATION AND STRIPPING Left 01/22/2021   Procedure: LEFT GREATER SAPHENOUS VEIN  LIGATION AND STRIPPING;  Surgeon: Harvey Carlin BRAVO, MD;  Location: [facility] OR;  Service: Vascular;  Laterality: Left;     SOCIAL HISTORY: She reports that she has never smoked. She has never been exposed to tobacco smoke. She has never used smokeless tobacco. She reports that she does not drink alcohol and does not use drugs. Social History   Socioeconomic History   Marital status: Married    Spouse name: Not on file   Number of children: 4   Years of education: Not on file   Highest education level: Not on file  Occupational History   Not on file  Tobacco Use   Smoking status: Never    Passive exposure: Never   Smokeless tobacco: Never  Vaping Use   Vaping status: Never Used  Substance and Sexual Activity   Alcohol use: No   Drug use: No   Sexual activity: Yes    Birth control/protection: Surgical    Comment: partial hysterectomy  Other Topics Concern   Not on file  Social History Narrative   Not on file   Social Drivers of Health   Tobacco Use: Low Risk (07/21/2024)   Patient History    Smoking Tobacco Use: Never    Smokeless Tobacco Use: Never    Passive Exposure: Never  Financial Resource Strain: Not on file  Food Insecurity: No Food Insecurity (06/28/2024)   Epic    Worried About  Running Out of Food in the Last Year: Never true    Ran Out of Food in the Last Year: Never true  Transportation Needs: No Transportation Needs (06/28/2024)   Epic    Lack of Transportation (Medical): No    Lack of Transportation (Non-Medical): No  Physical Activity: Not on file  Stress: Not on file  Social  Connections: Socially Integrated (06/28/2024)   Social Connection and Isolation Panel    Frequency of Communication with Friends and Family: More than three times a week    Frequency of Social Gatherings with Friends and Family: More than three times a week    Attends Religious Services: More than 4 times per year    Active Member of [employer] or Organizations: Yes    Attends Banker Meetings: More than 4 times per year    Marital Status: Married  Catering Manager Violence: Not At Risk (06/28/2024)   Epic    Fear of Current or Ex-Partner: No    Emotionally Abused: No    Physically Abused: No    Sexually Abused: No  Depression (PHQ2-9): Low Risk (07/26/2024)   Depression (PHQ2-9)    PHQ-2 Score: 0  Alcohol Screen: Not on file  Housing: Low Risk (06/28/2024)   Epic    Unable to Pay for Housing in the Last Year: No    Number of Times Moved in the Last Year: 0    Homeless in the Last Year: No  Utilities: Not At Risk (06/28/2024)   Epic    Threatened with loss of utilities: No  Health Literacy: Not on file    FAMILY HISTORY: Her family history includes Aneurysm in her sister; Heart Problems in her brother; Heart failure in her mother; Hypertension in her daughter and son; Kidney failure in her mother; Stroke in her sister.  CURRENT MEDICATIONS   Current Outpatient Medications  Medication Instructions   Acetaminophen  Extra Strength 1,000 mg, Oral, Every 8 hours PRN   albuterol  (VENTOLIN  HFA) 108 (90 Base) MCG/ACT inhaler 2 puffs, Every 4 hours PRN   amLODipine  (NORVASC ) 10 mg, Oral, Daily   B-12 1,000 mcg, Oral, Daily   budesonide -formoterol  (SYMBICORT ) 160-4.5 MCG/ACT inhaler 2 puffs, Inhalation, 2 times daily   carvedilol  (COREG ) 12.5 mg, Oral, 2 times daily   colchicine  0.6 mg, Oral, 2 times daily   Eliquis  5 mg, Oral, 2 times daily   EPINEPHrine  0.3 mg/0.3 mL IJ SOAJ injection Inject 0.3 mg into the muscle as needed as directed.   FeroSul 325 mg, Oral, 3 times daily  with meals   folic acid  (FOLVITE ) 1 mg, Oral, Daily   furosemide  (LASIX ) 20 mg, Oral, Daily   levothyroxine  (SYNTHROID ) 112 MCG tablet Take 1 tablet (112 mcg total) by mouth in the morning on an empty stomach.   mycophenolate  (CELLCEPT ) 500 MG tablet Take 1 tablet by mouth twice daily for 2 weeks, if labs are normal then increase to 2 tablets by mouth twice daily.   potassium chloride  (MICRO-K ) 10 MEQ CR capsule 10 mEq, Oral, 2 times daily   predniSONE  (DELTASONE ) 10 MG tablet Take 4 tablets (40 mg total) by mouth daily with breakfast for 14 days, THEN 3 tablets (30 mg total) daily with breakfast for 14 days, THEN 2 tablets (20 mg total) daily with breakfast for 14 days, THEN 1 tablet (10 mg total) daily with breakfast for 14 days. then stop.   rosuvastatin  (CRESTOR ) 10 mg, Oral, Daily   valsartan  (DIOVAN ) 160 mg, Oral, 2 times daily  vitamin C 100 mg, Daily     ALLERGIES  She is allergic to shellfish allergy, avocado, codeine , iodinated contrast media, mushroom, pineapple, and penicillins.  REVIEW OF SYSTEMS:  Review of Systems - Oncology   Rest of the pertinent review of systems is unremarkable except as mentioned above in HPI.  PHYSICAL EXAMINATION:    Onc Performance Status - 07/26/24 1521       ECOG Perf Status   ECOG Perf Status Ambulatory and capable of all selfcare but unable to carry out any work activities.  Up and about more than 50% of waking hours      KPS SCALE   KPS % SCORE Normal activity with effort, some s/s of disease         Vitals:   07/26/24 1507 07/26/24 1508  BP: (!) 144/89 (!) 140/83  Pulse: 87   Resp: 14   Temp: (!) 97.2 F (36.2 C)   SpO2: 100%    Filed Weights   07/26/24 1507  Weight: 187 lb 3.2 oz (84.9 kg)   Physical Exam Constitutional:      General: She is not in acute distress.    Appearance: Normal appearance.  HENT:     Head: Normocephalic and atraumatic.  Cardiovascular:     Rate and Rhythm: Normal rate.  Pulmonary:      Effort: Pulmonary effort is normal. No respiratory distress.  Abdominal:     General: There is no distension.  Neurological:     General: No focal deficit present.     Mental Status: She is alert and oriented to person, place, and time.  Psychiatric:        Mood and Affect: Mood normal.        Behavior: Behavior normal.      LABORATORY DATA:   I have reviewed the data as listed.  Results for orders placed or performed in visit on 07/26/24  CBC with Differential ([facility] Only)  Result Value Ref Range   WBC Count 5.8 4.0 - 10.5 K/uL   RBC 3.30 (L) 3.87 - 5.11 MIL/uL   Hemoglobin 9.4 (L) 12.0 - 15.0 g/dL   HCT 73.1 (L) 63.9 - 53.9 %   MCV 81.2 80.0 - 100.0 fL   MCH 28.5 26.0 - 34.0 pg   MCHC 35.1 30.0 - 36.0 g/dL   RDW 74.6 (H) 88.4 - 84.4 %   Platelet Count 166 150 - 400 K/uL   nRBC 0.0 0.0 - 0.2 %   Neutrophils Relative % 68 %   Neutro Abs 3.9 1.7 - 7.7 K/uL   Lymphocytes Relative 19 %   Lymphs Abs 1.1 0.7 - 4.0 K/uL   Monocytes Relative 10 %   Monocytes Absolute 0.6 0.1 - 1.0 K/uL   Eosinophils Relative 2 %   Eosinophils Absolute 0.1 0.0 - 0.5 K/uL   Basophils Relative 0 %   Basophils Absolute 0.0 0.0 - 0.1 K/uL   Immature Granulocytes 1 %   Abs Immature Granulocytes 0.05 0.00 - 0.07 K/uL    Hemoglobin was 6.8 on 07/11/2024  RADIOGRAPHIC STUDIES:  I have personally reviewed the radiological images as listed and agreed with the findings in the report.  DG Chest 2 View Result Date: 07/23/2024 EXAM: 2 VIEW(S) XRAY OF THE CHEST 07/19/2024 11:37:49 AM COMPARISON: 06/27/2024 CLINICAL HISTORY: Pleural and pericardial effusion. FINDINGS: LUNGS AND PLEURA: Persistent small left pleural effusion. Improved right pleural effusion. Linear atelectasis in lateral aspect of left lung. No pneumothorax. HEART AND MEDIASTINUM: No  acute abnormality of the cardiac and mediastinal silhouettes. BONES AND SOFT TISSUES: No acute osseous abnormality. IMPRESSION: 1. Persistent small left  pleural effusion and improved right pleural effusion. 2. Linear atelectasis in the lateral aspect of the left lung. Electronically signed by: Oneil Devonshire MD 07/23/2024 01:37 AM EST RP Workstation: [id number]   ECHOCARDIOGRAM LIMITED Result Date: 07/11/2024    ECHOCARDIOGRAM LIMITED REPORT   Patient Name:   Evelyn Baker Date of Exam: 07/11/2024 Medical Rec #:  [id number]       Height:       65.0 in Accession #:    [id number]      Weight:       191.3 lb Date of Birth:  [birth year]       BSA:          1.941 m Patient Age:    63 years        BP:           185/90 mmHg Patient Gender: F               HR:           78 bpm. Exam Location:  Outpatient Procedure: 2D Echo and Cardiac Doppler (Both Spectral and Color Flow Doppler            were utilized during procedure). Indications:    Pericardial effusion I31.3  History:        Patient has prior history of Echocardiogram examinations, most                 recent 07/05/2024. Risk Factors:Hypertension.  Sonographer:    Tinnie Gosling RDCS Referring Phys: [id number] BRIAN S CRENSHAW IMPRESSIONS  1. Left ventricular ejection fraction, by estimation, is 60 to 65%. The left ventricle has normal function. The left ventricle has no regional wall motion abnormalities. There is mild asymmetric left ventricular hypertrophy of the basal-septal segment.  2. Right ventricular systolic function is normal. The right ventricular size is normal.  3. There is a moderate to large circumfrential pericardial effsuion which has decreased in size since previous study on 07/06/23. No tamponade. The pericardial effusion is circumferential. There is no evidence of cardiac tamponade.  4. The mitral valve is grossly normal.  5. The aortic valve is normal in structure.  6. The inferior vena cava is dilated in size with >50% respiratory variability, suggesting right atrial pressure of 8 mmHg. FINDINGS  Left Ventricle: Left ventricular ejection fraction, by estimation, is 60 to 65%. The left ventricle has normal  function. The left ventricle has no regional wall motion abnormalities. The left ventricular internal cavity size was normal in size. There is  mild asymmetric left ventricular hypertrophy of the basal-septal segment. Right Ventricle: The right ventricular size is normal. No increase in right ventricular wall thickness. Right ventricular systolic function is normal. Pericardium: There is a moderate to large circumfrential pericardial effsuion which has decreased in size since previous study on 07/06/23. No tamponade. The pericardial effusion is circumferential. There is no evidence of cardiac tamponade. Mitral Valve: The mitral valve is grossly normal. Tricuspid Valve: The tricuspid valve is grossly normal. Aortic Valve: The aortic valve is normal in structure. Pulmonic Valve: The pulmonic valve was not well visualized. No evidence of pulmonic stenosis. Aorta: The aortic root is normal in size and structure. Venous: The inferior vena cava is dilated in size with greater than 50% respiratory variability, suggesting right atrial pressure of 8 mmHg. IAS/Shunts: No atrial level shunt detected by color  flow Doppler. LEFT VENTRICLE PLAX 2D LVIDd:         4.20 cm LVIDs:         2.80 cm LV PW:         1.00 cm LV IVS:        1.00 cm  LV Volumes (MOD) LV vol d, MOD A4C: 99.3 ml LV vol s, MOD A4C: 30.7 ml LV SV MOD A4C:     99.3 ml IVC IVC diam: 2.50 cm Toribio Fuel MD Electronically signed by Toribio Fuel MD Signature Date/Time: 07/11/2024/8:05:14 PM    Final    ECHOCARDIOGRAM LIMITED Result Date: 07/05/2024    ECHOCARDIOGRAM LIMITED REPORT   Patient Name:   Evelyn Baker Date of Exam: 07/05/2024 Medical Rec #:  [id number]       Height:       65.0 in Accession #:    [id number]      Weight:       191.3 lb Date of Birth:  [birth year]       BSA:          1.941 m Patient Age:    63 years        BP:           162/94 mmHg Patient Gender: F               HR:           101 bpm. Exam Location:  Inpatient Procedure: Limited Echo,  Cardiac Doppler and Color Doppler (Both Spectral and            Color Flow Doppler were utilized during procedure). Indications:    I31.3 Pericardial effusion (noninflammatory)  History:        Patient has prior history of Echocardiogram examinations, most                 recent 07/01/2024. CHF; Stroke. Pulmonary edema. Pericardial                 effision.  Sonographer:    Ellouise Mose RDCS Referring Phys: [id number] BRIAN S CRENSHAW IMPRESSIONS  1. Left ventricular ejection fraction, by estimation, is 60 to 65%. The left ventricle has normal function. There is moderate asymmetric left ventricular hypertrophy of the septal segment.  2. No right ventricular collapse. Right ventricular systolic function is normal. The right ventricular size is normal. There is normal pulmonary artery systolic pressure.  3. Large pericardial effusion. The pericardial effusion is circumferential. Large pleural effusion in the left lateral region.  4. The aortic valve is tricuspid. There is mild calcification of the aortic valve. Aortic valve sclerosis is present, with no evidence of aortic valve stenosis.  5. The inferior vena cava is dilated in size with >50% respiratory variability, suggesting right atrial pressure of 8 mmHg. Comparison(s): Prior images reviewed side by side. Pericardial effusion is larger. Improvement in respirophasic variation across the mitral valve; persistent variation across the tricuspid valve. No evidence of ventricular or atrial collapse on either study. New tachycardia present on this study, but without IVC plethora. Stable pleural effusion. FINDINGS  Left Ventricle: Left ventricular ejection fraction, by estimation, is 60 to 65%. The left ventricle has normal function. There is moderate asymmetric left ventricular hypertrophy of the septal segment. Right Ventricle: No right ventricular collapse. The right ventricular size is normal. No increase in right ventricular wall thickness. Right ventricular systolic function  is normal. There is normal pulmonary artery systolic pressure. The tricuspid regurgitant velocity is 2.15  m/s, and with an assumed right atrial pressure of 8 mmHg, the estimated right ventricular systolic pressure is 26.5 mmHg. Pericardium: A large pericardial effusion is present. The pericardial effusion is circumferential. Tricuspid Valve: The tricuspid valve is normal in structure. Tricuspid valve regurgitation is not demonstrated. No evidence of tricuspid stenosis. Aortic Valve: The aortic valve is tricuspid. There is mild calcification of the aortic valve. Aortic valve sclerosis is present, with no evidence of aortic valve stenosis. Venous: The inferior vena cava is dilated in size with greater than 50% respiratory variability, suggesting right atrial pressure of 8 mmHg. Additional Comments: There is a large pleural effusion in the left lateral region. Spectral Doppler performed. Color Doppler performed.  LEFT VENTRICLE PLAX 2D LVIDd:         3.20 cm LVIDs:         2.30 cm LV PW:         1.20 cm LV IVS:        1.40 cm  LV Volumes (MOD) LV vol d, MOD A2C: 58.7 ml LV vol d, MOD A4C: 55.8 ml LV vol s, MOD A2C: 20.6 ml LV vol s, MOD A4C: 24.6 ml LV SV MOD A2C:     38.1 ml LV SV MOD A4C:     55.8 ml LV SV MOD BP:      34.9 ml IVC IVC diam: 2.10 cm  AORTA Ao Asc diam: 3.20 cm TRICUSPID VALVE TR Peak grad:   18.5 mmHg TR Vmax:        215.00 cm/s Stanly Leavens MD Electronically signed by Stanly Leavens MD Signature Date/Time: 07/05/2024/10:40:36 AM    Final    ECHOCARDIOGRAM LIMITED Result Date: 07/01/2024    ECHOCARDIOGRAM LIMITED REPORT   Patient Name:   Evelyn Baker Date of Exam: 07/01/2024 Medical Rec #:  [id number]       Height:       65.0 in Accession #:    [id number]      Weight:       193.2 lb Date of Birth:  01-06-1961       BSA:          1.949 m Patient Age:    63 years        BP:           129/78 mmHg Patient Gender: F               HR:           87 bpm. Exam Location:  Inpatient Procedure:  Limited Echo, Cardiac Doppler and Color Doppler (Both Spectral and            Color Flow Doppler were utilized during procedure). Indications:    Pericardial effusion I31.3  History:        Patient has prior history of Echocardiogram examinations, most                 recent 06/29/2024. Risk Factors:Hypertension.  Sonographer:    Jayson Gaskins Referring Phys: [id number] ARUN K THUKKANI IMPRESSIONS  1. Left ventricular ejection fraction, by estimation, is 60 to 65%. The left ventricle has normal function. The left ventricle has no regional wall motion abnormalities.  2. Right ventricular systolic function is normal. The right ventricular size is normal.  3. No evidence of RV diastolic collapse but there is clinically significant respiratory variation across the mitral and tricuspid valves which has worsened since prevoius echo. Additionally effusion is now larger. Discussed with interventional and exam and  echo not consistent with overt cardiac tamponade. Would repeat echo in the next 48-72 hours or clinical change. Large pericardial effusion.  4. The mitral valve is normal in structure. No evidence of mitral valve regurgitation. FINDINGS  Left Ventricle: Left ventricular ejection fraction, by estimation, is 60 to 65%. The left ventricle has normal function. The left ventricle has no regional wall motion abnormalities. The left ventricular internal cavity size was normal in size. There is  no left ventricular hypertrophy. Right Ventricle: The right ventricular size is normal. Right vetricular wall thickness was not well visualized. Right ventricular systolic function is normal. Pericardium: No evidence of RV diastolic collapse but there is clinically significant respiratory variation across the mitral and tricuspid valves which has worsened since prevoius echo. Additionally effusion is now larger. Discussed with interventional and exam and echo not consistent with overt cardiac tamponade. Would repeat echo in the next  48-72 hours or clinical change. A large pericardial effusion is present. Mitral Valve: The mitral valve is normal in structure. Tricuspid Valve: The tricuspid valve is normal in structure. Tricuspid valve regurgitation is not demonstrated. LEFT VENTRICLE PLAX 2D LVIDd:         4.10 cm LVIDs:         2.90 cm LV PW:         1.30 cm LV IVS:        1.00 cm LVOT diam:     1.85 cm LVOT Area:     2.69 cm  LEFT ATRIUM             Index LA Vol (A2C):   35.8 ml 18.37 ml/m LA Vol (A4C):   36.8 ml 18.88 ml/m LA Biplane Vol: 36.6 ml 18.78 ml/m   SHUNTS Systemic Diam: 1.85 cm Morene Brownie Electronically signed by Morene Brownie Signature Date/Time: 07/01/2024/11:03:15 AM    Final    ECHOCARDIOGRAM COMPLETE Result Date: 06/29/2024    ECHOCARDIOGRAM REPORT   Patient Name:   Evelyn Baker Date of Exam: 06/29/2024 Medical Rec #:  [id number]       Height:       65.0 in Accession #:    [id number]      Weight:       193.1 lb Date of Birth:  [birth year]       BSA:          1.949 m Patient Age:    63 years        BP:           136/80 mmHg Patient Gender: F               HR:           80 bpm. Exam Location:  Inpatient Procedure: 2D Echo, Cardiac Doppler and Color Doppler (Both Spectral and Color            Flow Doppler were utilized during procedure). Indications:    CHF - Acute Diastolic  History:        Patient has prior history of Echocardiogram examinations, most                 recent 03/17/2023. CHF, CAD, Signs/Symptoms:Edema, Shortness of                 Breath and Dyspnea; Risk Factors:Hypertension and Dyslipidemia.  Sonographer:    Juliene Rucks Referring Phys: [id number] JEFFREY T [facility]  Sonographer Comments: Patient is obese. IMPRESSIONS  1. Large pericardial effusion; IVC not dilated and no RV diastolic  collapse to suggest tamponade.  2. Left ventricular ejection fraction, by estimation, is 60 to 65%. The left ventricle has normal function. The left ventricle has no regional wall motion abnormalities. There is mild left  ventricular hypertrophy. Left ventricular diastolic parameters are consistent with Grade I diastolic dysfunction (impaired relaxation).  3. Right ventricular systolic function is normal. The right ventricular size is normal.  4. Large pericardial effusion. There is no evidence of cardiac tamponade.  5. The mitral valve is normal in structure. Trivial mitral valve regurgitation. No evidence of mitral stenosis.  6. The aortic valve is tricuspid. Aortic valve regurgitation is not visualized. No aortic stenosis is present.  7. The inferior vena cava is normal in size with greater than 50% respiratory variability, suggesting right atrial pressure of 3 mmHg. FINDINGS  Left Ventricle: Left ventricular ejection fraction, by estimation, is 60 to 65%. The left ventricle has normal function. The left ventricle has no regional wall motion abnormalities. The left ventricular internal cavity size was normal in size. There is  mild left ventricular hypertrophy. Left ventricular diastolic parameters are consistent with Grade I diastolic dysfunction (impaired relaxation). Right Ventricle: The right ventricular size is normal. Right ventricular systolic function is normal. Left Atrium: Left atrial size was normal in size. Right Atrium: Right atrial size was normal in size. Pericardium: A large pericardial effusion is present. There is no evidence of cardiac tamponade. Mitral Valve: The mitral valve is normal in structure. Trivial mitral valve regurgitation. No evidence of mitral valve stenosis. Tricuspid Valve: The tricuspid valve is normal in structure. Tricuspid valve regurgitation is trivial. No evidence of tricuspid stenosis. Aortic Valve: The aortic valve is tricuspid. Aortic valve regurgitation is not visualized. No aortic stenosis is present. Pulmonic Valve: The pulmonic valve was normal in structure. Pulmonic valve regurgitation is trivial. No evidence of pulmonic stenosis. Aorta: The aortic root is normal in size and  structure. Venous: The inferior vena cava is normal in size with greater than 50% respiratory variability, suggesting right atrial pressure of 3 mmHg. IAS/Shunts: No atrial level shunt detected by color flow Doppler. Additional Comments: Large pericardial effusion; IVC not dilated and no RV diastolic collapse to suggest tamponade.  LEFT VENTRICLE PLAX 2D LVIDd:         4.30 cm      Diastology LVIDs:         2.70 cm      LV e' medial:    6.85 cm/s LV PW:         1.20 cm      LV E/e' medial:  16.1 LV IVS:        1.10 cm      LV e' lateral:   6.64 cm/s LVOT diam:     1.90 cm      LV E/e' lateral: 16.6 LV SV:         59 LV SV Index:   30 LVOT Area:     2.84 cm  LV Volumes (MOD) LV vol d, MOD A2C: 128.0 ml LV vol d, MOD A4C: 106.0 ml LV vol s, MOD A2C: 61.3 ml LV vol s, MOD A4C: 45.8 ml LV SV MOD A2C:     66.7 ml LV SV MOD A4C:     106.0 ml LV SV MOD BP:      64.9 ml RIGHT VENTRICLE             IVC RV Basal diam:  2.90 cm     IVC diam: 2.00 cm  RV Mid diam:    2.20 cm RV S prime:     13.40 cm/s TAPSE (M-mode): 1.8 cm LEFT ATRIUM             Index        RIGHT ATRIUM           Index LA diam:        2.80 cm 1.44 cm/m   RA Area:     15.80 cm LA Vol (A2C):   47.6 ml 24.42 ml/m  RA Volume:   41.60 ml  21.34 ml/m LA Vol (A4C):   24.2 ml 12.42 ml/m LA Biplane Vol: 35.2 ml 18.06 ml/m  AORTIC VALVE LVOT Vmax:   107.00 cm/s LVOT Vmean:  72.100 cm/s LVOT VTI:    0.207 m  AORTA Ao Root diam: 2.90 cm Ao Asc diam:  2.80 cm MITRAL VALVE                TRICUSPID VALVE MV Area (PHT): 3.72 cm     TR Peak grad:   23.2 mmHg MV Decel Time: 204 msec     TR Vmax:        241.00 cm/s MV E velocity: 110.00 cm/s MV A velocity: 107.00 cm/s  SHUNTS MV E/A ratio:  1.03         Systemic VTI:  0.21 m                             Systemic Diam: 1.90 cm Redell Shallow MD Electronically signed by Redell Shallow MD Signature Date/Time: 06/29/2024/9:10:21 AM    Final    DG Chest 2 View Result Date: 06/27/2024 EXAM: 2 VIEW(S) XRAY OF THE CHEST  06/27/2024 03:49:00 PM COMPARISON: 06/05/2024 CLINICAL HISTORY: chest pain FINDINGS: LUNGS AND PLEURA: Mild pulmonary edema. New bilateral small pleural effusions with associated atelectasis. No pneumothorax. HEART AND MEDIASTINUM: Mild cardiomegaly. BONES AND SOFT TISSUES: No acute osseous abnormality. IMPRESSION: 1. Mild pulmonary edema with new bilateral small pleural effusions and associated atelectasis. 2. Mild cardiomegaly. Electronically signed by: Greig Pique MD 06/27/2024 06:05 PM EST RP Workstation: [id number]    Orders Placed This Encounter  Procedures   CBC with Differential ([facility] Only)    Standing Status:   Future    Expiration Date:   07/26/2025    Future Appointments  Date Time Provider Department [facility]  07/29/2024  8:30 AM Loreda Hacker, DPM [facility]  07/29/2024 11:30 AM Teresa Coulter E, RPH-CPP [facility]  08/08/2024  2:00 PM [facility]  08/22/2024  9:40 AM Tolia, Sunit, DO [facility]  08/23/2024 11:30 AM [facility] PHLEBOTOMIST [facility] None  08/23/2024 12:00 PM Rook Maue, Chinita, MD [facility] None  08/25/2024 11:00 AM Dolphus Reiter, MD [facility] None  11/15/2024 10:00 AM Hope Almarie ORN, NP [facility] [phone] [address]    I spent a total of 42 minutes during this encounter with the patient including review of chart and various tests results, discussions about plan of care and coordination of care plan.  This document was completed utilizing speech recognition software. Grammatical errors, random word insertions, pronoun errors, and incomplete sentences are an occasional consequence of this system due to software limitations, ambient noise, and hardware issues. Any formal questions or concerns about the content, text or information contained within the body of this dictation should be directly addressed to the provider for clarification.  "

## 2024-07-26 NOTE — Assessment & Plan Note (Signed)
 Patient is currently established with Dr. Dolphus.  C3-C4 remain low, SSA antibodies positive. Double-stranded DNA negative, Smith negative.   She was previously on Imuran , recently switched to CellCept  by Dr. Dolphus. Both agents carry comparable risk for cytopenias.  Fortunately her blood counts are all stable without evidence of leukopenia or thrombocytopenia and improved hemoglobin.  Will continue to monitor for now.

## 2024-07-26 NOTE — Assessment & Plan Note (Addendum)
 Chronic, persistent normocytic anemia with hemoglobin at 6.8 g/dL in December 2025, significantly below her baseline of 11-12 g/dL since December 7974. No evidence of active hemorrhage. Etiology is multifactorial, likely due to chronic kidney disease and underlying connective tissue disease.   Elevated erythropoietin  levels preclude further erythropoietin -stimulating agents. Current focus is on iron  repletion to optimize hemoglobin prior to biopsy.  She was given 2 doses of Feraheme in January 2026.  Hemoglobin has improved from 6.4-6.7 g/dL to 9.4 g/dL following two iron  infusions. No evidence of ongoing or internal bleeding after recent hemorrhoidal bleeding. White blood cell and platelet counts are stable. Both Cellcept  and Imuran  may contribute to cytopenias, but counts remain stable. Efforts continue to minimize iatrogenic blood loss from laboratory testing. - Reviewed hemoglobin (9.4 g/dL) and confirmed significant improvement.  - Advised continuation of oral iron  twice daily and folic acid  supplementation.  - Minimized frequency of blood draws; extended interval to every four weeks unless symptoms or concerns arise.  - Communicated with other providers to minimize blood volume drawn for labs, including use of pediatric tubes when possible.  - Deferred additional erythropoietin -stimulating agents due to elevated endogenous levels.  No evidence of ongoing hemolysis-LDH, haptoglobin were within normal limits and Coombs test negative on 06/14/2024.  She tolerates oral iron , though constipation is managed. No overt blood loss or symptoms of acute anemia.   - Advised monitoring for constipation; use Senokot as needed and MiraLAX daily if regular bowel movements are not achieved.  - Advised to report any signs of bleeding, including hematochezia, melena, or epistaxis.  - Ordered follow-up complete blood count at next visit in four weeks.

## 2024-07-27 ENCOUNTER — Encounter (HOSPITAL_COMMUNITY): Payer: Self-pay

## 2024-07-27 ENCOUNTER — Encounter: Payer: Self-pay | Admitting: Cardiology

## 2024-07-28 ENCOUNTER — Encounter (HOSPITAL_COMMUNITY): Payer: Self-pay

## 2024-07-28 NOTE — Telephone Encounter (Signed)
 Yes, refill   Dr. Michele

## 2024-07-29 ENCOUNTER — Ambulatory Visit: Attending: Internal Medicine

## 2024-07-29 ENCOUNTER — Other Ambulatory Visit (HOSPITAL_COMMUNITY): Payer: Self-pay

## 2024-07-29 ENCOUNTER — Encounter: Payer: Self-pay | Admitting: Podiatry

## 2024-07-29 ENCOUNTER — Ambulatory Visit: Admitting: Podiatry

## 2024-07-29 VITALS — BP 132/75 | HR 66

## 2024-07-29 DIAGNOSIS — B351 Tinea unguium: Secondary | ICD-10-CM

## 2024-07-29 DIAGNOSIS — M79675 Pain in left toe(s): Secondary | ICD-10-CM

## 2024-07-29 DIAGNOSIS — M79674 Pain in right toe(s): Secondary | ICD-10-CM | POA: Diagnosis not present

## 2024-07-29 DIAGNOSIS — I10 Essential (primary) hypertension: Secondary | ICD-10-CM | POA: Diagnosis present

## 2024-07-29 DIAGNOSIS — I872 Venous insufficiency (chronic) (peripheral): Secondary | ICD-10-CM

## 2024-07-29 MED ORDER — FUROSEMIDE 20 MG PO TABS
20.0000 mg | ORAL_TABLET | Freq: Every day | ORAL | 0 refills | Status: AC
  Filled 2024-07-29: qty 30, 30d supply, fill #0

## 2024-07-29 MED ORDER — ISOSORBIDE MONONITRATE ER 120 MG PO TB24
120.0000 mg | ORAL_TABLET | Freq: Every evening | ORAL | 0 refills | Status: AC
  Filled 2024-07-29 (×2): qty 30, 30d supply, fill #0

## 2024-07-29 NOTE — Progress Notes (Signed)
 Patient ID: Evelyn Baker                 DOB: [birth year]                      MRN: [id number]     HPI: Evelyn Baker is a 64 y.o. female referred by Glendia Ferrier, PA to pharmacy clinic for HF medication management. 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 Most recent LVEF 60-65% on 06/2024.  The patient was evaluated by cardiology two weeks ago after her recent hospitalization, during which she was diagnosed with a pericardial effusion and treated with prednisone  and colchicine . While inpatient, her blood pressure was well controlled on BiDil  and amlodipine . After discharge, amlodipine  was discontinued for unclear reasons, and her blood pressure has since been elevated. Her blood pressure at that visit was 182/90. She was advised to continue BiDil  20/37.5 mg three times daily and carvedilol  6.25 mg twice daily. A sleep study was ordered due to a STOPBANG score of at least 3. I spoke with Thom Sluder, PA who had the tentative plan to stop hydralazine  and put her on chlorthalidone  as long as renal function is still stable. Renal fxn is stable, Scr down from 1.56 to 0.9.  Patient more recently spoke with Dr. Michele a few days ago (07/26/2024) where he made the following changes and plan to check BMP in one week after these changes:  Stop BiDil  20/37.5 mg 2 tabs twice daily and Start Imdur  120mg  po qpm  Continue furosemide  20 mg p.o. every morning  Increase Coreg  12.5 mg po bid  Continue Norvasc  10 mg po qday  Restart valsartan  160 mg po bid   Patient presents today for PharmD visit. She is currently taking furosemide  20 mg daily, carvedilol  12.5 mg twice daily, amlodipine  10 mg daily, valsartan  160 mg twice daily for BP management. She states that she is about to run out of furosemide  and needs a refill. She also states that she was unable to pick up Imdur  a few days ago.   She presents with blood pressure log and monitor. She has two blood pressure monitors; Omron and Equate  brand. She has been using both monitors.  Omron monitor appears to be accurate; encouraged her to use this cuff to check blood pressure only. Question accuracy of blood pressure readings and second monitor as she has been using cord of Omron brand with Equate monitor.   Also, blood pressure at home largely different than in office recently (BP in office on 07/26/24 is 140/83 vs. home average ranging from 130s/80s to 175/100s.   She denies any SOB, chest pain, headache or palpitations. She reports some swelling in feet that is relieved when she elevates her legs. Able to complete all ADLs. Activity level normal. She does checks her weight at home (normal weight 187.6 - 188 lbs). No LEE, PND, or orthopnea. Appetite has been normal. She adheres to a low-salt diet.  Current CHF/HTN meds: isosorbide  mononitrate 120 mg daily, furosemide  20 mg daily, carvedilol  12.5 mg twice daily, amlodipine  10 mg daily, valsartan  160 mg twice daily   Previously tried: spironolactone  (GI upset)  Labs: (06/2024): Na 137, K 3.6, Cl 99, Scr 0.90, Crcl: 71 ml/min   BP goal: < 130/80  Family History:   Relation Problem Comments  Mother (Deceased) Heart failure   Kidney failure     Father (Deceased)   Sister (Deceased) Stroke  Sister (Deceased) Aneurysm     Sister (Deceased)   Sister Metallurgist)   Sister Metallurgist)   Brother (Alive) Heart Problems     Brother (Alive)   Brother (Deceased)   Brother (Deceased)   Brother (Deceased)   Brother (Deceased)   Daughter Metallurgist) Hypertension     Daughter Metallurgist)   Daughter Metallurgist)   Son Metallurgist) Hypertension     Social History:  Alcohol: none Smoking: none   Diet:Patient adhering to low salt diet and drinking four - 16 ounces bottles of water  Exercise:  Walking  Home BP readings: Readings over the past few days:   152/92 HR 79 148/90  HR 75 175/100  HR 81 133/87  HR 84  Wt Readings from Last 3 Encounters:  07/26/24 187 lb 3.2 oz (84.9 kg)  07/21/24  190 lb 6.4 oz (86.4 kg)  07/19/24 192 lb 9.6 oz (87.4 kg)   BP Readings from Last 3 Encounters:  07/29/24 132/75  07/26/24 (!) 140/83  07/22/24 (!) 162/96   Pulse Readings from Last 3 Encounters:  07/29/24 66  07/26/24 87  07/22/24 68    Renal function: Estimated Creatinine Clearance: 70.7 mL/min (by C-G formula based on SCr of 0.9 mg/dL).  Past Medical History:  Diagnosis Date   Asthma    Depression    Dyspnea    Hyperlipidemia    Hypertension    Hypothyroidism    Sciatic nerve disease    Thyroid  disease     Medications Ordered Prior to Encounter[1]  Allergies[2]   Assessment/Plan:  1. CHF -  Hypertension, unspecified type Assessment & Plan: Assessment: In-office BP today is 132/75 with HR 77. Systolic BP is slightly above the goal of <130/80. Home BP readings have been variable and significantly higher than todays office reading. Scr stable now (0.9) on 07/22/2024 The patient has been using multiple monitors, raising concerns about accuracy. I verified the accuracy of the Omron BP monitor and instructed the patient to use this device consistently for home readings. The patient tolerates her current regimen well and reports no side effects. Dr. Tolia adjusted medications on 07/26/24; the patient has not yet picked up Imdur . I will send the prescriptions for Imdur  and         furosemide  refill  Given the significantly improved office BP, likely inaccuracy of her second home monitor and recent changes made by Dr.        Michele, will defer any medication changes today. We may consider adding an SGLT2 inhibitor in the future for kidney and cardiovascular benefit, though the patient currently        feels overwhelmed by her medication burden. She denies shortness of breath, palpitations, chest pain, headaches, or swelling. Reinforced the importance of regular exercise and maintaining a low-salt diet.  Plan:  Furosemide  and Imdur  rx sent to pharmacy Continue taking  isosorbide  mononitrate 120 mg daily, furosemide  20 mg daily, carvedilol  12.5 mg twice daily, amlodipine  10 mg daily, valsartan  160 mg twice daily  The patient will record home BP and heart rate readings and bring the log to the next visit. Plan to follow up in 3-4 weeks, but patient also has a provider appointment around that time - I will follow up as needed after her visit with Dr. Michele. Follow-up labs: BMP in one week; printed lab order provided to the patient.   Other orders -     Isosorbide  Mononitrate ER; Take 1 tablet (120 mg total) by mouth at bedtime.  Dispense:  30 tablet; Refill: 0 -     Furosemide ; Take 1 tablet (20 mg total) by mouth daily.  Dispense: 30 tablet; Refill: 0    Thank you   Jalynn Betzold E. Biruk Troia, Pharm.D, CPP [facility]. [facility] [address], [place], [place] [address] Phone: [phone]; Fax: [phone]      [1]  Current Outpatient Medications on File Prior to Visit  Medication Sig Dispense Refill   acetaminophen  (TYLENOL ) 500 MG tablet Take 2 tablets (1,000 mg total) by mouth every 8 (eight) hours as needed. 30 tablet 0   albuterol  (VENTOLIN  HFA) 108 (90 Base) MCG/ACT inhaler Inhale 2 puffs into the lungs every 4 (four) hours as needed for wheezing or shortness of breath.     amLODipine  (NORVASC ) 10 MG tablet Take 1 tablet (10 mg total) by mouth daily. 90 tablet 3   apixaban  (ELIQUIS ) 5 MG TABS tablet Take 1 tablet (5 mg total) by mouth 2 (two) times daily. 60 tablet 3   Ascorbic Acid (VITAMIN C) 100 MG tablet Take 100 mg by mouth daily.     budesonide -formoterol  (SYMBICORT ) 160-4.5 MCG/ACT inhaler Inhale 2 puffs into the lungs 2 (two) times daily. 10.2 g 12   carvedilol  (COREG ) 12.5 MG tablet Take 1 tablet (12.5 mg total) by mouth 2 (two) times daily. 60 tablet 0   colchicine  0.6 MG tablet Take 1 tablet (0.6 mg total) by mouth 2 (two) times daily. 60 tablet 0   cyanocobalamin  1000 MCG tablet Take 1 tablet  (1,000 mcg total) by mouth daily. 30 tablet 0   EPINEPHrine  0.3 mg/0.3 mL IJ SOAJ injection Inject 0.3 mg into the muscle as needed as directed. 2 each 1   ferrous sulfate  325 (65 FE) MG tablet Take 1 tablet (325 mg total) by mouth 3 (three) times daily with meals. 100 tablet 0   folic acid  (FOLVITE ) 1 MG tablet Take 1 tablet (1 mg total) by mouth daily. 30 tablet 0   levothyroxine  (SYNTHROID ) 112 MCG tablet Take 1 tablet (112 mcg total) by mouth in the morning on an empty stomach. 30 tablet 5   mycophenolate  (CELLCEPT ) 500 MG tablet Take 1 tablet by mouth twice daily for 2 weeks, if labs are normal then increase to 2 tablets by mouth twice daily. 84 tablet 0   potassium chloride  (MICRO-K ) 10 MEQ CR capsule Take 1 capsule (10 mEq total) by mouth 2 (two) times daily. 180 capsule 2   predniSONE  (DELTASONE ) 10 MG tablet Take 4 tablets (40 mg total) by mouth daily with breakfast for 14 days, THEN 3 tablets (30 mg total) daily with breakfast for 14 days, THEN 2 tablets (20 mg total) daily with breakfast for 14 days, THEN 1 tablet (10 mg total) daily with breakfast for 14 days. then stop. 150 tablet 0   rosuvastatin  (CRESTOR ) 10 MG tablet Take 1 tablet (10 mg total) by mouth daily. 90 tablet 3   valsartan  (DIOVAN ) 160 MG tablet Take 1 tablet (160 mg total) by mouth 2 (two) times daily. 60 tablet 0   No current facility-administered medications on file prior to visit.  [2]  Allergies Allergen Reactions   Shellfish Allergy Anaphylaxis and Swelling   Avocado Swelling    Lips   Codeine  Other (See Comments)    Constipation   Iodinated Contrast Media Other (See Comments)   Mushroom Other (See Comments)    Itchy throat   Pineapple Swelling    Lip swelling  Penicillins Rash    Reaction: Childhood

## 2024-07-29 NOTE — Patient Instructions (Addendum)
 Changes made by your pharmacist Tymika Grilli E. Kayti Poss, PharmD, CPP at today's visit:    Instructions/Changes  (what do you need to do) Your Notes  (what you did and when you did it)  Continue isosorbide  mononitrate 120 mg at bedtime, furosemide  20 mg daily in the morning, carvedilol  12.5 mg twice daily, amlodipine  10 mg daily, valsartan  160 mg twice daily    2. Take blood pressure twice per day: take      once in the morning before medications      and then again, 2 hours after blood      pressure medications; Record this is      blood pressure log   3. Appointment with Dr. Michele 08/22/24    Bring all of your meds, your BP cuff and your record of home blood pressures to your next appointment.   Britt Theard E. Tahari Clabaugh, Pharm.D, CPP [place] [facility]. [facility] [address], [place], [place] [address] Phone: [phone]; Fax: [phone]     HOW TO TAKE YOUR BLOOD PRESSURE AT HOME  Rest 5 minutes before taking your blood pressure.  Dont smoke or drink caffeinated beverages for at least 30 minutes before. Take your blood pressure before (not after) you eat. Sit comfortably with your back supported and both feet on the floor (dont cross your legs). Elevate your arm to heart level on a table or a desk. Use the proper sized cuff. It should fit smoothly and snugly around your bare upper arm. There should be enough room to slip a fingertip under the cuff. The bottom edge of the cuff should be 1 inch above the crease of the elbow. Ideally, take 3 measurements at one sitting and record the average.  Important lifestyle changes to control high blood pressure  Intervention  Effect on the BP  Lose extra pounds and watch your waistline Weight loss is one of the most effective lifestyle changes for controlling blood pressure. If you're overweight or obese, losing even a small amount of weight can help reduce blood pressure. Blood pressure might go down by about 1  millimeter of mercury (mm Hg) with each kilogram (about 2.2 pounds) of weight lost.  Exercise regularly As a general goal, aim for at least 30 minutes of moderate physical activity every day. Regular physical activity can lower high blood pressure by about 5 to 8 mm Hg.  Eat a healthy diet Eating a diet rich in whole grains, fruits, vegetables, and low-fat dairy products and low in saturated fat and cholesterol. A healthy diet can lower high blood pressure by up to 11 mm Hg.  Reduce salt (sodium) in your diet Even a small reduction of sodium in the diet can improve heart health and reduce high blood pressure by about 5 to 6 mm Hg.  Limit alcohol One drink equals 12 ounces of beer, 5 ounces of wine, or 1.5 ounces of 80-proof liquor.  Limiting alcohol to less than one drink a day for women or two drinks a day for men can help lower blood pressure by about 4 mm Hg.   If you have any questions or concerns please use My Chart to send questions or call the office at [phone]

## 2024-07-29 NOTE — Assessment & Plan Note (Addendum)
 Assessment: In-office BP today is 132/75 with HR 77. Systolic BP is slightly above the goal of <130/80. Home BP readings have been variable and significantly higher than todays office reading. Scr stable now (0.9) on 07/22/2024 The patient has been using multiple monitors, raising concerns about accuracy. I verified the accuracy of the [employer] BP monitor and instructed the patient to use this device consistently for home readings. The patient tolerates her current regimen well and reports no side effects. Dr. Tolia adjusted medications on 07/26/24; the patient has not yet picked up Imdur . I will send the prescriptions for Imdur  and         furosemide  refill  Given the significantly improved office BP, likely inaccuracy of her second home monitor and recent changes made by Dr.        Michele, will defer any medication changes today. We may consider adding an SGLT2 inhibitor in the future for kidney and cardiovascular benefit, though the patient currently        feels overwhelmed by her medication burden. She denies shortness of breath, palpitations, chest pain, headaches, or swelling. Reinforced the importance of regular exercise and maintaining a low-salt diet.  Plan:  Furosemide  and Imdur  rx sent to pharmacy Continue taking isosorbide  mononitrate 120 mg daily, furosemide  20 mg daily, carvedilol  12.5 mg twice daily, amlodipine  10 mg daily, valsartan  160 mg twice daily  The patient will record home BP and heart rate readings and bring the log to the next visit. Plan to follow up in 3-4 weeks, but patient also has a provider appointment around that time - I will follow up as needed after her visit with Dr. Michele. Follow-up labs: BMP in one week; printed lab order provided to the patient.

## 2024-07-29 NOTE — Progress Notes (Signed)

## 2024-07-30 ENCOUNTER — Other Ambulatory Visit (HOSPITAL_COMMUNITY): Payer: Self-pay

## 2024-08-02 ENCOUNTER — Other Ambulatory Visit (HOSPITAL_COMMUNITY): Payer: Self-pay

## 2024-08-05 ENCOUNTER — Other Ambulatory Visit: Payer: Self-pay | Admitting: *Deleted

## 2024-08-05 DIAGNOSIS — Z79899 Other long term (current) drug therapy: Secondary | ICD-10-CM

## 2024-08-05 LAB — BASIC METABOLIC PANEL WITH GFR
Chloride: 99 mmol/L (ref 96–106)
Glucose: 89 mg/dL (ref 70–99)
Sodium: 138 mmol/L (ref 134–144)

## 2024-08-08 ENCOUNTER — Ambulatory Visit (HOSPITAL_COMMUNITY): Admission: RE | Admit: 2024-08-08

## 2024-08-22 ENCOUNTER — Ambulatory Visit: Admitting: Cardiology

## 2024-08-23 ENCOUNTER — Inpatient Hospital Stay: Admitting: Oncology

## 2024-08-23 ENCOUNTER — Inpatient Hospital Stay

## 2024-08-25 ENCOUNTER — Ambulatory Visit: Admitting: Rheumatology

## 2024-11-15 ENCOUNTER — Encounter: Admitting: Primary Care

## 2024-11-25 ENCOUNTER — Ambulatory Visit: Admitting: Podiatry
